# Patient Record
Sex: Female | Born: 1945
Health system: Southern US, Community
[De-identification: ages and names within clinical notes are randomized; demographics above are authoritative.]

## PROBLEM LIST (undated history)

## (undated) DIAGNOSIS — M51369 Other intervertebral disc degeneration, lumbar region without mention of lumbar back pain or lower extremity pain: Secondary | ICD-10-CM

## (undated) DIAGNOSIS — G8929 Other chronic pain: Secondary | ICD-10-CM

## (undated) DIAGNOSIS — E079 Disorder of thyroid, unspecified: Secondary | ICD-10-CM

## (undated) DIAGNOSIS — IMO0002 Reserved for concepts with insufficient information to code with codable children: Secondary | ICD-10-CM

## (undated) DIAGNOSIS — M25561 Pain in right knee: Secondary | ICD-10-CM

## (undated) DIAGNOSIS — M549 Dorsalgia, unspecified: Secondary | ICD-10-CM

## (undated) DIAGNOSIS — R269 Unspecified abnormalities of gait and mobility: Secondary | ICD-10-CM

## (undated) DIAGNOSIS — C50919 Malignant neoplasm of unspecified site of unspecified female breast: Secondary | ICD-10-CM

## (undated) DIAGNOSIS — G20A1 Parkinson's disease without dyskinesia, without mention of fluctuations: Secondary | ICD-10-CM

## (undated) DIAGNOSIS — M79606 Pain in leg, unspecified: Secondary | ICD-10-CM

## (undated) DIAGNOSIS — M5136 Other intervertebral disc degeneration, lumbar region: Secondary | ICD-10-CM

## (undated) DIAGNOSIS — E039 Hypothyroidism, unspecified: Secondary | ICD-10-CM

## (undated) DIAGNOSIS — M5030 Other cervical disc degeneration, unspecified cervical region: Secondary | ICD-10-CM

## (undated) DIAGNOSIS — K219 Gastro-esophageal reflux disease without esophagitis: Secondary | ICD-10-CM

## (undated) DIAGNOSIS — D72819 Decreased white blood cell count, unspecified: Secondary | ICD-10-CM

## (undated) DIAGNOSIS — G20 Parkinson's disease: Secondary | ICD-10-CM

## (undated) DIAGNOSIS — I10 Essential (primary) hypertension: Secondary | ICD-10-CM

## (undated) HISTORY — DX: Malignant neoplasm of unspecified site of unspecified female breast: C50.919

## (undated) HISTORY — DX: Reserved for concepts with insufficient information to code with codable children: IMO0002

## (undated) HISTORY — PX: CHOLECYSTECTOMY: SHX55

## (undated) HISTORY — PX: TUBAL LIGATION: SHX77

## (undated) HISTORY — DX: Disorder of thyroid, unspecified: E07.9

## (undated) HISTORY — DX: Gastro-esophageal reflux disease without esophagitis: K21.9

## (undated) HISTORY — PX: ABDOMINAL HYSTERECTOMY: SHX81

## (undated) HISTORY — DX: Essential (primary) hypertension: I10

---

## 2001-01-22 ENCOUNTER — Ambulatory Visit (HOSPITAL_COMMUNITY): Admission: RE | Admit: 2001-01-22 | Discharge: 2001-01-22 | Payer: Self-pay | Admitting: Endocrinology

## 2001-01-28 ENCOUNTER — Ambulatory Visit (HOSPITAL_COMMUNITY): Admission: RE | Admit: 2001-01-28 | Discharge: 2001-01-28 | Payer: Self-pay | Admitting: Endocrinology

## 2001-09-04 ENCOUNTER — Ambulatory Visit (HOSPITAL_COMMUNITY): Admission: RE | Admit: 2001-09-04 | Discharge: 2001-09-04 | Payer: Self-pay | Admitting: Endocrinology

## 2001-11-20 ENCOUNTER — Ambulatory Visit (HOSPITAL_COMMUNITY): Admission: RE | Admit: 2001-11-20 | Discharge: 2001-11-20 | Payer: Self-pay | Admitting: Endocrinology

## 2004-04-27 ENCOUNTER — Ambulatory Visit: Payer: Self-pay | Admitting: Internal Medicine

## 2004-04-27 ENCOUNTER — Ambulatory Visit (HOSPITAL_COMMUNITY): Admission: RE | Admit: 2004-04-27 | Discharge: 2004-04-27 | Payer: Self-pay | Admitting: Internal Medicine

## 2005-03-28 ENCOUNTER — Ambulatory Visit (HOSPITAL_COMMUNITY): Admission: RE | Admit: 2005-03-28 | Discharge: 2005-03-28 | Payer: Self-pay | Admitting: Oncology

## 2005-03-28 ENCOUNTER — Ambulatory Visit: Payer: Self-pay | Admitting: Oncology

## 2006-08-10 ENCOUNTER — Emergency Department (HOSPITAL_COMMUNITY): Admission: EM | Admit: 2006-08-10 | Discharge: 2006-08-10 | Payer: Self-pay | Admitting: Emergency Medicine

## 2011-08-21 DIAGNOSIS — E039 Hypothyroidism, unspecified: Secondary | ICD-10-CM | POA: Diagnosis not present

## 2011-08-29 DIAGNOSIS — K5909 Other constipation: Secondary | ICD-10-CM | POA: Diagnosis not present

## 2011-08-29 DIAGNOSIS — N3000 Acute cystitis without hematuria: Secondary | ICD-10-CM | POA: Diagnosis not present

## 2011-08-29 DIAGNOSIS — E039 Hypothyroidism, unspecified: Secondary | ICD-10-CM | POA: Diagnosis not present

## 2011-08-29 DIAGNOSIS — N3090 Cystitis, unspecified without hematuria: Secondary | ICD-10-CM | POA: Diagnosis not present

## 2011-08-29 DIAGNOSIS — I10 Essential (primary) hypertension: Secondary | ICD-10-CM | POA: Diagnosis not present

## 2011-08-29 DIAGNOSIS — M1990 Unspecified osteoarthritis, unspecified site: Secondary | ICD-10-CM | POA: Diagnosis not present

## 2011-10-15 DIAGNOSIS — H31019 Macula scars of posterior pole (postinflammatory) (post-traumatic), unspecified eye: Secondary | ICD-10-CM | POA: Diagnosis not present

## 2011-11-16 DIAGNOSIS — C50919 Malignant neoplasm of unspecified site of unspecified female breast: Secondary | ICD-10-CM

## 2011-11-16 HISTORY — DX: Malignant neoplasm of unspecified site of unspecified female breast: C50.919

## 2011-12-16 HISTORY — PX: BREAST LUMPECTOMY: SHX2

## 2011-12-18 DIAGNOSIS — B353 Tinea pedis: Secondary | ICD-10-CM | POA: Diagnosis not present

## 2011-12-18 DIAGNOSIS — B351 Tinea unguium: Secondary | ICD-10-CM | POA: Diagnosis not present

## 2011-12-24 DIAGNOSIS — C801 Malignant (primary) neoplasm, unspecified: Secondary | ICD-10-CM | POA: Diagnosis not present

## 2011-12-24 DIAGNOSIS — C50919 Malignant neoplasm of unspecified site of unspecified female breast: Secondary | ICD-10-CM | POA: Diagnosis not present

## 2011-12-24 DIAGNOSIS — R928 Other abnormal and inconclusive findings on diagnostic imaging of breast: Secondary | ICD-10-CM | POA: Diagnosis not present

## 2011-12-24 DIAGNOSIS — D0590 Unspecified type of carcinoma in situ of unspecified breast: Secondary | ICD-10-CM | POA: Diagnosis not present

## 2011-12-24 DIAGNOSIS — N63 Unspecified lump in unspecified breast: Secondary | ICD-10-CM | POA: Diagnosis not present

## 2012-01-06 DIAGNOSIS — C50919 Malignant neoplasm of unspecified site of unspecified female breast: Secondary | ICD-10-CM | POA: Diagnosis not present

## 2012-01-07 DIAGNOSIS — C801 Malignant (primary) neoplasm, unspecified: Secondary | ICD-10-CM | POA: Diagnosis not present

## 2012-01-07 DIAGNOSIS — C50919 Malignant neoplasm of unspecified site of unspecified female breast: Secondary | ICD-10-CM | POA: Diagnosis not present

## 2012-01-07 DIAGNOSIS — D0590 Unspecified type of carcinoma in situ of unspecified breast: Secondary | ICD-10-CM | POA: Diagnosis not present

## 2012-01-07 DIAGNOSIS — I10 Essential (primary) hypertension: Secondary | ICD-10-CM | POA: Diagnosis not present

## 2012-01-07 DIAGNOSIS — K219 Gastro-esophageal reflux disease without esophagitis: Secondary | ICD-10-CM | POA: Diagnosis not present

## 2012-01-31 DIAGNOSIS — N390 Urinary tract infection, site not specified: Secondary | ICD-10-CM | POA: Diagnosis not present

## 2012-01-31 DIAGNOSIS — R0789 Other chest pain: Secondary | ICD-10-CM | POA: Diagnosis not present

## 2012-01-31 DIAGNOSIS — E039 Hypothyroidism, unspecified: Secondary | ICD-10-CM | POA: Diagnosis not present

## 2012-01-31 DIAGNOSIS — R079 Chest pain, unspecified: Secondary | ICD-10-CM

## 2012-01-31 DIAGNOSIS — Z87891 Personal history of nicotine dependence: Secondary | ICD-10-CM | POA: Diagnosis not present

## 2012-01-31 DIAGNOSIS — R0989 Other specified symptoms and signs involving the circulatory and respiratory systems: Secondary | ICD-10-CM | POA: Diagnosis not present

## 2012-01-31 DIAGNOSIS — C50919 Malignant neoplasm of unspecified site of unspecified female breast: Secondary | ICD-10-CM | POA: Diagnosis not present

## 2012-01-31 DIAGNOSIS — R109 Unspecified abdominal pain: Secondary | ICD-10-CM | POA: Diagnosis not present

## 2012-01-31 DIAGNOSIS — I10 Essential (primary) hypertension: Secondary | ICD-10-CM | POA: Diagnosis not present

## 2012-01-31 DIAGNOSIS — R112 Nausea with vomiting, unspecified: Secondary | ICD-10-CM | POA: Diagnosis not present

## 2012-01-31 DIAGNOSIS — J811 Chronic pulmonary edema: Secondary | ICD-10-CM | POA: Diagnosis not present

## 2012-01-31 DIAGNOSIS — K219 Gastro-esophageal reflux disease without esophagitis: Secondary | ICD-10-CM | POA: Diagnosis not present

## 2012-01-31 DIAGNOSIS — Z79899 Other long term (current) drug therapy: Secondary | ICD-10-CM | POA: Diagnosis not present

## 2012-02-01 DIAGNOSIS — R079 Chest pain, unspecified: Secondary | ICD-10-CM | POA: Diagnosis not present

## 2012-02-07 DIAGNOSIS — R079 Chest pain, unspecified: Secondary | ICD-10-CM | POA: Diagnosis not present

## 2012-02-07 DIAGNOSIS — R072 Precordial pain: Secondary | ICD-10-CM | POA: Diagnosis not present

## 2012-02-12 DIAGNOSIS — R0789 Other chest pain: Secondary | ICD-10-CM | POA: Diagnosis not present

## 2012-02-14 ENCOUNTER — Encounter: Payer: Medicare Other | Admitting: Hematology and Oncology

## 2012-02-14 DIAGNOSIS — E039 Hypothyroidism, unspecified: Secondary | ICD-10-CM | POA: Diagnosis not present

## 2012-02-14 DIAGNOSIS — C50919 Malignant neoplasm of unspecified site of unspecified female breast: Secondary | ICD-10-CM | POA: Diagnosis not present

## 2012-02-14 DIAGNOSIS — I10 Essential (primary) hypertension: Secondary | ICD-10-CM | POA: Diagnosis not present

## 2012-02-19 DIAGNOSIS — I10 Essential (primary) hypertension: Secondary | ICD-10-CM | POA: Diagnosis not present

## 2012-02-19 DIAGNOSIS — E039 Hypothyroidism, unspecified: Secondary | ICD-10-CM | POA: Diagnosis not present

## 2012-02-20 DIAGNOSIS — Z87891 Personal history of nicotine dependence: Secondary | ICD-10-CM | POA: Diagnosis not present

## 2012-02-20 DIAGNOSIS — Z13820 Encounter for screening for osteoporosis: Secondary | ICD-10-CM | POA: Diagnosis not present

## 2012-02-20 DIAGNOSIS — Z803 Family history of malignant neoplasm of breast: Secondary | ICD-10-CM | POA: Diagnosis not present

## 2012-02-20 DIAGNOSIS — C50219 Malignant neoplasm of upper-inner quadrant of unspecified female breast: Secondary | ICD-10-CM | POA: Diagnosis not present

## 2012-02-20 DIAGNOSIS — Z170 Estrogen receptor positive status [ER+]: Secondary | ICD-10-CM | POA: Diagnosis not present

## 2012-02-20 DIAGNOSIS — Z510 Encounter for antineoplastic radiation therapy: Secondary | ICD-10-CM | POA: Diagnosis not present

## 2012-02-20 DIAGNOSIS — Z800 Family history of malignant neoplasm of digestive organs: Secondary | ICD-10-CM | POA: Diagnosis not present

## 2012-02-20 DIAGNOSIS — E079 Disorder of thyroid, unspecified: Secondary | ICD-10-CM | POA: Diagnosis not present

## 2012-02-20 DIAGNOSIS — I10 Essential (primary) hypertension: Secondary | ICD-10-CM | POA: Diagnosis not present

## 2012-02-20 DIAGNOSIS — Z79899 Other long term (current) drug therapy: Secondary | ICD-10-CM | POA: Diagnosis not present

## 2012-02-20 DIAGNOSIS — Z780 Asymptomatic menopausal state: Secondary | ICD-10-CM | POA: Diagnosis not present

## 2012-02-20 DIAGNOSIS — Z9889 Other specified postprocedural states: Secondary | ICD-10-CM | POA: Diagnosis not present

## 2012-02-24 DIAGNOSIS — I10 Essential (primary) hypertension: Secondary | ICD-10-CM | POA: Diagnosis not present

## 2012-02-24 DIAGNOSIS — Z780 Asymptomatic menopausal state: Secondary | ICD-10-CM | POA: Diagnosis not present

## 2012-02-24 DIAGNOSIS — C50219 Malignant neoplasm of upper-inner quadrant of unspecified female breast: Secondary | ICD-10-CM | POA: Diagnosis not present

## 2012-02-24 DIAGNOSIS — Z170 Estrogen receptor positive status [ER+]: Secondary | ICD-10-CM | POA: Diagnosis not present

## 2012-02-24 DIAGNOSIS — Z510 Encounter for antineoplastic radiation therapy: Secondary | ICD-10-CM | POA: Diagnosis not present

## 2012-02-24 DIAGNOSIS — Z13820 Encounter for screening for osteoporosis: Secondary | ICD-10-CM | POA: Diagnosis not present

## 2012-02-28 DIAGNOSIS — C50919 Malignant neoplasm of unspecified site of unspecified female breast: Secondary | ICD-10-CM | POA: Diagnosis not present

## 2012-02-28 DIAGNOSIS — I10 Essential (primary) hypertension: Secondary | ICD-10-CM | POA: Diagnosis not present

## 2012-02-28 DIAGNOSIS — Z23 Encounter for immunization: Secondary | ICD-10-CM | POA: Diagnosis not present

## 2012-02-28 DIAGNOSIS — M545 Low back pain: Secondary | ICD-10-CM | POA: Diagnosis not present

## 2012-02-28 DIAGNOSIS — K5909 Other constipation: Secondary | ICD-10-CM | POA: Diagnosis not present

## 2012-02-28 DIAGNOSIS — E039 Hypothyroidism, unspecified: Secondary | ICD-10-CM | POA: Diagnosis not present

## 2012-02-28 DIAGNOSIS — R0789 Other chest pain: Secondary | ICD-10-CM | POA: Diagnosis not present

## 2012-03-02 DIAGNOSIS — Z13820 Encounter for screening for osteoporosis: Secondary | ICD-10-CM | POA: Diagnosis not present

## 2012-03-02 DIAGNOSIS — Z510 Encounter for antineoplastic radiation therapy: Secondary | ICD-10-CM | POA: Diagnosis not present

## 2012-03-02 DIAGNOSIS — C50219 Malignant neoplasm of upper-inner quadrant of unspecified female breast: Secondary | ICD-10-CM | POA: Diagnosis not present

## 2012-03-02 DIAGNOSIS — I10 Essential (primary) hypertension: Secondary | ICD-10-CM | POA: Diagnosis not present

## 2012-03-02 DIAGNOSIS — Z780 Asymptomatic menopausal state: Secondary | ICD-10-CM | POA: Diagnosis not present

## 2012-03-02 DIAGNOSIS — Z170 Estrogen receptor positive status [ER+]: Secondary | ICD-10-CM | POA: Diagnosis not present

## 2012-03-09 DIAGNOSIS — C50219 Malignant neoplasm of upper-inner quadrant of unspecified female breast: Secondary | ICD-10-CM | POA: Diagnosis not present

## 2012-03-09 DIAGNOSIS — Z780 Asymptomatic menopausal state: Secondary | ICD-10-CM | POA: Diagnosis not present

## 2012-03-09 DIAGNOSIS — Z170 Estrogen receptor positive status [ER+]: Secondary | ICD-10-CM | POA: Diagnosis not present

## 2012-03-09 DIAGNOSIS — I10 Essential (primary) hypertension: Secondary | ICD-10-CM | POA: Diagnosis not present

## 2012-03-09 DIAGNOSIS — Z510 Encounter for antineoplastic radiation therapy: Secondary | ICD-10-CM | POA: Diagnosis not present

## 2012-03-09 DIAGNOSIS — Z13820 Encounter for screening for osteoporosis: Secondary | ICD-10-CM | POA: Diagnosis not present

## 2012-03-10 ENCOUNTER — Encounter: Payer: Medicare Other | Admitting: Hematology and Oncology

## 2012-03-10 DIAGNOSIS — E039 Hypothyroidism, unspecified: Secondary | ICD-10-CM

## 2012-03-10 DIAGNOSIS — I10 Essential (primary) hypertension: Secondary | ICD-10-CM | POA: Diagnosis not present

## 2012-03-10 DIAGNOSIS — Z170 Estrogen receptor positive status [ER+]: Secondary | ICD-10-CM | POA: Diagnosis not present

## 2012-03-10 DIAGNOSIS — Z510 Encounter for antineoplastic radiation therapy: Secondary | ICD-10-CM | POA: Diagnosis not present

## 2012-03-10 DIAGNOSIS — C50919 Malignant neoplasm of unspecified site of unspecified female breast: Secondary | ICD-10-CM | POA: Diagnosis not present

## 2012-03-10 DIAGNOSIS — Z780 Asymptomatic menopausal state: Secondary | ICD-10-CM | POA: Diagnosis not present

## 2012-03-10 DIAGNOSIS — C50219 Malignant neoplasm of upper-inner quadrant of unspecified female breast: Secondary | ICD-10-CM | POA: Diagnosis not present

## 2012-03-10 DIAGNOSIS — Z13820 Encounter for screening for osteoporosis: Secondary | ICD-10-CM | POA: Diagnosis not present

## 2012-03-11 DIAGNOSIS — Z170 Estrogen receptor positive status [ER+]: Secondary | ICD-10-CM | POA: Diagnosis not present

## 2012-03-11 DIAGNOSIS — I10 Essential (primary) hypertension: Secondary | ICD-10-CM | POA: Diagnosis not present

## 2012-03-11 DIAGNOSIS — Z510 Encounter for antineoplastic radiation therapy: Secondary | ICD-10-CM | POA: Diagnosis not present

## 2012-03-11 DIAGNOSIS — Z780 Asymptomatic menopausal state: Secondary | ICD-10-CM | POA: Diagnosis not present

## 2012-03-11 DIAGNOSIS — Z13820 Encounter for screening for osteoporosis: Secondary | ICD-10-CM | POA: Diagnosis not present

## 2012-03-11 DIAGNOSIS — C50219 Malignant neoplasm of upper-inner quadrant of unspecified female breast: Secondary | ICD-10-CM | POA: Diagnosis not present

## 2012-03-12 DIAGNOSIS — Z780 Asymptomatic menopausal state: Secondary | ICD-10-CM | POA: Diagnosis not present

## 2012-03-12 DIAGNOSIS — C50219 Malignant neoplasm of upper-inner quadrant of unspecified female breast: Secondary | ICD-10-CM | POA: Diagnosis not present

## 2012-03-12 DIAGNOSIS — I10 Essential (primary) hypertension: Secondary | ICD-10-CM | POA: Diagnosis not present

## 2012-03-12 DIAGNOSIS — Z13820 Encounter for screening for osteoporosis: Secondary | ICD-10-CM | POA: Diagnosis not present

## 2012-03-12 DIAGNOSIS — Z510 Encounter for antineoplastic radiation therapy: Secondary | ICD-10-CM | POA: Diagnosis not present

## 2012-03-12 DIAGNOSIS — Z170 Estrogen receptor positive status [ER+]: Secondary | ICD-10-CM | POA: Diagnosis not present

## 2012-03-13 DIAGNOSIS — C50219 Malignant neoplasm of upper-inner quadrant of unspecified female breast: Secondary | ICD-10-CM | POA: Diagnosis not present

## 2012-03-13 DIAGNOSIS — I10 Essential (primary) hypertension: Secondary | ICD-10-CM | POA: Diagnosis not present

## 2012-03-13 DIAGNOSIS — Z170 Estrogen receptor positive status [ER+]: Secondary | ICD-10-CM | POA: Diagnosis not present

## 2012-03-13 DIAGNOSIS — Z780 Asymptomatic menopausal state: Secondary | ICD-10-CM | POA: Diagnosis not present

## 2012-03-13 DIAGNOSIS — Z13820 Encounter for screening for osteoporosis: Secondary | ICD-10-CM | POA: Diagnosis not present

## 2012-03-13 DIAGNOSIS — Z510 Encounter for antineoplastic radiation therapy: Secondary | ICD-10-CM | POA: Diagnosis not present

## 2012-03-16 DIAGNOSIS — Z13820 Encounter for screening for osteoporosis: Secondary | ICD-10-CM | POA: Diagnosis not present

## 2012-03-16 DIAGNOSIS — Z510 Encounter for antineoplastic radiation therapy: Secondary | ICD-10-CM | POA: Diagnosis not present

## 2012-03-16 DIAGNOSIS — I10 Essential (primary) hypertension: Secondary | ICD-10-CM | POA: Diagnosis not present

## 2012-03-16 DIAGNOSIS — C50219 Malignant neoplasm of upper-inner quadrant of unspecified female breast: Secondary | ICD-10-CM | POA: Diagnosis not present

## 2012-03-16 DIAGNOSIS — Z170 Estrogen receptor positive status [ER+]: Secondary | ICD-10-CM | POA: Diagnosis not present

## 2012-03-16 DIAGNOSIS — Z780 Asymptomatic menopausal state: Secondary | ICD-10-CM | POA: Diagnosis not present

## 2012-03-17 DIAGNOSIS — Z510 Encounter for antineoplastic radiation therapy: Secondary | ICD-10-CM | POA: Diagnosis not present

## 2012-03-17 DIAGNOSIS — C50219 Malignant neoplasm of upper-inner quadrant of unspecified female breast: Secondary | ICD-10-CM | POA: Diagnosis not present

## 2012-03-25 DIAGNOSIS — C50219 Malignant neoplasm of upper-inner quadrant of unspecified female breast: Secondary | ICD-10-CM | POA: Diagnosis not present

## 2012-04-01 DIAGNOSIS — C50219 Malignant neoplasm of upper-inner quadrant of unspecified female breast: Secondary | ICD-10-CM | POA: Diagnosis not present

## 2012-04-08 DIAGNOSIS — C50219 Malignant neoplasm of upper-inner quadrant of unspecified female breast: Secondary | ICD-10-CM | POA: Diagnosis not present

## 2012-04-16 DIAGNOSIS — C50219 Malignant neoplasm of upper-inner quadrant of unspecified female breast: Secondary | ICD-10-CM | POA: Diagnosis not present

## 2012-04-17 DIAGNOSIS — R42 Dizziness and giddiness: Secondary | ICD-10-CM | POA: Diagnosis not present

## 2012-04-17 DIAGNOSIS — Z510 Encounter for antineoplastic radiation therapy: Secondary | ICD-10-CM | POA: Diagnosis not present

## 2012-04-17 DIAGNOSIS — I10 Essential (primary) hypertension: Secondary | ICD-10-CM | POA: Diagnosis not present

## 2012-04-17 DIAGNOSIS — E079 Disorder of thyroid, unspecified: Secondary | ICD-10-CM | POA: Diagnosis not present

## 2012-04-17 DIAGNOSIS — IMO0001 Reserved for inherently not codable concepts without codable children: Secondary | ICD-10-CM

## 2012-04-17 DIAGNOSIS — Z170 Estrogen receptor positive status [ER+]: Secondary | ICD-10-CM | POA: Diagnosis not present

## 2012-04-17 DIAGNOSIS — C50219 Malignant neoplasm of upper-inner quadrant of unspecified female breast: Secondary | ICD-10-CM | POA: Diagnosis not present

## 2012-04-17 HISTORY — DX: Reserved for inherently not codable concepts without codable children: IMO0001

## 2012-04-20 DIAGNOSIS — E079 Disorder of thyroid, unspecified: Secondary | ICD-10-CM | POA: Diagnosis not present

## 2012-04-20 DIAGNOSIS — C50219 Malignant neoplasm of upper-inner quadrant of unspecified female breast: Secondary | ICD-10-CM | POA: Diagnosis not present

## 2012-04-20 DIAGNOSIS — I10 Essential (primary) hypertension: Secondary | ICD-10-CM | POA: Diagnosis not present

## 2012-04-20 DIAGNOSIS — Z510 Encounter for antineoplastic radiation therapy: Secondary | ICD-10-CM | POA: Diagnosis not present

## 2012-04-20 DIAGNOSIS — R42 Dizziness and giddiness: Secondary | ICD-10-CM | POA: Diagnosis not present

## 2012-04-20 DIAGNOSIS — Z170 Estrogen receptor positive status [ER+]: Secondary | ICD-10-CM | POA: Diagnosis not present

## 2012-04-21 DIAGNOSIS — R42 Dizziness and giddiness: Secondary | ICD-10-CM | POA: Diagnosis not present

## 2012-04-21 DIAGNOSIS — Z170 Estrogen receptor positive status [ER+]: Secondary | ICD-10-CM | POA: Diagnosis not present

## 2012-04-21 DIAGNOSIS — C50219 Malignant neoplasm of upper-inner quadrant of unspecified female breast: Secondary | ICD-10-CM | POA: Diagnosis not present

## 2012-04-21 DIAGNOSIS — E079 Disorder of thyroid, unspecified: Secondary | ICD-10-CM | POA: Diagnosis not present

## 2012-04-21 DIAGNOSIS — Z510 Encounter for antineoplastic radiation therapy: Secondary | ICD-10-CM | POA: Diagnosis not present

## 2012-04-21 DIAGNOSIS — I10 Essential (primary) hypertension: Secondary | ICD-10-CM | POA: Diagnosis not present

## 2012-04-22 DIAGNOSIS — Z170 Estrogen receptor positive status [ER+]: Secondary | ICD-10-CM | POA: Diagnosis not present

## 2012-04-22 DIAGNOSIS — E079 Disorder of thyroid, unspecified: Secondary | ICD-10-CM | POA: Diagnosis not present

## 2012-04-22 DIAGNOSIS — Z510 Encounter for antineoplastic radiation therapy: Secondary | ICD-10-CM | POA: Diagnosis not present

## 2012-04-22 DIAGNOSIS — I10 Essential (primary) hypertension: Secondary | ICD-10-CM | POA: Diagnosis not present

## 2012-04-22 DIAGNOSIS — R42 Dizziness and giddiness: Secondary | ICD-10-CM | POA: Diagnosis not present

## 2012-04-22 DIAGNOSIS — C50219 Malignant neoplasm of upper-inner quadrant of unspecified female breast: Secondary | ICD-10-CM | POA: Diagnosis not present

## 2012-04-23 DIAGNOSIS — Z510 Encounter for antineoplastic radiation therapy: Secondary | ICD-10-CM | POA: Diagnosis not present

## 2012-04-23 DIAGNOSIS — E079 Disorder of thyroid, unspecified: Secondary | ICD-10-CM | POA: Diagnosis not present

## 2012-04-23 DIAGNOSIS — R42 Dizziness and giddiness: Secondary | ICD-10-CM | POA: Diagnosis not present

## 2012-04-23 DIAGNOSIS — Z170 Estrogen receptor positive status [ER+]: Secondary | ICD-10-CM | POA: Diagnosis not present

## 2012-04-23 DIAGNOSIS — C50219 Malignant neoplasm of upper-inner quadrant of unspecified female breast: Secondary | ICD-10-CM | POA: Diagnosis not present

## 2012-04-23 DIAGNOSIS — I10 Essential (primary) hypertension: Secondary | ICD-10-CM | POA: Diagnosis not present

## 2012-04-24 DIAGNOSIS — R42 Dizziness and giddiness: Secondary | ICD-10-CM | POA: Diagnosis not present

## 2012-04-24 DIAGNOSIS — Z170 Estrogen receptor positive status [ER+]: Secondary | ICD-10-CM | POA: Diagnosis not present

## 2012-04-24 DIAGNOSIS — C50219 Malignant neoplasm of upper-inner quadrant of unspecified female breast: Secondary | ICD-10-CM | POA: Diagnosis not present

## 2012-04-24 DIAGNOSIS — E079 Disorder of thyroid, unspecified: Secondary | ICD-10-CM | POA: Diagnosis not present

## 2012-04-24 DIAGNOSIS — Z510 Encounter for antineoplastic radiation therapy: Secondary | ICD-10-CM | POA: Diagnosis not present

## 2012-04-24 DIAGNOSIS — I10 Essential (primary) hypertension: Secondary | ICD-10-CM | POA: Diagnosis not present

## 2012-04-27 DIAGNOSIS — Z510 Encounter for antineoplastic radiation therapy: Secondary | ICD-10-CM | POA: Diagnosis not present

## 2012-04-27 DIAGNOSIS — E079 Disorder of thyroid, unspecified: Secondary | ICD-10-CM | POA: Diagnosis not present

## 2012-04-27 DIAGNOSIS — R42 Dizziness and giddiness: Secondary | ICD-10-CM | POA: Diagnosis not present

## 2012-04-27 DIAGNOSIS — Z170 Estrogen receptor positive status [ER+]: Secondary | ICD-10-CM | POA: Diagnosis not present

## 2012-04-27 DIAGNOSIS — C50219 Malignant neoplasm of upper-inner quadrant of unspecified female breast: Secondary | ICD-10-CM | POA: Diagnosis not present

## 2012-04-27 DIAGNOSIS — I10 Essential (primary) hypertension: Secondary | ICD-10-CM | POA: Diagnosis not present

## 2012-05-29 DIAGNOSIS — I10 Essential (primary) hypertension: Secondary | ICD-10-CM | POA: Diagnosis not present

## 2012-05-29 DIAGNOSIS — Z923 Personal history of irradiation: Secondary | ICD-10-CM | POA: Diagnosis not present

## 2012-05-29 DIAGNOSIS — C50219 Malignant neoplasm of upper-inner quadrant of unspecified female breast: Secondary | ICD-10-CM | POA: Diagnosis not present

## 2012-05-29 DIAGNOSIS — E079 Disorder of thyroid, unspecified: Secondary | ICD-10-CM | POA: Diagnosis not present

## 2012-06-16 DIAGNOSIS — Z170 Estrogen receptor positive status [ER+]: Secondary | ICD-10-CM | POA: Diagnosis not present

## 2012-06-16 DIAGNOSIS — C50919 Malignant neoplasm of unspecified site of unspecified female breast: Secondary | ICD-10-CM | POA: Diagnosis not present

## 2012-06-16 DIAGNOSIS — G4700 Insomnia, unspecified: Secondary | ICD-10-CM | POA: Diagnosis not present

## 2012-07-01 DIAGNOSIS — C50919 Malignant neoplasm of unspecified site of unspecified female breast: Secondary | ICD-10-CM | POA: Diagnosis not present

## 2012-08-01 DIAGNOSIS — M542 Cervicalgia: Secondary | ICD-10-CM | POA: Diagnosis not present

## 2012-08-01 DIAGNOSIS — S139XXA Sprain of joints and ligaments of unspecified parts of neck, initial encounter: Secondary | ICD-10-CM | POA: Diagnosis not present

## 2012-08-01 DIAGNOSIS — I10 Essential (primary) hypertension: Secondary | ICD-10-CM | POA: Diagnosis not present

## 2012-08-01 DIAGNOSIS — R1110 Vomiting, unspecified: Secondary | ICD-10-CM | POA: Diagnosis not present

## 2012-08-01 DIAGNOSIS — Z853 Personal history of malignant neoplasm of breast: Secondary | ICD-10-CM | POA: Diagnosis not present

## 2012-08-01 DIAGNOSIS — Z79899 Other long term (current) drug therapy: Secondary | ICD-10-CM | POA: Diagnosis not present

## 2012-08-01 DIAGNOSIS — R51 Headache: Secondary | ICD-10-CM | POA: Diagnosis not present

## 2012-08-02 DIAGNOSIS — M542 Cervicalgia: Secondary | ICD-10-CM | POA: Diagnosis not present

## 2012-08-02 DIAGNOSIS — R51 Headache: Secondary | ICD-10-CM | POA: Diagnosis not present

## 2012-08-11 DIAGNOSIS — S139XXA Sprain of joints and ligaments of unspecified parts of neck, initial encounter: Secondary | ICD-10-CM | POA: Diagnosis not present

## 2012-08-20 DIAGNOSIS — E039 Hypothyroidism, unspecified: Secondary | ICD-10-CM | POA: Diagnosis not present

## 2012-08-20 DIAGNOSIS — I10 Essential (primary) hypertension: Secondary | ICD-10-CM | POA: Diagnosis not present

## 2012-08-25 DIAGNOSIS — E039 Hypothyroidism, unspecified: Secondary | ICD-10-CM | POA: Diagnosis not present

## 2012-08-25 DIAGNOSIS — I10 Essential (primary) hypertension: Secondary | ICD-10-CM | POA: Diagnosis not present

## 2012-08-25 DIAGNOSIS — K5909 Other constipation: Secondary | ICD-10-CM | POA: Diagnosis not present

## 2012-08-25 DIAGNOSIS — F411 Generalized anxiety disorder: Secondary | ICD-10-CM | POA: Diagnosis not present

## 2012-08-25 DIAGNOSIS — C50919 Malignant neoplasm of unspecified site of unspecified female breast: Secondary | ICD-10-CM | POA: Diagnosis not present

## 2012-09-30 DIAGNOSIS — C50919 Malignant neoplasm of unspecified site of unspecified female breast: Secondary | ICD-10-CM | POA: Diagnosis not present

## 2012-10-30 DIAGNOSIS — M25519 Pain in unspecified shoulder: Secondary | ICD-10-CM | POA: Diagnosis not present

## 2012-10-30 DIAGNOSIS — I10 Essential (primary) hypertension: Secondary | ICD-10-CM | POA: Diagnosis not present

## 2012-10-30 DIAGNOSIS — R259 Unspecified abnormal involuntary movements: Secondary | ICD-10-CM | POA: Diagnosis not present

## 2012-10-30 DIAGNOSIS — E079 Disorder of thyroid, unspecified: Secondary | ICD-10-CM | POA: Diagnosis not present

## 2012-10-30 DIAGNOSIS — C50219 Malignant neoplasm of upper-inner quadrant of unspecified female breast: Secondary | ICD-10-CM | POA: Diagnosis not present

## 2012-10-30 DIAGNOSIS — Z09 Encounter for follow-up examination after completed treatment for conditions other than malignant neoplasm: Secondary | ICD-10-CM | POA: Diagnosis not present

## 2012-10-30 DIAGNOSIS — Z853 Personal history of malignant neoplasm of breast: Secondary | ICD-10-CM | POA: Diagnosis not present

## 2012-12-29 DIAGNOSIS — R922 Inconclusive mammogram: Secondary | ICD-10-CM | POA: Diagnosis not present

## 2012-12-29 DIAGNOSIS — Z9889 Other specified postprocedural states: Secondary | ICD-10-CM | POA: Diagnosis not present

## 2012-12-29 DIAGNOSIS — C50919 Malignant neoplasm of unspecified site of unspecified female breast: Secondary | ICD-10-CM | POA: Diagnosis not present

## 2012-12-30 DIAGNOSIS — Z853 Personal history of malignant neoplasm of breast: Secondary | ICD-10-CM | POA: Diagnosis not present

## 2013-01-06 DIAGNOSIS — H40059 Ocular hypertension, unspecified eye: Secondary | ICD-10-CM | POA: Diagnosis not present

## 2013-01-20 DIAGNOSIS — C50919 Malignant neoplasm of unspecified site of unspecified female breast: Secondary | ICD-10-CM | POA: Diagnosis not present

## 2013-02-22 ENCOUNTER — Encounter (INDEPENDENT_AMBULATORY_CARE_PROVIDER_SITE_OTHER): Payer: Medicare Other

## 2013-02-22 DIAGNOSIS — C50919 Malignant neoplasm of unspecified site of unspecified female breast: Secondary | ICD-10-CM | POA: Diagnosis not present

## 2013-02-22 DIAGNOSIS — D72819 Decreased white blood cell count, unspecified: Secondary | ICD-10-CM

## 2013-02-22 DIAGNOSIS — M899 Disorder of bone, unspecified: Secondary | ICD-10-CM | POA: Diagnosis not present

## 2013-02-24 DIAGNOSIS — E039 Hypothyroidism, unspecified: Secondary | ICD-10-CM | POA: Diagnosis not present

## 2013-02-24 DIAGNOSIS — I10 Essential (primary) hypertension: Secondary | ICD-10-CM | POA: Diagnosis not present

## 2013-03-01 DIAGNOSIS — C50919 Malignant neoplasm of unspecified site of unspecified female breast: Secondary | ICD-10-CM | POA: Diagnosis not present

## 2013-03-01 DIAGNOSIS — D72819 Decreased white blood cell count, unspecified: Secondary | ICD-10-CM | POA: Diagnosis not present

## 2013-03-01 DIAGNOSIS — M899 Disorder of bone, unspecified: Secondary | ICD-10-CM | POA: Diagnosis not present

## 2013-03-03 DIAGNOSIS — E039 Hypothyroidism, unspecified: Secondary | ICD-10-CM | POA: Diagnosis not present

## 2013-03-03 DIAGNOSIS — Z23 Encounter for immunization: Secondary | ICD-10-CM | POA: Diagnosis not present

## 2013-03-03 DIAGNOSIS — G250 Essential tremor: Secondary | ICD-10-CM | POA: Diagnosis not present

## 2013-03-03 DIAGNOSIS — C50919 Malignant neoplasm of unspecified site of unspecified female breast: Secondary | ICD-10-CM | POA: Diagnosis not present

## 2013-03-03 DIAGNOSIS — K5909 Other constipation: Secondary | ICD-10-CM | POA: Diagnosis not present

## 2013-03-03 DIAGNOSIS — I10 Essential (primary) hypertension: Secondary | ICD-10-CM | POA: Diagnosis not present

## 2013-03-03 DIAGNOSIS — F411 Generalized anxiety disorder: Secondary | ICD-10-CM | POA: Diagnosis not present

## 2013-03-10 DIAGNOSIS — Z170 Estrogen receptor positive status [ER+]: Secondary | ICD-10-CM

## 2013-03-10 DIAGNOSIS — D72819 Decreased white blood cell count, unspecified: Secondary | ICD-10-CM | POA: Diagnosis not present

## 2013-03-10 DIAGNOSIS — C50919 Malignant neoplasm of unspecified site of unspecified female breast: Secondary | ICD-10-CM

## 2013-03-18 DIAGNOSIS — D72819 Decreased white blood cell count, unspecified: Secondary | ICD-10-CM | POA: Diagnosis not present

## 2013-03-18 DIAGNOSIS — Z79899 Other long term (current) drug therapy: Secondary | ICD-10-CM | POA: Diagnosis not present

## 2013-03-18 DIAGNOSIS — R634 Abnormal weight loss: Secondary | ICD-10-CM | POA: Diagnosis not present

## 2013-03-18 DIAGNOSIS — C50919 Malignant neoplasm of unspecified site of unspecified female breast: Secondary | ICD-10-CM | POA: Diagnosis not present

## 2013-03-18 DIAGNOSIS — Z1159 Encounter for screening for other viral diseases: Secondary | ICD-10-CM | POA: Diagnosis not present

## 2013-03-18 DIAGNOSIS — K869 Disease of pancreas, unspecified: Secondary | ICD-10-CM | POA: Diagnosis not present

## 2013-03-19 DIAGNOSIS — D72819 Decreased white blood cell count, unspecified: Secondary | ICD-10-CM

## 2013-03-19 DIAGNOSIS — K869 Disease of pancreas, unspecified: Secondary | ICD-10-CM

## 2013-03-19 DIAGNOSIS — C50919 Malignant neoplasm of unspecified site of unspecified female breast: Secondary | ICD-10-CM

## 2013-03-22 DIAGNOSIS — Z1159 Encounter for screening for other viral diseases: Secondary | ICD-10-CM | POA: Diagnosis not present

## 2013-03-22 DIAGNOSIS — K869 Disease of pancreas, unspecified: Secondary | ICD-10-CM | POA: Diagnosis not present

## 2013-03-22 DIAGNOSIS — D72819 Decreased white blood cell count, unspecified: Secondary | ICD-10-CM | POA: Diagnosis not present

## 2013-03-22 DIAGNOSIS — Z79899 Other long term (current) drug therapy: Secondary | ICD-10-CM | POA: Diagnosis not present

## 2013-03-22 DIAGNOSIS — R634 Abnormal weight loss: Secondary | ICD-10-CM | POA: Diagnosis not present

## 2013-03-22 DIAGNOSIS — C50919 Malignant neoplasm of unspecified site of unspecified female breast: Secondary | ICD-10-CM | POA: Diagnosis not present

## 2013-03-24 DIAGNOSIS — Z1159 Encounter for screening for other viral diseases: Secondary | ICD-10-CM | POA: Diagnosis not present

## 2013-03-24 DIAGNOSIS — D72819 Decreased white blood cell count, unspecified: Secondary | ICD-10-CM | POA: Diagnosis not present

## 2013-03-24 DIAGNOSIS — R634 Abnormal weight loss: Secondary | ICD-10-CM | POA: Diagnosis not present

## 2013-03-24 DIAGNOSIS — K869 Disease of pancreas, unspecified: Secondary | ICD-10-CM | POA: Diagnosis not present

## 2013-03-24 DIAGNOSIS — Z79899 Other long term (current) drug therapy: Secondary | ICD-10-CM | POA: Diagnosis not present

## 2013-03-24 DIAGNOSIS — C50919 Malignant neoplasm of unspecified site of unspecified female breast: Secondary | ICD-10-CM | POA: Diagnosis not present

## 2013-03-31 DIAGNOSIS — R634 Abnormal weight loss: Secondary | ICD-10-CM | POA: Diagnosis not present

## 2013-03-31 DIAGNOSIS — Z79899 Other long term (current) drug therapy: Secondary | ICD-10-CM | POA: Diagnosis not present

## 2013-03-31 DIAGNOSIS — C50919 Malignant neoplasm of unspecified site of unspecified female breast: Secondary | ICD-10-CM | POA: Diagnosis not present

## 2013-03-31 DIAGNOSIS — K869 Disease of pancreas, unspecified: Secondary | ICD-10-CM | POA: Diagnosis not present

## 2013-03-31 DIAGNOSIS — Z1159 Encounter for screening for other viral diseases: Secondary | ICD-10-CM | POA: Diagnosis not present

## 2013-03-31 DIAGNOSIS — D72819 Decreased white blood cell count, unspecified: Secondary | ICD-10-CM | POA: Diagnosis not present

## 2013-04-06 DIAGNOSIS — D72819 Decreased white blood cell count, unspecified: Secondary | ICD-10-CM | POA: Diagnosis not present

## 2013-04-06 DIAGNOSIS — Z79899 Other long term (current) drug therapy: Secondary | ICD-10-CM | POA: Diagnosis not present

## 2013-04-06 DIAGNOSIS — K869 Disease of pancreas, unspecified: Secondary | ICD-10-CM | POA: Diagnosis not present

## 2013-04-06 DIAGNOSIS — C50919 Malignant neoplasm of unspecified site of unspecified female breast: Secondary | ICD-10-CM | POA: Diagnosis not present

## 2013-04-06 DIAGNOSIS — Z1159 Encounter for screening for other viral diseases: Secondary | ICD-10-CM | POA: Diagnosis not present

## 2013-04-06 DIAGNOSIS — R634 Abnormal weight loss: Secondary | ICD-10-CM | POA: Diagnosis not present

## 2013-04-08 DIAGNOSIS — R634 Abnormal weight loss: Secondary | ICD-10-CM | POA: Diagnosis not present

## 2013-04-08 DIAGNOSIS — Z1159 Encounter for screening for other viral diseases: Secondary | ICD-10-CM | POA: Diagnosis not present

## 2013-04-08 DIAGNOSIS — K869 Disease of pancreas, unspecified: Secondary | ICD-10-CM | POA: Diagnosis not present

## 2013-04-08 DIAGNOSIS — C50919 Malignant neoplasm of unspecified site of unspecified female breast: Secondary | ICD-10-CM | POA: Diagnosis not present

## 2013-04-08 DIAGNOSIS — Z79899 Other long term (current) drug therapy: Secondary | ICD-10-CM | POA: Diagnosis not present

## 2013-04-08 DIAGNOSIS — D72819 Decreased white blood cell count, unspecified: Secondary | ICD-10-CM | POA: Diagnosis not present

## 2013-04-21 ENCOUNTER — Encounter (INDEPENDENT_AMBULATORY_CARE_PROVIDER_SITE_OTHER): Payer: Self-pay | Admitting: *Deleted

## 2013-04-21 DIAGNOSIS — R5383 Other fatigue: Secondary | ICD-10-CM

## 2013-04-21 DIAGNOSIS — R5381 Other malaise: Secondary | ICD-10-CM | POA: Diagnosis not present

## 2013-04-21 DIAGNOSIS — D72819 Decreased white blood cell count, unspecified: Secondary | ICD-10-CM | POA: Diagnosis not present

## 2013-04-21 DIAGNOSIS — E559 Vitamin D deficiency, unspecified: Secondary | ICD-10-CM | POA: Diagnosis not present

## 2013-04-21 DIAGNOSIS — C50919 Malignant neoplasm of unspecified site of unspecified female breast: Secondary | ICD-10-CM | POA: Diagnosis not present

## 2013-04-22 ENCOUNTER — Encounter (INDEPENDENT_AMBULATORY_CARE_PROVIDER_SITE_OTHER): Payer: Self-pay

## 2013-04-29 ENCOUNTER — Other Ambulatory Visit (INDEPENDENT_AMBULATORY_CARE_PROVIDER_SITE_OTHER): Payer: Self-pay | Admitting: *Deleted

## 2013-04-29 ENCOUNTER — Telehealth (INDEPENDENT_AMBULATORY_CARE_PROVIDER_SITE_OTHER): Payer: Self-pay | Admitting: *Deleted

## 2013-04-29 DIAGNOSIS — Z1211 Encounter for screening for malignant neoplasm of colon: Secondary | ICD-10-CM

## 2013-04-29 DIAGNOSIS — Z86010 Personal history of colonic polyps: Secondary | ICD-10-CM

## 2013-04-29 DIAGNOSIS — Z800 Family history of malignant neoplasm of digestive organs: Secondary | ICD-10-CM

## 2013-04-29 MED ORDER — PEG-KCL-NACL-NASULF-NA ASC-C 100 G PO SOLR: 1.0000 | Freq: Once | ORAL | Status: DC

## 2013-04-29 NOTE — Telephone Encounter (Signed)
Patient needs movi prep 

## 2013-05-05 ENCOUNTER — Encounter (HOSPITAL_COMMUNITY): Payer: Self-pay

## 2013-05-05 ENCOUNTER — Encounter (HOSPITAL_COMMUNITY): Payer: Medicare Other | Attending: Hematology and Oncology

## 2013-05-05 VITALS — BP 158/90 | HR 66 | Temp 97.6°F | Resp 18 | Ht 61.0 in | Wt 149.8 lb

## 2013-05-05 DIAGNOSIS — C50919 Malignant neoplasm of unspecified site of unspecified female breast: Secondary | ICD-10-CM | POA: Diagnosis not present

## 2013-05-05 DIAGNOSIS — I10 Essential (primary) hypertension: Secondary | ICD-10-CM | POA: Diagnosis not present

## 2013-05-05 DIAGNOSIS — Z09 Encounter for follow-up examination after completed treatment for conditions other than malignant neoplasm: Secondary | ICD-10-CM | POA: Diagnosis not present

## 2013-05-05 DIAGNOSIS — E039 Hypothyroidism, unspecified: Secondary | ICD-10-CM | POA: Diagnosis not present

## 2013-05-05 DIAGNOSIS — Z853 Personal history of malignant neoplasm of breast: Secondary | ICD-10-CM | POA: Insufficient documentation

## 2013-05-05 LAB — CBC WITH DIFFERENTIAL/PLATELET
Basophils Relative: 1 % (ref 0–1)
Eosinophils Absolute: 0.1 10*3/uL (ref 0.0–0.7)
Eosinophils Relative: 4 % (ref 0–5)
HCT: 37.9 % (ref 36.0–46.0)
Hemoglobin: 12.6 g/dL (ref 12.0–15.0)
MCH: 28.8 pg (ref 26.0–34.0)
MCHC: 33.2 g/dL (ref 30.0–36.0)
MCV: 86.7 fL (ref 78.0–100.0)
Monocytes Absolute: 0.2 10*3/uL (ref 0.1–1.0)
Monocytes Relative: 7 % (ref 3–12)
Neutro Abs: 1.2 10*3/uL — ABNORMAL LOW (ref 1.7–7.7)
RDW: 12 % (ref 11.5–15.5)

## 2013-05-05 LAB — COMPREHENSIVE METABOLIC PANEL
AST: 24 U/L (ref 0–37)
Albumin: 4.6 g/dL (ref 3.5–5.2)
Alkaline Phosphatase: 107 U/L (ref 39–117)
BUN: 10 mg/dL (ref 6–23)
Calcium: 10.5 mg/dL (ref 8.4–10.5)
Chloride: 101 mEq/L (ref 96–112)
Creatinine, Ser: 0.81 mg/dL (ref 0.50–1.10)
GFR calc Af Amer: 85 mL/min — ABNORMAL LOW (ref >90)
Potassium: 3.6 mEq/L (ref 3.5–5.1)
Total Bilirubin: 0.4 mg/dL (ref 0.3–1.2)

## 2013-05-05 MED ORDER — ANASTROZOLE 1 MG PO TABS: 1.0000 mg | ORAL_TABLET | Freq: Every day | ORAL | Status: DC

## 2013-05-05 NOTE — Progress Notes (Signed)
[facility] Earl Lites A. Zigmund Daniel, M.D.  NEW PATIENT EVALUATION   Name: Lisa Torres Date: 05/05/2013 MRN: [id number] DOB: 03/28/1946  PCP: Estanislado Pandy, MD   REFERRING PHYSICIAN: No ref. provider found   REASON FOR REFERRAL: Followup of breast cancer originally diagnosed in July of 2013 per     HISTORY OF PRESENT ILLNESS:Lisa Torres is a 67 y.o. female transferring her care from [place] where she was receiving therapy with anastrozole for left-sided breast cancer, diagnosed on 01/07/2012, infiltrating duct cell carcinoma, 0.7 cm in size with associated noninvasive ductal neoplasia, ER and PR positive, HER-2/neu not over amplified, treated postoperatively after lumpectomy and sentinel node biopsy with radiotherapy ending in November of 2013 followed by treatment with anastrozole along with calcium and vitamin D supplements. She experiences minimal hot flashes and does have left knee discomfort. She denies any cough, wheezing, sore throat, fever, abdominal pain, nausea, vomiting, lower extremity swelling or redness, dysuria, hematuria, vaginal bleeding or discharge, skin rash, headache, or seizures. She had undergone a bone marrow biopsy for leukopenia with findings of 30% cellularity without any evidence of myelodysplasia, metastatic disease, or other primary bone marrow disorder.   PAST MEDICAL HISTORY:  has a past medical history of Breast cancer (11/2011); Radiation (04/2012); Hypertension; Thyroid disease; and GERD (gastroesophageal reflux disease).   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  PAST SURGICAL HISTORY: Past Surgical History  Procedure Laterality Date  . Breast lumpectomy Left 12/2011  . Abdominal hysterectomy      partial  . Cholecystectomy    . Tubal ligation     Bone marrow biopsy 04/08/2013 was negative flow cytometry.   CURRENT MEDICATIONS: has a current medication list which  includes the following prescription(s): amlodipine, anastrozole, calcium carbonate, vitamin d-3, levothyroxine, lorazepam, naproxen sodium, polyethylene glycol, ranitidine, and anastrozole.   ALLERGIES: Review of patient's allergies indicates no known allergies.   SOCIAL HISTORY:  reports that she has quit smoking. She has never used smokeless tobacco. She reports that she does not drink alcohol or use illicit drugs.   FAMILY HISTORY: family history includes Cancer in her father and mother.    REVIEW OF SYSTEMS:  Other than that discussed above is noncontributory.    PHYSICAL EXAM:  height is 5\' 1"  (1.549 m) and weight is 149 lb 12.8 oz (67.949 kg). Her oral temperature is 97.6 F (36.4 C). Her blood pressure is 158/90 and her pulse is 66. Her respiration is 18.    GENERAL:alert, no distress and comfortable SKIN: skin color, texture, turgor are normal, no rashes or significant lesions EYES: normal, Conjunctiva are pink and non-injected, sclera clear OROPHARYNX:no exudate, no erythema and lips, buccal mucosa, and tongue normal  NECK: supple, thyroid normal size, non-tender, without nodularity CHEST: Status post left breast lumpectomy with hyperpigmentation changes of radiation. No masses felt in either breast. LYMPH:  no palpable lymphadenopathy in the cervical, axillary or inguinal LUNGS: clear to auscultation and percussion with normal breathing effort HEART: regular rate & rhythm and no murmurs ABDOMEN:abdomen soft, non-tender and normal bowel sounds MUSCULOSKELETALl:no cyanosis of digits, no clubbing or edema  NEURO: alert & oriented x 3 with fluent speech, no focal motor/sensory deficits    LABORATORY DATA:  No results found for any previous visit. CBC, CMP, CA27-29, CEA pending  Urinalysis No results found for this basename: colorurine,  appearanceur,  labspec,  phurine,  glucoseu,  hgbur,  bilirubinur,  ketonesur,  proteinur,  urobilinogen,  nitrite,  leukocytesur       @RADIOGRAPHY : MRI the abdomen showed a pancreatic cleft within the pancreas of no clinical significance.  PATHOLOGY: Breast tumor: 0.7 cm invasive ductal carcinoma with noninvasive ductal neoplasia, ER/PR positive, HER-2/neu not over amplified, 01/07/2012.  Bone marrow aspiration and biopsy 04/08/2013 with 30% cellularity, adequate iron stores, negative flow cytometry.   IMPRESSION:  #1. Stage I left breast cancer, status post lumpectomy, sentinel node biopsy, external beam radiotherapy, currently tolerating anastrozole well. #2. Pancreatic cleft. #3. Chronic idiopathic cyclic neutropenia. #4. Hypothyroidism, on treatment. #5. Hypertension, controlled. #6. Gastroesophageal reflux disease, on treatment. #7. Degenerative joint disease, on treatment.  PLAN:  #1. Continue anastrozole 1 mg daily. #2. Continue self breast examination monthly. #3. Repeat mammogram in September 2015. #4 followup in 6 months with lab tests.   Maurilio Lovely, MD 05/05/2013 4:22 PM

## 2013-05-05 NOTE — Patient Instructions (Signed)
[facility] Discharge Instructions  RECOMMENDATIONS MADE BY THE CONSULTANT AND ANY TEST RESULTS WILL BE SENT TO YOUR REFERRING PHYSICIAN.  Lab work today. We will call you if there are any abnormal results. Mammogram due once per year (Sept 2015). A prescription for Anastrozole was given to you. Take as directed. Return to clinic in 6 months for follow-up.  Report any issues/concerns to clinic as needed prior to appointment.  Thank you for choosing [facility] to provide your oncology and hematology care.  To afford each patient quality time with our providers, please arrive at least 15 minutes before your scheduled appointment time.  With your help, our goal is to use those 15 minutes to complete the necessary work-up to ensure our physicians have the information they need to help with your evaluation and healthcare recommendations.    Effective January 1st, 2014, we ask that you re-schedule your appointment with our physicians should you arrive 10 or more minutes late for your appointment.  We strive to give you quality time with our providers, and arriving late affects you and other patients whose appointments are after yours.    Again, thank you for choosing [facility].  Our hope is that these requests will decrease the amount of time that you wait before being seen by our physicians.       _____________________________________________________________  Should you have questions after your visit to [facility], please contact our office at [phone] between the hours of 8:30 a.m. and 5:00 p.m.  Voicemails left after 4:30 p.m. will not be returned until the following business day.  For prescription refill requests, have your pharmacy contact our office with your prescription refill request.

## 2013-05-05 NOTE — Progress Notes (Signed)
Lisa Torres presented for labwork. Labs per MD order drawn via Peripheral Line 23 gauge needle inserted in right antecubital.  Good blood return present. Procedure without incident.  Needle removed intact. Patient tolerated procedure well.

## 2013-05-06 LAB — CEA: CEA: 0.7 ng/mL (ref 0.0–5.0)

## 2013-05-06 LAB — CANCER ANTIGEN 27.29: CA 27.29: 32 U/mL (ref 0–39)

## 2013-06-07 ENCOUNTER — Telehealth (INDEPENDENT_AMBULATORY_CARE_PROVIDER_SITE_OTHER): Payer: Self-pay | Admitting: *Deleted

## 2013-06-07 NOTE — Telephone Encounter (Signed)
  Procedure: tcs  Reason/Indication:  Hx polyps, fam hx colon ca  Has patient had this procedure before?  Yes, 2009 -- scanned  If so, when, by whom and where?    Is there a family history of colon cancer?  Yes, mother  Who?  What age when diagnosed?    Is patient diabetic?   no      Does patient have prosthetic heart valve?  no  Do you have a pacemaker?  no  Has patient ever had endocarditis? no  Has patient had joint replacement within last 12 months?  no  Does patient tend to be constipated or take laxatives? Yes, miralax  Is patient on Coumadin, Plavix and/or Aspirin? no  Medications: ranitidine 150 mg bid, amlodipine 5 mg daily, levothyroxine 88 mg daily, lorazepam 1 mg prn, anastrozole 1 mg daily, multi vit, vit d3 daily, calcium bid  Allergies: nkda  Medication Adjustment:   Procedure date & time: 07/01/13 at 1030

## 2013-06-09 NOTE — Telephone Encounter (Signed)
agree

## 2013-06-12 ENCOUNTER — Encounter (HOSPITAL_COMMUNITY): Payer: Self-pay | Admitting: Emergency Medicine

## 2013-06-12 ENCOUNTER — Emergency Department (HOSPITAL_COMMUNITY)
Admission: EM | Admit: 2013-06-12 | Discharge: 2013-06-12 | Disposition: A | Discharge status: Home / Self Care | Payer: Medicare Other | Attending: Emergency Medicine | Admitting: Emergency Medicine

## 2013-06-12 DIAGNOSIS — I10 Essential (primary) hypertension: Secondary | ICD-10-CM | POA: Insufficient documentation

## 2013-06-12 DIAGNOSIS — S8990XA Unspecified injury of unspecified lower leg, initial encounter: Secondary | ICD-10-CM | POA: Diagnosis not present

## 2013-06-12 DIAGNOSIS — Z923 Personal history of irradiation: Secondary | ICD-10-CM | POA: Diagnosis not present

## 2013-06-12 DIAGNOSIS — Y9339 Activity, other involving climbing, rappelling and jumping off: Secondary | ICD-10-CM | POA: Insufficient documentation

## 2013-06-12 DIAGNOSIS — Z87891 Personal history of nicotine dependence: Secondary | ICD-10-CM | POA: Diagnosis not present

## 2013-06-12 DIAGNOSIS — E079 Disorder of thyroid, unspecified: Secondary | ICD-10-CM | POA: Diagnosis not present

## 2013-06-12 DIAGNOSIS — M79605 Pain in left leg: Secondary | ICD-10-CM

## 2013-06-12 DIAGNOSIS — M7989 Other specified soft tissue disorders: Secondary | ICD-10-CM | POA: Diagnosis not present

## 2013-06-12 DIAGNOSIS — K219 Gastro-esophageal reflux disease without esophagitis: Secondary | ICD-10-CM | POA: Insufficient documentation

## 2013-06-12 DIAGNOSIS — M79609 Pain in unspecified limb: Secondary | ICD-10-CM | POA: Diagnosis not present

## 2013-06-12 DIAGNOSIS — Y929 Unspecified place or not applicable: Secondary | ICD-10-CM | POA: Insufficient documentation

## 2013-06-12 DIAGNOSIS — Z79899 Other long term (current) drug therapy: Secondary | ICD-10-CM | POA: Diagnosis not present

## 2013-06-12 DIAGNOSIS — Z853 Personal history of malignant neoplasm of breast: Secondary | ICD-10-CM | POA: Diagnosis not present

## 2013-06-12 DIAGNOSIS — X500XXA Overexertion from strenuous movement or load, initial encounter: Secondary | ICD-10-CM | POA: Insufficient documentation

## 2013-06-12 MED ORDER — ENOXAPARIN SODIUM 80 MG/0.8ML ~~LOC~~ SOLN
1.0000 mg/kg | Freq: Once | SUBCUTANEOUS | Status: AC
  Administered 2013-06-12: 70 mg via SUBCUTANEOUS
  Filled 2013-06-12: qty 0.8

## 2013-06-12 NOTE — ED Provider Notes (Signed)
CSN: [id number]     Arrival date & time 06/12/13  1022 History  This chart was scribed for Donnetta Hutching, MD by Smiley Houseman, ED Scribe. The patient was seen in room APA12/APA12. Patient's care was started at 11:08 AM.    Chief Complaint  Patient presents with  . Leg Pain  . Leg Swelling   The history is provided by the patient. No language interpreter was used.   HPI Comments: Lisa Torres is a 67 y.o. female who presents to the Emergency Department complaining of constant worsening left leg pain and associated swelling.  She states this pain has been off and on since this summer, but has worsened in the last few days.  On the 12/25 she reports she felt a "pop" in the left leg and she was unable to stand back up.  Currently pt reports she is able to ambulate.  She denies any prior test for this pain.  Severity is mild to moderate. Palpation makes pain worse   PCP-Dr. Neita Carp       Past Medical History  Diagnosis Date  . Breast cancer 11/2011  . Radiation 04/2012    33 treatments for breast cancer  . Hypertension   . Thyroid disease   . GERD (gastroesophageal reflux disease)    Past Surgical History  Procedure Laterality Date  . Breast lumpectomy Left 12/2011  . Abdominal hysterectomy      partial  . Cholecystectomy    . Tubal ligation     Family History  Problem Relation Age of Onset  . Cancer Mother     colon cancer  . Cancer Father     throat cancer   History  Substance Use Topics  . Smoking status: Former Smoker -- 0.50 packs/day for 18 years    Types: Cigarettes    Quit date: 09/30/1998  . Smokeless tobacco: Never Used  . Alcohol Use: No   OB History   Grav Para Term Preterm Abortions TAB SAB Ect Mult Living   3 3 3       3      Review of Systems A complete 10 system review of systems was obtained and all systems are negative except as noted in the HPI and PMH.    Allergies  Review of patient's allergies indicates no known allergies.  Home  Medications   Current Outpatient Rx  Name  Route  Sig  Dispense  Refill  . amLODipine (NORVASC) 5 MG tablet   Oral   Take 5 mg by mouth daily.         Marland Kitchen anastrozole (ARIMIDEX) 1 MG tablet   Oral   Take 1 mg by mouth daily.         Marland Kitchen anastrozole (ARIMIDEX) 1 MG tablet   Oral   Take 1 tablet (1 mg total) by mouth daily.   90 tablet   3   . calcium carbonate (OS-CAL) 600 MG TABS tablet   Oral   Take 600 mg by mouth 2 (two) times daily with a meal.         . Cholecalciferol (VITAMIN D-3) 1000 UNITS CAPS   Oral   Take 1,000 Units by mouth 2 (two) times daily.         Marland Kitchen levothyroxine (SYNTHROID, LEVOTHROID) 88 MCG tablet   Oral   Take 88 mcg by mouth daily before breakfast.         . LORazepam (ATIVAN) 1 MG tablet   Oral   Take 1  mg by mouth at bedtime.         . naproxen sodium (ANAPROX) 220 MG tablet   Oral   Take 220 mg by mouth as needed.         . polyethylene glycol (MIRALAX / GLYCOLAX) packet   Oral   Take 17 g by mouth daily.         . ranitidine (ZANTAC) 150 MG tablet   Oral   Take 150 mg by mouth 2 (two) times daily.          Triage Vitals: BP 151/81  Pulse 73  Temp(Src) 97.8 F (36.6 C) (Oral)  Resp 16  Ht 5' 1.5" (1.562 m)  Wt 149 lb (67.586 kg)  BMI 27.70 kg/m2  SpO2 98% Physical Exam  Nursing note and vitals reviewed. Constitutional: She is oriented to person, place, and time. She appears well-developed and well-nourished.  HENT:  Head: Normocephalic and atraumatic.  Eyes: Conjunctivae and EOM are normal. Pupils are equal, round, and reactive to light.  Neck: Normal range of motion. Neck supple.  Cardiovascular: Normal rate, regular rhythm and normal heart sounds.   Pulmonary/Chest: Effort normal and breath sounds normal.  Abdominal: Soft. Bowel sounds are normal.  Musculoskeletal: Normal range of motion. She exhibits edema (slight edema left lower leg) and tenderness.  Left popliteal and proximal left calf muscle    Neurological: She is alert and oriented to person, place, and time.  Skin: Skin is warm and dry.  Psychiatric: She has a normal mood and affect. Her behavior is normal.    ED Course  Procedures (including critical care time)\ DIAGNOSTIC STUDIES: Oxygen Saturation is 98% on RA, normal by my interpretation.    COORDINATION OF CARE: 11:12 AM Will order subq lobeniox and venous doppler. Patient informed of current plan of treatment and evaluation and agrees with plan.     Labs Review Labs Reviewed - No data to display Imaging Review No results found.  EKG Interpretation   None       MDM  No diagnosis found. Symptoms have been intermittent for several months. Patient is at risk for clots secondary to history of breast cancer. Will schedule ultrasound of left lower extremity to rule out DVT. Subcutaneous Lovenox 1 mg per kilogram given today.  I personally performed the services described in this documentation, which was scribed in my presence. The recorded information has been reviewed and is accurate.    Donnetta Hutching, MD 06/12/13 1209

## 2013-06-12 NOTE — ED Notes (Signed)
Patient c/o left calf pain. Per patient intermittent pain x3-4 months with pain progressively worse since Christmas. Per patient felt a pop in calf while climbing stairs and now pain has become constant. Reports swelling in calf.

## 2013-06-13 ENCOUNTER — Ambulatory Visit (HOSPITAL_COMMUNITY)
Admit: 2013-06-13 | Discharge: 2013-06-13 | Disposition: A | Discharge status: Home / Self Care | Payer: Medicare Other | Source: Ambulatory Visit | Attending: Emergency Medicine | Admitting: Emergency Medicine

## 2013-06-13 DIAGNOSIS — M7989 Other specified soft tissue disorders: Secondary | ICD-10-CM | POA: Insufficient documentation

## 2013-06-13 MED ORDER — TRAMADOL HCL 50 MG PO TABS
50.0000 mg | ORAL_TABLET | Freq: Four times a day (QID) | ORAL | Status: DC | PRN
Start: 2013-06-13 — End: 2013-06-21

## 2013-06-13 NOTE — ED Provider Notes (Signed)
Medical screening examination/treatment/procedure(s) were performed by non-physician practitioner and as supervising physician I was immediately available for consultation/collaboration.  EKG Interpretation   None        Donnetta Hutching, MD 06/13/13 1452

## 2013-06-13 NOTE — ED Provider Notes (Signed)
Lisa Torres is a 67 y.o. female presenting for dvt study [place] today which is negative.  This result was discussed with her.  She continues to have pain which is briefly relieved with aleve, ice,  Elevation.  Encouraged f/u with pcp this week, pt to call for appt.  She will be prescribed small quantity of tramadol for pain relief.     Burgess Amor, PA-C 06/13/13 1118

## 2013-06-15 DIAGNOSIS — S139XXA Sprain of joints and ligaments of unspecified parts of neck, initial encounter: Secondary | ICD-10-CM | POA: Diagnosis not present

## 2013-06-15 DIAGNOSIS — IMO0002 Reserved for concepts with insufficient information to code with codable children: Secondary | ICD-10-CM | POA: Diagnosis not present

## 2013-06-18 ENCOUNTER — Encounter (HOSPITAL_COMMUNITY): Payer: Self-pay | Admitting: Pharmacy Technician

## 2013-06-21 ENCOUNTER — Emergency Department (HOSPITAL_COMMUNITY)
Admission: EM | Admit: 2013-06-21 | Discharge: 2013-06-21 | Disposition: A | Discharge status: Home / Self Care | Payer: Medicare Other | Attending: Emergency Medicine | Admitting: Emergency Medicine

## 2013-06-21 ENCOUNTER — Other Ambulatory Visit: Payer: Self-pay

## 2013-06-21 ENCOUNTER — Encounter (HOSPITAL_COMMUNITY): Payer: Self-pay | Admitting: Emergency Medicine

## 2013-06-21 ENCOUNTER — Emergency Department (HOSPITAL_COMMUNITY): Payer: Medicare Other

## 2013-06-21 DIAGNOSIS — R1011 Right upper quadrant pain: Secondary | ICD-10-CM | POA: Diagnosis not present

## 2013-06-21 DIAGNOSIS — K219 Gastro-esophageal reflux disease without esophagitis: Secondary | ICD-10-CM | POA: Insufficient documentation

## 2013-06-21 DIAGNOSIS — R079 Chest pain, unspecified: Secondary | ICD-10-CM | POA: Diagnosis not present

## 2013-06-21 DIAGNOSIS — K5900 Constipation, unspecified: Secondary | ICD-10-CM | POA: Diagnosis not present

## 2013-06-21 DIAGNOSIS — M549 Dorsalgia, unspecified: Secondary | ICD-10-CM | POA: Diagnosis not present

## 2013-06-21 DIAGNOSIS — M542 Cervicalgia: Secondary | ICD-10-CM | POA: Diagnosis not present

## 2013-06-21 DIAGNOSIS — Z90710 Acquired absence of both cervix and uterus: Secondary | ICD-10-CM | POA: Diagnosis not present

## 2013-06-21 DIAGNOSIS — Z853 Personal history of malignant neoplasm of breast: Secondary | ICD-10-CM | POA: Insufficient documentation

## 2013-06-21 DIAGNOSIS — Z923 Personal history of irradiation: Secondary | ICD-10-CM | POA: Insufficient documentation

## 2013-06-21 DIAGNOSIS — Z9851 Tubal ligation status: Secondary | ICD-10-CM | POA: Diagnosis not present

## 2013-06-21 DIAGNOSIS — Z79899 Other long term (current) drug therapy: Secondary | ICD-10-CM | POA: Insufficient documentation

## 2013-06-21 DIAGNOSIS — R1012 Left upper quadrant pain: Secondary | ICD-10-CM | POA: Diagnosis not present

## 2013-06-21 DIAGNOSIS — Z87891 Personal history of nicotine dependence: Secondary | ICD-10-CM | POA: Insufficient documentation

## 2013-06-21 DIAGNOSIS — M7989 Other specified soft tissue disorders: Secondary | ICD-10-CM | POA: Diagnosis not present

## 2013-06-21 DIAGNOSIS — R1013 Epigastric pain: Secondary | ICD-10-CM | POA: Insufficient documentation

## 2013-06-21 DIAGNOSIS — Z9089 Acquired absence of other organs: Secondary | ICD-10-CM | POA: Diagnosis not present

## 2013-06-21 DIAGNOSIS — R110 Nausea: Secondary | ICD-10-CM | POA: Diagnosis not present

## 2013-06-21 DIAGNOSIS — R443 Hallucinations, unspecified: Secondary | ICD-10-CM | POA: Insufficient documentation

## 2013-06-21 DIAGNOSIS — E079 Disorder of thyroid, unspecified: Secondary | ICD-10-CM | POA: Diagnosis not present

## 2013-06-21 DIAGNOSIS — I10 Essential (primary) hypertension: Secondary | ICD-10-CM | POA: Diagnosis not present

## 2013-06-21 LAB — CBC WITH DIFFERENTIAL/PLATELET
BASOS PCT: 0 % (ref 0–1)
Basophils Absolute: 0 10*3/uL (ref 0.0–0.1)
Eosinophils Absolute: 0.1 10*3/uL (ref 0.0–0.7)
Eosinophils Relative: 4 % (ref 0–5)
HEMATOCRIT: 36.9 % (ref 36.0–46.0)
HEMOGLOBIN: 12.2 g/dL (ref 12.0–15.0)
LYMPHS ABS: 1.1 10*3/uL (ref 0.7–4.0)
LYMPHS PCT: 38 % (ref 12–46)
MCH: 29 pg (ref 26.0–34.0)
MCHC: 33.1 g/dL (ref 30.0–36.0)
MCV: 87.6 fL (ref 78.0–100.0)
MONO ABS: 0.3 10*3/uL (ref 0.1–1.0)
MONOS PCT: 10 % (ref 3–12)
Neutro Abs: 1.4 10*3/uL — ABNORMAL LOW (ref 1.7–7.7)
Neutrophils Relative %: 47 % (ref 43–77)
Platelets: 265 10*3/uL (ref 150–400)
RBC: 4.21 MIL/uL (ref 3.87–5.11)
RDW: 12.4 % (ref 11.5–15.5)
WBC: 2.9 10*3/uL — AB (ref 4.0–10.5)

## 2013-06-21 LAB — COMPREHENSIVE METABOLIC PANEL
ALBUMIN: 4.1 g/dL (ref 3.5–5.2)
ALK PHOS: 102 U/L (ref 39–117)
ALT: 53 U/L — AB (ref 0–35)
AST: 25 U/L (ref 0–37)
BUN: 10 mg/dL (ref 6–23)
CHLORIDE: 101 meq/L (ref 96–112)
CO2: 27 mEq/L (ref 19–32)
Calcium: 10 mg/dL (ref 8.4–10.5)
Creatinine, Ser: 0.95 mg/dL (ref 0.50–1.10)
GFR calc Af Amer: 70 mL/min — ABNORMAL LOW (ref >90)
GFR calc non Af Amer: 61 mL/min — ABNORMAL LOW (ref >90)
Glucose, Bld: 87 mg/dL (ref 70–99)
POTASSIUM: 4.5 meq/L (ref 3.7–5.3)
SODIUM: 140 meq/L (ref 137–147)
TOTAL PROTEIN: 7.9 g/dL (ref 6.0–8.3)
Total Bilirubin: 0.5 mg/dL (ref 0.3–1.2)

## 2013-06-21 LAB — LIPASE, BLOOD: Lipase: 23 U/L (ref 11–59)

## 2013-06-21 MED ORDER — SODIUM CHLORIDE 0.9 % IV SOLN: INTRAVENOUS | Status: DC

## 2013-06-21 MED ORDER — HYDROMORPHONE HCL PF 1 MG/ML IJ SOLN
1.0000 mg | Freq: Once | INTRAMUSCULAR | Status: AC
  Administered 2013-06-21: 1 mg via INTRAVENOUS
  Filled 2013-06-21: qty 1

## 2013-06-21 MED ORDER — SODIUM CHLORIDE 0.9 % IV BOLUS (SEPSIS)
500.0000 mL | Freq: Once | INTRAVENOUS | Status: AC
  Administered 2013-06-21: 500 mL via INTRAVENOUS

## 2013-06-21 MED ORDER — ONDANSETRON HCL 4 MG/2ML IJ SOLN
4.0000 mg | Freq: Once | INTRAMUSCULAR | Status: AC
  Administered 2013-06-21: 4 mg via INTRAVENOUS
  Filled 2013-06-21: qty 2

## 2013-06-21 MED ORDER — PANTOPRAZOLE SODIUM 40 MG IV SOLR
40.0000 mg | Freq: Once | INTRAVENOUS | Status: AC
  Administered 2013-06-21: 40 mg via INTRAVENOUS
  Filled 2013-06-21: qty 40

## 2013-06-21 MED ORDER — IOHEXOL 300 MG/ML  SOLN
50.0000 mL | Freq: Once | INTRAMUSCULAR | Status: AC | PRN
  Administered 2013-06-21: 50 mL via ORAL

## 2013-06-21 MED ORDER — ESOMEPRAZOLE MAGNESIUM 40 MG PO CPDR: 40.0000 mg | DELAYED_RELEASE_CAPSULE | Freq: Every day | ORAL | Status: DC

## 2013-06-21 MED ORDER — HYDROCODONE-ACETAMINOPHEN 5-325 MG PO TABS: 1.0000 | ORAL_TABLET | Freq: Four times a day (QID) | ORAL | Status: DC | PRN

## 2013-06-21 MED ORDER — IOHEXOL 300 MG/ML  SOLN
100.0000 mL | Freq: Once | INTRAMUSCULAR | Status: AC | PRN
  Administered 2013-06-21: 100 mL via INTRAVENOUS

## 2013-06-21 NOTE — ED Provider Notes (Signed)
CSN: [id number]     Arrival date & time 06/21/13  1027 History   First MD Initiated Contact with Patient 06/21/13 1623  This chart was scribed for Mervin Kung, MD by Anastasia Pall, ED Scribe. This patient was seen in room APA12/APA12 and the patient's care was started at 4:38 PM.     Chief Complaint  Patient presents with  . Abdominal Pain    Patient is a 68 y.o. female presenting with abdominal pain. The history is provided by the patient. No language interpreter was used.  Abdominal Pain Pain location:  Epigastric, LUQ and RUQ Pain quality: burning (and stinging)   Pain radiates to:  Back Duration:  2 days Timing:  Intermittent Chronicity:  New Associated symptoms: chest pain (lower) and nausea   Associated symptoms: no chills, no cough, no diarrhea, no dysuria, no fever, no hematuria, no sore throat and no vomiting   Associated symptoms comment:  Pt has had hallucinations  HPI Comments: Lisa Torres is a 68 y.o. female  Pt was seen here 12/22 for ED visit. She was started on Tramadol.  She presents to the Emergency Department today complaining of intermittent, burning, stinging, upper abdominal pain, with a severity of 7/10, that radiates to her back, onset 2 days ago, 01/03. She reports associated nausea and chills. She denies h/o similar abdominal pain. She reports she is still taking Zantac. She reports taking Aleve once last week, and states she only takes it occassionally. She also reports some left LE swelling and neck pain since her last visit. She denies diarrhea, vomiting, fever, and any other associated symptoms.   PCP - [name], MD - Ledell Noss  Past Medical History  Diagnosis Date  . Breast cancer 11/2011  . Radiation 04/2012    33 treatments for breast cancer  . Hypertension   . Thyroid disease   . GERD (gastroesophageal reflux disease)    Past Surgical History  Procedure Laterality Date  . Breast lumpectomy Left 12/2011  . Abdominal hysterectomy       partial  . Cholecystectomy    . Tubal ligation     Family History  Problem Relation Age of Onset  . Cancer Mother     colon cancer  . Cancer Father     throat cancer   History  Substance Use Topics  . Smoking status: Former Smoker -- 0.50 packs/day for 18 years    Types: Cigarettes    Quit date: 09/30/1998  . Smokeless tobacco: Never Used  . Alcohol Use: No   OB History   Grav Para Term Preterm Abortions TAB SAB Ect Mult Living   3 3 3       3      Review of Systems  Constitutional: Negative for fever and chills.  HENT: Negative for rhinorrhea and sore throat.   Eyes: Negative for visual disturbance.  Respiratory: Negative for cough.   Cardiovascular: Positive for chest pain (lower) and leg swelling.  Gastrointestinal: Positive for nausea and abdominal pain (upper). Negative for vomiting and diarrhea.  Genitourinary: Negative for dysuria and hematuria.  Musculoskeletal: Positive for back pain and neck pain. Negative for myalgias.  Skin: Negative for rash.  Neurological: Negative for headaches.  Hematological: Does not bruise/bleed easily.  Psychiatric/Behavioral: Positive for hallucinations. Negative for confusion.    Allergies  Tramadol  Home Medications   Current Outpatient Rx  Name  Route  Sig  Dispense  Refill  . amLODipine (NORVASC) 5 MG tablet   Oral  Take 5 mg by mouth every morning.          Marland Kitchen anastrozole (ARIMIDEX) 1 MG tablet   Oral   Take 1 mg by mouth every morning.          . calcium carbonate (OS-CAL) 600 MG TABS tablet   Oral   Take 600 mg by mouth 2 (two) times daily with a meal.         . Cholecalciferol (VITAMIN D-3) 1000 UNITS CAPS   Oral   Take 1,000 Units by mouth 2 (two) times daily.         . cyclobenzaprine (FLEXERIL) 10 MG tablet   Oral   Take 10 mg by mouth daily as needed. For muscle spasms         . levothyroxine (SYNTHROID, LEVOTHROID) 88 MCG tablet   Oral   Take 88 mcg by mouth daily before breakfast.          . naproxen sodium (ALEVE) 220 MG tablet   Oral   Take 440 mg by mouth 2 (two) times daily as needed (Pain).         . polyethylene glycol (MIRALAX / GLYCOLAX) packet   Oral   Take 17 g by mouth daily as needed for moderate constipation.          . ranitidine (ZANTAC) 150 MG tablet   Oral   Take 150 mg by mouth 2 (two) times daily.         Marland Kitchen esomeprazole (NEXIUM) 40 MG capsule   Oral   Take 1 capsule (40 mg total) by mouth daily.   30 capsule   2   . HYDROcodone-acetaminophen (NORCO/VICODIN) 5-325 MG per tablet   Oral   Take 1-2 tablets by mouth every 6 (six) hours as needed for moderate pain.   20 tablet   0   . LORazepam (ATIVAN) 1 MG tablet   Oral   Take 1 mg by mouth at bedtime as needed for sleep.           BP 163/93  Pulse 88  Temp(Src) 98 F (36.7 C) (Oral)  Resp 15  SpO2 100%  Physical Exam  Nursing note and vitals reviewed. Constitutional: She is oriented to person, place, and time. She appears well-developed and well-nourished. No distress.  HENT:  Head: Normocephalic and atraumatic.  Eyes: EOM are normal.  Neck: Neck supple. No tracheal deviation present.  Cardiovascular: Normal rate, regular rhythm and normal heart sounds.  Exam reveals no gallop and no friction rub.   No murmur heard. Pulmonary/Chest: Effort normal and breath sounds normal. No respiratory distress. She has no wheezes. She has no rales.  Abdominal: Soft. Bowel sounds are increased. There is tenderness. There is no rebound and no guarding.  Tender RUQ, LUQ, epigastric. No umbilical, suprapubic tenderness.  Musculoskeletal: Normal range of motion.  Neurological: She is alert and oriented to person, place, and time. No cranial nerve deficit. She exhibits normal muscle tone. Coordination normal.  Skin: Skin is warm and dry.  Psychiatric: She has a normal mood and affect. Her behavior is normal.    ED Course  Procedures (including critical care time)  DIAGNOSTIC  STUDIES: Oxygen Saturation is 100% on room air, normal by my interpretation.    COORDINATION OF CARE: 4:52 PM-Discussed treatment plan which includes IV fluids and pain medication with pt at bedside and pt agreed to plan.    Medications  0.9 %  sodium chloride infusion (not administered)  sodium chloride  0.9 % bolus 500 mL (500 mLs Intravenous New Bag/Given 06/21/13 1734)  ondansetron (ZOFRAN) injection 4 mg (4 mg Intravenous Given 06/21/13 1734)  HYDROmorphone (DILAUDID) injection 1 mg (1 mg Intravenous Given 06/21/13 1734)  pantoprazole (PROTONIX) injection 40 mg (40 mg Intravenous Given 06/21/13 1734)  iohexol (OMNIPAQUE) 300 MG/ML solution 50 mL (50 mLs Oral Contrast Given 06/21/13 1720)  iohexol (OMNIPAQUE) 300 MG/ML solution 100 mL (100 mLs Intravenous Contrast Given 06/21/13 2001)   Results for orders placed during the hospital encounter of 06/21/13  LIPASE, BLOOD      Result Value Range   Lipase 23  11 - 59 U/L  CBC WITH DIFFERENTIAL      Result Value Range   WBC 2.9 (*) 4.0 - 10.5 K/uL   RBC 4.21  3.87 - 5.11 MIL/uL   Hemoglobin 12.2  12.0 - 15.0 g/dL   HCT 36.9  36.0 - 46.0 %   MCV 87.6  78.0 - 100.0 fL   MCH 29.0  26.0 - 34.0 pg   MCHC 33.1  30.0 - 36.0 g/dL   RDW 12.4  11.5 - 15.5 %   Platelets 265  150 - 400 K/uL   Neutrophils Relative % 47  43 - 77 %   Neutro Abs 1.4 (*) 1.7 - 7.7 K/uL   Lymphocytes Relative 38  12 - 46 %   Lymphs Abs 1.1  0.7 - 4.0 K/uL   Monocytes Relative 10  3 - 12 %   Monocytes Absolute 0.3  0.1 - 1.0 K/uL   Eosinophils Relative 4  0 - 5 %   Eosinophils Absolute 0.1  0.0 - 0.7 K/uL   Basophils Relative 0  0 - 1 %   Basophils Absolute 0.0  0.0 - 0.1 K/uL  COMPREHENSIVE METABOLIC PANEL      Result Value Range   Sodium 140  137 - 147 mEq/L   Potassium 4.5  3.7 - 5.3 mEq/L   Chloride 101  96 - 112 mEq/L   CO2 27  19 - 32 mEq/L   Glucose, Bld 87  70 - 99 mg/dL   BUN 10  6 - 23 mg/dL   Creatinine, Ser 0.95  0.50 - 1.10 mg/dL   Calcium 10.0  8.4 -  10.5 mg/dL   Total Protein 7.9  6.0 - 8.3 g/dL   Albumin 4.1  3.5 - 5.2 g/dL   AST 25  0 - 37 U/L   ALT 53 (*) 0 - 35 U/L   Alkaline Phosphatase 102  39 - 117 U/L   Total Bilirubin 0.5  0.3 - 1.2 mg/dL   GFR calc non Af Amer 61 (*) >90 mL/min   GFR calc Af Amer 70 (*) >90 mL/min   Results for orders placed during the hospital encounter of 06/21/13  LIPASE, BLOOD      Result Value Range   Lipase 23  11 - 59 U/L  CBC WITH DIFFERENTIAL      Result Value Range   WBC 2.9 (*) 4.0 - 10.5 K/uL   RBC 4.21  3.87 - 5.11 MIL/uL   Hemoglobin 12.2  12.0 - 15.0 g/dL   HCT 36.9  36.0 - 46.0 %   MCV 87.6  78.0 - 100.0 fL   MCH 29.0  26.0 - 34.0 pg   MCHC 33.1  30.0 - 36.0 g/dL   RDW 12.4  11.5 - 15.5 %   Platelets 265  150 - 400 K/uL   Neutrophils Relative % 47  43 - 77 %   Neutro Abs 1.4 (*) 1.7 - 7.7 K/uL   Lymphocytes Relative 38  12 - 46 %   Lymphs Abs 1.1  0.7 - 4.0 K/uL   Monocytes Relative 10  3 - 12 %   Monocytes Absolute 0.3  0.1 - 1.0 K/uL   Eosinophils Relative 4  0 - 5 %   Eosinophils Absolute 0.1  0.0 - 0.7 K/uL   Basophils Relative 0  0 - 1 %   Basophils Absolute 0.0  0.0 - 0.1 K/uL  COMPREHENSIVE METABOLIC PANEL      Result Value Range   Sodium 140  137 - 147 mEq/L   Potassium 4.5  3.7 - 5.3 mEq/L   Chloride 101  96 - 112 mEq/L   CO2 27  19 - 32 mEq/L   Glucose, Bld 87  70 - 99 mg/dL   BUN 10  6 - 23 mg/dL   Creatinine, Ser 0.95  0.50 - 1.10 mg/dL   Calcium 10.0  8.4 - 10.5 mg/dL   Total Protein 7.9  6.0 - 8.3 g/dL   Albumin 4.1  3.5 - 5.2 g/dL   AST 25  0 - 37 U/L   ALT 53 (*) 0 - 35 U/L   Alkaline Phosphatase 102  39 - 117 U/L   Total Bilirubin 0.5  0.3 - 1.2 mg/dL   GFR calc non Af Amer 61 (*) >90 mL/min   GFR calc Af Amer 70 (*) >90 mL/min   Ct Abdomen Pelvis W Contrast  06/21/2013   CLINICAL DATA:  Epigastric pain.  History of left breast cancer.  EXAM: CT ABDOMEN AND PELVIS WITH CONTRAST  TECHNIQUE: Multidetector CT imaging of the abdomen and pelvis was  performed using the standard protocol following bolus administration of intravenous contrast.  CONTRAST:  6mL OMNIPAQUE IOHEXOL 300 MG/ML SOLN, 147mL OMNIPAQUE IOHEXOL 300 MG/ML SOLN  COMPARISON:  03/24/2013  FINDINGS: Lung bases are clear except for a few small chronic parenchymal lung densities. No evidence for free air.  Again noted is a dilatation of the intrahepatic and extrahepatic biliary system likely secondary to the cholecystectomy. Otherwise, normal appearance of the liver and portal venous system. Again noted is a large fat attenuating lesion or area within the pancreatic body. This fat attenuating area is difficult to measure but measures up to 2.1 cm in the craniocaudal dimension and similar to the prior examination. No significant pancreatic duct dilatation. Normal appearance of the spleen, adrenal glands and both kidneys. Stomach is moderately distended. No significant small bowel dilatation. Uterus has been removed. There is fluid in the urinary bladder. There is no significant abdominal free fluid or lymphadenopathy. There is stool throughout the colon. Normal appearance of the appendix.  Degenerative facet disease in the lower lumbar spine.  IMPRESSION: No acute abnormalities within the abdomen or pelvis.  Again noted is a fat attenuating structure in the pancreatic body region.  Large amount of stool throughout the colon.   Electronically Signed   By: Markus Daft M.D.   On: 06/21/2013 20:34   US Venous Img Lower Unilateral Left  06/13/2013   CLINICAL DATA:  Left leg swelling.  EXAM: Left LOWER EXTREMITY VENOUS DOPPLER ULTRASOUND  TECHNIQUE: Gray-scale sonography with graded compression, as well as color Doppler and duplex ultrasound, were performed to evaluate the deep venous system from the level of the common femoral vein through the popliteal and proximal calf veins. Spectral Doppler was utilized to evaluate flow at rest  and with distal augmentation maneuvers.  COMPARISON:  None.   FINDINGS: Thrombus within deep veins:  None visualized.  Compressibility of deep veins:  Normal.  Duplex waveform respiratory phasicity:  Normal.  Duplex waveform response to augmentation:  Normal.  Venous reflux:  None visualized.  Other findings:  None visualized.  IMPRESSION: There is no evidence of superficial or deep venous thrombosis in the left lower extremity. No popliteal cyst is demonstrated.   Electronically Signed   By: David  Martinique   On: 06/13/2013 09:31       MDM   1. Epigastric abdominal pain   2. Constipation    Workup without evidence of pancreatitis or any gallbladder problems. Still could be a peptic ulcer disease issue. Will stop Zantac and start Nexium. Treat with pain medication. Cautions about the constipation and the pain medications provided patient is to increase fluids juices. Also needs to make an appointment with her record Dr. in the next few days. Patient to be discharged home.   I personally performed the services described in this documentation, which was scribed in my presence. The recorded information has been reviewed and is accurate.     Mervin Kung, MD 06/21/13 [phone]

## 2013-06-21 NOTE — ED Notes (Signed)
Pt reports nausea Friday and Saturday, none today.  Denies vomiting or diarrhea.  Denies chest pain.

## 2013-06-21 NOTE — ED Notes (Signed)
Pt reports was given tramadol here on Dec 28th.  Reports started having hallucinations and burning in upper abd while taking the medication.  Pt says stopped taking the medication and hallucinations have stopped but still having the burning sensation in upper abd.

## 2013-06-21 NOTE — Discharge Instructions (Signed)
CT scan without any significant findings. As we discussed still could represent a stomach ulcer. Stop the Zantac start the Nexium. Take pain medicine as needed. In addition CT scan did show constipation so drinking plenty of fluids and juices would be helpful. Pain medications can make you more constipated. Make an appointment to followup with your regular Dr. in the next few days.

## 2013-06-25 DIAGNOSIS — M79609 Pain in unspecified limb: Secondary | ICD-10-CM | POA: Diagnosis not present

## 2013-06-25 DIAGNOSIS — K2960 Other gastritis without bleeding: Secondary | ICD-10-CM | POA: Diagnosis not present

## 2013-06-25 DIAGNOSIS — M542 Cervicalgia: Secondary | ICD-10-CM | POA: Diagnosis not present

## 2013-06-25 DIAGNOSIS — K5909 Other constipation: Secondary | ICD-10-CM | POA: Diagnosis not present

## 2013-07-01 ENCOUNTER — Encounter (HOSPITAL_COMMUNITY): Payer: Self-pay | Admitting: *Deleted

## 2013-07-01 ENCOUNTER — Ambulatory Visit (HOSPITAL_COMMUNITY)
Admission: RE | Admit: 2013-07-01 | Discharge: 2013-07-01 | Disposition: A | Discharge status: Home / Self Care | Payer: Medicare Other | Source: Ambulatory Visit | Attending: Internal Medicine | Admitting: Internal Medicine

## 2013-07-01 ENCOUNTER — Encounter (HOSPITAL_COMMUNITY)
Admission: RE | Disposition: A | Discharge status: Home / Self Care | Payer: Self-pay | Source: Ambulatory Visit | Attending: Internal Medicine

## 2013-07-01 DIAGNOSIS — Z860100 Personal history of colon polyps, unspecified: Secondary | ICD-10-CM | POA: Insufficient documentation

## 2013-07-01 DIAGNOSIS — I10 Essential (primary) hypertension: Secondary | ICD-10-CM | POA: Insufficient documentation

## 2013-07-01 DIAGNOSIS — D126 Benign neoplasm of colon, unspecified: Secondary | ICD-10-CM | POA: Insufficient documentation

## 2013-07-01 DIAGNOSIS — Z1211 Encounter for screening for malignant neoplasm of colon: Secondary | ICD-10-CM | POA: Diagnosis not present

## 2013-07-01 DIAGNOSIS — Z800 Family history of malignant neoplasm of digestive organs: Secondary | ICD-10-CM

## 2013-07-01 DIAGNOSIS — K644 Residual hemorrhoidal skin tags: Secondary | ICD-10-CM

## 2013-07-01 DIAGNOSIS — C50919 Malignant neoplasm of unspecified site of unspecified female breast: Secondary | ICD-10-CM | POA: Insufficient documentation

## 2013-07-01 HISTORY — PX: COLONOSCOPY: SHX5424

## 2013-07-01 SURGERY — COLONOSCOPY
Anesthesia: Moderate Sedation

## 2013-07-01 MED ORDER — SIMETHICONE 40 MG/0.6ML PO SUSP
ORAL | Status: DC | PRN
  Administered 2013-07-01: 10:00:00

## 2013-07-01 MED ORDER — MEPERIDINE HCL 50 MG/ML IJ SOLN
INTRAMUSCULAR | Status: DC | PRN
  Administered 2013-07-01 (×2): 25 mg via INTRAVENOUS

## 2013-07-01 MED ORDER — MIDAZOLAM HCL 5 MG/5ML IJ SOLN
INTRAMUSCULAR | Status: AC
  Filled 2013-07-01: qty 10

## 2013-07-01 MED ORDER — MEPERIDINE HCL 50 MG/ML IJ SOLN
INTRAMUSCULAR | Status: AC
  Filled 2013-07-01: qty 1

## 2013-07-01 MED ORDER — MIDAZOLAM HCL 5 MG/5ML IJ SOLN
INTRAMUSCULAR | Status: DC | PRN
Start: 2013-07-01 — End: 2013-07-01
  Administered 2013-07-01 (×2): 1 mg via INTRAVENOUS
  Administered 2013-07-01 (×2): 2 mg via INTRAVENOUS

## 2013-07-01 MED ORDER — SODIUM CHLORIDE 0.9 % IV SOLN
INTRAVENOUS | Status: DC
  Administered 2013-07-01: 1000 mL via INTRAVENOUS

## 2013-07-01 NOTE — Op Note (Signed)
COLONOSCOPY PROCEDURE REPORT  PATIENT:  Lisa Torres  MR#:  [id number] Birthdate:  11-Oct-1945, 68 y.o., female Endoscopist:  Dr. Rogene Houston, MD Referred By:  Dr. Rush Barer, MD  Procedure Date: 07/01/2013  Procedure:   Colonoscopy  Indications:  Patient is 68 year old [place] female who is history of colonic adenoma and family history of colon carcinoma in her mother at age 64. Her last colonoscopy was in November 2009 with removal of single tubular adenoma from ascending colon.  Informed Consent:  The procedure and risks were reviewed with the patient and informed consent was obtained.  Medications:  Demerol 50 mg IV Versed 6 mg IV  Description of procedure:  After a digital rectal exam was performed, that colonoscope was advanced from the anus through the rectum and colon to the area of the cecum, ileocecal valve and appendiceal orifice. The cecum was deeply intubated. These structures were well-seen and photographed for the record. From the level of the cecum and ileocecal valve, the scope was slowly and cautiously withdrawn. The mucosal surfaces were carefully surveyed utilizing scope tip to flexion to facilitate fold flattening as needed. The scope was pulled down into the rectum where a thorough exam including retroflexion was performed.  Findings:   Prep satisfactory. Small polyp ablated via cold biopsy from hepatic flexure. Normal rectal mucosa. Small hemorrhoids below the dentate line.   Therapeutic/Diagnostic Maneuvers Performed:  See above  Complications:  None  Cecal Withdrawal Time:  10 minutes  Impression:  Examination performed to cecum. Single small polyp ablated via cold biopsy from hepatic flexure. Small external hemorrhoids.  Recommendations:  Standard instructions given. I will contact patient with biopsy results and further recommendations.  LisaNAJEEB Torres  07/01/2013 11:14 AM  CC: Dr. [name], MD & Dr. Rayne Du ref. provider  found

## 2013-07-01 NOTE — H&P (Signed)
Lisa Torres is an 68 y.o. female.   Chief Complaint: Patient is here for colonoscopy. HPI: Patient is 68 year old African female who is here for surveillance colonoscopy. She denies abdominal pain change in bowel habits or rectal bleeding. Patient's last colonoscopy was in November 2009 with removal of tubular adenoma from her ascending colon. Family history significant for CRC in her mother was 35 at the time of diagnosis and diet follow 6 he is later of unrelated causes.  Past Medical History  Diagnosis Date  . Breast cancer 11/2011  . Radiation 04/2012    33 treatments for breast cancer  . Hypertension   . Thyroid disease   . GERD (gastroesophageal reflux disease)     Past Surgical History  Procedure Laterality Date  . Breast lumpectomy Left 12/2011  . Abdominal hysterectomy      partial  . Cholecystectomy    . Tubal ligation      Family History  Problem Relation Age of Onset  . Cancer Mother     colon cancer  . Cancer Father     throat cancer   Social History:  reports that she quit smoking about 14 years ago. Her smoking use included Cigarettes. She has a 9 pack-year smoking history. She has never used smokeless tobacco. She reports that she does not drink alcohol or use illicit drugs.  Allergies:  Allergies  Allergen Reactions  . Hydrocodone   . Tramadol     Hallucinations, abd pain    Medications Prior to Admission  Medication Sig Dispense Refill  . amLODipine (NORVASC) 5 MG tablet Take 5 mg by mouth every morning.       Marland Kitchen anastrozole (ARIMIDEX) 1 MG tablet Take 1 mg by mouth every morning.       . calcium carbonate (OS-CAL) 600 MG TABS tablet Take 600 mg by mouth 2 (two) times daily with a meal.      . Cholecalciferol (VITAMIN D-3) 1000 UNITS CAPS Take 1,000 Units by mouth 2 (two) times daily.      . cyclobenzaprine (FLEXERIL) 10 MG tablet Take 10 mg by mouth daily as needed. For muscle spasms      . esomeprazole (NEXIUM) 40 MG capsule Take 1 capsule (40  mg total) by mouth daily.  30 capsule  2  . levothyroxine (SYNTHROID, LEVOTHROID) 88 MCG tablet Take 88 mcg by mouth daily before breakfast.      . LORazepam (ATIVAN) 1 MG tablet Take 1 mg by mouth at bedtime as needed for sleep.       . naproxen sodium (ALEVE) 220 MG tablet Take 440 mg by mouth 2 (two) times daily as needed (Pain).      . polyethylene glycol (MIRALAX / GLYCOLAX) packet Take 17 g by mouth daily as needed for moderate constipation.       . ranitidine (ZANTAC) 150 MG tablet Take 150 mg by mouth 2 (two) times daily.      Marland Kitchen HYDROcodone-acetaminophen (NORCO/VICODIN) 5-325 MG per tablet Take 1-2 tablets by mouth every 6 (six) hours as needed for moderate pain.  20 tablet  0    No results found for this or any previous visit (from the past 48 hour(s)). No results found.  ROS  Blood pressure 177/90, pulse 82, temperature 97.9 F (36.6 C), temperature source Oral, resp. rate 18, height 5' 1.5" (1.562 m), weight 149 lb (67.586 kg), SpO2 99.00%. Physical Exam  Constitutional: She appears well-developed and well-nourished.  HENT:  Mouth/Throat: Oropharynx is clear and  moist.  Eyes: Conjunctivae are normal. No scleral icterus.  Neck: No thyromegaly present.  Cardiovascular: Normal rate, regular rhythm and normal heart sounds.   No murmur heard. Respiratory: Effort normal and breath sounds normal.  GI: Soft. She exhibits no distension and no mass. There is no tenderness.  Musculoskeletal: She exhibits no edema.  Lymphadenopathy:    She has no cervical adenopathy.  Neurological: She is alert.  Skin: Skin is warm and dry.     Assessment/Plan History of colonic adenoma. Family history of CRC. Surveillance colonoscopy.  REHMAN,NAJEEB U 07/01/2013, 10:30 AM

## 2013-07-01 NOTE — Discharge Instructions (Signed)
Resume usual medications and diet. °No driving for 24 hours. °Physician will contact you with biopsy results. ° °Colonoscopy, Care After °Refer to this sheet in the next few weeks. These instructions provide you with information on caring for yourself after your procedure. Your health care provider may also give you more specific instructions. Your treatment has been planned according to current medical practices, but problems sometimes occur. Call your health care provider if you have any problems or questions after your procedure. °WHAT TO EXPECT AFTER THE PROCEDURE  °After your procedure, it is typical to have the following: °· A small amount of blood in your stool. °· Moderate amounts of gas and mild abdominal cramping or bloating. °HOME CARE INSTRUCTIONS °· Do not drive, operate machinery, or sign important documents for 24 hours. °· You may shower and resume your regular physical activities, but move at a slower pace for the first 24 hours. °· Take frequent rest periods for the first 24 hours. °· Walk around or put a warm pack on your abdomen to help reduce abdominal cramping and bloating. °· Drink enough fluids to keep your urine clear or pale yellow. °· You may resume your normal diet as instructed by your health care provider. Avoid heavy or fried foods that are hard to digest. °· Avoid drinking alcohol for 24 hours or as instructed by your health care provider. °· Only take over-the-counter or prescription medicines as directed by your health care provider. °· If a tissue sample (biopsy) was taken during your procedure: °· Do not take aspirin or blood thinners for 7 days, or as instructed by your health care provider. °· Do not drink alcohol for 7 days, or as instructed by your health care provider. °· Eat soft foods for the first 24 hours. °SEEK MEDICAL CARE IF: °You have persistent spotting of blood in your stool 2 3 days after the procedure. °SEEK IMMEDIATE MEDICAL CARE IF: °· You have more than a small  spotting of blood in your stool. °· You pass large blood clots in your stool. °· Your abdomen is swollen (distended). °· You have nausea or vomiting. °· You have a fever. °· You have increasing abdominal pain that is not relieved with medicine. °Document Released: 01/16/2004 Document Revised: 03/24/2013 Document Reviewed: 02/08/2013 °[employer]® Patient Information ©2014 [employer]. ° °

## 2013-07-05 ENCOUNTER — Encounter (HOSPITAL_COMMUNITY): Payer: Self-pay | Admitting: Internal Medicine

## 2013-07-05 DIAGNOSIS — Z853 Personal history of malignant neoplasm of breast: Secondary | ICD-10-CM | POA: Diagnosis not present

## 2013-07-08 ENCOUNTER — Encounter (INDEPENDENT_AMBULATORY_CARE_PROVIDER_SITE_OTHER): Payer: Self-pay | Admitting: *Deleted

## 2013-08-19 DIAGNOSIS — G250 Essential tremor: Secondary | ICD-10-CM | POA: Diagnosis not present

## 2013-08-19 DIAGNOSIS — E039 Hypothyroidism, unspecified: Secondary | ICD-10-CM | POA: Diagnosis not present

## 2013-08-19 DIAGNOSIS — K2100 Gastro-esophageal reflux disease with esophagitis, without bleeding: Secondary | ICD-10-CM | POA: Diagnosis not present

## 2013-08-19 DIAGNOSIS — F411 Generalized anxiety disorder: Secondary | ICD-10-CM | POA: Diagnosis not present

## 2013-08-19 DIAGNOSIS — I10 Essential (primary) hypertension: Secondary | ICD-10-CM | POA: Diagnosis not present

## 2013-08-19 DIAGNOSIS — M5430 Sciatica, unspecified side: Secondary | ICD-10-CM | POA: Diagnosis not present

## 2013-08-24 ENCOUNTER — Encounter: Payer: Self-pay | Admitting: Neurology

## 2013-08-24 ENCOUNTER — Encounter (INDEPENDENT_AMBULATORY_CARE_PROVIDER_SITE_OTHER): Payer: Self-pay

## 2013-08-24 ENCOUNTER — Ambulatory Visit (INDEPENDENT_AMBULATORY_CARE_PROVIDER_SITE_OTHER): Payer: Medicare Other | Admitting: Neurology

## 2013-08-24 VITALS — BP 154/87 | HR 88 | Ht 62.0 in | Wt 145.0 lb

## 2013-08-24 DIAGNOSIS — G20A1 Parkinson's disease without dyskinesia, without mention of fluctuations: Secondary | ICD-10-CM | POA: Diagnosis not present

## 2013-08-24 DIAGNOSIS — R413 Other amnesia: Secondary | ICD-10-CM | POA: Diagnosis not present

## 2013-08-24 DIAGNOSIS — R269 Unspecified abnormalities of gait and mobility: Secondary | ICD-10-CM | POA: Diagnosis not present

## 2013-08-24 DIAGNOSIS — G20 Parkinson's disease: Secondary | ICD-10-CM | POA: Diagnosis not present

## 2013-08-24 DIAGNOSIS — Z79899 Other long term (current) drug therapy: Secondary | ICD-10-CM | POA: Diagnosis not present

## 2013-08-24 MED ORDER — PRAMIPEXOLE DIHYDROCHLORIDE 1 MG PO TABS: 1.0000 mg | ORAL_TABLET | Freq: Three times a day (TID) | ORAL | Status: DC

## 2013-08-24 MED ORDER — PRAMIPEXOLE DIHYDROCHLORIDE 0.25 MG PO TABS
ORAL_TABLET | ORAL | Status: DC
Start: 2013-08-24 — End: 2013-11-02

## 2013-08-24 NOTE — Progress Notes (Signed)
PATIENT: Lisa Torres DOB: 1946-03-14  HISTORICAL  Lisa Torres is a 68 years old right-handed female, accompanied by her husband and sister, referred by her primary care physician Dr. Consuello Masse for evaluation right hand tremor, change of her gait posture,.  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  Her gait has changes, become slower,.   She has no anosmia, when she gets up quickly, she has lightheadedness, she is taking ativan as needed for insomnia, she has excessive movement during her sleep.   She denies significant memory trouble, she was getting tapering dose of prednisone, 20 mg, 3 tablets 5 days followed by 2 tablets 5 days followed by 1 tablets for 5 days for unclear reasons,  REVIEW OF SYSTEMS: Full 14 system review of systems performed and notable only for insomnia,  ALLERGIES: Allergies  Allergen Reactions  . Hydrocodone   . Tramadol     Hallucinations, abd pain    HOME MEDICATIONS: Current Outpatient Prescriptions on File Prior to Visit  Medication Sig Dispense Refill  . amLODipine (NORVASC) 5 MG tablet Take 5 mg by mouth every morning.       Marland Kitchen anastrozole (ARIMIDEX) 1 MG tablet Take 1 mg by mouth every morning.       . calcium carbonate (OS-CAL) 600 MG TABS tablet Take 600 mg by mouth 2 (two) times daily with a meal.      . Cholecalciferol (VITAMIN D-3) 1000 UNITS CAPS Take 1,000 Units by mouth 2 (two) times daily.      . cyclobenzaprine (FLEXERIL) 10 MG tablet Take 10 mg by mouth daily as needed. For muscle  spasms      . esomeprazole (NEXIUM) 40 MG capsule Take 1 capsule (40 mg total) by mouth daily.  30 capsule  2  . HYDROcodone-acetaminophen (NORCO/VICODIN) 5-325 MG per tablet Take 1-2 tablets by mouth every 6 (six) hours as needed for moderate pain.  20 tablet  0  . levothyroxine (SYNTHROID, LEVOTHROID) 88 MCG tablet Take 88 mcg by mouth daily before breakfast.      . LORazepam (ATIVAN) 1 MG tablet Take 1 mg by mouth at bedtime as needed for sleep.       . naproxen sodium (ALEVE) 220 MG tablet Take 440 mg by mouth 2 (two) times daily as needed (Pain).      . polyethylene glycol (MIRALAX / GLYCOLAX) packet Take 17 g by mouth daily as needed for moderate constipation.       . ranitidine (ZANTAC) 150 MG tablet Take 150 mg by mouth 2 (two) times daily.       No current facility-administered medications on file prior to visit.    PAST MEDICAL HISTORY: Past Medical History  Diagnosis Date  . Breast cancer left 11/2011  . Radiation 04/2012    33 treatments for breast cancer  . Hypertension   . Thyroid disease   . GERD (gastroesophageal reflux disease)     PAST SURGICAL HISTORY: Past Surgical History  Procedure Laterality Date  . Breast lumpectomy Left 12/2011  . Abdominal hysterectomy      partial  . Cholecystectomy    .  Tubal ligation    . Colonoscopy N/A 07/01/2013    Procedure: COLONOSCOPY;  Surgeon: Rogene Houston, MD;  [facility]: [facility];  Service: Endoscopy;  Laterality: N/A;  67    FAMILY HISTORY: Family History  Problem Relation Age of Onset  . Cancer Mother     colon cancer  . Cancer Father     throat cancer    SOCIAL HISTORY:  History   Social History  . Marital Status: Married    Spouse Name: Marcello Moores    Number of Children: 3  . Years of Education: 95 th   Occupational History    Retired as custodian   Social History Main Topics  . Smoking status: Former Smoker -- 0.50 packs/day for 18 years    Types: Cigarettes    Quit date: 09/30/1998  . Smokeless  tobacco: Never Used  . Alcohol Use: No  . Drug Use: No  . Sexual Activity: Not on file   Other Topics Concern  . Not on file   Social History Narrative   Patient lives at home with her husband Marcello Moores).    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   Right handed    PHYSICAL EXAM   Filed Vitals:   08/24/13 1331  BP: 154/87  Pulse: 88  Height: 5\' 2"  (1.575 m)  Weight: 145 lb (65.772 kg)    Not recorded    Body mass index is 26.51 kg/(m^2).   Generalized: In no acute distress  Neck: Supple, no carotid bruits   Cardiac: Regular rate rhythm  Pulmonary: Clear to auscultation bilaterally  Musculoskeletal: No deformity  Neurological examination  Mentation: Alert oriented to time, place, history taking, and causual conversation, decreased facial expression, mild right hand tremor,  Cranial nerve II-XII: Pupils were equal round reactive to light. Extraocular movements were full.  Visual field were full on confrontational test. Bilateral fundi were sharp.  Facial sensation and strength were normal. Hearing was intact to finger rubbing bilaterally. Uvula tongue midline.  Head turning and shoulder shrug and were normal and symmetric.Tongue protrusion into cheek strength was normal.  Motor: She has right hand resting tremor, she has moderate right more than left limb, and nuchal rigidity, early fatigability  with rapid wrist opening and closure, Sensory: Intact to fine touch, pinprick, preserved vibratory sensation, and proprioception at toes.  Coordination: Normal finger to nose, heel-to-shin bilaterally there was no truncal ataxia  Gait: Rising up from seated position  By pushing on chair arm, stooped forward, stiff, decreased arm swing, small stride, mild retropulsed instability Romberg signs: Negative  Deep tendon reflexes: Brachioradialis 2/2, biceps 2/2, triceps 2/2, patellar 2/2, Achilles 2/2, plantar responses were flexor bilaterally.   DIAGNOSTIC DATA (LABS, IMAGING,  TESTING) - I reviewed patient records, labs, notes, testing and imaging myself where available.  Lab Results  Component Value Date   WBC 2.9* 06/21/2013   HGB 12.2 06/21/2013   HCT 36.9 06/21/2013   MCV 87.6 06/21/2013   PLT 265 06/21/2013      Component Value Date/Time   NA 140 06/21/2013 1719   K 4.5 06/21/2013 1719   CL 101 06/21/2013 1719   CO2 27 06/21/2013 1719   GLUCOSE 87 06/21/2013 1719   BUN 10 06/21/2013 1719   CREATININE 0.95 06/21/2013 1719   CALCIUM 10.0 06/21/2013 1719   PROT 7.9 06/21/2013 1719   ALBUMIN 4.1 06/21/2013 1719   AST 25 06/21/2013 1719   ALT 53* 06/21/2013 1719   ALKPHOS 102 06/21/2013 1719  BILITOT 0.5 06/21/2013 1719   GFRNONAA 61* 06/21/2013 1719   GFRAA 70* 06/21/2013 1719    ASSESSMENT AND PLAN  ERIS DAREZZO is a 68 y.o. female presenting a year history of right hand resting tremor, on examination, she was noted to have right more than left bradykinesia, rigidity, retropulsed instability, smaller stride, mild masked face,  1. Most consistent with idiopathic Parkinson's disease  2. Complete evaluation with MRI of the brain, laboratory evaluations 3. I will start at home Mirapex, titrating to 1 mg 3 times a day 4. Return to clinic in 2 months 5, tapering off prednisone quickly  Lisa Torres, M.D. Ph.D.  [facility] [address] [place], [place] [address] [phone]

## 2013-08-26 LAB — THYROID PANEL WITH TSH
Free Thyroxine Index: 2.9 (ref 1.2–4.9)
T3 Uptake Ratio: 31 % (ref 24–39)
T4, Total: 9.5 ug/dL (ref 4.5–12.0)
TSH: 0.507 u[IU]/mL (ref 0.450–4.500)

## 2013-08-26 LAB — ANA W/REFLEX IF POSITIVE: Anti Nuclear Antibody(ANA): NEGATIVE

## 2013-08-26 LAB — HIV ANTIBODY (ROUTINE TESTING W REFLEX)
HIV 1/HIV 2 AB: NONREACTIVE
HIV 1/O/2 Abs-Index Value: <1 (ref <1.00)

## 2013-08-26 LAB — VITAMIN B12: VITAMIN B 12: 677 pg/mL (ref 211–946)

## 2013-08-26 LAB — SEDIMENTATION RATE: Sed Rate: 22 mm/hr (ref 0–40)

## 2013-08-26 LAB — C-REACTIVE PROTEIN: CRP: 0.4 mg/L (ref 0.0–4.9)

## 2013-08-26 LAB — RPR: RPR: NONREACTIVE

## 2013-08-27 NOTE — Progress Notes (Signed)
Quick Note:  Shared normal labs per Dr Rhea Belton findings, she verbalized understanding ______

## 2013-09-01 ENCOUNTER — Ambulatory Visit
Admission: RE | Admit: 2013-09-01 | Discharge: 2013-09-01 | Disposition: A | Discharge status: Home / Self Care | Payer: Medicare Other | Source: Ambulatory Visit | Attending: Neurology | Admitting: Neurology

## 2013-09-01 DIAGNOSIS — G20 Parkinson's disease: Secondary | ICD-10-CM | POA: Diagnosis not present

## 2013-09-01 MED ORDER — GADOBENATE DIMEGLUMINE 529 MG/ML IV SOLN
13.0000 mL | Freq: Once | INTRAVENOUS | Status: AC | PRN
  Administered 2013-09-01: 13 mL via INTRAVENOUS

## 2013-09-03 ENCOUNTER — Telehealth: Payer: Self-pay | Admitting: Neurology

## 2013-09-03 NOTE — Telephone Encounter (Signed)
I have reviewed MRI of her brain, please call patient, essentially normal study

## 2013-09-03 NOTE — Telephone Encounter (Signed)
Called pt to inform her that her MRI results were normal and if she has any other problems, questions or concerns to call the office. Pt verbalized understanding.

## 2013-09-30 ENCOUNTER — Encounter: Payer: Self-pay | Admitting: Neurology

## 2013-09-30 ENCOUNTER — Ambulatory Visit (INDEPENDENT_AMBULATORY_CARE_PROVIDER_SITE_OTHER): Payer: Medicare Other | Admitting: Neurology

## 2013-09-30 VITALS — BP 135/80 | HR 59 | Ht 62.0 in | Wt 146.0 lb

## 2013-09-30 DIAGNOSIS — I10 Essential (primary) hypertension: Secondary | ICD-10-CM

## 2013-09-30 DIAGNOSIS — E039 Hypothyroidism, unspecified: Secondary | ICD-10-CM | POA: Diagnosis not present

## 2013-09-30 DIAGNOSIS — G20 Parkinson's disease: Secondary | ICD-10-CM

## 2013-09-30 MED ORDER — RASAGILINE MESYLATE 1 MG PO TABS: 1.0000 mg | ORAL_TABLET | Freq: Every day | ORAL | Status: DC

## 2013-09-30 MED ORDER — PRAMIPEXOLE DIHYDROCHLORIDE 1 MG PO TABS: 1.5000 mg | ORAL_TABLET | Freq: Three times a day (TID) | ORAL | Status: DC

## 2013-09-30 NOTE — Progress Notes (Signed)
PATIENT: Lisa Torres DOB: [birth year]  HISTORICAL  LATOIA EYSTER is a 68 years old right-handed female, accompanied by her husband and sister, referred by her primary care physician Dr. Consuello Masse for evaluation right hand tremor, change of her gait posture,.  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  Her gait has changes, become slower,.   She has no anosmia, when she gets up quickly, she has lightheadedness, she is taking ativan as needed for insomnia, she has excessive movement during her sleep.   She denies significant memory trouble, she was getting tapering dose of prednisone, 20 mg, 3 tablets 5 days followed by 2 tablets 5 days followed by 1 tablets for 5 days for unclear reasons  UPDATE April 16th 2015: Her findings are consistent with idiopathic Parkinson's disease, She is taking mirapex 78m tid, it helped her right leg pain, gait difficulty, no significant side effect, we have review of MRI of the brain together, essentially normal, laboratory evaluation showed normal and negative B12, RPR, TSH, C-reactive protein, ESR, ANA, HIV,     REVIEW OF SYSTEMS: Full 14 system review of systems performed and notable only for vivid dreams, insomnia   ALLERGIES: Allergies  Allergen Reactions  . Hydrocodone   . Tramadol     Hallucinations, abd pain    HOME MEDICATIONS: Current Outpatient Prescriptions on File Prior to Visit  Medication Sig Dispense Refill  . amLODipine (NORVASC) 5 MG tablet Take 5 mg by mouth every morning.        .Marland Kitchenanastrozole (ARIMIDEX) 1 MG tablet Take 1 mg by mouth every morning.       . calcium carbonate (OS-CAL) 600 MG TABS tablet Take 600 mg by mouth 2 (two) times daily with a meal.      . Cholecalciferol (VITAMIN D-3) 1000 UNITS CAPS Take 1,000 Units by mouth 2 (two) times daily.      . cyclobenzaprine (FLEXERIL) 10 MG tablet Take 10 mg by mouth daily as needed. For muscle spasms      . levothyroxine (SYNTHROID, LEVOTHROID) 88 MCG tablet Take 88 mcg by mouth daily before breakfast.      . LORazepam (ATIVAN) 1 MG tablet Take 1 mg by mouth at bedtime as needed for sleep.       . naproxen sodium (ALEVE) 220 MG tablet Take 440 mg by mouth 2 (two) times daily as needed (Pain).      .Marland Kitchenomeprazole (PRILOSEC) 40 MG capsule 40 mg daily.      . polyethylene glycol (MIRALAX / GLYCOLAX) packet Take 17 g by mouth daily as needed for moderate constipation.       . pramipexole (MIRAPEX) 0.25 MG tablet One tablet 3 times a day for a one-week, then 2 tablets 3 times a day, for one week, 3 tablets 3 times a day for one week, 4 tablets 3 times a day  300 tablet  0  . pramipexole (MIRAPEX) 1 MG tablet Take 1 tablet (1 mg total) by mouth 3 (three) times daily.  90 tablet  12   No current facility-administered medications on file prior to visit.  PAST MEDICAL HISTORY: Past Medical History  Diagnosis Date  . Breast cancer left 11/2011  . Radiation 04/2012    33 treatments for breast cancer  . Hypertension   . Thyroid disease   . GERD (gastroesophageal reflux disease)     PAST SURGICAL HISTORY: Past Surgical History  Procedure Laterality Date  . Breast lumpectomy Left 12/2011  . Abdominal hysterectomy      partial  . Cholecystectomy    . Tubal ligation    . Colonoscopy N/A 07/01/2013    Procedure: COLONOSCOPY;  Surgeon: Rogene Houston, MD;  [facility]: [facility];  Service: Endoscopy;  Laterality: N/A;  59    FAMILY HISTORY: Family History  Problem Relation Age of Onset  . Cancer Mother 93     colon cancer  . Cancer Father 36    throat cancer    SOCIAL HISTORY:  History   Social History  . Marital Status: Married    Spouse Name: Marcello Moores    Number of Children: 3  . Years of Education: 65 th   Occupational History    Retired as custodian   Social History Main Topics  . Smoking status: Former Smoker -- 0.50 packs/day for 18 years    Types: Cigarettes    Quit date: 09/30/1998  . Smokeless tobacco: Never Used  . Alcohol Use: No  . Drug Use: No  . Sexual Activity: Not on file   Other Topics Concern  . Not on file   Social History Narrative   Patient lives at home with her husband Marcello Moores).    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   Right handed    PHYSICAL EXAM   Filed Vitals:   09/30/13 1428  BP: 135/80  Pulse: 59  Height: '5\' 2"'  (1.575 m)  Weight: 146 lb (66.225 kg)    Not recorded    Body mass index is 26.7 kg/(m^2).   Generalized: In no acute distress  Neck: Supple, no carotid bruits   Cardiac: Regular rate rhythm  Pulmonary: Clear to auscultation bilaterally  Musculoskeletal: No deformity  Neurological examination  Mentation: Alert oriented to time, place, history taking, and causual conversation, decreased facial expression,  there was no resting tremor noticed today   Cranial nerve II-XII: Pupils were equal round reactive to light. Extraocular movements were full.  Visual field were full on confrontational test. Bilateral fundi were sharp.  Facial sensation and strength were normal. Hearing was intact to finger rubbing bilaterally. Uvula tongue midline.  Head turning and shoulder shrug and were normal and symmetric.Tongue protrusion into cheek strength was normal.  Motor: She has right hand resting tremor, she has moderate right more than left limb, and nuchal rigidity, early fatigability  with rapid wrist opening and closure, Sensory: Intact to fine touch, pinprick, preserved vibratory sensation, and proprioception at toes.  Coordination:  Normal finger to nose, heel-to-shin bilaterally there was no truncal ataxia  Gait: Rising up from seated position  By pushing on chair arm, stooped forward, stiff, decreased arm swing, small stride, mild retropulsed instability Romberg signs: Negative  Deep tendon reflexes: Brachioradialis 2/2, biceps 2/2, triceps 2/2, patellar  3/3 , Achilles 2/2, plantar responses were flexor bilaterally.   DIAGNOSTIC DATA (LABS, IMAGING, TESTING) - I reviewed patient records, labs, notes, testing and imaging myself where available.  Lab Results  Component Value Date   WBC 2.9* 06/21/2013   HGB 12.2 06/21/2013   HCT 36.9 06/21/2013   MCV 87.6 06/21/2013   PLT 265 06/21/2013  Component Value Date/Time   NA 140 06/21/2013 1719   K 4.5 06/21/2013 1719   CL 101 06/21/2013 1719   CO2 27 06/21/2013 1719   GLUCOSE 87 06/21/2013 1719   BUN 10 06/21/2013 1719   CREATININE 0.95 06/21/2013 1719   CALCIUM 10.0 06/21/2013 1719   PROT 7.9 06/21/2013 1719   ALBUMIN 4.1 06/21/2013 1719   AST 25 06/21/2013 1719   ALT 53* 06/21/2013 1719   ALKPHOS 102 06/21/2013 1719   BILITOT 0.5 06/21/2013 1719   GFRNONAA 61* 06/21/2013 1719   GFRAA 70* 06/21/2013 1719    ASSESSMENT AND PLAN  IDAMAE COCCIA is a 68 y.o. female presenting a year history of right hand resting tremor, on examination, she was noted to have right more than left bradykinesia, rigidity, retropulsed instability, smaller stride, mild masked face,Normal MRI of the brain, laboratory evaluations,   1. Most consistent with idiopathic Parkinson's disease  2. Increase Mirapex to 1 mg one and half tablets 3 times a day 3.  Azilect 1 mg daily   4. Physical therapy 5. Return to clinic with Hoyle Sauer in 3 months, if she continue to have gait difficulty, may consider low dose of Sinemet 25/100 mg 3 times a day, she has significant hyperreflexia, possibility including spondylitic cervical myelopathy, she denies incontinence, no lower extremity paresthesia at this point, if she has  worsening gait difficulty, may also consider MRI of cervical spine for evaluation  Marcial Pacas, M.D. Ph.D.  [facility] [address] [place], [place] [address] [phone]

## 2013-10-11 ENCOUNTER — Telehealth: Payer: Self-pay | Admitting: Neurology

## 2013-10-11 NOTE — Telephone Encounter (Signed)
Patient called to state that Dr. Krista Blue told her she would send a referral out for her to have therapy but patient has not heard from anyone yet. Patient also states that Dr. Krista Blue increased the dosage of her Pramipexole medication and she will be needing a refill of it.

## 2013-10-11 NOTE — Telephone Encounter (Signed)
Called pt to inform her that her referral for physical therapy had been sent to [facility] and that she needed to contact them to inquire about an appt. I gave the pt the number to [facility] and pt stated that she would be contacting them. I advised the pt that if she has any other problems, questions or concerns to call the office. Pt verbalized understanding.

## 2013-10-11 NOTE — Telephone Encounter (Signed)
Rx for new dose of meds was sent to [facility] when patient was in for OV.  I called and spoke with the patient.  She is aware.

## 2013-10-20 ENCOUNTER — Ambulatory Visit (HOSPITAL_COMMUNITY)
Admission: RE | Admit: 2013-10-20 | Discharge: 2013-10-20 | Disposition: A | Discharge status: Home / Self Care | Payer: Medicare Other | Source: Ambulatory Visit | Attending: Neurology | Admitting: Neurology

## 2013-10-20 DIAGNOSIS — IMO0001 Reserved for inherently not codable concepts without codable children: Secondary | ICD-10-CM | POA: Insufficient documentation

## 2013-10-20 DIAGNOSIS — R52 Pain, unspecified: Secondary | ICD-10-CM | POA: Diagnosis not present

## 2013-10-20 DIAGNOSIS — M79609 Pain in unspecified limb: Secondary | ICD-10-CM | POA: Diagnosis not present

## 2013-10-20 DIAGNOSIS — G20 Parkinson's disease: Secondary | ICD-10-CM | POA: Diagnosis not present

## 2013-10-20 DIAGNOSIS — R209 Unspecified disturbances of skin sensation: Secondary | ICD-10-CM | POA: Diagnosis not present

## 2013-10-20 DIAGNOSIS — G20A1 Parkinson's disease without dyskinesia, without mention of fluctuations: Secondary | ICD-10-CM | POA: Insufficient documentation

## 2013-10-20 DIAGNOSIS — R262 Difficulty in walking, not elsewhere classified: Secondary | ICD-10-CM | POA: Diagnosis not present

## 2013-10-20 DIAGNOSIS — I10 Essential (primary) hypertension: Secondary | ICD-10-CM | POA: Diagnosis not present

## 2013-10-20 NOTE — Evaluation (Signed)
Physical Therapy Evaluation  Patient Details  Name: Lisa Torres MRN: [id number] Date of Birth: [birth year]  Today's Date: 10/20/2013 Time: 0940-1015 PT Time Calculation (min): 35 min   Charges: 1 Evaluation, 1005-1015 therapeutic Exercise          Visit#: 1 of 16  Re-eval: 11/19/13 Assessment Diagnosis: Difficulty walking secondary to decreased LE strength and stiffness.Marland Kitchen  Next MD Visit: Krista Blue, July 2015 Prior Therapy: no  Authorization: Medicare    Authorization Time Period:    Authorization Visit#:  1 of   18  Past Medical History:  Past Medical History  Diagnosis Date  . Breast cancer 11/2011  . Radiation 04/2012    33 treatments for breast cancer  . Hypertension   . Thyroid disease   . GERD (gastroesophageal reflux disease)    Past Surgical History:  Past Surgical History  Procedure Laterality Date  . Breast lumpectomy Left 12/2011  . Abdominal hysterectomy      partial  . Cholecystectomy    . Tubal ligation    . Colonoscopy N/A 07/01/2013    Procedure: COLONOSCOPY;  Surgeon: Rogene Houston, MD;  [facility];  Service: Endoscopy;  Laterality: N/A;  1030    Subjective Symptoms/Limitations Symptoms: Patient has intermittent numbness and tingling in bilateral LE, and intermittent pain in legs.  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 She noticed right hand tremor for one year, since 2014, getting worse, she also complains of right shoulder achiness, stiffness, sore in right leg, has difficulty with steps, left knee stinging sensation, she complains of difficulty sleepy because of pain.  Her gait has changes, become slower Limitations: Walking How long can you sit comfortably?: intermittent soreness in Lt low back. no difficulty typically   How long can you stand comfortably?: 15-36minutes How long can you walk comfortably?: 5-17minutes Patient Stated Goals: Patient wants to be able top walk longer and faster with her LE's feeling better.  Pain Assessment Currently in Pain?: Yes Pain Score: 7  Pain Location: Knee Pain Orientation: Right;Left Pain Radiating Towards: Pain/soreness in bilateral LE radiating from hips to to dorsum of foot Pain Relieving Factors: pain relieving positions Effect of Pain on Daily Activities: sit to stand,  Balance Screening Balance Screen Has the patient fallen in the past 6 months: No  Sensation/Coordination/Flexibility/Functional Tests Flexibility Thomas: Positive Obers: Positive 90/90: Positive (+, Rt 35, Lt 45) Functional Tests Functional Tests: Walking: limited hip extension, shortened stride length, limited hip IR/ER, limited knee flexion.  Functional Tests: Positive Ely  Assessment RLE AROM (degrees) Right Hip Extension: 0 Right Hip Flexion: 90 Right Ankle Dorsiflexion: 10 RLE Strength Right Hip Flexion: 3+/5 Right Hip Extension: 2/5 Right Hip External Rotation : 20 Right Hip Internal Rotation : 35 Right Hip ABduction: 2+/5 Right Knee Flexion: 4/5 Right Knee Extension: 4/5 LLE AROM (degrees) Left Hip Extension: 0 Left Hip Flexion: 90 Left Hip External Rotation : 15 Left Hip Internal Rotation : 30 Left Ankle Dorsiflexion: 10 LLE Strength Left Hip Flexion: 3/5 Left Hip Extension: 2+/5 Left Hip ABduction: 2+/5 Left Knee Flexion: 4/5 Left Knee Extension: 4/5  Exercise/Treatments Stretches Active Hamstring Stretch: 3 reps;20 seconds Quad Stretch: 3 reps;20 seconds   Physical Therapy Assessment and Plan PT Assessment and Plan Clinical Impression Statement: Patient displays difficulty walking secodnary to progression of parkinsons, though no tremors  were noted through out gait sequence. Patient's LE pain attributed to limited LE mobility secondary to muscle tightness  and muscle tremos during performance of stretching. Following stretching of quadraceps and hamstring muscles patient displays improved gait anddecreased pain. Patient will contineue to benfit from physcial therapy to  improve gait mechanics, LE strength and muscle mobility to  decrease pain, improve walking endurance and improve independence.  Pt will benefit from skilled therapeutic intervention in order to improve on the following deficits: Abnormal gait;Decreased balance;Decreased activity tolerance;Decreased mobility;Decreased range of motion;Difficulty walking;Decreased strength;Impaired flexibility;Pain Rehab Potential: Good Clinical Impairments Affecting Rehab Potential: Parkinsons and very good response to initial treatment,  PT Frequency: Min 2X/week PT Duration: 8 weeks PT Treatment/Interventions: Gait training;Stair training;Functional mobility training;Therapeutic activities;Balance training;Therapeutic exercise;Patient/family education;Manual techniques PT Plan: Physical therapy for 2x a week for 8 weeks with inital focus on improving LE mobility, hip strength and gait mechanics to increase gait efficiency.     Goals PT Short Term Goals Time to Complete Short Term Goals: 3 weeks PT Short Term Goal 1: Patient will be able to ambulate 26mintues without pain >3/10 to perform short grocery shopping trips PT Short Term Goal 2: Patient will be able to perform sit to stand transfer without using UEs PT Short Term Goal 3: Patient will have negative Ely test for quariceps flexibility indicating improved ability to flex knee so patient my sit lower surface as strength improves PT Short Term Goal 4: Increase hip extension to 10 degrees to increase stride length PT Long Term Goals Time to Complete Long Term Goals: 8 weeks PT Long Term Goal 1: Patient will be able to ambulate 39mintues without pain >3/10 to perform short grocery shopping trips PT Long Term Goal 2: Patient will perform 5x sit to  stand in <15seconds Long Term Goal 3: Patient's hip extension/abduction to impove to 4/5 with manual muscle test to improve ability to single leg stand >3 seconds  Problem List Patient Active Problem List   Diagnosis Date Noted  . Parkinson disease 08/24/2013  . Hypothyroid 05/05/2013  . Hypertension 05/05/2013    PT - End of Session Activity Tolerance: Patient tolerated treatment well General Behavior During Therapy: WFL for tasks assessed/performed PT Plan of Care PT Home Exercise Plan: Hamstings and quadraceps stretch 3x 20seconds with a belt/rope.  PT Patient Instructions: trwice daily.   GP Functional Assessment Tool Used: FOTO 49% limited Functional Limitation: Mobility: Walking and moving around Mobility: Walking and Moving Around Current Status (E9937): At least 40 percent but less than 60 percent impaired, limited or restricted Mobility: Walking and Moving Around Goal Status [phone]): At least 20 percent but less than 40 percent impaired, limited or restricted  Rowin Bayron R Jammy Plotkin 10/20/2013, 1:13 PM  Physician Documentation Your signature is required to indicate approval of the treatment plan as stated above.  Please sign and either send electronically or make a copy of this report for your files and return this physician signed original.   Please mark one 1.__approve of plan  2. ___approve of plan with the following conditions.   ______________________________                                                          _____________________ Physician Signature  Date  

## 2013-10-25 ENCOUNTER — Ambulatory Visit (HOSPITAL_COMMUNITY)
Admission: RE | Admit: 2013-10-25 | Discharge: 2013-10-25 | Disposition: A | Discharge status: Home / Self Care | Payer: Medicare Other | Source: Ambulatory Visit | Attending: Neurology | Admitting: Neurology

## 2013-10-25 NOTE — Progress Notes (Signed)
Physical Therapy Treatment Patient Details  Name: Lisa Torres MRN: [id number] Date of Birth: 11/14/1945  Today's Date: 10/25/2013 Time: 9390-3009 PT Time Calculation (min): 49 min Charges:  therex 45  Visit#: 2 of 16  Re-eval: 11/19/13 Assessment Diagnosis: Difficulty walking secondary to decreased LE strength and stiffness.Marland Kitchen  Next MD Visit: Krista Blue, July 2015 Prior Therapy: no Authorization: Medicare   Subjective: Symptoms/Limitations Symptoms: Pt stated complaince with HEP without question.  Reported pain continues Bil LE 8/10 Pain Assessment Currently in Pain?: Yes Pain Score: 8  Pain Location: Leg Pain Orientation: Right;Left   Exercise/Treatments Standing Rocker Board: 2 minutes;Limitations Rocker Board Limitations: R/L and A/P 1 HHA Other Standing Knee Exercises: 3D hip excursion 10x Seated Other Seated Knee Exercises: STS no HHA 5 reps Sidelying Hip ABduction: Both;10 reps Prone  Hip Extension: Both;10 reps Other Prone Exercises: hip IR/ER manual Passive stretch 2X30" each Nustep 8 minutes level 2 hills #2 UE/LE     Physical Therapy Assessment and Plan PT Assessment and Plan Clinical Impression Statement: Instructed with new therex and required therapist facilitation for correct form and posturing. Noted weakness in hip musculature.  Encouraged arm swing with gait.  concluded session wtih nustep.  Pt required multiple rest breaks to complete due to fatique.   PT Plan: Physical therapy for 2x a week for 8 weeks with inital focus on improving LE mobility, hip strength and gait mechanics to increase gait efficiency. Continue to increase activity tolerance and reciprocal motions for UE/LE.     Problem List Patient Active Problem List   Diagnosis Date Noted  . Difficulty walking 10/20/2013  . Parkinson disease 08/24/2013  . Hypothyroid 05/05/2013  . Hypertension 05/05/2013    PT - End of Session Activity Tolerance: Patient tolerated treatment  well General Behavior During Therapy: WFL for tasks assessed/performed PT Plan of Care PT Home Exercise Plan: Hamstings and quadraceps stretch 3x 20seconds with a belt/rope.  PT Patient Instructions: trwice daily.    Teena Irani, PTA/CLT 10/25/2013, 12:37 PM

## 2013-10-27 ENCOUNTER — Ambulatory Visit (HOSPITAL_COMMUNITY)
Admission: RE | Admit: 2013-10-27 | Discharge: 2013-10-27 | Disposition: A | Discharge status: Home / Self Care | Payer: Medicare Other | Source: Ambulatory Visit | Attending: Neurology | Admitting: Neurology

## 2013-10-27 NOTE — Progress Notes (Signed)
Physical Therapy Treatment Patient Details  Name: Lisa Torres MRN: [id number] Date of Birth: 08-25-1945  Today's Date: 10/27/2013 Time: 1125-1210 PT Time Calculation (min): 45 min Charge: TE [id number]  Visit#: 3 of 16  Re-eval: 11/19/13 Assessment Diagnosis: Difficulty walking secondary to decreased LE strength and stiffness.Marland Kitchen  Next MD Visit: Krista Blue, July 2015 Prior Therapy: no  Authorization: Medicare  Authorization Time Period:    Authorization Visit#: 3 of 10   Subjective: Symptoms/Limitations Symptoms: Pt stated complaince with HEP.  Pain scale 6-7/10 Lt  Pain Assessment Currently in Pain?: Yes Pain Score: 7  Pain Location: Calf Pain Orientation: Left;Lateral  Objective:  Exercise/Treatments Stretches Gastroc Stretch: 5 reps;30 seconds;Limitations Gastroc Stretch Limitations: Slant board 3 reps neutral, 2 reps IR for increased stretch lateral calf Aerobic Stationary Bike: nustep 8 minutes level 3 hills resistnace level 2 UE/LE; SPM goal 80-100 Standing Lateral Step Up: Both;10 reps;Hand Hold: 2;Step Height: 4" Forward Step Up: Both;10 reps;Hand Hold: 1;Step Height: 4" Functional Squat: Limitations Functional Squat Limitations: 10 reps 3D hip excursion Rocker Board: 2 minutes;Limitations Rocker Board Limitations: R/L and A/P 1 HHA Gait Training: Gait training x 250 feet focus Other Standing Knee Exercises: 3D hip excursion 10x Other Standing Knee Exercises: Sidestepping and retro gait 1RT Seated Other Seated Knee Exercises: STS no HHA 5 reps with each foot behind     Physical Therapy Assessment and Plan PT Assessment and Plan Clinical Impression Statement: Progressed LE strengthening to standing activities for functional strengthening and hip mobility exercises to improve gait.  Pt with constant cueing for posture and to look up during all; standing exercises and cueing with gait training for heel to toe pattern and to increase stride length and UE swing  with gait.  Ended session with nustep to improve activity tolerance, strengthening and UE/LE sequencing.  Pt limited by fatigue, reports pain reduced to 4/10  in Lt calf following stretches. PT Plan: Continue with current POC to improve LE mobilty, hip strength and gait mechanics.  Continue sit to stands and stair training, balance activites to improve gait.      Goals PT Short Term Goals Time to Complete Short Term Goals: 3 weeks PT Short Term Goal 1: Patient will be able to ambulate 48mintues without pain >3/10 to perform short grocery shopping trips PT Short Term Goal 1 - Progress: Progressing toward goal PT Short Term Goal 2: Patient will be able to perform sit to stand transfer without using UEs PT Short Term Goal 2 - Progress: Progressing toward goal PT Short Term Goal 3: Patient will have negative Ely test for quariceps flexibility indicating improved ability to flex knee so patient my sit lower surface as strength improves PT Short Term Goal 4 - Progress: Progressing toward goal PT Long Term Goals Time to Complete Long Term Goals: 8 weeks PT Long Term Goal 1: Patient will be able to ambulate 79mintues without pain >3/10 to perform short grocery shopping trips PT Long Term Goal 2: Patient will perform 5x sit to stand in <15seconds Long Term Goal 3: Patient's hip extension/abduction to impove to 4/5 with manual muscle test to improve ability to single leg stand >3 seconds Long Term Goal 3 Progress: Progressing toward goal  Problem List Patient Active Problem List   Diagnosis Date Noted  . Difficulty walking 10/20/2013  . Parkinson disease 08/24/2013  . Hypothyroid 05/05/2013  . Hypertension 05/05/2013    PT - End of Session Activity Tolerance: Patient tolerated treatment well General Behavior During Therapy: [facility] for  tasks assessed/performed  GP    Aldona Lento 10/27/2013, 12:09 PM

## 2013-11-02 ENCOUNTER — Encounter (HOSPITAL_COMMUNITY): Payer: Medicare Other | Attending: Hematology and Oncology

## 2013-11-02 ENCOUNTER — Encounter (HOSPITAL_COMMUNITY): Payer: Self-pay

## 2013-11-02 ENCOUNTER — Ambulatory Visit (HOSPITAL_COMMUNITY)
Admission: RE | Admit: 2013-11-02 | Discharge: 2013-11-02 | Disposition: A | Discharge status: Home / Self Care | Payer: Medicare Other | Source: Ambulatory Visit | Attending: Neurology | Admitting: Neurology

## 2013-11-02 VITALS — BP 164/66 | HR 64 | Temp 97.6°F | Resp 20 | Wt 146.8 lb

## 2013-11-02 DIAGNOSIS — C50919 Malignant neoplasm of unspecified site of unspecified female breast: Secondary | ICD-10-CM

## 2013-11-02 DIAGNOSIS — Z09 Encounter for follow-up examination after completed treatment for conditions other than malignant neoplasm: Secondary | ICD-10-CM | POA: Diagnosis not present

## 2013-11-02 DIAGNOSIS — Z923 Personal history of irradiation: Secondary | ICD-10-CM | POA: Insufficient documentation

## 2013-11-02 DIAGNOSIS — K219 Gastro-esophageal reflux disease without esophagitis: Secondary | ICD-10-CM | POA: Diagnosis not present

## 2013-11-02 DIAGNOSIS — D708 Other neutropenia: Secondary | ICD-10-CM

## 2013-11-02 DIAGNOSIS — G20 Parkinson's disease: Secondary | ICD-10-CM | POA: Insufficient documentation

## 2013-11-02 DIAGNOSIS — M1990 Unspecified osteoarthritis, unspecified site: Secondary | ICD-10-CM | POA: Diagnosis not present

## 2013-11-02 DIAGNOSIS — G20A1 Parkinson's disease without dyskinesia, without mention of fluctuations: Secondary | ICD-10-CM | POA: Insufficient documentation

## 2013-11-02 DIAGNOSIS — F430 Acute stress reaction: Secondary | ICD-10-CM | POA: Diagnosis not present

## 2013-11-02 DIAGNOSIS — E039 Hypothyroidism, unspecified: Secondary | ICD-10-CM | POA: Diagnosis not present

## 2013-11-02 DIAGNOSIS — Z87891 Personal history of nicotine dependence: Secondary | ICD-10-CM | POA: Insufficient documentation

## 2013-11-02 DIAGNOSIS — I10 Essential (primary) hypertension: Secondary | ICD-10-CM | POA: Diagnosis not present

## 2013-11-02 DIAGNOSIS — Z853 Personal history of malignant neoplasm of breast: Secondary | ICD-10-CM | POA: Insufficient documentation

## 2013-11-02 DIAGNOSIS — Z9010 Acquired absence of unspecified breast and nipple: Secondary | ICD-10-CM | POA: Insufficient documentation

## 2013-11-02 DIAGNOSIS — D704 Cyclic neutropenia: Secondary | ICD-10-CM | POA: Insufficient documentation

## 2013-11-02 LAB — CBC WITH DIFFERENTIAL/PLATELET
BASOS ABS: 0 10*3/uL (ref 0.0–0.1)
BASOS PCT: 0 % (ref 0–1)
Eosinophils Absolute: 0.1 10*3/uL (ref 0.0–0.7)
Eosinophils Relative: 5 % (ref 0–5)
HCT: 37.9 % (ref 36.0–46.0)
HEMOGLOBIN: 12.5 g/dL (ref 12.0–15.0)
Lymphocytes Relative: 37 % (ref 12–46)
Lymphs Abs: 1 10*3/uL (ref 0.7–4.0)
MCH: 28.3 pg (ref 26.0–34.0)
MCHC: 33 g/dL (ref 30.0–36.0)
MCV: 85.9 fL (ref 78.0–100.0)
MONOS PCT: 7 % (ref 3–12)
Monocytes Absolute: 0.2 10*3/uL (ref 0.1–1.0)
NEUTROS ABS: 1.4 10*3/uL — AB (ref 1.7–7.7)
Neutrophils Relative %: 51 % (ref 43–77)
PLATELETS: 278 10*3/uL (ref 150–400)
RBC: 4.41 MIL/uL (ref 3.87–5.11)
RDW: 12.1 % (ref 11.5–15.5)
WBC: 2.7 10*3/uL — ABNORMAL LOW (ref 4.0–10.5)

## 2013-11-02 LAB — COMPREHENSIVE METABOLIC PANEL
ALBUMIN: 4.8 g/dL (ref 3.5–5.2)
ALK PHOS: 118 U/L — AB (ref 39–117)
ALT: 15 U/L (ref 0–35)
AST: 21 U/L (ref 0–37)
BUN: 8 mg/dL (ref 6–23)
CHLORIDE: 100 meq/L (ref 96–112)
CO2: 28 mEq/L (ref 19–32)
Calcium: 10.4 mg/dL (ref 8.4–10.5)
Creatinine, Ser: 0.81 mg/dL (ref 0.50–1.10)
GFR calc Af Amer: 85 mL/min — ABNORMAL LOW (ref >90)
GFR calc non Af Amer: 73 mL/min — ABNORMAL LOW (ref >90)
Glucose, Bld: 81 mg/dL (ref 70–99)
POTASSIUM: 3.9 meq/L (ref 3.7–5.3)
SODIUM: 141 meq/L (ref 137–147)
TOTAL PROTEIN: 9.5 g/dL — AB (ref 6.0–8.3)
Total Bilirubin: 0.6 mg/dL (ref 0.3–1.2)

## 2013-11-02 NOTE — Progress Notes (Signed)
Lisa Torres  OFFICE PROGRESS NOTE  [name], MD [place] [address]  DIAGNOSIS: Breast cancer - Plan: CBC with Differential, Comprehensive metabolic panel, CEA, Cancer antigen 27.29, acetaminophen (TYLENOL) 500 MG tablet, MM Digital Diagnostic Bilat, CBC with Differential, Comprehensive metabolic panel, CEA, Cancer antigen 27.29, CBC with Differential, Comprehensive metabolic panel, CEA, Cancer antigen 27.29  Hypothyroid - Plan: CBC with Differential, Comprehensive metabolic panel, CEA, Cancer antigen 27.29, acetaminophen (TYLENOL) 500 MG tablet, MM Digital Diagnostic Bilat, CBC with Differential, Comprehensive metabolic panel, CEA, Cancer antigen 27.29, CBC with Differential, Comprehensive metabolic panel, CEA, Cancer antigen 27.29  Hypertension - Plan: CBC with Differential, Comprehensive metabolic panel, CEA, Cancer antigen 27.29, acetaminophen (TYLENOL) 500 MG tablet, MM Digital Diagnostic Bilat, CBC with Differential, Comprehensive metabolic panel, CEA, Cancer antigen 27.29, CBC with Differential, Comprehensive metabolic panel, CEA, Cancer antigen 27.29  Chief Complaint  Patient presents with  . Breast Cancer    Anastrozole 1 mg daily    CURRENT THERAPY: Anastrozole 1 mg daily plus calcium/vitamin D supplements.  INTERVAL HISTORY: Lisa Torres 68 y.o. female returns for followup of stage I left breast cancer while taking anastrozole 1 mg daily. She is concerned because of tenderness involving the left breast without lymphedema. Appetite is good. She does have vaginal dryness with no PND, orthopnea, or palpitations. She denies any diarrhea, constipation, melena, hematochezia, incontinence, joint pain, hot flashes, skin rash, headache, or seizures.  MEDICAL HISTORY: Past Medical History  Diagnosis Date  . Breast cancer 11/2011  . Radiation 04/2012    33 treatments for breast cancer  . Hypertension   . Thyroid  disease   . GERD (gastroesophageal reflux disease)     INTERIM HISTORY: has Hypothyroid; Hypertension; Parkinson disease; and Difficulty walking on her problem list.   Left-sided breast cancer, diagnosed on 01/07/2012, infiltrating duct cell carcinoma, 0.7 cm in size with associated noninvasive ductal neoplasia, ER and PR positive, HER-2/neu not over amplified, treated postoperatively after lumpectomy and sentinel node biopsy with radiotherapy ending in November of 2013 followed by treatment with anastrozole along with calcium and vitamin D supplements  ALLERGIES:  is allergic to hydrocodone and tramadol.  MEDICATIONS: has a current medication list which includes the following prescription(s): acetaminophen, amlodipine, anastrozole, calcium carbonate, vitamin d-3, levothyroxine, lorazepam, naproxen sodium, omeprazole, polyethylene glycol, pramipexole, rasagiline, and cyclobenzaprine.  SURGICAL HISTORY:  Past Surgical History  Procedure Laterality Date  . Breast lumpectomy Left 12/2011  . Abdominal hysterectomy      partial  . Cholecystectomy    . Tubal ligation    . Colonoscopy N/A 07/01/2013    Procedure: COLONOSCOPY;  Surgeon: Rogene Houston, MD;  Location: [facility];  Service: Endoscopy;  Laterality: N/A;  56    FAMILY HISTORY: family history includes Cancer (age of onset: 28) in her mother; Cancer (age of onset: 1) in her father.  SOCIAL HISTORY:  reports that she quit smoking about 15 years ago. Her smoking use included Cigarettes. She has a 9 pack-year smoking history. She has never used smokeless tobacco. She reports that she does not drink alcohol or use illicit drugs.  REVIEW OF SYSTEMS:  Other than that discussed above is noncontributory.  PHYSICAL EXAMINATION: ECOG PERFORMANCE STATUS: 1 - Symptomatic but completely ambulatory  Blood pressure 164/66, pulse 64, temperature 97.6 F (36.4 C), temperature source Oral, resp. rate 20, weight 146 lb 12.8 oz (66.588  kg).  GENERAL:alert, no distress and comfortable  SKIN: skin color, texture, turgor are normal, no rashes or significant lesions EYES: PERLA; Conjunctiva are pink and non-injected, sclera clear SINUSES: No redness or tenderness over maxillary or ethmoid sinuses OROPHARYNX:no exudate, no erythema on lips, buccal mucosa, or tongue. NECK: supple, thyroid normal size, non-tender, without nodularity. No masses CHEST: Status post left breast lumpectomy with no evidence of masses in either breast. No erythema. LYMPH:  no palpable lymphadenopathy in the cervical, axillary or inguinal LUNGS: clear to auscultation and percussion with normal breathing effort HEART: regular rate & rhythm and no murmurs. ABDOMEN:abdomen soft, non-tender and normal bowel sounds MUSCULOSKELETAL:no cyanosis of digits and no clubbing. Range of motion normal. . No lymphedema. NEURO: alert & oriented x 3 with fluent speech, no focal motor/sensory deficits   LABORATORY DATA: Office Visit on 11/02/2013  Component Date Value Ref Range Status  . WBC 11/02/2013 2.7* 4.0 - 10.5 K/uL Final  . RBC 11/02/2013 4.41  3.87 - 5.11 MIL/uL Final  . Hemoglobin 11/02/2013 12.5  12.0 - 15.0 g/dL Final  . HCT 11/02/2013 37.9  36.0 - 46.0 % Final  . MCV 11/02/2013 85.9  78.0 - 100.0 fL Final  . MCH 11/02/2013 28.3  26.0 - 34.0 pg Final  . MCHC 11/02/2013 33.0  30.0 - 36.0 g/dL Final  . RDW 11/02/2013 12.1  11.5 - 15.5 % Final  . Platelets 11/02/2013 278  150 - 400 K/uL Final  . Neutrophils Relative % 11/02/2013 51  43 - 77 % Final  . Neutro Abs 11/02/2013 1.4* 1.7 - 7.7 K/uL Final  . Lymphocytes Relative 11/02/2013 37  12 - 46 % Final  . Lymphs Abs 11/02/2013 1.0  0.7 - 4.0 K/uL Final  . Monocytes Relative 11/02/2013 7  3 - 12 % Final  . Monocytes Absolute 11/02/2013 0.2  0.1 - 1.0 K/uL Final  . Eosinophils Relative 11/02/2013 5  0 - 5 % Final  . Eosinophils Absolute 11/02/2013 0.1  0.0 - 0.7 K/uL Final  . Basophils Relative 11/02/2013  0  0 - 1 % Final  . Basophils Absolute 11/02/2013 0.0  0.0 - 0.1 K/uL Final  . Sodium 11/02/2013 141  137 - 147 mEq/L Final  . Potassium 11/02/2013 3.9  3.7 - 5.3 mEq/L Final  . Chloride 11/02/2013 100  96 - 112 mEq/L Final  . CO2 11/02/2013 28  19 - 32 mEq/L Final  . Glucose, Bld 11/02/2013 81  70 - 99 mg/dL Final  . BUN 11/02/2013 8  6 - 23 mg/dL Final  . Creatinine, Ser 11/02/2013 0.81  0.50 - 1.10 mg/dL Final  . Calcium 11/02/2013 10.4  8.4 - 10.5 mg/dL Final  . Total Protein 11/02/2013 9.5* 6.0 - 8.3 g/dL Final  . Albumin 11/02/2013 4.8  3.5 - 5.2 g/dL Final  . AST 11/02/2013 21  0 - 37 U/L Final  . ALT 11/02/2013 15  0 - 35 U/L Final  . Alkaline Phosphatase 11/02/2013 118* 39 - 117 U/L Final  . Total Bilirubin 11/02/2013 0.6  0.3 - 1.2 mg/dL Final  . GFR calc non Af Amer 11/02/2013 73* >90 mL/min Final  . GFR calc Af Amer 11/02/2013 85* >90 mL/min Final   Comment: (NOTE)                          The eGFR has been calculated using the CKD EPI equation.  This calculation has not been validated in all clinical situations.                          eGFR's persistently <90 mL/min signify possible Chronic Kidney                          Disease.    PATHOLOGY: No new pathology.  Urinalysis No results found for this basename: colorurine,  appearanceur,  labspec,  phurine,  glucoseu,  hgbur,  bilirubinur,  ketonesur,  proteinur,  urobilinogen,  nitrite,  leukocytesur    RADIOGRAPHIC STUDIES: No results found.  ASSESSMENT:  #1. Stage I left breast cancer, status post lumpectomy, sentinel node biopsy, external beam radiotherapy, currently tolerating anastrozole well.  #2. Pancreatic cleft.  #3. Chronic idiopathic cyclic neutropenia.  #4. Hypothyroidism, on treatment.  #5. Hypertension, controlled.  #6. Gastroesophageal reflux disease, on treatment.  #7. Degenerative joint disease, on treatment    PLAN:  #1. Continue anastrozole 1 mg daily along with  self breast examination. #2. Mammogram at [facility] in July 2015. #3. Followup in 6 months with CBC, chem profile, CEA, CA 27-29, and TSH.   All questions were answered. The patient knows to call the clinic with any problems, questions or concerns. We can certainly see the patient much sooner if necessary.   I spent 25 minutes counseling the patient face to face. The total time spent in the appointment was 30 minutes.    Farrel Gobble, MD 11/02/2013 1:53 PM  DISCLAIMER:  This note was dictated with voice recognition software.  Similar sounding words can inadvertently be transcribed inaccurately and may not be corrected upon review.

## 2013-11-02 NOTE — Progress Notes (Signed)
Lisa Torres presented for labwork. Labs per MD order drawn via Peripheral Line 23 gauge needle inserted in RT AC  Good blood return present. Procedure without incident.  Needle removed intact. Patient tolerated procedure well.

## 2013-11-02 NOTE — Patient Instructions (Signed)
[facility] Discharge Instructions  RECOMMENDATIONS MADE BY THE CONSULTANT AND ANY TEST RESULTS WILL BE SENT TO YOUR REFERRING PHYSICIAN.  EXAM FINDINGS BY THE PHYSICIAN TODAY AND SIGNS OR SYMPTOMS TO REPORT TO CLINIC OR PRIMARY PHYSICIAN:   Mammogram in August:  Tuesday January 18 2014 @ 10:30. Arrive in Radiology department @ 10:15.  Labs in 6 months prior to MD visit:   Monday November 16 @ 10:30  Office visit with Dr. Barnet Glasgow in 6 months: Tuesday November 17 @ 11:30  4 lab tests drawn today. You can call tomorrow for results.    Thank you for choosing [facility] to provide your oncology and hematology care.  To afford each patient quality time with our providers, please arrive at least 15 minutes before your scheduled appointment time.  With your help, our goal is to use those 15 minutes to complete the necessary work-up to ensure our physicians have the information they need to help with your evaluation and healthcare recommendations.    Effective January 1st, 2014, we ask that you re-schedule your appointment with our physicians should you arrive 10 or more minutes late for your appointment.  We strive to give you quality time with our providers, and arriving late affects you and other patients whose appointments are after yours.    Again, thank you for choosing [facility].  Our hope is that these requests will decrease the amount of time that you wait before being seen by our physicians.       _____________________________________________________________  Should you have questions after your visit to [facility], please contact our office at [phone] between the hours of 8:30 a.m. and 5:00 p.m.  Voicemails left after 4:30 p.m. will not be returned until the following business day.  For prescription refill requests, have your pharmacy contact our office with your prescription refill request.

## 2013-11-02 NOTE — Progress Notes (Signed)
Physical Therapy Treatment Patient Details  Name: Lisa Torres MRN: [id number] Date of Birth: 09/10/1945  Today's Date: 11/02/2013 Time: 1350-1445 PT Time Calculation (min): 55 min Charge: TE [id number]  Visit#: 4 of 16  Re-eval: 11/19/13 Assessment Diagnosis: Difficulty walking secondary to decreased LE strength and stiffness.Marland Kitchen  Next MD Visit: Krista Blue, July 2015 Prior Therapy: no  Authorization: Medicare  Authorization Time Period:    Authorization Visit#: 4 of 10   Subjective: Symptoms/Limitations Symptoms: Pt stated Bil knee pain from weakness. Pain Assessment Currently in Pain?: Yes Pain Score: 7  Pain Location: Knee Pain Orientation: Left;Right  Objective:   Exercise/Treatments Aerobic Stationary Bike: nustep 10 minutes level 3 hills resistnace level 2 UE/LE; SPM goal 80-100 Standing Heel Raises: 15 reps;Limitations Heel Raises Limitations: toe raises with 2 finger assistance Lateral Step Up: Both;10 reps;Hand Hold: 0;Step Height: 4" Functional Squat: Limitations Functional Squat Limitations: 3D hip excursion Rocker Board: 2 minutes;Limitations Rocker Board Limitations: R/L and A/P 1 HHA Gait Training: Gait training x 250 feet focus Other Standing Knee Exercises: 3D hip excursion 15x Other Standing Knee Exercises: hip abduction against wall 5reps with therapist facilitation  Seated Other Seated Knee Exercises: STS 10 reps no HHA from normal chair height Supine Bridges: Both;10 reps Sidelying Hip ABduction: Both;10 reps Clams: 5x 10" with ball between feet to decrease compensation    Physical Therapy Assessment and Plan PT Assessment and Plan Clinical Impression Statement: Progressed LE strengthening exercises with main focus on gluteal strengthening in muptiple positions to improve gait mechanics.  Began standing abduction against wall to reduce compensation with TFL with therapist facilitation to improve form.  Added ball between feet with clam exercises  to reduce compensation and improve form.  Pt able to demonstrate 10 sit to stands from normal chair height with no HHA or assistance required.  Pt limited by fatigue at end of session, required rest breaks with nustep due to decreased activity tolerance.  Pt reports pain resolved to 0/10 Bil knees at end of session PT Plan: Continue with current POC to improve LE mobilty, hip strength and gait mechanics.  Continue sit to stands and stair training, balance activites to improve gait.      Goals PT Short Term Goals Time to Complete Short Term Goals: 3 weeks PT Short Term Goal 1: Patient will be able to ambulate 42mintues without pain >3/10 to perform short grocery shopping trips PT Short Term Goal 1 - Progress: Progressing toward goal PT Short Term Goal 2: Patient will be able to perform sit to stand transfer without using UEs PT Short Term Goal 2 - Progress: Progressing toward goal PT Short Term Goal 3: Patient will have negative Ely test for quariceps flexibility indicating improved ability to flex knee so patient my sit lower surface as strength improves PT Short Term Goal 4: Increase hip extension to 10 degrees to increase stride length PT Short Term Goal 4 - Progress: Progressing toward goal PT Long Term Goals Time to Complete Long Term Goals: 8 weeks PT Long Term Goal 1: Patient will be able to ambulate 51mintues without pain >3/10 to perform short grocery shopping trips PT Long Term Goal 2: Patient will perform 5x sit to stand in <15seconds Long Term Goal 3: Patient's hip extension/abduction to impove to 4/5 with manual muscle test to improve ability to single leg stand >3 seconds Long Term Goal 3 Progress: Progressing toward goal  Problem List Patient Active Problem List   Diagnosis Date Noted  . Difficulty walking 10/20/2013  .  Parkinson disease 08/24/2013  . Hypothyroid 05/05/2013  . Hypertension 05/05/2013    PT - End of Session Activity Tolerance: Patient tolerated treatment  well General Behavior During Therapy: [facility] for tasks assessed/performed  GP    Aldona Lento 11/02/2013, 3:01 PM

## 2013-11-03 LAB — CANCER ANTIGEN 27.29: CA 27.29: 34 U/mL (ref 0–39)

## 2013-11-03 LAB — CEA: CEA: 1 ng/mL (ref 0.0–5.0)

## 2013-11-04 ENCOUNTER — Ambulatory Visit (HOSPITAL_COMMUNITY)
Admission: RE | Admit: 2013-11-04 | Discharge: 2013-11-04 | Disposition: A | Discharge status: Home / Self Care | Payer: Medicare Other | Source: Ambulatory Visit | Attending: Neurology | Admitting: Neurology

## 2013-11-04 NOTE — Progress Notes (Addendum)
Physical Therapy Treatment Patient Details  Name: Lisa Torres MRN: [id number] Date of Birth: [birth year]  Today's Date: 11/04/2013 Time: [phone] PT Time Calculation (min): 47 min Charge:  There es [phone] Visit#: 5 of 16  Re-eval: 11/19/13    Authorization: Medicare  Authorization Time Period:    Authorization Visit#: 5 of 10   Subjective: Symptoms/Limitations Symptoms: Pt states she is doing her HEP Pain Assessment Pain Score: 4  Pain Location: Knee Pain Orientation: Right;Left      Exercise/Treatments Stretches Gastroc Stretch: 2 reps;60 seconds Gastroc Stretch Limitations: slant board. Aerobic Stationary Bike: nustep L4 x 10:00     Standing Heel Raises: 15 reps Lateral Step Up: 15 reps Functional Squat: 15 reps Functional Squat Limitations: 3D hip excursion x 3  Rocker Board: 2 minutes SLS with Vectors: 3 sec hold x 3 Gait Training: heel toe gt  Other Standing Knee Exercises: hip abduction x 15 Seated Other Seated Knee Exercises: STS x 10     Physical Therapy Assessment and Plan PT Assessment and Plan Clinical Impression Statement: Pt ambulating with B knee flexed, flat footed and shortened stride.  Worked with pt on proper gt and encouraged her to complete this at home as an exercise.  Added vector stances to improve balance. No rest breaks needed and stayed on LE for 40 min straight. PT Plan: begin lunges next treatment.    Goals  therapy  Problem List Patient Active Problem List   Diagnosis Date Noted  . Difficulty walking 10/20/2013  . Parkinson disease 08/24/2013  . Hypothyroid 05/05/2013  . Hypertension 05/05/2013       GP    Leeroy Cha 11/04/2013, 10:21 AM

## 2013-11-09 ENCOUNTER — Ambulatory Visit (HOSPITAL_COMMUNITY)
Admission: RE | Admit: 2013-11-09 | Discharge: 2013-11-09 | Disposition: A | Discharge status: Home / Self Care | Payer: Medicare Other | Source: Ambulatory Visit | Attending: Neurology | Admitting: Neurology

## 2013-11-09 NOTE — Progress Notes (Signed)
Physical Therapy Treatment Patient Details  Name: Lisa Torres MRN: [id number] Date of Birth: 1945/11/03  Today's Date: 11/09/2013 Time: 1015-1102 PT Time Calculation (min): 47 min  Visit#: 6 of 16  Re-eval: 11/19/13 Authorization: Medicare  Authorization Visit#: 6 of 10  Charges:  therex 45  Subjective: Symptoms/Limitations Symptoms: Pt states she is not hurting today,  just cramping/soreness into Lt lateral thigh/knee area.  Pain Assessment Currently in Pain?: No/denies   Exercise/Treatments Stretches Gastroc Stretch: 3 reps;30 seconds Gastroc Stretch Limitations: slant board. Aerobic Stationary Bike: nustep L4 x 10:00 Standing Heel Raises: 15 reps Heel Raises Limitations: toe raises with 2 finger assistance Knee Flexion: 10 reps;Both Lateral Step Up: Both;15 reps;Step Height: 4";Hand Hold: 1 Functional Squat: 15 reps Functional Squat Limitations: 3D hip excursion  SLS with Vectors: 3 sec hold x 3 Other Standing Knee Exercises: hip abduction x 15 Seated Other Seated Knee Exercises: STS x 10 regular chair no HHA    Physical Therapy Assessment and Plan PT Assessment and Plan Clinical Impression Statement: Pt with continued improvment in stregnth and endurance noted by functional activity tolerance and decreased LE fatigue with activities.   Added forward lunges today with mulltimodal cues for form.   Pt without complaint of pain or fatigue, only requiring one rest break during session.  Improved gait quality as well, however continues to require therapist facilitation. PT Plan: Continue to progress LE strength and activity tolerance.     Problem List Patient Active Problem List   Diagnosis Date Noted  . Difficulty walking 10/20/2013  . Parkinson disease 08/24/2013  . Hypothyroid 05/05/2013  . Hypertension 05/05/2013      Teena Irani, PTA/CLT 11/09/2013, 11:16 AM

## 2013-11-11 ENCOUNTER — Ambulatory Visit (HOSPITAL_COMMUNITY)
Admission: RE | Admit: 2013-11-11 | Discharge: 2013-11-11 | Disposition: A | Discharge status: Home / Self Care | Payer: Medicare Other | Source: Ambulatory Visit | Attending: Neurology | Admitting: Neurology

## 2013-11-11 NOTE — Progress Notes (Signed)
Physical Therapy Treatment Patient Details  Name: Lisa Torres MRN: [id number] Date of Birth: 1946/02/06  Today's Date: 11/11/2013 Time: 1015-1103 PT Time Calculation (min): 48 min Charge: TE [id number]  Visit#: 7 of 16  Re-eval: 11/19/13 Assessment Diagnosis: Difficulty walking secondary to decreased LE strength and stiffness.Marland Kitchen  Next MD Visit: Krista Blue, July 2015 Prior Therapy: no  Authorization: Medicare  Authorization Time Period:    Authorization Visit#: 7 of 10   Subjective: Symptoms/Limitations Symptoms: Pt stated Bil LE painful today Lt>Rt Pain Assessment Currently in Pain?: Yes Pain Score: 5   Objective:   Exercise/Treatments Stretches Gastroc Stretch: 3 reps;30 seconds Gastroc Stretch Limitations: slant board. Aerobic Stationary Bike: nustep L4 x 10:00 Standing Heel Raises: 15 reps Heel Raises Limitations: toe raises with 2 finger assistance Forward Lunges: Both;15 reps Side Lunges: Both;15 reps Lateral Step Up: Both;15 reps;Step Height: 4";Hand Hold: 1 Functional Squat: 15 reps Functional Squat Limitations: 3D hip excursion  Other Standing Knee Exercises: hip abduction with heel against wall x 15 Seated Other Seated Knee Exercises: STS x 10 regular chair no HHA     Physical Therapy Assessment and Plan PT Assessment and Plan Clinical Impression Statement: Pt progressing well towards functional strengthening and overall gait mechanics.  Increased ease with sit to stands with no HHA.  Added side lunges today with multimodal for form and technique.  Pt improving gait quality, however does continue to require cueing to increase stride length and heel to toe pattern.  Pt stated pain reduced to 2/10 Bil LE at end of session. PT Plan: Continue to progress LE strength and activity tolerance per PT POC.    Goals PT Short Term Goals Time to Complete Short Term Goals: 3 weeks PT Short Term Goal 1: Patient will be able to ambulate 3mintues without pain >3/10 to  perform short grocery shopping trips PT Short Term Goal 1 - Progress: Progressing toward goal PT Short Term Goal 2: Patient will be able to perform sit to stand transfer without using UEs PT Short Term Goal 2 - Progress: Progressing toward goal PT Short Term Goal 3: Patient will have negative Ely test for quariceps flexibility indicating improved ability to flex knee so patient my sit lower surface as strength improves PT Short Term Goal 4: Increase hip extension to 10 degrees to increase stride length PT Long Term Goals Time to Complete Long Term Goals: 8 weeks PT Long Term Goal 1: Patient will be able to ambulate 68mintues without pain >3/10 to perform short grocery shopping trips PT Long Term Goal 2: Patient will perform 5x sit to stand in <15seconds Long Term Goal 3: Patient's hip extension/abduction to impove to 4/5 with manual muscle test to improve ability to single leg stand >3 seconds  Problem List Patient Active Problem List   Diagnosis Date Noted  . Difficulty walking 10/20/2013  . Parkinson disease 08/24/2013  . Hypothyroid 05/05/2013  . Hypertension 05/05/2013    PT - End of Session Activity Tolerance: Patient tolerated treatment well General Behavior During Therapy: [facility] for tasks assessed/performed  GP    Aldona Lento 11/11/2013, 12:14 PM

## 2013-11-16 ENCOUNTER — Ambulatory Visit (HOSPITAL_COMMUNITY)
Admission: RE | Admit: 2013-11-16 | Discharge: 2013-11-16 | Disposition: A | Discharge status: Home / Self Care | Payer: Medicare Other | Source: Ambulatory Visit | Attending: Family Medicine | Admitting: Family Medicine

## 2013-11-16 DIAGNOSIS — R209 Unspecified disturbances of skin sensation: Secondary | ICD-10-CM | POA: Insufficient documentation

## 2013-11-16 DIAGNOSIS — G20 Parkinson's disease: Secondary | ICD-10-CM | POA: Insufficient documentation

## 2013-11-16 DIAGNOSIS — M79609 Pain in unspecified limb: Secondary | ICD-10-CM | POA: Insufficient documentation

## 2013-11-16 DIAGNOSIS — G20A1 Parkinson's disease without dyskinesia, without mention of fluctuations: Secondary | ICD-10-CM | POA: Insufficient documentation

## 2013-11-16 DIAGNOSIS — R52 Pain, unspecified: Secondary | ICD-10-CM | POA: Insufficient documentation

## 2013-11-16 DIAGNOSIS — R262 Difficulty in walking, not elsewhere classified: Secondary | ICD-10-CM | POA: Insufficient documentation

## 2013-11-16 DIAGNOSIS — I10 Essential (primary) hypertension: Secondary | ICD-10-CM | POA: Diagnosis not present

## 2013-11-16 DIAGNOSIS — IMO0001 Reserved for inherently not codable concepts without codable children: Secondary | ICD-10-CM | POA: Insufficient documentation

## 2013-11-16 NOTE — Progress Notes (Addendum)
Physical Therapy Treatment Patient Details  Name: Lisa Torres MRN: [id number] Date of Birth: 08-10-1945  Today's Date: 11/16/2013 Time:  3536- 1100     Charges: TherEx 1015-1100 Visit#: 8 of 16  Re-eval: 11/19/13 Assessment Diagnosis: Difficulty walking secondary to decreased LE strength and stiffness.Marland Kitchen  Next MD Visit: Krista Blue, July 2015 Prior Therapy: no  Authorization: Medicare  Authorization Visit#: 8 of 10   Subjective: Symptoms/Limitations Symptoms: Patient states having Lt LE knee pain with no Rt knee pain this session.  Pain Assessment Currently in Pain?: Yes Pain Score: 6  Pain Location: Knee Pain Orientation: Left (minimal on Rt)  Exercise/Treatments Stretches Active Hamstring Stretch: Limitations;4 reps;10 seconds Active Hamstring Stretch Limitations: 3way ITB Stretch: Limitations ITB Stretch Limitations: 10x 3 sec to 8" Gastroc Stretch: 3 reps;30 seconds Gastroc Stretch Limitations: slant board. Standing Side Lunges: Both;15 reps Functional Squat Limitations: 3D hip excursion  Other Standing Knee Exercises: Standing groin stretch to 8" box 4x 10seconds,    anterior lateral lunge walk 15x each  Other Standing Knee Exercises: Frontal plane squat walk 4-ft each way    Physical Therapy Assessment and Plan PT Assessment and Plan Clinical Impression Statement: Pt progressing well towards functional strengthening and overall gait mechanics. Increased ease with sit to stands with no HHA. Patient initially ambulated with decreased stride width and increased knee valgus; following gait training this was much improved with pain decreased to 3/10 (initially 8/10).  Side lunges continue to require multimodal cuing for form and technique. Pt improving gait quality, however does continue to require cueing to increase stride length and heel to toe pattern. Patient also responded well to iliotibial band and groin stretches. PT Plan: Continue to progress LE strength and  activity tolerance. Focus to be on impronving glut med/ max strength and frontal plane hip/knee mobility/stability during gait.     Goals PT Short Term Goals PT Short Term Goal 1: Patient will be able to ambulate 26mintues without pain >3/10 to perform short grocery shopping trips PT Short Term Goal 1 - Progress: Progressing toward goal PT Short Term Goal 2: Patient will be able to perform sit to stand transfer without using UEs PT Short Term Goal 2 - Progress: Progressing toward goal PT Short Term Goal 3: Patient will have negative Ely test for quariceps flexibility indicating improved ability to flex knee so patient my sit lower surface as strength improves PT Short Term Goal 3 - Progress: Progressing toward goal PT Short Term Goal 4: Increase hip extension to 10 degrees to increase stride length PT Short Term Goal 4 - Progress: Progressing toward goal PT Long Term Goals PT Long Term Goal 1: Patient will be able to ambulate 110mintues without pain >3/10 to perform short grocery shopping trips PT Long Term Goal 1 - Progress: Progressing toward goal PT Long Term Goal 2: Patient will perform 5x sit to stand in <15seconds PT Long Term Goal 2 - Progress: Progressing toward goal Long Term Goal 3: Patient's hip extension/abduction to impove to 4/5 with manual muscle test to improve ability to single leg stand >3 seconds Long Term Goal 3 Progress: Progressing toward goal  Problem List Patient Active Problem List   Diagnosis Date Noted  . Difficulty walking 10/20/2013  . Parkinson disease 08/24/2013  . Hypothyroid 05/05/2013  . Hypertension 05/05/2013   GP    Treshaun Carrico R Essa Wenk 11/16/2013, 11:13 AM

## 2013-11-18 ENCOUNTER — Ambulatory Visit (HOSPITAL_COMMUNITY)
Admission: RE | Admit: 2013-11-18 | Discharge: 2013-11-18 | Disposition: A | Discharge status: Home / Self Care | Payer: Medicare Other | Source: Ambulatory Visit | Attending: Neurology | Admitting: Neurology

## 2013-11-18 NOTE — Progress Notes (Signed)
Physical Therapy Treatment Patient Details  Name: Lisa Torres MRN: [id number] Date of Birth: [birth year]  Today's Date: 11/18/2013 Time: 1017-1108 PT Time Calculation (min): 51 min Charge: TE [id number]  Visit#: 9 of 16  Re-eval: 11/19/13 Assessment Diagnosis: Difficulty walking secondary to decreased LE strength and stiffness.Marland Kitchen  Next MD Visit: Krista Blue, July 2015 Prior Therapy: no  Authorization: Medicare  Authorization Time Period:    Authorization Visit#: 9 of 10   Subjective: Symptoms/Limitations Symptoms: Pt stated the weather change makes her feel weak, pain free today. Pain Assessment Currently in Pain?: No/denies  Objective:   Exercise/Treatments Stretches Active Hamstring Stretch: 3 reps;20 seconds;Limitations Active Hamstring Stretch Limitations: on 14 in box Gastroc Stretch: 3 reps;30 seconds Gastroc Stretch Limitations: slant board. Aerobic Stationary Bike: nustep L4 x 10:00 SPM goal 100. Standing Forward Lunges: Both;15 reps Terminal Knee Extension: Both;10 reps;Theraband Theraband Level (Terminal Knee Extension): Level 4 (Blue) Functional Squat: 15 reps Functional Squat Limitations: 3D hip excursion  Gait Training: Heel to toe pattern with cueing for knee extension Other Standing Knee Exercises: Abduciton against wall 10x Bil Seated Other Seated Knee Exercises: STS x 10 regular chair no HHA- goal met Supine Quad Sets: 10 reps     Physical Therapy Assessment and Plan PT Assessment and Plan Clinical Impression Statement: Pt progressing well towards functional strengthening and improving overall gait mechanics though still continues to require cueing for heel to toe pattern and full knee extension.  This session added quad strengthening exercises to improve knee extensin with gait.  Pt lacking 6 degrees extension Rt knee and 2 degrees Lt.  Pt instructed to add quad sets to HEP. Continued gluteal strengthening exercises with therapist facilitation to  improve form for maximum strengthening techniques. PT Plan: Re-eval next session.  Continue to progress LE strength and activity tolerance. Focus to be on impronving glut med/ max strength and frontal plane hip/knee mobility/stability during gait.     Goals PT Short Term Goals PT Short Term Goal 1: Patient will be able to ambulate 66mntues without pain >3/10 to perform short grocery shopping trips PT Short Term Goal 1 - Progress: Progressing toward goal PT Short Term Goal 2: Patient will be able to perform sit to stand transfer without using UEs PT Short Term Goal 2 - Progress: Met PT Short Term Goal 3: Patient will have negative Ely test for quariceps flexibility indicating improved ability to flex knee so patient my sit lower surface as strength improves PT Short Term Goal 3 - Progress: Progressing toward goal PT Short Term Goal 4: Increase hip extension to 10 degrees to increase stride length PT Short Term Goal 4 - Progress: Progressing toward goal PT Long Term Goals PT Long Term Goal 1: Patient will be able to ambulate 611mtues without pain >3/10 to perform short grocery shopping trips PT Long Term Goal 2: Patient will perform 5x sit to stand in <15seconds Long Term Goal 3: Patient's hip extension/abduction to impove to 4/5 with manual muscle test to improve ability to single leg stand >3 seconds  Problem List Patient Active Problem List   Diagnosis Date Noted  . Difficulty walking 10/20/2013  . Parkinson disease 08/24/2013  . Hypothyroid 05/05/2013  . Hypertension 05/05/2013    PT - End of Session Activity Tolerance: Patient tolerated treatment well [facility]  GP    CaAldona Lento/09/2013, 12:59 PM

## 2013-11-23 ENCOUNTER — Ambulatory Visit (HOSPITAL_COMMUNITY)
Admission: RE | Admit: 2013-11-23 | Discharge: 2013-11-23 | Disposition: A | Discharge status: Home / Self Care | Payer: Medicare Other | Source: Ambulatory Visit | Attending: Family Medicine | Admitting: Family Medicine

## 2013-11-23 NOTE — Progress Notes (Signed)
Physical Therapy Treatment Patient Details  Name: LYNDSY GILBERTO MRN: [id number] Date of Birth: 03-Oct-1945  Today's Date: 11/23/2013 Time: 1017-1110 PT Time Calculation (min): 53 min Charge: TE 1017-1100, MMT/ROM Measurement 1100-1110  Visit#: 10 of 18  Re-eval: 12/21/13   Authorization: Medicare  Authorization Visit#: 10 of 20   Subjective: Symptoms/Limitations Symptoms: Pt stated she was pain free, feels weak in legs.  Compliance with HEP.   How long can you sit comfortably?: intermittent soreness in Lt low back. no difficulty typically  How long can you stand comfortably?: 30 minutes (was 15-66mnutes) How long can you walk comfortably?: 30 minutes (was 5-19mutes) Pain Assessment Currently in Pain?: No/denies  Objective:   11/23/13 1000  Assessment  Diagnosis Difficulty walking secondary to decreased LE strength and stiffness.. Marland Kitchen Next MD Visit YaKrista BlueJuly 24th 2015  Prior Therapy no  RLE AROM (degrees)  Right Hip Extension 8 (was 0)  Right Hip Flexion (was 90)  Right Ankle Dorsiflexion (was 10)  Right Knee Extension -11  RLE Strength  Right Hip Extension 3/5 (was 2/5)  Right Hip ABduction 4/5 (was 2+/5)  Right Knee Flexion (4+/5 was 4/5)  Right Knee Extension 4/5 (was 4/5)  LLE AROM (degrees)  Left Hip Extension 6 (was 0)  Left Hip Flexion (was 90)  Left Hip External Rotation  (was 15)  Left Hip Internal Rotation  (was 30)  Left Knee Extension -3  LLE Strength  Left Hip Flexion (was 3/5)  Left Hip Extension 3+/5 (was 2+/5)  Left Hip ABduction 4/5 (was 2+/5)  Left Knee Flexion (4+/5 was 4/5)  Left Knee Extension 4/5 (was 4/5)   Exercise/Treatments Stretches Gastroc Stretch: 3 reps;30 seconds Gastroc Stretch Limitations: slant board. Aerobic Stationary Bike: nustep L4 x 10:00 SPM goal 100. Standing Heel Raises: Limitations Heel Raises Limitations: heel and toe walking 1RT Forward Lunges: Both;15 reps Side Lunges: Both;15 reps Functional  Squat: 20 reps Functional Squat Limitations: 3D hip excursion  Other Standing Knee Exercises: high march and hold 5x 5" on airex Seated Other Seated Knee Exercises: 5 STS 10" no HHA Supine Quad Sets: 10 reps Prone  Hamstring Curl: 10 reps Hip Extension: Both;10 reps   Physical Therapy Assessment and Plan PT Assessment and Plan Clinical Impression Statement: Session focus on improving gait mechnaics and functioanl strengthening.  Progressed to toe and heel walking for strengtheing, improve balance and to improve knee extension Bil.  Improved gait mechanics throughout session, continues to require cueing to increase stride length, heelt to toe pattern and to look up; no LOB through session.  Re-eval complete this session, pt improving overall LE strengthening, activtiy tolerance but continues to be limited AROM hip, knee and ankle ROM  Recommend continung OPPT to improve overall LE strength, activity tolerance and improve hip and knee extension to improve gait mechanics.   PT Plan: Recommend continuing OPPT for 4 more weeks to improve AROM, functional strengthening to improve activity tolerance.    Goals PT Short Term Goals PT Short Term Goal 1: Patient will be able to ambulate 3078mues without pain >3/10 to perform short grocery shopping trips PT Short Term Goal 1 - Progress: Met PT Short Term Goal 2: Patient will be able to perform sit to stand transfer without using UEs PT Short Term Goal 2 - Progress: Met PT Short Term Goal 3: Patient will have negative Ely test for quariceps flexibility indicating improved ability to flex knee so patient my sit lower surface as strength improves PT Short Term Goal  3 - Progress: Progressing toward goal PT Short Term Goal 4: Increase hip extension to 10 degrees to increase stride length (8 degrees) PT Short Term Goal 4 - Progress: Progressing toward goal PT Long Term Goals PT Long Term Goal 1: Patient will be able to ambulate 20mntues without pain  >3/10 to perform short grocery shopping trips PT Long Term Goal 1 - Progress: Progressing toward goal PT Long Term Goal 2: Patient will perform 5x sit to stand in <15seconds PT Long Term Goal 2 - Progress: Met Long Term Goal 3: Patient's hip extension/abduction to impove to 4/5 with manual muscle test to improve ability to single leg stand >3 seconds Long Term Goal 3 Progress: Progressing toward goal  Problem List Patient Active Problem List   Diagnosis Date Noted  . Difficulty walking 10/20/2013  . Parkinson disease 08/24/2013  . Hypothyroid 05/05/2013  . Hypertension 05/05/2013    PT - End of Session Activity Tolerance: Patient tolerated treatment well General Behavior During Therapy: [facility] for tasks assessed/performed  GP Functional Assessment Tool Used: FOTO 71% status, 29% limitation (was 49% limited) Functional Limitation: Mobility: Walking and moving around Mobility: Walking and Moving Around Current Status (([id number]: At least 20 percent but less than 40 percent impaired, limited or restricted Mobility: Walking and Moving Around Goal Status ([phone]: At least 20 percent but less than 40 percent impaired, limited or restricted  CAldona Lento6/02/2014, 2:07 PM  CDevona KonigPT DPT

## 2013-11-25 ENCOUNTER — Ambulatory Visit (HOSPITAL_COMMUNITY)
Admission: RE | Admit: 2013-11-25 | Discharge: 2013-11-25 | Disposition: A | Discharge status: Home / Self Care | Payer: Medicare Other | Source: Ambulatory Visit | Attending: Family Medicine | Admitting: Family Medicine

## 2013-11-25 NOTE — Progress Notes (Signed)
Physical Therapy Treatment Patient Details  Name: Lisa Torres MRN: [id number] Date of Birth: Jan 05, 1946  Today's Date: 11/25/2013 Time: 1100-1145 PT Time Calculation (min): 45 min    Charges: TherEx 1100-1130, Manual therapy 1130-1115 Visit#: 11 of  20  Re-eval: 12/21/13   Authorization: Medicare  Authorization Visit#: 11 of 20   Subjective: Symptoms/Limitations Symptoms: Patient states increased posterior knee pain today. Patient Stated Goals: Patient wants to be able top walk longer and faster with her LE's feeling better.  Pain Assessment Currently in Pain?: Yes Pain Score: 5  Pain Orientation: Right;Left (Rt worse than Lt)  Exercise/Treatments Stretches Active Hamstring Stretch: Limitations Active Hamstring Stretch Limitations: 5x 10seconds 3 way on 14 in box Quad Stretch: Limitations Quad Stretch Limitations: prone, with rope 4x 10seconds Hip Flexor Stretch: Limitations Hip Flexor Stretch Limitations: 10x 3 second hold with therapis asssist for knee extension Aerobic Stationary Bike: nustep L1 x 20minutes SPM goal 100.   Manual Therapy Manual Therapy: Joint mobilization Joint Mobilization: Functional manual reaction techiniques during lunging to improve Rt knee extenison , Soft tissue mobilization hamstrings  Physical Therapy Assessment and Plan PT Assessment and Plan Clinical Impression Statement: Patient arrived with complain of increased posterior knee pain and while walking demosntrated complete lack of knee extension secondary to posterior knee pain. Patient performed hamstring stretches with immediate improvement in pain followiing. Patient gait improved though she still lacked full  full knee extension during gait which following further assessment was attributed to limited quadraceps and hip flexor mobility. FOllowign further stretching and strengthenign exercises along with soft tissue mobilizationof hamstring and rectus femoris patient ddemosntrated  improved gait and decreased pain.  PT Plan: Conitnue to progress hip and knee mobilitiy 9specifically stretching hamstrigns, hip flexors, and quadraceps muscles). and progress strength as pain improves with focus on improvign glut strength anddepth of loading.     Goals    Problem List Patient Active Problem List   Diagnosis Date Noted  . Difficulty walking 10/20/2013  . Parkinson disease 08/24/2013  . Hypothyroid 05/05/2013  . Hypertension 05/05/2013    PT - End of Session Activity Tolerance: Patient tolerated treatment well General Behavior During Therapy: [facility] for tasks assessed/performed  GP    Mckinzee Spirito R 11/25/2013, 11:39 AM

## 2013-12-01 ENCOUNTER — Ambulatory Visit (HOSPITAL_COMMUNITY)
Admission: RE | Admit: 2013-12-01 | Discharge: 2013-12-01 | Disposition: A | Discharge status: Home / Self Care | Payer: Medicare Other | Source: Ambulatory Visit | Attending: Family Medicine | Admitting: Family Medicine

## 2013-12-01 NOTE — Progress Notes (Signed)
Physical Therapy Treatment Patient Details  Name: Lisa Torres MRN: [id number] Date of Birth: 1945/09/23  Today's Date: 12/01/2013 Time: 1016-1100 PT Time Calculation (min): 44 min Charge: TE 1018-1100  Visit#: 12 of 18  Re-eval: 12/21/13 Assessment Diagnosis: Difficulty walking secondary to decreased LE strength and stiffness.Marland Kitchen  Next MD Visit: Marin Olp Prior Therapy: no  Authorization: Medicare  Authorization Time Period:    Authorization Visit#: 12 of 20   Subjective: Symptoms/Limitations Symptoms: Pt stated she is pain free just weakness holding her back today. Pain Assessment Currently in Pain?: No/denies  Objective:   Exercise/Treatments Stretches Active Hamstring Stretch: Limitations;3 reps;20 seconds Active Hamstring Stretch Limitations: on 14in  box Quad Stretch: 2 reps;30 seconds;Limitations Quad Stretch Limitations: standing  Hip Flexor Stretch: 2 reps;20 seconds Gastroc Stretch: 3 reps;30 seconds Gastroc Stretch Limitations: slant board. Aerobic Stationary Bike: nustep L1 x 10 minutes SPM goal 100. Standing Forward Lunges: Both;15 reps Side Lunges: Both;15 reps Terminal Knee Extension: Both;10 reps;Theraband Theraband Level (Terminal Knee Extension): Level 4 (Blue) Functional Squat: 20 reps Functional Squat Limitations: 3D hip excursion     Physical Therapy Assessment and Plan PT Assessment and Plan Clinical Impression Statement: Session focus on improving hip mobilty with 3D hip excursion exercises, stretches for LE hip musculature and strengthening exercises primary focus on gluteal musculature and quadriceps to improve knee extension.  Improved gait mechanics at end of session with improved knee extensin with heel strike following stretches. PT Plan: Conitnue to progress hip and knee mobilitiy specifically stretching hamstrigns, hip flexors, and quadraceps muscles). and progress strength as pain improves with focus on improvign glut  strength anddepth of loading.     Goals PT Short Term Goals PT Short Term Goal 1: Patient will be able to ambulate 69mintues without pain >3/10 to perform short grocery shopping trips PT Short Term Goal 2: Patient will be able to perform sit to stand transfer without using UEs PT Short Term Goal 3: Patient will have negative Ely test for quariceps flexibility indicating improved ability to flex knee so patient my sit lower surface as strength improves PT Short Term Goal 3 - Progress: Progressing toward goal PT Short Term Goal 4: Increase hip extension to 10 degrees to increase stride length (8 degrees) PT Short Term Goal 4 - Progress: Progressing toward goal PT Long Term Goals PT Long Term Goal 1: Patient will be able to ambulate 94mintues without pain >3/10 to perform short grocery shopping trips PT Long Term Goal 2: Patient will perform 5x sit to stand in <15seconds Long Term Goal 3: Patient's hip extension/abduction to impove to 4/5 with manual muscle test to improve ability to single leg stand >3 seconds Long Term Goal 3 Progress: Progressing toward goal  Problem List Patient Active Problem List   Diagnosis Date Noted  . Difficulty walking 10/20/2013  . Parkinson disease 08/24/2013  . Hypothyroid 05/05/2013  . Hypertension 05/05/2013    PT - End of Session Activity Tolerance: Patient tolerated treatment well General Behavior During Therapy: [facility] for tasks assessed/performed  GP    Aldona Lento 12/01/2013, 11:00 AM

## 2013-12-03 ENCOUNTER — Ambulatory Visit (HOSPITAL_COMMUNITY)
Admission: RE | Admit: 2013-12-03 | Discharge: 2013-12-03 | Disposition: A | Discharge status: Home / Self Care | Payer: Medicare Other | Source: Ambulatory Visit | Attending: Family Medicine | Admitting: Family Medicine

## 2013-12-03 NOTE — Progress Notes (Signed)
Physical Therapy Treatment Patient Details  Name: Lisa Torres MRN: [id number] Date of Birth: 01-Oct-1945  Today's Date: 12/03/2013 Time: 7353-2992 PT Time Calculation (min): 49 min Charge: TE 4268-3419  Visit#: 13 of 18  Re-eval: 12/21/13 Assessment Diagnosis: Difficulty walking secondary to decreased LE strength and stiffness.Marland Kitchen  Next MD Visit: Marin Olp Prior Therapy: no  Authorization: Medicare  Authorization Time Period:    Authorization Visit#: 13 of 20   Subjective: Symptoms/Limitations Symptoms: Pain free, pt stated that she is feeling stronger Pain Assessment Currently in Pain?: No/denies  Objective:   Exercise/Treatments Stretches Active Hamstring Stretch: Limitations;3 reps;20 seconds Active Hamstring Stretch Limitations: on 14in  box Gastroc Stretch: 3 reps;30 seconds Gastroc Stretch Limitations: slant board. Aerobic Stationary Bike: nustep hill level 3, resistance 4  x 10 minutes SPM goal 100. Standing Heel Raises: Limitations Heel Raises Limitations: heel and toe walking 2RT Forward Lunges: Both;15 reps;Limitations Forward Lunges Limitations: on 8in box Side Lunges: Both;15 reps Functional Squat: 20 reps Functional Squat Limitations: 3D hip excursion  Gait Training: Heel to toe pattern with cueing for knee extension Other Standing Knee Exercises: flamango Bil 5x 3 directions    Physical Therapy Assessment and Plan PT Assessment and Plan Clinical Impression Statement: Sesion focus on improving AROM for ankle dorsiflexion, plantar flexion strengthening, hip mobilty and strengthening exercises to improve gait mechanics.  Added flamango exercises to strengthen posterior tibalis and other deep plantarflexors for improne heel touch and toe off during gait.  Pt with improved form with squats this session, min tacttile cueing to improve form.  Multimodal cueing required to improve form with forward and side lunges, most difficult exercise complete  this session.  Pt limited by fatgiue at end of session, no reports of pain. PT Plan: Conitnue to progress hip and knee mobilitiy specifically stretching hamstrigns, hip flexors, and quadraceps muscles). and progress strength as pain improves with focus on improvign glut strength anddepth of loading.     Goals PT Short Term Goals PT Short Term Goal 1: Patient will be able to ambulate 48mintues without pain >3/10 to perform short grocery shopping trips PT Short Term Goal 2: Patient will be able to perform sit to stand transfer without using UEs PT Short Term Goal 3: Patient will have negative Ely test for quariceps flexibility indicating improved ability to flex knee so patient my sit lower surface as strength improves PT Short Term Goal 3 - Progress: Progressing toward goal PT Short Term Goal 4: Increase hip extension to 10 degrees to increase stride length (8 degrees) PT Short Term Goal 4 - Progress: Progressing toward goal PT Long Term Goals PT Long Term Goal 1: Patient will be able to ambulate 30mintues without pain >3/10 to perform short grocery shopping trips PT Long Term Goal 1 - Progress: Progressing toward goal PT Long Term Goal 2: Patient will perform 5x sit to stand in <15seconds Long Term Goal 3: Patient's hip extension/abduction to impove to 4/5 with manual muscle test to improve ability to single leg stand >3 seconds Long Term Goal 3 Progress: Progressing toward goal  Problem List Patient Active Problem List   Diagnosis Date Noted  . Difficulty walking 10/20/2013  . Parkinson disease 08/24/2013  . Hypothyroid 05/05/2013  . Hypertension 05/05/2013    PT - End of Session Activity Tolerance: Patient tolerated treatment well General Behavior During Therapy: [facility] for tasks assessed/performed  GP    Aldona Lento 12/03/2013, 12:01 PM

## 2013-12-08 ENCOUNTER — Ambulatory Visit (HOSPITAL_COMMUNITY)
Admission: RE | Admit: 2013-12-08 | Discharge: 2013-12-08 | Disposition: A | Discharge status: Home / Self Care | Payer: Medicare Other | Source: Ambulatory Visit | Attending: Family Medicine | Admitting: Family Medicine

## 2013-12-08 NOTE — Progress Notes (Signed)
Physical Therapy Treatment Patient Details  Name: Lisa Torres MRN: [id number] Date of Birth: Oct 25, 1945  Today's Date: 12/08/2013 Time: 2952-8413 PT Time Calculation (min): 50 min   Charges: TherEx [id number] Visit#: 14 of 18  Re-eval: 12/21/13 Assessment Diagnosis: Difficulty walking secondary to decreased LE strength and stiffness.Marland Kitchen  Next MD Visit: Marin Olp Prior Therapy: no  Authorization: Medicare  Authorization Visit#:  14 of   18  Subjective: Symptoms/Limitations Symptoms: Pain free, pt stated that she is feeling stronger. Patient states she feels like she is getting a cold but notes that her walkign feels much better.   Exercise/Treatments Stretches Active Hamstring Stretch: Limitations;3 reps;20 seconds Active Hamstring Stretch Limitations: on 14in  box Hip Flexor Stretch Limitations: 10x 3 seconds 2 sets with therapist assist for knee extension Gastroc Stretch: 3 reps;30 seconds Gastroc Stretch Limitations: slant board. Aerobic Stationary Bike: nustep hill level 3, resistance 4  x 10 minutes SPM goal 100. Standing Functional Squat Limitations: 3D hip excursion 10x, split stang squat10x Other Standing Knee Exercises: 2D walking hip excursions 1 lap each, 2 way big reach walk 2 laps each, walking lunges 1 lap,   Physical Therapy Assessment and Plan PT Assessment and Plan Clinical Impression Statement: Session focused on improvign gait with focus on improvign knee extesnsion and weight shifting during gait as patient initially demosntrated lack of terminal knee extension throughout gait, ealy heel rise at heel off and widened gait secondary to limited balance and LE weakness. Following lunging to an 8" box with functional manual reaction techiniques to improve hip and knee extesnion patient ambulated with significantly improed stride length. Patient then performed lunge walks with therapis assist for balance and cues to increase stride legth follwoign which  patient demosntrated further improvements in stride length.  Recommend continue activites next session to contine imroving gait and dynamic balance  PT Plan: Conitnue to progress hip and knee mobilitiy specifically stretching hamstrigns, hip flexors, and quadraceps muscles). and progress hip strengthe to improve stability during gait and with stairs.     Goals PT Short Term Goals PT Short Term Goal 3: Patient will have negative Ely test for quariceps flexibility indicating improved ability to flex knee so patient my sit lower surface as strength improves PT Short Term Goal 3 - Progress: Progressing toward goal PT Short Term Goal 4: Increase hip extension to 10 degrees to increase stride length PT Short Term Goal 4 - Progress: Progressing toward goal PT Long Term Goals PT Long Term Goal 1: Patient will be able to ambulate 27mintues without pain >3/10 to perform short grocery shopping trips PT Long Term Goal 1 - Progress: Progressing toward goal Long Term Goal 3: Patient's hip extension/abduction to impove to 4/5 with manual muscle test to improve ability to single leg stand >3 seconds Long Term Goal 3 Progress: Progressing toward goal  Problem List Patient Active Problem List   Diagnosis Date Noted  . Difficulty walking 10/20/2013  . Parkinson disease 08/24/2013  . Hypothyroid 05/05/2013  . Hypertension 05/05/2013    PT - End of Session Activity Tolerance: Patient tolerated treatment well General Behavior During Therapy: [facility] for tasks assessed/performed  GP    Mairen Wallenstein R 12/08/2013, 12:01 PM

## 2013-12-10 ENCOUNTER — Ambulatory Visit (HOSPITAL_COMMUNITY)
Admission: RE | Admit: 2013-12-10 | Discharge: 2013-12-10 | Disposition: A | Discharge status: Home / Self Care | Payer: Medicare Other | Source: Ambulatory Visit | Attending: Family Medicine | Admitting: Family Medicine

## 2013-12-10 NOTE — Progress Notes (Signed)
Physical Therapy Treatment Patient Details  Name: Lisa Torres MRN: [id number] Date of Birth: 1946/04/15  Today's Date: 12/10/2013 Time: 7106-2694 PT Time Calculation (min): 55 min      Charges: therEx 1045-1130, manual 1130-1140 Visit#: 15 of 18  Re-eval: 12/21/13  Authorization: Medicare  Authorization Time Period:    Authorization Visit#: 15 of 20   Subjective: Symptoms/Limitations Symptoms: Pain free primary complain of weakness.   Exercise/Treatments Stretches Active Hamstring Stretch: Limitations;3 reps;20 seconds Active Hamstring Stretch Limitations: on 14in  box Hip Flexor Stretch Limitations: 10x 3 seconds 2 sets with therapist assist for knee extension Gastroc Stretch: 3 reps;30 seconds Gastroc Stretch Limitations: slant board. Aerobic Stationary Bike: nustep intervals level 4, 8 minutes SPM goal 100. Standing Forward Lunges: Both;Limitations;10 reps Forward Lunges Limitations: on 8in box and floor Side Lunges: Both;Limitations;10 reps Side Lunges Limitations: on 8in box and floor Functional Squat Limitations: Split stance 3D hip excursion 10x,  Other Standing Knee Exercises: 2D walking hip excursions 1 lap each, 2 way big reach walk 2 laps each, walking lunges 1 lap,   Manual Therapy Manual Therapy: Other (comment) Joint Mobilization: Functional manual reaction techiniques during lunging to improve Rt knee extenison Other Manual Therapy: manual quad stretch  Physical Therapy Assessment and Plan PT Assessment and Plan Clinical Impression Statement: Session focused on further improving gait with focus on improving knee/hip extension and weight shifting during gait as patient initially demonstrated lack of terminal knee extension throughout gait, early heel rise at heel off and widened gait secondary to limited balance and LE weakness, though these were improved since last session. Following lunging to an 8" box with functional manual reaction techniques to  improve hip and knee extension patient ambulated with significantly improved stride length, lunging was progressed to floor with patient continuing to note no pain . Patient then performed lunge walks with therapist assist for balance and cues to increase stride length following which patient demonstrated further improvements in stride length. Recommend continue activities next session to continue improving gait and dynamic balance. Patient requires one rest break secondary to a coughing fit the patient blamed on a cold.  PT Plan: Conitnue to progress hip and knee mobilitiy specifically stretching hamstrigns, hip flexors, and quadraceps muscles). Begin stair training next session.   Continue Ely stretch next session  Goals PT Short Term Goals PT Short Term Goal 1: Patient will be able to ambulate 45mntues without pain >3/10 to perform short grocery shopping trips PT Short Term Goal 2: Patient will be able to perform sit to stand transfer without using UEs PT Short Term Goal 2 - Progress: Met PT Short Term Goal 3: Patient will have negative Ely test for quariceps flexibility indicating improved ability to flex knee so patient my sit lower surface as strength improves PT Short Term Goal 3 - Progress: Progressing toward goal PT Short Term Goal 4: Increase hip extension to 10 degrees to increase stride length PT Short Term Goal 4 - Progress: Met PT Long Term Goals PT Long Term Goal 1: Patient will be able to ambulate 630mtues without pain >3/10 to perform short grocery shopping trips PT Long Term Goal 1 - Progress: Progressing toward goal PT Long Term Goal 2: Patient will perform 5x sit to stand in <15seconds PT Long Term Goal 2 - Progress: Met Long Term Goal 3: Patient's hip extension/abduction to impove to 4/5 with manual muscle test to improve ability to single leg stand >3 seconds Long Term Goal 3 Progress: Progressing toward goal  Problem List Patient Active Problem List   Diagnosis Date  Noted  . Difficulty walking 10/20/2013  . Parkinson disease 08/24/2013  . Hypothyroid 05/05/2013  . Hypertension 05/05/2013    General Behavior During Therapy: [facility] for tasks assessed/performed  GP    DeWitt, Cash R 12/10/2013, 11:40 AM

## 2013-12-14 ENCOUNTER — Ambulatory Visit (HOSPITAL_COMMUNITY)
Admission: RE | Admit: 2013-12-14 | Discharge: 2013-12-14 | Disposition: A | Discharge status: Home / Self Care | Payer: Medicare Other | Source: Ambulatory Visit | Attending: Family Medicine | Admitting: Family Medicine

## 2013-12-14 NOTE — Progress Notes (Signed)
Physical Therapy Re-evaluation/Treatment Note/Discharge summary  Patient Details  Name: Lisa Torres MRN: [id number] Date of Birth: [birth year]  Today's Date: 12/14/2013 Time: 9937-1696 PT Time Calculation (min): 49 min Charge: MMT/ROM Measurement (no charge for ROM) 1056-1120, TE 1120-1145               Visit#: 16 of 18  Re-eval: 12/21/13 Assessment Diagnosis: Difficulty walking secondary to decreased LE strength and stiffness.Marland Kitchen  Next MD Visit: Marin Olp Prior Therapy: no  Authorization: Medicare    Authorization Time Period:    Authorization Visit#: 16 of 20   Subjective Symptoms/Limitations Symptoms: Pt stated pain free today, main problem with allergies today.  Still has difficutly with sit to stands though does feel stronger.  Compliant with HEP and feels she is ready to begin doing this at home. How long can you sit comfortably?: No difficulty, able to sit through meeting without c/o How long can you stand comfortably?: able to stand for an hour (was 30 minutes). How long can you walk comfortably?: Able to walk for 1 hour (was 30 minutes) Pain Assessment Currently in Pain?: No/denies  Objective:   Sensation/Coordination/Flexibility/Functional Tests Functional Tests Functional Tests: FOTO 84% (71%)  Assessment RLE AROM (degrees) Right Hip Extension:  (was 8) Right Hip Flexion: 103 (was 90) Right Knee Extension: -7 (was -11) Right Knee Flexion: 130 Right Ankle Dorsiflexion: 18 (was 10) RLE Strength Right Hip Flexion:  (4+/5 was 3+/5) Right Hip Extension: 4/5 (was 3/5) Right Hip External Rotation : 25 (was 20) Right Hip Internal Rotation : 45 (was 35) Right Hip ABduction: 5/5 (was 4/5) Right Hip ADduction: 5/5 Right Knee Flexion: 5/5 (was 4/5) Right Knee Extension: 5/5 (was 4/5) LLE AROM (degrees) Left Hip Extension:  (was 6) Left Hip Flexion: 100 (was 90) Left Hip External Rotation : 30 (was 15) Left Hip Internal Rotation : 35 (was 30) Left  Knee Extension: -3 (was -3) Left Knee Flexion: 133 LLE Strength Left Hip Flexion:  (4+/5 was 3/5) Left Hip Extension: 4/5 (was 3+/5) Left Hip ABduction: 5/5 (was 4/5) Left Hip ADduction: 5/5 Left Knee Flexion: 5/5 (was 4/5) Left Knee Extension: 5/5 (was 4/5)  Exercise/Treatments Aerobic Stationary Bike: nustep intervals level 4, 10 minutes SPM goal 100. Standing Forward Lunges: Both;5 reps Side Lunges: Both;5 reps Lateral Step Up: Both;10 reps;Hand Hold: 1;Step Height: 6" Forward Step Up: Both;10 reps;Hand Hold: 1;Step Height: 6" Step Down: Both;10 reps;Hand Hold: 1;Step Height: 6" Functional Squat: 10 reps;Limitations Functional Squat Limitations: 3D hip excursion Stairs: 1RT reciprocal pattern 1 HR     Physical Therapy Assessment and Plan PT Assessment and Plan Clinical Impression Statement: Reassessment complete following report from pt that she felt ready for d/c with the following findings:  Pt compliant with HEP 1-2x daily and able to demonstrate appropriate technique with all exercises and stretches.  Pt with improved mobilty in hip, knee and ankle with  improved  gait mechanics and no reports of recent falls.  Improved strengthen for all LE musculature and functional strengthening with abiltiy to ambulate reciprocal pattern ascending and descending stairs.  Improved perceived functional abilities with FOTO score of 84%.  Pt educated on benefits of continuing exercises, pt with copies of her HEP exercises and [employer] 2 week membership. PT Plan: D/C to HEP.      Goals Home Exercise Program PT Goal: Perform Home Exercise Program - Progress: Met PT Short Term Goals PT Short Term Goal 1: Patient will be able to ambulate 6mntues without pain >  3/10 to perform short grocery shopping trips PT Short Term Goal 1 - Progress: Met PT Short Term Goal 2: Patient will be able to perform sit to stand transfer without using UEs PT Short Term Goal 2 - Progress: Met PT Short Term Goal 3 -  Progress: Progressing toward goal (complaint with quad sets daily) PT Short Term Goal 4: Increase hip extension to 10 degrees to increase stride length PT Short Term Goal 4 - Progress: Met PT Long Term Goals PT Long Term Goal 1: Patient will be able to ambulate 43mntues without pain >3/10 to perform short grocery shopping trips PT Long Term Goal 1 - Progress: Met PT Long Term Goal 2: Patient will perform 5x sit to stand in <15seconds PT Long Term Goal 2 - Progress: Met Long Term Goal 3: Patient's hip extension/abduction to impove to 4/5 with manual muscle test to improve ability to single leg stand >3 seconds Long Term Goal 3 Progress: Met  Problem List Patient Active Problem List   Diagnosis Date Noted  . Difficulty walking 10/20/2013  . Parkinson disease 08/24/2013  . Hypothyroid 05/05/2013  . Hypertension 05/05/2013    PT - End of Session Activity Tolerance: Patient tolerated treatment well General Behavior During Therapy: WFL for tasks assessed/performed  GP Functional Assessment Tool Used: FOTO Status 84%, limitation 16%  (was 71% status, 29% limitation) Functional Limitation: Mobility: Walking and moving around Mobility: Walking and Moving Around Goal Status [phone]: At least 20 percent but less than 40 percent impaired, limited or restricted Mobility: Walking and Moving Around Discharge Status [phone]: At least 1 percent but less than 20 percent impaired, limited or restricted  CAldona Lento6/30/2015, 12:00 PM  Physician Documentation Your signature is required to indicate approval of the treatment plan as stated above.  Please sign and either send electronically or make a copy of this report for your files and return this physician signed original.   Please mark one 1.__approve of plan  2. ___approve of plan with the following conditions.   ______________________________                                                          _____________________ Physician  Signature                                                                                                             Date

## 2013-12-21 DIAGNOSIS — N3000 Acute cystitis without hematuria: Secondary | ICD-10-CM | POA: Diagnosis not present

## 2013-12-21 DIAGNOSIS — I10 Essential (primary) hypertension: Secondary | ICD-10-CM | POA: Diagnosis not present

## 2013-12-21 DIAGNOSIS — E039 Hypothyroidism, unspecified: Secondary | ICD-10-CM | POA: Diagnosis not present

## 2013-12-22 NOTE — Progress Notes (Signed)
Physical Therapy Re-evaluation/Treatment Note/Discharge summary  Patient Details  Name: Lisa Torres MRN: [id number] Date of Birth: [birth year]  Today's Date: 12/14/2013 Time: 9937-1696 PT Time Calculation (min): 49 min Charge: MMT/ROM Measurement (no charge for ROM) 1056-1120, TE 1120-1145               Visit#: 16 of 18  Re-eval: 12/21/13 Assessment Diagnosis: Difficulty walking secondary to decreased LE strength and stiffness.Marland Kitchen  Next MD Visit: Marin Olp Prior Therapy: no  Authorization: Medicare    Authorization Time Period:    Authorization Visit#: 16 of 20   Subjective Symptoms/Limitations Symptoms: Pt stated pain free today, main problem with allergies today.  Still has difficutly with sit to stands though does feel stronger.  Compliant with HEP and feels she is ready to begin doing this at home. How long can you sit comfortably?: No difficulty, able to sit through meeting without c/o How long can you stand comfortably?: able to stand for an hour (was 30 minutes). How long can you walk comfortably?: Able to walk for 1 hour (was 30 minutes) Pain Assessment Currently in Pain?: No/denies  Objective:   Sensation/Coordination/Flexibility/Functional Tests Functional Tests Functional Tests: FOTO 84% (71%)  Assessment RLE AROM (degrees) Right Hip Extension:  (was 8) Right Hip Flexion: 103 (was 90) Right Knee Extension: -7 (was -11) Right Knee Flexion: 130 Right Ankle Dorsiflexion: 18 (was 10) RLE Strength Right Hip Flexion:  (4+/5 was 3+/5) Right Hip Extension: 4/5 (was 3/5) Right Hip External Rotation : 25 (was 20) Right Hip Internal Rotation : 45 (was 35) Right Hip ABduction: 5/5 (was 4/5) Right Hip ADduction: 5/5 Right Knee Flexion: 5/5 (was 4/5) Right Knee Extension: 5/5 (was 4/5) LLE AROM (degrees) Left Hip Extension:  (was 6) Left Hip Flexion: 100 (was 90) Left Hip External Rotation : 30 (was 15) Left Hip Internal Rotation : 35 (was 30) Left  Knee Extension: -3 (was -3) Left Knee Flexion: 133 LLE Strength Left Hip Flexion:  (4+/5 was 3/5) Left Hip Extension: 4/5 (was 3+/5) Left Hip ABduction: 5/5 (was 4/5) Left Hip ADduction: 5/5 Left Knee Flexion: 5/5 (was 4/5) Left Knee Extension: 5/5 (was 4/5)  Exercise/Treatments Aerobic Stationary Bike: nustep intervals level 4, 10 minutes SPM goal 100. Standing Forward Lunges: Both;5 reps Side Lunges: Both;5 reps Lateral Step Up: Both;10 reps;Hand Hold: 1;Step Height: 6" Forward Step Up: Both;10 reps;Hand Hold: 1;Step Height: 6" Step Down: Both;10 reps;Hand Hold: 1;Step Height: 6" Functional Squat: 10 reps;Limitations Functional Squat Limitations: 3D hip excursion Stairs: 1RT reciprocal pattern 1 HR     Physical Therapy Assessment and Plan PT Assessment and Plan Clinical Impression Statement: Reassessment complete following report from pt that she felt ready for d/c with the following findings:  Pt compliant with HEP 1-2x daily and able to demonstrate appropriate technique with all exercises and stretches.  Pt with improved mobilty in hip, knee and ankle with  improved  gait mechanics and no reports of recent falls.  Improved strengthen for all LE musculature and functional strengthening with abiltiy to ambulate reciprocal pattern ascending and descending stairs.  Improved perceived functional abilities with FOTO score of 84%.  Pt educated on benefits of continuing exercises, pt with copies of her HEP exercises and [employer] 2 week membership. PT Plan: D/C to HEP.      Goals Home Exercise Program PT Goal: Perform Home Exercise Program - Progress: Met PT Short Term Goals PT Short Term Goal 1: Patient will be able to ambulate 6mntues without pain >  3/10 to perform short grocery shopping trips PT Short Term Goal 1 - Progress: Met PT Short Term Goal 2: Patient will be able to perform sit to stand transfer without using UEs PT Short Term Goal 2 - Progress: Met PT Short Term Goal 3 -  Progress: Progressing toward goal (complaint with quad sets daily) PT Short Term Goal 4: Increase hip extension to 10 degrees to increase stride length PT Short Term Goal 4 - Progress: Met PT Long Term Goals PT Long Term Goal 1: Patient will be able to ambulate 20mntues without pain >3/10 to perform short grocery shopping trips PT Long Term Goal 1 - Progress: Met PT Long Term Goal 2: Patient will perform 5x sit to stand in <15seconds PT Long Term Goal 2 - Progress: Met Long Term Goal 3: Patient's hip extension/abduction to impove to 4/5 with manual muscle test to improve ability to single leg stand >3 seconds Long Term Goal 3 Progress: Met  Problem List Patient Active Problem List   Diagnosis Date Noted  . Difficulty walking 10/20/2013  . Parkinson disease 08/24/2013  . Hypothyroid 05/05/2013  . Hypertension 05/05/2013    PT - End of Session Activity Tolerance: Patient tolerated treatment well General Behavior During Therapy: WFL for tasks assessed/performed  GP Functional Assessment Tool Used: FOTO Status 84%, limitation 16%  (was 71% status, 29% limitation) Functional Limitation: Mobility: Walking and moving around Mobility: Walking and Moving Around Goal Status [phone]: At least 20 percent but less than 40 percent impaired, limited or restricted Mobility: Walking and Moving Around Discharge Status [phone]: At least 1 percent but less than 20 percent impaired, limited or restricted  CAldona Lento6/30/2015, 12:00 PM  CDevona KonigPT DPT 12/22/13 9:00AM  Physician Documentation Your signature is required to indicate approval of the treatment plan as stated above.  Please sign and either send electronically or make a copy of this report for your files and return this physician signed original.   Please mark one 1.__approve of plan  2. ___approve of plan with the following conditions.   ______________________________                                                           _____________________ Physician Signature                                                                                                             Date

## 2013-12-28 DIAGNOSIS — G250 Essential tremor: Secondary | ICD-10-CM | POA: Diagnosis not present

## 2013-12-28 DIAGNOSIS — F411 Generalized anxiety disorder: Secondary | ICD-10-CM | POA: Diagnosis not present

## 2013-12-28 DIAGNOSIS — K2100 Gastro-esophageal reflux disease with esophagitis, without bleeding: Secondary | ICD-10-CM | POA: Diagnosis not present

## 2013-12-28 DIAGNOSIS — M1990 Unspecified osteoarthritis, unspecified site: Secondary | ICD-10-CM | POA: Diagnosis not present

## 2013-12-28 DIAGNOSIS — I10 Essential (primary) hypertension: Secondary | ICD-10-CM | POA: Diagnosis not present

## 2013-12-28 DIAGNOSIS — M5430 Sciatica, unspecified side: Secondary | ICD-10-CM | POA: Diagnosis not present

## 2013-12-28 DIAGNOSIS — E039 Hypothyroidism, unspecified: Secondary | ICD-10-CM | POA: Diagnosis not present

## 2013-12-31 ENCOUNTER — Telehealth: Payer: Self-pay | Admitting: *Deleted

## 2013-12-31 NOTE — Telephone Encounter (Signed)
Called patient to r/s appointment, patient doesn't have an answering machine will try again later.

## 2014-01-04 DIAGNOSIS — Z853 Personal history of malignant neoplasm of breast: Secondary | ICD-10-CM | POA: Diagnosis not present

## 2014-01-04 DIAGNOSIS — R599 Enlarged lymph nodes, unspecified: Secondary | ICD-10-CM | POA: Diagnosis not present

## 2014-01-05 NOTE — Telephone Encounter (Signed)
Called patient again to r/s appointment, left message with spouse to call back and r/s her appointment.

## 2014-01-06 NOTE — Telephone Encounter (Signed)
Patient returned call and r/s appointment with NP CM to 03/31/14 at 9:30 am.

## 2014-01-07 ENCOUNTER — Ambulatory Visit: Payer: Medicare Other | Admitting: Nurse Practitioner

## 2014-01-11 ENCOUNTER — Ambulatory Visit (HOSPITAL_COMMUNITY)
Admission: RE | Admit: 2014-01-11 | Discharge: 2014-01-11 | Disposition: A | Discharge status: Home / Self Care | Payer: Medicare Other | Source: Ambulatory Visit | Attending: Hematology and Oncology | Admitting: Hematology and Oncology

## 2014-01-11 DIAGNOSIS — R928 Other abnormal and inconclusive findings on diagnostic imaging of breast: Secondary | ICD-10-CM | POA: Diagnosis not present

## 2014-01-11 DIAGNOSIS — C50919 Malignant neoplasm of unspecified site of unspecified female breast: Secondary | ICD-10-CM | POA: Diagnosis not present

## 2014-01-11 DIAGNOSIS — E039 Hypothyroidism, unspecified: Secondary | ICD-10-CM | POA: Insufficient documentation

## 2014-01-11 DIAGNOSIS — I10 Essential (primary) hypertension: Secondary | ICD-10-CM | POA: Diagnosis not present

## 2014-01-11 DIAGNOSIS — Z853 Personal history of malignant neoplasm of breast: Secondary | ICD-10-CM | POA: Diagnosis not present

## 2014-01-18 ENCOUNTER — Encounter (HOSPITAL_COMMUNITY): Payer: Medicare Other

## 2014-01-18 DIAGNOSIS — R599 Enlarged lymph nodes, unspecified: Secondary | ICD-10-CM | POA: Diagnosis not present

## 2014-01-19 DIAGNOSIS — R599 Enlarged lymph nodes, unspecified: Secondary | ICD-10-CM | POA: Diagnosis not present

## 2014-01-19 DIAGNOSIS — E0789 Other specified disorders of thyroid: Secondary | ICD-10-CM | POA: Diagnosis not present

## 2014-01-19 DIAGNOSIS — K219 Gastro-esophageal reflux disease without esophagitis: Secondary | ICD-10-CM | POA: Diagnosis not present

## 2014-01-19 DIAGNOSIS — Z853 Personal history of malignant neoplasm of breast: Secondary | ICD-10-CM | POA: Diagnosis not present

## 2014-01-19 DIAGNOSIS — I10 Essential (primary) hypertension: Secondary | ICD-10-CM | POA: Diagnosis not present

## 2014-01-19 DIAGNOSIS — Z79899 Other long term (current) drug therapy: Secondary | ICD-10-CM | POA: Diagnosis not present

## 2014-01-20 DIAGNOSIS — E0789 Other specified disorders of thyroid: Secondary | ICD-10-CM | POA: Diagnosis not present

## 2014-01-20 DIAGNOSIS — C50919 Malignant neoplasm of unspecified site of unspecified female breast: Secondary | ICD-10-CM | POA: Diagnosis not present

## 2014-01-20 DIAGNOSIS — R238 Other skin changes: Secondary | ICD-10-CM | POA: Diagnosis not present

## 2014-01-20 DIAGNOSIS — D360 Benign neoplasm of lymph nodes: Secondary | ICD-10-CM | POA: Diagnosis not present

## 2014-01-20 DIAGNOSIS — Z79899 Other long term (current) drug therapy: Secondary | ICD-10-CM | POA: Diagnosis not present

## 2014-01-20 DIAGNOSIS — K219 Gastro-esophageal reflux disease without esophagitis: Secondary | ICD-10-CM | POA: Diagnosis not present

## 2014-01-20 DIAGNOSIS — I10 Essential (primary) hypertension: Secondary | ICD-10-CM | POA: Diagnosis not present

## 2014-01-20 DIAGNOSIS — Z853 Personal history of malignant neoplasm of breast: Secondary | ICD-10-CM | POA: Diagnosis not present

## 2014-01-20 DIAGNOSIS — R599 Enlarged lymph nodes, unspecified: Secondary | ICD-10-CM | POA: Diagnosis not present

## 2014-01-20 NOTE — Telephone Encounter (Signed)
Noted  

## 2014-02-18 ENCOUNTER — Telehealth (HOSPITAL_COMMUNITY): Payer: Self-pay | Admitting: *Deleted

## 2014-02-18 ENCOUNTER — Other Ambulatory Visit (HOSPITAL_COMMUNITY): Payer: Self-pay | Admitting: Hematology and Oncology

## 2014-02-18 MED ORDER — ANASTROZOLE 1 MG PO TABS: 1.0000 mg | ORAL_TABLET | Freq: Every morning | ORAL | Status: DC

## 2014-03-02 DIAGNOSIS — H31019 Macula scars of posterior pole (postinflammatory) (post-traumatic), unspecified eye: Secondary | ICD-10-CM | POA: Diagnosis not present

## 2014-03-14 ENCOUNTER — Telehealth: Payer: Self-pay | Admitting: Neurology

## 2014-03-14 ENCOUNTER — Other Ambulatory Visit (HOSPITAL_COMMUNITY): Payer: Self-pay | Admitting: Hematology and Oncology

## 2014-03-14 MED ORDER — PRAMIPEXOLE DIHYDROCHLORIDE 1 MG PO TABS: 1.5000 mg | ORAL_TABLET | Freq: Three times a day (TID) | ORAL | Status: DC

## 2014-03-14 MED ORDER — ANASTROZOLE 1 MG PO TABS: 1.0000 mg | ORAL_TABLET | Freq: Every day | ORAL | Status: DC

## 2014-03-14 NOTE — Telephone Encounter (Signed)
Patient calling for refills of Arimidex and Mirapex. She uses [facility]. They told her that they needed authorization before filling Rx. Best call back number [phone].

## 2014-03-14 NOTE — Telephone Encounter (Signed)
I called the patient back.  She would like the Mirapex sent to mail order and will ask the Dr who prescribed Arimidex to write that Rx.

## 2014-03-31 ENCOUNTER — Encounter: Payer: Self-pay | Admitting: Adult Health

## 2014-03-31 ENCOUNTER — Ambulatory Visit (INDEPENDENT_AMBULATORY_CARE_PROVIDER_SITE_OTHER): Payer: Medicare Other | Admitting: Adult Health

## 2014-03-31 ENCOUNTER — Encounter (INDEPENDENT_AMBULATORY_CARE_PROVIDER_SITE_OTHER): Payer: Self-pay

## 2014-03-31 VITALS — BP 151/74 | HR 63 | Ht 62.0 in | Wt 154.4 lb

## 2014-03-31 DIAGNOSIS — G20 Parkinson's disease: Secondary | ICD-10-CM | POA: Diagnosis not present

## 2014-03-31 NOTE — Patient Instructions (Signed)
Parkinson Disease Parkinson disease is a disorder of the central nervous system, which includes the brain and spinal cord. A person with this disease slowly loses the ability to completely control body movements. Within the brain, there is a group of nerve cells (basal ganglia) that help control movement. The basal ganglia are damaged and do not work properly in a person with Parkinson disease. In addition, the basal ganglia produce and use a brain chemical called dopamine. The dopamine chemical sends messages to other parts of the body to control and coordinate body movements. Dopamine levels are low in a person with Parkinson disease. If the dopamine levels are low, then the body does not receive the correct messages it needs to move normally.  CAUSES  The exact reason why the basal ganglia get damaged is not known. Some medical researchers have thought that infection, genes, environment, and certain medicines may contribute to the cause.  SYMPTOMS   An early symptom of Parkinson disease is often an uncontrolled shaking (tremor) of the hands. The tremor will often disappear when the affected hand is consciously used.  As the disease progresses, walking, talking, getting out of a chair, and new movements become more difficult.  Muscles get stiff and movements become slower.  Balance and coordination become harder.  Depression, trouble swallowing, urinary problems, constipation, and sleep problems can occur.  Later in the disease, memory and thought processes may deteriorate. DIAGNOSIS  There are no specific tests to diagnose Parkinson disease. You may be referred to a neurologist for evaluation. Your caregiver will ask about your medical history, symptoms, and perform a physical exam. Blood tests and imaging tests of your brain may be performed to rule out other diseases. The imaging tests may include an MRI or a CT scan. TREATMENT  The goal of treatment is to relieve symptoms. Medicines may be  prescribed once the symptoms become troublesome. Medicine will not stop the progression of the disease, but medicine can make movement and balance better and help control tremors. Speech and occupational therapy may also be prescribed. Sometimes, surgical treatment of the brain can be done in young people. HOME CARE INSTRUCTIONS  Get regular exercise and rest periods during the day to help prevent exhaustion and depression.  If getting dressed becomes difficult, replace buttons and zippers with Velcro and elastic on your clothing.  Take all medicine as directed by your caregiver.  Install grab bars or railings in your home to prevent falls.  Go to speech or occupational therapy as directed.  Keep all follow-up visits as directed by your caregiver. SEEK MEDICAL CARE IF:  Your symptoms are not controlled with your medicine.  You fall.  You have trouble swallowing or choke on your food. MAKE SURE YOU:  Understand these instructions.  Will watch your condition.  Will get help right away if you are not doing well or get worse. Document Released: 05/31/2000 Document Revised: 09/28/2012 Document Reviewed: 07/03/2011 Exit[employer] Patient Information 2015 [employer]. This information is not intended to replace advice given to you by your health care provider. Make sure you discuss any questions you have with your health care provider.  

## 2014-03-31 NOTE — Progress Notes (Signed)
PATIENT: Lisa Torres DOB: 1945/07/14  REASON FOR VISIT: follow up HISTORY FROM: patient  HISTORY OF PRESENT ILLNESS: Lisa Torres is a 68 year old female with a history of idiopathic parkinson's disease. She returns today for follow- up. She is currently taking Mirapex and Azilect and tolerating them well. She reports that her gait and balance has remained the same since the last visit. She has good days and bad days. she recently had physical therapy and feels that she got good benefit from that. She continues to participate in an  exercise class four times a week. Denies any recent falls. Patient will occasionally have a tremor in both hands but since starting medication that has improved. Denies any problems with  Swallowing. Denies any drooling. She states that she is sleeping well at night. She occasionally will talk in her sleep but denies acting out dreams.   HISTORY 09/30/13 (YY): Lisa Torres is a 68 years old right-handed female, accompanied by her husband and sister, referred by her primary care physician Dr. Consuello Masse for evaluation right hand tremor, change of her gait posture,.  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 Her gait has changes, become slower,.  She has no anosmia, when she gets up quickly, she has lightheadedness, she is taking ativan as needed for insomnia, she has excessive movement during her sleep.  She denies significant memory trouble, she was getting tapering dose of prednisone, 20 mg, 3 tablets 5 days followed by  2 tablets 5 days followed by 1 tablets for 5 days for unclear reasons  UPDATE April 16th 2015:  Her findings are consistent with idiopathic Parkinson's disease, She is taking mirapex 47m tid, it helped her right leg pain, gait difficulty, no significant side effect, we have review of MRI of the brain together, essentially normal, laboratory evaluation showed normal and negative B12, RPR, TSH, C-reactive protein, ESR, ANA, HIV  REVIEW OF SYSTEMS: Full 14 system review of systems performed and notable only for:  Constitutional: N/A  Eyes: N/A Ear/Nose/Throat: N/A  Skin: N/A  Cardiovascular: N/A  Respiratory: N/A  Gastrointestinal: N/A  Genitourinary: N/A Hematology/Lymphatic: N/A  Endocrine: N/A Musculoskeletal: Joint pain, muscle cramps  Allergy/Immunology: N/A  Neurological: N/A Psychiatric: N/A Sleep: Restless leg, sleep talking   ALLERGIES: Allergies  Allergen Reactions  . Hydrocodone   . Tramadol     Hallucinations, abd pain    HOME MEDICATIONS: Outpatient Prescriptions Prior to Visit  Medication Sig Dispense Refill  . acetaminophen (TYLENOL) 500 MG tablet Take 1,000 mg by mouth every 6 (six) hours as needed.      .Marland KitchenamLODipine (NORVASC) 5 MG tablet Take 5 mg by mouth every morning.       .Marland Kitchenanastrozole (ARIMIDEX) 1 MG tablet Take 1 tablet (1 mg total) by mouth every morning.  90 tablet  3  . calcium carbonate (OS-CAL) 600 MG TABS tablet Take 600 mg by mouth 2 (two) times daily with a meal.      . Cholecalciferol (VITAMIN D-3) 1000 UNITS CAPS Take 1,000 Units by mouth 2 (two) times daily.      .Marland Kitchen  cyclobenzaprine (FLEXERIL) 10 MG tablet Take 10 mg by mouth daily as needed. For muscle spasms      . levothyroxine (SYNTHROID, LEVOTHROID) 88 MCG tablet Take 88 mcg by mouth daily before breakfast.      . LORazepam (ATIVAN) 1 MG tablet Take 1 mg by mouth at bedtime as needed for sleep.       . naproxen sodium (ALEVE) 220 MG tablet Take 440 mg by mouth 2 (two) times daily as needed  (Pain).      Marland Kitchen omeprazole (PRILOSEC) 40 MG capsule Take 40 mg by mouth.       . polyethylene glycol (MIRALAX / GLYCOLAX) packet Take 17 g by mouth daily as needed for moderate constipation.       . pramipexole (MIRAPEX) 1 MG tablet Take 1.5 tablets (1.5 mg total) by mouth 3 (three) times daily.  405 tablet  3  . rasagiline (AZILECT) 1 MG TABS tablet Take 1 tablet (1 mg total) by mouth daily.  30 tablet  12  . anastrozole (ARIMIDEX) 1 MG tablet Take 1 tablet (1 mg total) by mouth daily.  90 tablet  3   No facility-administered medications prior to visit.    PAST MEDICAL HISTORY: Past Medical History  Diagnosis Date  . Breast cancer 11/2011  . Radiation 04/2012    33 treatments for breast cancer  . Hypertension   . Thyroid disease   . GERD (gastroesophageal reflux disease)     PAST SURGICAL HISTORY: Past Surgical History  Procedure Laterality Date  . Breast lumpectomy Left 12/2011  . Abdominal hysterectomy      partial  . Cholecystectomy    . Tubal ligation    . Colonoscopy N/A 07/01/2013    Procedure: COLONOSCOPY;  Surgeon: Rogene Houston, MD;  Location: [facility];  Service: Endoscopy;  Laterality: N/A;  59    FAMILY HISTORY: Family History  Problem Relation Age of Onset  . Cancer Mother 60    colon cancer  . Cancer Father 66    throat cancer    SOCIAL HISTORY: History   Social History  . Marital Status: Married    Spouse Name: Marcello Moores    Number of Children: 3  . Years of Education: 12 th   Occupational History  .      retired   Social History Main Topics  . Smoking status: Former Smoker -- 0.50 packs/day for 18 years    Types: Cigarettes    Quit date: 09/30/1998  . Smokeless tobacco: Never Used  . Alcohol Use: No  . Drug Use: No  . Sexual Activity: Not on file   Other Topics Concern  . Not on file   Social History Narrative   Patient lives at home with her husband Marcello Moores).    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   Right handed      PHYSICAL  EXAM  Filed Vitals:   03/31/14 1021  BP: 151/74  Pulse: 63  Height: 5' 2" (1.575 m)  Weight: 154 lb 6.4 oz (70.035 kg)   Body mass index is 28.23 kg/(m^2). Generalized: Well developed, in no acute distress   Neurological examination  Mentation: Alert oriented to time, place, history taking. Follows all commands speech and language fluent Cranial nerve II-XII: Pupils were equal round reactive to light. Extraocular movements were full, visual field were full on confrontational test. Facial sensation and strength were normal. Head turning and shoulder shrug  were normal and symmetric. Motor: The motor testing  reveals 5 over 5 strength of all 4 extremities. Good symmetric motor tone is noted throughout.  Sensory: Sensory testing is intact to soft touch on all 4 extremities. No evidence of extinction is noted.  Coordination: Cerebellar testing reveals good finger-nose-finger and heel-to-shin bilaterally.  Gait and station: patient Is able to stand from a sitting position with arms crossed. Gait is normal, decreased arm swing on the left, good turns. Tandem gait is normal. Romberg is negative. No drift is seen.  Reflexes: Deep tendon reflexes are symmetric and normal bilaterally.    DIAGNOSTIC DATA (LABS, IMAGING, TESTING) - I reviewed patient records, labs, notes, testing and imaging myself where available.  Lab Results  Component Value Date   WBC 2.7* 11/02/2013   HGB 12.5 11/02/2013   HCT 37.9 11/02/2013   MCV 85.9 11/02/2013   PLT 278 11/02/2013      Component Value Date/Time   NA 141 11/02/2013 1149   K 3.9 11/02/2013 1149   CL 100 11/02/2013 1149   CO2 28 11/02/2013 1149   GLUCOSE 81 11/02/2013 1149   BUN 8 11/02/2013 1149   CREATININE 0.81 11/02/2013 1149   CALCIUM 10.4 11/02/2013 1149   PROT 9.5* 11/02/2013 1149   ALBUMIN 4.8 11/02/2013 1149   AST 21 11/02/2013 1149   ALT 15 11/02/2013 1149   ALKPHOS 118* 11/02/2013 1149   BILITOT 0.6 11/02/2013 1149   GFRNONAA 73* 11/02/2013 1149    GFRAA 85* 11/02/2013 1149   Lab Results  Component Value Date   VITAMINB12 677 08/24/2013     ASSESSMENT AND PLAN 68 y.o. year old female  has a past medical history of Breast cancer (11/2011); Radiation (04/2012); Hypertension; Thyroid disease; and GERD (gastroesophageal reflux disease). here with:  1. Parkinson's Disease  Overall the patient has remained stable. She continues to take Mirapex and Azilect with good benefit. She recently just finished physical therapy and feels that she got good benefit from that as well. She has been participating in an exercise class four time a week.  I have encourage her to continue to participate. At this time patient's gait is stable. In the future she may benefit from Sinemet. Patient should follow-up in 6 months or sooner if needed.    Ward Givens, MSN, NP-C 03/31/2014, 10:37 AM [facility] [address], [place], [place] [address] [phone]  Note: This document was prepared with digital dictation and possible smart phrase technology. Any transcriptional errors that result from this process are unintentional.

## 2014-04-12 DIAGNOSIS — Z23 Encounter for immunization: Secondary | ICD-10-CM | POA: Diagnosis not present

## 2014-04-18 ENCOUNTER — Encounter: Payer: Self-pay | Admitting: Adult Health

## 2014-04-22 DIAGNOSIS — M5432 Sciatica, left side: Secondary | ICD-10-CM | POA: Diagnosis not present

## 2014-05-02 ENCOUNTER — Encounter (HOSPITAL_COMMUNITY): Payer: Medicare Other | Attending: Hematology and Oncology

## 2014-05-02 DIAGNOSIS — I10 Essential (primary) hypertension: Secondary | ICD-10-CM | POA: Insufficient documentation

## 2014-05-02 DIAGNOSIS — E039 Hypothyroidism, unspecified: Secondary | ICD-10-CM | POA: Diagnosis not present

## 2014-05-02 DIAGNOSIS — C50912 Malignant neoplasm of unspecified site of left female breast: Secondary | ICD-10-CM | POA: Diagnosis not present

## 2014-05-02 DIAGNOSIS — C50919 Malignant neoplasm of unspecified site of unspecified female breast: Secondary | ICD-10-CM | POA: Diagnosis not present

## 2014-05-02 LAB — COMPREHENSIVE METABOLIC PANEL
ALBUMIN: 4.2 g/dL (ref 3.5–5.2)
ALT: 17 U/L (ref 0–35)
ANION GAP: 9 (ref 5–15)
AST: 19 U/L (ref 0–37)
Alkaline Phosphatase: 108 U/L (ref 39–117)
BUN: 11 mg/dL (ref 6–23)
CO2: 29 mEq/L (ref 19–32)
CREATININE: 0.78 mg/dL (ref 0.50–1.10)
Calcium: 9.7 mg/dL (ref 8.4–10.5)
Chloride: 101 mEq/L (ref 96–112)
GFR calc Af Amer: >90 mL/min (ref >90)
GFR calc non Af Amer: 84 mL/min — ABNORMAL LOW (ref >90)
Glucose, Bld: 86 mg/dL (ref 70–99)
Potassium: 4.5 mEq/L (ref 3.7–5.3)
Sodium: 139 mEq/L (ref 137–147)
TOTAL PROTEIN: 8 g/dL (ref 6.0–8.3)
Total Bilirubin: 0.4 mg/dL (ref 0.3–1.2)

## 2014-05-02 LAB — CBC WITH DIFFERENTIAL/PLATELET
BASOS ABS: 0 10*3/uL (ref 0.0–0.1)
Basophils Relative: 1 % (ref 0–1)
EOS ABS: 0.2 10*3/uL (ref 0.0–0.7)
Eosinophils Relative: 6 % — ABNORMAL HIGH (ref 0–5)
HEMATOCRIT: 37.1 % (ref 36.0–46.0)
Hemoglobin: 12.1 g/dL (ref 12.0–15.0)
LYMPHS ABS: 1 10*3/uL (ref 0.7–4.0)
LYMPHS PCT: 38 % (ref 12–46)
MCH: 28.1 pg (ref 26.0–34.0)
MCHC: 32.6 g/dL (ref 30.0–36.0)
MCV: 86.3 fL (ref 78.0–100.0)
Monocytes Absolute: 0.1 10*3/uL (ref 0.1–1.0)
Monocytes Relative: 5 % (ref 3–12)
Neutro Abs: 1.4 10*3/uL — ABNORMAL LOW (ref 1.7–7.7)
Neutrophils Relative %: 50 % (ref 43–77)
PLATELETS: ADEQUATE 10*3/uL (ref 150–400)
RBC: 4.3 MIL/uL (ref 3.87–5.11)
RDW: 12.7 % (ref 11.5–15.5)
WBC: 2.7 10*3/uL — ABNORMAL LOW (ref 4.0–10.5)

## 2014-05-03 ENCOUNTER — Encounter (HOSPITAL_COMMUNITY): Payer: Self-pay

## 2014-05-03 ENCOUNTER — Encounter (HOSPITAL_BASED_OUTPATIENT_CLINIC_OR_DEPARTMENT_OTHER): Payer: Medicare Other

## 2014-05-03 VITALS — BP 151/72 | HR 63 | Temp 97.5°F | Resp 16 | Wt 153.0 lb

## 2014-05-03 DIAGNOSIS — E038 Other specified hypothyroidism: Secondary | ICD-10-CM | POA: Diagnosis not present

## 2014-05-03 DIAGNOSIS — C50912 Malignant neoplasm of unspecified site of left female breast: Secondary | ICD-10-CM | POA: Diagnosis not present

## 2014-05-03 LAB — CANCER ANTIGEN 27.29: CA 27.29: 30 U/mL (ref 0–39)

## 2014-05-03 LAB — CEA: CEA: 0.9 ng/mL (ref 0.0–5.0)

## 2014-05-03 NOTE — Patient Instructions (Signed)
[facility] Discharge Instructions  RECOMMENDATIONS MADE BY THE CONSULTANT AND ANY TEST RESULTS WILL BE SENT TO YOUR REFERRING PHYSICIAN.  EXAM FINDINGS BY THE PHYSICIAN TODAY AND SIGNS OR SYMPTOMS TO REPORT TO CLINIC OR PRIMARY PHYSICIAN: Exam and findings as discussed by Dr.Formanek.  MEDICATIONS PRESCRIBED:  DISCONTINUE use of CYCLOBENZAPRINE (Flexeril). It is contraindicated with the use of Rasagiline (Azilect).  INSTRUCTIONS/FOLLOW-UP: Return to clinic in 6 months for follow up. Report any issues/concerns to clinic as needed prior to appointments.  Thank you for choosing [facility] to provide your oncology and hematology care.  To afford each patient quality time with our providers, please arrive at least 15 minutes before your scheduled appointment time.  With your help, our goal is to use those 15 minutes to complete the necessary work-up to ensure our physicians have the information they need to help with your evaluation and healthcare recommendations.    Effective January 1st, 2014, we ask that you re-schedule your appointment with our physicians should you arrive 10 or more minutes late for your appointment.  We strive to give you quality time with our providers, and arriving late affects you and other patients whose appointments are after yours.    Again, thank you for choosing [facility].  Our hope is that these requests will decrease the amount of time that you wait before being seen by our physicians.       _____________________________________________________________  Should you have questions after your visit to [facility], please contact our office at [phone] between the hours of 8:30 a.m. and 4:30 p.m.  Voicemails left after 4:30 p.m. will not be returned until the following business day.  For prescription refill requests, have your pharmacy contact our office with your prescription refill request.     _______________________________________________________________  We hope that we have given you very good care.  You may receive a patient satisfaction survey in the mail, please complete it and return it as soon as possible.  We value your feedback!  _______________________________________________________________  Have you asked about our STAR program?  STAR stands for Survivorship Training and Rehabilitation, and this is a nationally recognized cancer care program that focuses on survivorship and rehabilitation.  Cancer and cancer treatments may cause problems, such as, pain, making you feel tired and keeping you from doing the things that you need or want to do. Cancer rehabilitation can help. Our goal is to reduce these troubling effects and help you have the best quality of life possible.  You may receive a survey from a nurse that asks questions about your current state of health.  Based on the survey results, all eligible patients will be referred to the [facility] program for an evaluation so we can better serve you!  A frequently asked questions sheet is available upon request.

## 2014-05-03 NOTE — Progress Notes (Signed)
[place]  OFFICE PROGRESS NOTE  [name], MD [place] [address]  DIAGNOSIS: Breast cancer, left  Other specified hypothyroidism  Chief Complaint  Patient presents with  . breast cancer left  . Parkinson's disease    CURRENT THERAPY: Anastrozole 1 mg daily since November 2013 plus calcium/vitamin D supplements.  INTERVAL HISTORY: Lisa Torres 68 y.o. female returns for followup of stage I left breast cancer while taking anastrozole 1 mg daily. She is ambulating better with less tremors while taking Azilect. She does have Flexeril at home but is not taking it. She denies a lymphedema, abnormalities on self breast examination, hot flashes, worsening joint discomfort. She did get an injection in the left hip for sciatica 2 weeks ago with improvement. Vaginal dryness has improved with the use of KY jelly. She has had no falls. She denies any PND, orthopnea, palpitations, nausea, vomiting, diarrhea, constipation, nocturia, incontinence, skin rash, headache, or seizures.  MEDICAL HISTORY: Past Medical History  Diagnosis Date  . Breast cancer 11/2011  . Radiation 04/2012    33 treatments for breast cancer  . Hypertension   . Thyroid disease   . GERD (gastroesophageal reflux disease)     INTERIM HISTORY: has Hypothyroid; Hypertension; Parkinson disease; and Difficulty walking on her problem list.   Left-sided breast cancer, diagnosed on 01/07/2012, infiltrating duct cell carcinoma, 0.7 cm in size with associated noninvasive ductal neoplasia, ER and PR positive, HER-2/neu not over amplified, treated postoperatively after lumpectomy and sentinel node biopsy with radiotherapy ending in November of 2013 followed by treatment with anastrozole along with calcium and vitamin D supplements.  ALLERGIES:  is allergic to hydrocodone and tramadol.  MEDICATIONS: has a current medication list which includes the following  prescription(s): acetaminophen, amlodipine, anastrozole, calcium carbonate, vitamin d-3, levothyroxine, lorazepam, naproxen sodium, omeprazole, polyethylene glycol, pramipexole, rasagiline, and cyclobenzaprine.  SURGICAL HISTORY:  Past Surgical History  Procedure Laterality Date  . Breast lumpectomy Left 12/2011  . Abdominal hysterectomy      partial  . Cholecystectomy    . Tubal ligation    . Colonoscopy N/A 07/01/2013    Procedure: COLONOSCOPY;  Surgeon: Rogene Houston, MD;  Location: [facility];  Service: Endoscopy;  Laterality: N/A;  84    FAMILY HISTORY: family history includes Cancer (age of onset: 69) in her mother; Cancer (age of onset: 78) in her father.  SOCIAL HISTORY:  reports that she quit smoking about 15 years ago. Her smoking use included Cigarettes. She has a 9 pack-year smoking history. She has never used smokeless tobacco. She reports that she does not drink alcohol or use illicit drugs.  REVIEW OF SYSTEMS:  Other than that discussed above is noncontributory.  PHYSICAL EXAMINATION: ECOG PERFORMANCE STATUS: 1 - Symptomatic but completely ambulatory  Blood pressure 151/72, pulse 63, temperature 97.5 F (36.4 C), temperature source Oral, resp. rate 16, weight 153 lb (69.4 kg), SpO2 100 %.  GENERAL:alert, no distress and comfortable SKIN: skin color, texture, turgor are normal, no rashes or significant lesions EYES: PERLA; Conjunctiva are pink and non-injected, sclera clear SINUSES: No redness or tenderness over maxillary or ethmoid sinuses OROPHARYNX:no exudate, no erythema on lips, buccal mucosa, or tongue. NECK: supple, thyroid normal size, non-tender, without nodularity. No masses CHEST: status post left breast lumpectomy with no masses in either breast. LYMPH:  no palpable lymphadenopathy in the cervical, axillary or inguinal LUNGS: clear to auscultation and percussion with  normal breathing effort HEART: regular rate & rhythm and no murmurs. ABDOMEN:abdomen  soft, non-tender and normal bowel sounds MUSCULOSKELETAL:no cyanosis of digits and no clubbing. Range of motion normal. No significant tremor at rest. No evidence of rigidity. Ambulates with a wide-based gait. NEURO: alert & oriented x 3 with fluent speech, no focal motor/sensory deficits   LABORATORY DATA: Lab on 05/02/2014  Component Date Value Ref Range Status  . WBC 05/02/2014 2.7* 4.0 - 10.5 K/uL Final  . RBC 05/02/2014 4.30  3.87 - 5.11 MIL/uL Final  . Hemoglobin 05/02/2014 12.1  12.0 - 15.0 g/dL Final  . HCT 05/02/2014 37.1  36.0 - 46.0 % Final  . MCV 05/02/2014 86.3  78.0 - 100.0 fL Final  . MCH 05/02/2014 28.1  26.0 - 34.0 pg Final  . MCHC 05/02/2014 32.6  30.0 - 36.0 g/dL Final  . RDW 05/02/2014 12.7  11.5 - 15.5 % Final  . Platelets 05/02/2014 PLATELET CLUMPS NOTED ON SMEAR, COUNT APPEARS ADEQUATE  150 - 400 K/uL Corrected   Comment: PLATELET CLUMPING, SUGGEST RECOLLECTION OF SAMPLE IN CITRATE TUBE. CORRECTED ON 11/16 AT 1151: PREVIOUSLY REPORTED AS 206   . Neutrophils Relative % 05/02/2014 50  43 - 77 % Final  . Lymphocytes Relative 05/02/2014 38  12 - 46 % Final  . Monocytes Relative 05/02/2014 5  3 - 12 % Final  . Eosinophils Relative 05/02/2014 6* 0 - 5 % Final  . Basophils Relative 05/02/2014 1  0 - 1 % Final  . Neutro Abs 05/02/2014 1.4* 1.7 - 7.7 K/uL Final  . Lymphs Abs 05/02/2014 1.0  0.7 - 4.0 K/uL Final  . Monocytes Absolute 05/02/2014 0.1  0.1 - 1.0 K/uL Final  . Eosinophils Absolute 05/02/2014 0.2  0.0 - 0.7 K/uL Final  . Basophils Absolute 05/02/2014 0.0  0.0 - 0.1 K/uL Final  . WBC Morphology 05/02/2014 WHITE COUNT CONFIRMED ON SMEAR   Final  . Sodium 05/02/2014 139  137 - 147 mEq/L Final  . Potassium 05/02/2014 4.5  3.7 - 5.3 mEq/L Final  . Chloride 05/02/2014 101  96 - 112 mEq/L Final  . CO2 05/02/2014 29  19 - 32 mEq/L Final  . Glucose, Bld 05/02/2014 86  70 - 99 mg/dL Final  . BUN 05/02/2014 11  6 - 23 mg/dL Final  . Creatinine, Ser 05/02/2014 0.78   0.50 - 1.10 mg/dL Final  . Calcium 05/02/2014 9.7  8.4 - 10.5 mg/dL Final  . Total Protein 05/02/2014 8.0  6.0 - 8.3 g/dL Final  . Albumin 05/02/2014 4.2  3.5 - 5.2 g/dL Final  . AST 05/02/2014 19  0 - 37 U/L Final  . ALT 05/02/2014 17  0 - 35 U/L Final  . Alkaline Phosphatase 05/02/2014 108  39 - 117 U/L Final  . Total Bilirubin 05/02/2014 0.4  0.3 - 1.2 mg/dL Final  . GFR calc non Af Amer 05/02/2014 84* >90 mL/min Final  . GFR calc Af Amer 05/02/2014 >90  >90 mL/min Final   Comment: (NOTE) The eGFR has been calculated using the CKD EPI equation. This calculation has not been validated in all clinical situations. eGFR's persistently <90 mL/min signify possible Chronic Kidney Disease.   . Anion gap 05/02/2014 9  5 - 15 Final  . CEA 05/02/2014 0.9  0.0 - 5.0 ng/mL Final   Performed at [facility]  . CA 27.29 05/02/2014 30  0 - 39 U/mL Final   Performed at [facility]    PATHOLOGY:no  new pathology.  Urinalysis No results found for: COLORURINE, APPEARANCEUR, LABSPEC, PHURINE, GLUCOSEU, HGBUR, BILIRUBINUR, KETONESUR, PROTEINUR, UROBILINOGEN, NITRITE, LEUKOCYTESUR  RADIOGRAPHIC STUDIES: No results found. Diagnostic Bilat   Status: Final result       Study Result     CLINICAL DATA: History of malignant lumpectomy of the left breast in 2013. Annual re-evaluation.  EXAM: DIGITAL DIAGNOSTIC bilateral MAMMOGRAM WITH CAD  COMPARISON: 12/29/2012, 12/24/2011.  ACR Breast Density Category b: There are scattered areas of fibroglandular density.  FINDINGS: There are post lumpectomy scarring changes located superiorly within the left breast. These are stable. There is no specific evidence for recurrent tumor or developing malignancy within either breast. The right breast parenchymal pattern is stable.  Mammographic images were processed with CAD.  IMPRESSION: Stable parenchymal pattern. No findings worrisome for recurrent tumor or developing  malignancy.  RECOMMENDATION: Bilateral diagnostic mammography in 1 year.  I have discussed the findings and recommendations with the patient. Results were also provided in writing at the conclusion of the visit. If applicable, a reminder letter will be sent to the patient regarding the next appointment.  BI-RADS CATEGORY 1: Negative.   Electronically Signed  By: Luberta Robertson M.D.  On: 07/    ASSESSMENT:  #1. Stage I left breast cancer, status post lumpectomy, sentinel node biopsy, external beam radiotherapy, currently tolerating anastrozole well.  #2. Pancreatic cleft.  #3. Chronic idiopathic cyclic neutropenia.  #4. Hypothyroidism, on treatment.  #5. Hypertension, controlled.  #6. Gastroesophageal reflux disease, on treatment.  #7. Degenerative joint disease, on treatment  PLAN:  #1. Continue anastrozole 1 mg daily. #2. Do not take Flexeril with Azilect.. #3  Follow-up in 6 months with no labs.   All questions were answered. The patient knows to call the clinic with any problems, questions or concerns. We can certainly see the patient much sooner if necessary.   I spent 25 minutes counseling the patient face to face. The total time spent in the appointment was 30 minutes.    Doroteo Bradford, MD 05/03/2014 1:33 PM  DISCLAIMER:  This note was dictated with voice recognition software.  Similar sounding words can inadvertently be transcribed inaccurately and may not be corrected upon review.

## 2014-05-03 NOTE — Progress Notes (Signed)
Lab draw

## 2014-05-04 ENCOUNTER — Encounter: Payer: Self-pay | Admitting: Neurology

## 2014-05-10 ENCOUNTER — Encounter: Payer: Self-pay | Admitting: Neurology

## 2014-06-15 DIAGNOSIS — E039 Hypothyroidism, unspecified: Secondary | ICD-10-CM | POA: Diagnosis not present

## 2014-06-22 DIAGNOSIS — K219 Gastro-esophageal reflux disease without esophagitis: Secondary | ICD-10-CM | POA: Diagnosis not present

## 2014-06-22 DIAGNOSIS — C50919 Malignant neoplasm of unspecified site of unspecified female breast: Secondary | ICD-10-CM | POA: Diagnosis not present

## 2014-06-22 DIAGNOSIS — I10 Essential (primary) hypertension: Secondary | ICD-10-CM | POA: Diagnosis not present

## 2014-06-22 DIAGNOSIS — G20 Parkinson's disease: Secondary | ICD-10-CM | POA: Diagnosis not present

## 2014-06-22 DIAGNOSIS — Z1389 Encounter for screening for other disorder: Secondary | ICD-10-CM | POA: Diagnosis not present

## 2014-06-22 DIAGNOSIS — E039 Hypothyroidism, unspecified: Secondary | ICD-10-CM | POA: Diagnosis not present

## 2014-07-28 DIAGNOSIS — Z853 Personal history of malignant neoplasm of breast: Secondary | ICD-10-CM | POA: Diagnosis not present

## 2014-09-30 ENCOUNTER — Ambulatory Visit (INDEPENDENT_AMBULATORY_CARE_PROVIDER_SITE_OTHER): Payer: Medicare Other | Admitting: Neurology

## 2014-09-30 ENCOUNTER — Encounter: Payer: Self-pay | Admitting: Neurology

## 2014-09-30 VITALS — BP 184/87 | HR 69 | Ht 62.0 in | Wt 164.0 lb

## 2014-09-30 DIAGNOSIS — G20 Parkinson's disease: Secondary | ICD-10-CM | POA: Diagnosis not present

## 2014-09-30 DIAGNOSIS — R269 Unspecified abnormalities of gait and mobility: Secondary | ICD-10-CM | POA: Diagnosis not present

## 2014-09-30 MED ORDER — RASAGILINE MESYLATE 1 MG PO TABS: 1.0000 mg | ORAL_TABLET | Freq: Every day | ORAL | Status: DC

## 2014-09-30 MED ORDER — PRAMIPEXOLE DIHYDROCHLORIDE 1 MG PO TABS: 1.5000 mg | ORAL_TABLET | Freq: Three times a day (TID) | ORAL | Status: DC

## 2014-09-30 NOTE — Progress Notes (Signed)
PATIENT: Lisa Torres DOB: [birth year]  REASON FOR VISIT: follow up HISTORY FROM: patient  HISTORY OF PRESENT ILLNESS: Lisa Torres is a 69 year old Torres with a history of idiopathic parkinson's disease  INITITIAL VISIT:  09/30/13: Lisa Torres is a 69 years old right-handed Torres,was referred by her primary care physician Dr. Consuello Masse for evaluation right hand tremor, change of her gait posture, consistent with Parkinson's Disease, .  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 Her gait has changes, become slower,.   She has no anosmia, when she gets up quickly, she has lightheadedness, she is taking ativan as needed for insomnia, she has excessive movement during her sleep.  She denies significant memory trouble, she was getting tapering dose of prednisone, 20 mg, 3 tablets 5 days followed by 2 tablets 5 days followed by 1 tablets for 5 days for unclear reasons   Her findings are consistent with idiopathic Parkinson's disease, She is now taking mirapex 52m tid, it helped her right leg pain, gait difficulty, no significant side effect, MRI of Lisa brain together, essentially normal, laboratory evaluation showed normal Lisa negative B12, RPR, TSH, C-reactive protein, ESR, ANA, HIV  UPDATE April 15th 2016: She is off balance occasionally, she is taking care of her brother, she drove to him daily, She is now taking mirapex 1.524mtid, Azilect 50m79mam, complains of unsteady gait, urinary urgency.   REVIEW OF SYSTEMS: Full 14 system review of systems  performed Lisa notable only for:  Urgency, joints pain, low back pain, aching muscles, cramps, dizziness, weakness, tremors  ALLERGIES: Allergies  Allergen Reactions  . Hydrocodone   . Tramadol     Hallucinations, abd pain    HOME MEDICATIONS: Outpatient Prescriptions Prior to Visit  Lisa Torres Sig Dispense Refill  . acetaminophen (TYLENOL) 500 MG tablet Take 1,000 mg by mouth every 6 (six) hours as needed.    . aMarland KitchenLODipine (NORVASC) 5 MG tablet Take 5 mg by mouth every morning.     . aMarland Kitchenastrozole (ARIMIDEX) 1 MG tablet Take 1 tablet (1 mg total) by mouth every morning. 90 tablet 3  . calcium carbonate (OS-CAL) 600 MG TABS tablet Take 600 mg by mouth 2 (two) times daily with a meal.    . Cholecalciferol (VITAMIN D-3) 1000 UNITS CAPS Take 1,000 Units by mouth 2 (two) times daily.    . cyclobenzaprine (FLEXERIL) 10 MG tablet Take 10 mg by mouth daily as needed. For muscle spasms    . levothyroxine (SYNTHROID, LEVOTHROID) 88 MCG tablet Take 88 mcg by mouth daily before breakfast.    . LORazepam (ATIVAN) 1 MG tablet Take 1 mg by mouth at bedtime as needed for sleep.     . naproxen sodium (ALEVE) 220 MG tablet Take 440 mg by mouth 2 (two) times daily as needed (Pain).    . oMarland Kitcheneprazole (PRILOSEC) 40 MG capsule Take 40 mg by mouth.     . polyethylene glycol (MIRALAX / GLYCOLAX) packet Take 17 g by mouth daily as needed for moderate constipation.     . pramipexole (MIRAPEX) 1 MG tablet Take 1.5 tablets (1.5 mg total) by  mouth 3 (three) times daily. 405 tablet 3  . rasagiline (AZILECT) 1 MG TABS tablet Take 1 tablet (1 mg total) by mouth daily. 30 tablet 12   No facility-administered medications prior to visit.    PAST MEDICAL HISTORY: Past Medical History  Diagnosis Date  . Breast cancer 11/2011  . Radiation 04/2012    33 treatments for breast cancer  . Hypertension   . Thyroid disease   . GERD (gastroesophageal reflux disease)     PAST SURGICAL HISTORY: Past Surgical History    Procedure Laterality Date  . Breast lumpectomy Left 12/2011  . Abdominal hysterectomy      partial  . Cholecystectomy    . Tubal ligation    . Colonoscopy N/A 07/01/2013    Procedure: COLONOSCOPY;  Surgeon: Rogene Houston, MD;  Location: [facility];  Service: Endoscopy;  Laterality: N/A;  66    FAMILY HISTORY: Family History  Problem Relation Age of Onset  . Cancer Mother 44    colon cancer  . Cancer Father 26    throat cancer    SOCIAL HISTORY: History   Social History  . Marital Status: Married    Spouse Name: Marcello Moores  . Number of Children: 3  . Years of Education: 12 th   Occupational History  .      retired   Social History Main Topics  . Smoking status: Former Smoker -- 0.50 packs/day for 18 years    Types: Cigarettes    Quit date: 09/30/1998  . Smokeless tobacco: Never Used  . Alcohol Use: No  . Drug Use: No  . Sexual Activity: Not on file   Other Topics Concern  . Not on file   Social History Narrative   Patient lives at home with her husband Marcello Moores).    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   Right handed      PHYSICAL EXAM  There were no vitals filed for this visit. There is no weight on file to calculate BMI.  PHYSICAL EXAMNIATION:  Gen: NAD, conversant, well nourised, obese, well groomed                     Cardiovascular: Regular rate rhythm, no peripheral edema, warm, nontender. Eyes: Conjunctivae clear without exudates or hemorrhage Neck: Supple, no carotid bruise. Pulmonary: Clear to auscultation bilaterally   NEUROLOGICAL EXAM:  MENTAL STATUS: Speech:    Speech is normal; fluent Lisa spontaneous with normal comprehension.  Cognition:    Lisa patient is oriented to person, place, Lisa time;     recent Lisa remote memory intact;     language fluent;     normal attention, concentration,     fund of knowledge.  CRANIAL NERVES: CN II: Visual fields are full to confrontation. Fundoscopic exam is normal with sharp discs Lisa no  vascular changes. Venous pulsations are present bilaterally. Pupils are 4 mm Lisa briskly reactive to light. Visual acuity is 20/20 bilaterally. CN III, IV, VI: extraocular movement are normal. No ptosis. CN V: Facial sensation is intact to pinprick in all 3 divisions bilaterally. Corneal responses are intact.  CN VII: Face is symmetric with normal eye closure Lisa smile. CN VIII: Hearing is normal to rubbing fingers CN IX, X: Palate elevates symmetrically. Phonation is normal. CN XI: Head turning Lisa shoulder shrug are intact CN XII: Tongue is midline with normal movements Lisa no atrophy.  MOTOR: She has moderate right more than left limb Lisa nuchal rigidity, bradykinesia  Shoulder abduction Shoulder external rotation Elbow flexion Elbow extension Wrist flexion Wrist extension Finger abduction Hip flexion Knee flexion Knee extension Ankle dorsi flexion Ankle plantar flexion  R _0 L _1 REFLEXES: Reflexes are 3 Lisa symmetric at Lisa biceps, triceps, knees, Lisa ankles. Plantar responses are flexor.  SENSORY: Light touch, pinprick, position sense, Lisa vibration sense are intact in fingers Lisa toes.  COORDINATION: Rapid alternating movements Lisa fine finger movements are intact. There is no dysmetria on finger-to-nose Lisa heel-knee-shin. There are no abnormal or extraneous movements.   GAIT/STANCE: Need to push up to get up from seated position, cautious, mildly unsteady, decreased arm swing.   DIAGNOSTIC DATA (LABS, IMAGING, TESTING) - I reviewed patient records, labs, notes, testing Lisa imaging myself where available.  Lab Results  Component Value Date   WBC 2.7* 05/02/2014   HGB 12.1 05/02/2014   HCT 37.1 05/02/2014   MCV 86.3 05/02/2014   PLT  05/02/2014    PLATELET CLUMPS NOTED ON SMEAR, COUNT APPEARS ADEQUATE      Component Value Date/Time   NA 139 05/02/2014 1015   K 4.5 05/02/2014 1015   CL 101 05/02/2014 1015   CO2 29  05/02/2014 1015   GLUCOSE 86 05/02/2014 1015   BUN 11 05/02/2014 1015   CREATININE 0.78 05/02/2014 1015   CALCIUM 9.7 05/02/2014 1015   PROT 8.0 05/02/2014 1015   ALBUMIN 4.2 05/02/2014 1015   AST 19 05/02/2014 1015   ALT 17 05/02/2014 1015   ALKPHOS 108 05/02/2014 1015   BILITOT 0.4 05/02/2014 1015   GFRNONAA 84* 05/02/2014 1015   GFRAA >90 05/02/2014 1015   Lab Results  Component Value Date   VITAMINB12 677 08/24/2013     ASSESSMENT Lisa PLAN 69 y.o. year old Torres  With Parkinson's Disease,   1. Keep mirapex 63m tid, Azilect 173mday 2. She has hyperreflexia, urinary urgency, worsening gait difficulty, MRI cervical to rule to cervical spondylytic myelopathy. 3. RTC in one month  YiMarcial PacasM.D. Ph.D.  [facility] [address] [id number]: [phone]:      [phone]

## 2014-10-04 ENCOUNTER — Telehealth: Payer: Self-pay | Admitting: Neurology

## 2014-10-04 MED ORDER — RASAGILINE MESYLATE 1 MG PO TABS: 1.0000 mg | ORAL_TABLET | Freq: Every day | ORAL | Status: DC

## 2014-10-04 NOTE — Telephone Encounter (Signed)
Rx has been resent to [facility] per patient request.  I called back to advise.  She is aware.

## 2014-10-04 NOTE — Telephone Encounter (Signed)
Patient is calling in regard to Rx azilect 1 mg.  She states she would like to have her refill at [facility] in [place] and says she can't afford to get thru [facility]. Please call.

## 2014-10-17 DIAGNOSIS — C50919 Malignant neoplasm of unspecified site of unspecified female breast: Secondary | ICD-10-CM | POA: Diagnosis not present

## 2014-10-17 DIAGNOSIS — M1990 Unspecified osteoarthritis, unspecified site: Secondary | ICD-10-CM | POA: Diagnosis not present

## 2014-10-17 DIAGNOSIS — K219 Gastro-esophageal reflux disease without esophagitis: Secondary | ICD-10-CM | POA: Diagnosis not present

## 2014-10-17 DIAGNOSIS — I10 Essential (primary) hypertension: Secondary | ICD-10-CM | POA: Diagnosis not present

## 2014-10-17 DIAGNOSIS — E039 Hypothyroidism, unspecified: Secondary | ICD-10-CM | POA: Diagnosis not present

## 2014-10-20 DIAGNOSIS — E039 Hypothyroidism, unspecified: Secondary | ICD-10-CM | POA: Diagnosis not present

## 2014-10-20 DIAGNOSIS — I10 Essential (primary) hypertension: Secondary | ICD-10-CM | POA: Diagnosis not present

## 2014-10-22 ENCOUNTER — Ambulatory Visit
Admission: RE | Admit: 2014-10-22 | Discharge: 2014-10-22 | Disposition: A | Discharge status: Home / Self Care | Payer: Medicare Other | Source: Ambulatory Visit | Attending: Neurology | Admitting: Neurology

## 2014-10-22 DIAGNOSIS — G20 Parkinson's disease: Secondary | ICD-10-CM

## 2014-10-22 DIAGNOSIS — R269 Unspecified abnormalities of gait and mobility: Secondary | ICD-10-CM

## 2014-10-24 ENCOUNTER — Telehealth: Payer: Self-pay | Admitting: Neurology

## 2014-10-24 DIAGNOSIS — G20 Parkinson's disease: Secondary | ICD-10-CM | POA: Diagnosis not present

## 2014-10-24 DIAGNOSIS — F411 Generalized anxiety disorder: Secondary | ICD-10-CM | POA: Diagnosis not present

## 2014-10-24 DIAGNOSIS — E039 Hypothyroidism, unspecified: Secondary | ICD-10-CM | POA: Diagnosis not present

## 2014-10-24 DIAGNOSIS — R05 Cough: Secondary | ICD-10-CM | POA: Diagnosis not present

## 2014-10-24 DIAGNOSIS — I10 Essential (primary) hypertension: Secondary | ICD-10-CM | POA: Diagnosis not present

## 2014-10-24 NOTE — Telephone Encounter (Signed)
Pt aware of normal results.

## 2014-10-24 NOTE — Telephone Encounter (Signed)
Lisa Torres, please call patient, MRI of cervical showed no significant abnormality   IMPRESSION: This is a minimally abnormal MRI of the cervical spine showing mild degenerative changes at C3-C4 and C4-C5 that do not lead to nerve root impingement. The spinal cord appears normal.

## 2014-10-25 ENCOUNTER — Telehealth: Payer: Self-pay | Admitting: Neurology

## 2014-10-25 NOTE — Telephone Encounter (Signed)
Michelle: please call patient, MRI cervical showed mild DJD, no significant canal or foraminal stenosis.   IMPRESSION: This is a minimally abnormal MRI of the cervical spine showing mild degenerative changes at C3-C4 and C4-C5 that do not lead to nerve root impingement. The spinal cord appears normal.

## 2014-10-25 NOTE — Telephone Encounter (Signed)
Pt aware of results 

## 2014-11-01 ENCOUNTER — Encounter (HOSPITAL_COMMUNITY): Payer: Medicare Other | Attending: Hematology & Oncology | Admitting: Hematology & Oncology

## 2014-11-01 ENCOUNTER — Encounter (HOSPITAL_COMMUNITY): Payer: Self-pay | Admitting: Hematology & Oncology

## 2014-11-01 VITALS — BP 174/80 | HR 18 | Temp 96.7°F | Wt 167.2 lb

## 2014-11-01 DIAGNOSIS — Z79810 Long term (current) use of selective estrogen receptor modulators (SERMs): Secondary | ICD-10-CM

## 2014-11-01 DIAGNOSIS — C50912 Malignant neoplasm of unspecified site of left female breast: Secondary | ICD-10-CM | POA: Diagnosis not present

## 2014-11-01 DIAGNOSIS — C50919 Malignant neoplasm of unspecified site of unspecified female breast: Secondary | ICD-10-CM

## 2014-11-01 DIAGNOSIS — Z170 Estrogen receptor positive status [ER+]: Secondary | ICD-10-CM | POA: Diagnosis not present

## 2014-11-01 DIAGNOSIS — D72818 Other decreased white blood cell count: Secondary | ICD-10-CM

## 2014-11-01 DIAGNOSIS — M7989 Other specified soft tissue disorders: Secondary | ICD-10-CM

## 2014-11-01 DIAGNOSIS — Z79899 Other long term (current) drug therapy: Secondary | ICD-10-CM

## 2014-11-01 NOTE — Progress Notes (Signed)
Lisa Hilding, MD [place] / [place] [address]   DIAGNOSIS: Stage I Left breast cancer diagnosed 01/07/2012 ER/PR positive S/p lumpectomy, sentinel node biopsy XRT Adjuvant Arimidex Chronic Leukopenia History of BMBX 30% cellularity without MDS, or other primary Bone marrow disorder  CURRENT THERAPY: Arimidex   INTERVAL HISTORY: Lisa Torres 69 y.o. female returns for follow up.  She is doing well except for constant right foot/lower leg swelling that she states started several months ago. Recently it has worsened. Reports mostly weakness and minimal pain in the foot. She denies any injury.   She is eating well. She denies hot flashes or joint pain.  Dr. Melony Overly did her colonoscopy in January 2016.  She hasn't had a DEXA done in the past 2 years   MEDICAL HISTORY: Past Medical History  Diagnosis Date  . Breast cancer 11/2011  . Radiation 04/2012    33 treatments for breast cancer  . Hypertension   . Thyroid disease   . GERD (gastroesophageal reflux disease)     has Hypothyroid; Hypertension; Parkinson disease; and Difficulty walking on her problem list.     is allergic to hydrocodone and tramadol.  Current Outpatient Prescriptions on File Prior to Visit  Medication Sig Dispense Refill  . acetaminophen (TYLENOL) 500 MG tablet Take 1,000 mg by mouth every 6 (six) hours as needed.    Marland Kitchen amLODipine (NORVASC) 5 MG tablet Take 5 mg by mouth every morning.     Marland Kitchen anastrozole (ARIMIDEX) 1 MG tablet Take 1 tablet (1 mg total) by mouth every morning. 90 tablet 3  . calcium carbonate (OS-CAL) 600 MG TABS tablet Take 600 mg by mouth 2 (two) times daily with a meal.    . Cholecalciferol (VITAMIN D-3) 1000 UNITS CAPS Take 1,000 Units by mouth 2 (two) times daily.    . cyclobenzaprine (FLEXERIL) 10 MG tablet Take 10 mg by mouth daily as needed. For muscle spasms    . levothyroxine (SYNTHROID, LEVOTHROID) 88 MCG tablet Take 88 mcg by mouth daily before breakfast.    .  naproxen sodium (ALEVE) 220 MG tablet Take 440 mg by mouth 2 (two) times daily as needed (Pain).    Marland Kitchen omeprazole (PRILOSEC) 40 MG capsule Take 40 mg by mouth.     . polyethylene glycol (MIRALAX / GLYCOLAX) packet Take 17 g by mouth daily as needed for moderate constipation.     . pramipexole (MIRAPEX) 1 MG tablet Take 1.5 tablets (1.5 mg total) by mouth 3 (three) times daily. 410 tablet 3  . rasagiline (AZILECT) 1 MG TABS tablet Take 1 tablet (1 mg total) by mouth daily. 30 tablet 11   No current facility-administered medications on file prior to visit.     SURGICAL HISTORY: Past Surgical History  Procedure Laterality Date  . Breast lumpectomy Left 12/2011  . Abdominal hysterectomy      partial  . Cholecystectomy    . Tubal ligation    . Colonoscopy N/A 07/01/2013    Procedure: COLONOSCOPY;  Surgeon: Rogene Houston, MD;  [facility]: [facility];  Service: Endoscopy;  Laterality: N/A;  1030    SOCIAL HISTORY: History   Social History  . Marital Status: Married    Spouse Name: Marcello Moores  . Number of Children: 3  . Years of Education: 12 th   Occupational History  .      retired   Social History Main Topics  . Smoking status: Former Smoker -- 0.50 packs/day for  18 years    Types: Cigarettes    Quit date: 09/30/1998  . Smokeless tobacco: Never Used  . Alcohol Use: No  . Drug Use: No  . Sexual Activity: Not on file   Other Topics Concern  . Not on file   Social History Narrative   Patient lives at home with her husband Marcello Moores).    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 Cancer Mother 80    colon cancer  . Cancer Father 75    throat cancer    Review of Systems  Constitutional: Negative for fever, chills and weight loss.  HENT: Negative for hearing loss.   Eyes: Negative for blurred vision and double vision.  Respiratory: Negative for cough.   Cardiovascular: Negative for chest pain  and palpitations.  Gastrointestinal: Negative for nausea, vomiting and abdominal pain.  Genitourinary: Negative for dysuria and hematuria.  Musculoskeletal: Positive for joint pain. Negative for falls.       Right foot swelling  Skin: Negative for rash.  Neurological: Positive for tremors and focal weakness. Negative for dizziness, tingling and headaches.  Psychiatric/Behavioral: Negative for depression.    PHYSICAL EXAMINATION  ECOG PERFORMANCE STATUS: 1 - Symptomatic but completely ambulatory  Filed Vitals:   11/01/14 1100  BP: 174/80  Pulse: 18  Temp: 96.7 F (35.9 C)    Physical Exam  Constitutional: She is oriented to person, place, and time and well-developed, well-nourished, and in no distress.  HENT:  Head: Normocephalic and atraumatic.  Nose: Nose normal.  Mouth/Throat: Oropharynx is clear and moist. No oropharyngeal exudate.  Eyes: Conjunctivae and EOM are normal. Pupils are equal, round, and reactive to light. Right eye exhibits no discharge. Left eye exhibits no discharge. No scleral icterus.  Neck: Normal range of motion. Neck supple. No tracheal deviation present. No thyromegaly present.  Cardiovascular: Normal rate, regular rhythm and normal heart sounds.  Exam reveals no gallop and no friction rub.   No murmur heard. Pulmonary/Chest: Effort normal and breath sounds normal. She has no wheezes. She has no rales.  Abdominal: Soft. Bowel sounds are normal. She exhibits no distension and no mass. There is no tenderness. There is no rebound and no guarding.  Musculoskeletal: Normal range of motion. She exhibits edema.  RLE swelling predominantly at the ankle > left  Lymphadenopathy:    She has no cervical adenopathy.  Neurological: She is alert and oriented to person, place, and time. She has normal reflexes. No cranial nerve deficit. Gait normal. Coordination normal.  Skin: Skin is warm and dry. No rash noted.  Psychiatric: Mood, memory, affect and judgment normal.    Nursing note and vitals reviewed.   LABORATORY DATA:  CBC    Component Value Date/Time   WBC 2.7* 05/02/2014 1015   RBC 4.30 05/02/2014 1015   HGB 12.1 05/02/2014 1015   HCT 37.1 05/02/2014 1015   PLT  05/02/2014 1015    PLATELET CLUMPS NOTED ON SMEAR, COUNT APPEARS ADEQUATE   MCV 86.3 05/02/2014 1015   MCH 28.1 05/02/2014 1015   MCHC 32.6 05/02/2014 1015   RDW 12.7 05/02/2014 1015   LYMPHSABS 1.0 05/02/2014 1015   MONOABS 0.1 05/02/2014 1015   EOSABS 0.2 05/02/2014 1015   BASOSABS 0.0 05/02/2014 1015   CMP     Component Value Date/Time   NA 139 05/02/2014 1015   K 4.5 05/02/2014 1015   CL  101 05/02/2014 1015   CO2 29 05/02/2014 1015   GLUCOSE 86 05/02/2014 1015   BUN 11 05/02/2014 1015   CREATININE 0.78 05/02/2014 1015   CALCIUM 9.7 05/02/2014 1015   PROT 8.0 05/02/2014 1015   ALBUMIN 4.2 05/02/2014 1015   AST 19 05/02/2014 1015   ALT 17 05/02/2014 1015   ALKPHOS 108 05/02/2014 1015   BILITOT 0.4 05/02/2014 1015   GFRNONAA 84* 05/02/2014 1015   GFRAA >90 05/02/2014 1015     ASSESSMENT and THERAPY PLAN:  Stage I Left breast cancer diagnosed 01/07/2012 ER/PR positive S/p lumpectomy, sentinel node biopsy XRT Adjuvant Arimidex Chronic Leukopenia History of BMBX 30% cellularity without MDS, or other primary Bone marrow disorder  She will continue with her Arimidex. She has excellent tolerance. She is to continue on calcium and vitamin D daily. She is due for a DEXA and we have ordered this for her. She is also due for a mammogram in July we will arrange this for her as well.  She has a chronic leukopenia that is stable.  Based upon her lower extremity swelling I recommended a Doppler ultrasound. I will keep apprised results when available.  She is up-to-date on screening colonoscopy. She will return again in 6 months with repeat laboratory studies, and office visit, and exam. She will need a breast exam at follow-up. Further studies including DEXA will be  reviewed with the patient when available.  All questions were answered. The patient knows to call the clinic with any problems, questions or concerns. We can certainly see the patient much sooner if necessary. This note was electronically signed.  This document serves as a record of services personally performed by Ancil Linsey, MD. It was created on her behalf by Pearlie Oyster, a trained medical scribe. The creation of this record is based on the scribe's personal observations and the provider's statements to them. This document has been checked and approved by the attending provider.    I have reviewed the above documentation for accuracy and completeness, and I agree with the above.  Kelby Fam. Penland MD

## 2014-11-01 NOTE — Patient Instructions (Signed)
[place] at [facility] Discharge Instructions  RECOMMENDATIONS MADE BY THE CONSULTANT AND ANY TEST RESULTS WILL BE SENT TO YOUR REFERRING PHYSICIAN.  We scheduled your imaging studies to be done, you will have labs at your next follow up visit in 78months. Call for any concerns or questions.  Thank you for choosing [place] at [facility] to provide your oncology and hematology care.  To afford each patient quality time with our provider, please arrive at least 15 minutes before your scheduled appointment time.    You need to re-schedule your appointment should you arrive 10 or more minutes late.  We strive to give you quality time with our providers, and arriving late affects you and other patients whose appointments are after yours.  Also, if you no show three or more times for appointments you may be dismissed from the clinic at the providers discretion.     Again, thank you for choosing [facility].  Our hope is that these requests will decrease the amount of time that you wait before being seen by our physicians.       _____________________________________________________________  Should you have questions after your visit to [facility], please contact our office at [phone] between the hours of 8:30 a.m. and 4:30 p.m.  Voicemails left after 4:30 p.m. will not be returned until the following business day.  For prescription refill requests, have your pharmacy contact our office.

## 2014-11-03 ENCOUNTER — Ambulatory Visit (INDEPENDENT_AMBULATORY_CARE_PROVIDER_SITE_OTHER): Payer: Medicare Other | Admitting: Neurology

## 2014-11-03 ENCOUNTER — Encounter: Payer: Self-pay | Admitting: Neurology

## 2014-11-03 VITALS — BP 167/83 | HR 60 | Ht 62.0 in | Wt 167.0 lb

## 2014-11-03 DIAGNOSIS — G20 Parkinson's disease: Secondary | ICD-10-CM | POA: Diagnosis not present

## 2014-11-03 DIAGNOSIS — R269 Unspecified abnormalities of gait and mobility: Secondary | ICD-10-CM | POA: Diagnosis not present

## 2014-11-03 NOTE — Progress Notes (Signed)
Chief Complaint  Patient presents with  . Parkinson's Disease    Feels her symptoms are slightly better.  She is still having tremors in her hands that sometimes make it difficult for her to hold things.  Her right hand is worse than left.      PATIENT: Lisa Torres DOB: Apr 16, 1946  REASON FOR VISIT: follow up HISTORY FROM: patient  HISTORY OF PRESENT ILLNESS: Lisa Torres is a 69 year old female with a history of idiopathic parkinson's disease  INITITIAL VISIT:  09/30/13: Lisa Torres is a 69 years old right-handed female,was referred by her primary care physician Dr. Consuello Masse for evaluation right hand tremor, change of her gait posture, consistent with Parkinson's Disease, .  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 Her gait has changes, become slower,.   She has no anosmia, when she gets up quickly, she has lightheadedness, she is taking ativan as needed for insomnia, she has excessive movement during her sleep.  She denies significant memory trouble, she was getting tapering dose of prednisone, 20 mg, 3 tablets 5 days followed by 2 tablets 5 days followed by 1 tablets for 5 days for unclear reasons   Her findings are consistent with idiopathic Parkinson's disease, She is now taking mirapex 52m tid, it helped her right leg pain, gait difficulty, no significant side effect, MRI of the brain together, essentially normal, laboratory evaluation showed normal and negative B12, RPR, TSH, C-reactive protein, ESR, ANA, HIV  UPDATE April 15th 2016: She  is off balance occasionally, she is taking care of her brother, she drove to him daily, She is now taking mirapex 1.544mtid, Azilect 40m57mam, complains of unsteady gait, urinary urgency.   UPDATE May 19th 2016: She is with husband today, she complains mildly increased bilateral hands tremor, she is still active, she drives, independent on daily activities, She has some urinary urgency, generalized weakness, we have reviewed MRI of cervical spine in Oct 22 2014, showed no significant foraminal or canal stenosis.    REVIEW OF SYSTEMS: Full 14 system review of systems performed and notable only for:  Urgency, joints pain, low back pain, aching muscles, cramps, dizziness, weakness, tremors  ALLERGIES: Allergies  Allergen Reactions  . Hydrocodone   . Tramadol     Hallucinations, abd pain    HOME MEDICATIONS: Outpatient Prescriptions Prior to Visit  Medication Sig Dispense Refill  . amLODipine (NORVASC) 5 MG tablet Take 5 mg by mouth every morning.     . aMarland Kitchenastrozole (ARIMIDEX) 1 MG tablet Take 1 tablet (1 mg total) by mouth every morning. 90 tablet 3  . calcium carbonate (OS-CAL) 600 MG TABS tablet Take 600 mg by mouth 2 (two) times daily with a meal.    . Cholecalciferol (VITAMIN D-3) 1000 UNITS CAPS Take 1,000 Units by mouth 2 (two) times daily.    . naproxen sodium (ALEVE) 220 MG tablet Take 440 mg by mouth 2 (two) times daily as needed (Pain).    . oMarland Kitcheneprazole (PRILOSEC) 40 MG capsule Take 40 mg by mouth.     . polyethylene glycol (MIRALAX / GLYCOLAX) packet Take 17 g by  mouth daily as needed for moderate constipation.     . pramipexole (MIRAPEX) 1 MG tablet Take 1.5 tablets (1.5 mg total) by mouth 3 (three) times daily. 410 tablet 3  . rasagiline (AZILECT) 1 MG TABS tablet Take 1 tablet (1 mg total) by mouth daily. 30 tablet 11  . acetaminophen (TYLENOL) 500 MG tablet Take 1,000 mg by mouth every 6 (six) hours as needed.    . cyclobenzaprine (FLEXERIL) 10 MG tablet Take 10 mg by mouth  daily as needed. For muscle spasms    . levothyroxine (SYNTHROID, LEVOTHROID) 88 MCG tablet Take 88 mcg by mouth daily before breakfast.     No facility-administered medications prior to visit.    PAST MEDICAL HISTORY: Past Medical History  Diagnosis Date  . Breast cancer 11/2011  . Radiation 04/2012    33 treatments for breast cancer  . Hypertension   . Thyroid disease   . GERD (gastroesophageal reflux disease)     PAST SURGICAL HISTORY: Past Surgical History  Procedure Laterality Date  . Breast lumpectomy Left 12/2011  . Abdominal hysterectomy      partial  . Cholecystectomy    . Tubal ligation    . Colonoscopy N/A 07/01/2013    Procedure: COLONOSCOPY;  Surgeon: Rogene Houston, MD;  [facility]: [facility];  Service: Endoscopy;  Laterality: N/A;  14    FAMILY HISTORY: Family History  Problem Relation Age of Onset  . Cancer Mother 67    colon cancer  . Cancer Father 53    throat cancer    SOCIAL HISTORY: History   Social History  . Marital Status: Married    Spouse Name: Marcello Moores  . Number of Children: 3  . Years of Education: 12 th   Occupational History  .      retired   Social History Main Topics  . Smoking status: Former Smoker -- 0.50 packs/day for 18 years    Types: Cigarettes    Quit date: 09/30/1998  . Smokeless tobacco: Never Used  . Alcohol Use: No  . Drug Use: No  . Sexual Activity: Not on file   Other Topics Concern  . Not on file   Social History Narrative   Patient lives at home with her husband Marcello Moores).    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  PHYSICAL EXAMNIATION:  Gen: NAD, conversant, well nourised, obese, well groomed                     Cardiovascular: Regular rate rhythm, no peripheral edema, warm, nontender. Eyes: Conjunctivae clear  without exudates or hemorrhage Neck: Supple, no carotid bruise. Pulmonary: Clear to auscultation bilaterally   NEUROLOGICAL EXAM:  MENTAL STATUS: Speech:    Speech is normal; fluent and spontaneous with normal comprehension.  Cognition:    The patient is oriented to person, place, and time;     recent and remote memory intact;     language fluent;     normal attention, concentration,     fund of knowledge.  CRANIAL NERVES: CN II: Visual fields are full to confrontation. Fundoscopic exam is normal with sharp discs and no vascular changes. Venous pulsations are present bilaterally. Pupils are 4 mm and briskly reactive to  light. Visual acuity is 20/20 bilaterally. CN III, IV, VI: extraocular movement are normal. No ptosis. CN V: Facial sensation is intact to pinprick in all 3 divisions bilaterally. Corneal responses are intact.  CN VII: Face is symmetric with normal eye closure and smile. CN VIII: Hearing is normal to rubbing fingers CN IX, X: Palate elevates symmetrically. Phonation is normal. CN XI: Head turning and shoulder shrug are intact CN XII: Tongue is midline with normal movements and no atrophy.  MOTOR: She has moderate right more than left limb and nuchal rigidity, bradykinesia  REFLEXES: Reflexes are 3 and symmetric at the biceps, triceps, knees, and ankles. Plantar responses are flexor.  SENSORY: Light touch, pinprick, position sense, and vibration sense are intact in fingers and toes.  COORDINATION: Rapid alternating movements and fine finger movements are intact. There is no dysmetria on finger-to-nose and heel-knee-shin. There are no abnormal or extraneous movements.   GAIT/STANCE: Need to push up to get up from seated position, cautious, mildly unsteady, decreased arm swing.   DIAGNOSTIC DATA (LABS, IMAGING, TESTING) - I reviewed patient records, labs, notes, testing and imaging myself where available.  Lab Results  Component Value Date   WBC 2.7*  05/02/2014   HGB 12.1 05/02/2014   HCT 37.1 05/02/2014   MCV 86.3 05/02/2014   PLT  05/02/2014    PLATELET CLUMPS NOTED ON SMEAR, COUNT APPEARS ADEQUATE      Component Value Date/Time   NA 139 05/02/2014 1015   K 4.5 05/02/2014 1015   CL 101 05/02/2014 1015   CO2 29 05/02/2014 1015   GLUCOSE 86 05/02/2014 1015   BUN 11 05/02/2014 1015   CREATININE 0.78 05/02/2014 1015   CALCIUM 9.7 05/02/2014 1015   PROT 8.0 05/02/2014 1015   ALBUMIN 4.2 05/02/2014 1015   AST 19 05/02/2014 1015   ALT 17 05/02/2014 1015   ALKPHOS 108 05/02/2014 1015   BILITOT 0.4 05/02/2014 1015   GFRNONAA 84* 05/02/2014 1015   GFRAA >90 05/02/2014 1015   Lab Results  Component Value Date   VITAMINB12 677 08/24/2013     ASSESSMENT AND PLAN 69 y.o. year old female  With Parkinson's Disease.  1. Keep mirapex 26m tid, Azilect 11mday 2. RTC in 6 months with CaHoyle Sauerkeep moderate exercise  YiMarcial PacasM.D. Ph.D.  [facility] [address] [id number]: [phone]:      [phone]

## 2014-11-04 ENCOUNTER — Ambulatory Visit (HOSPITAL_COMMUNITY)
Admission: RE | Admit: 2014-11-04 | Discharge: 2014-11-04 | Disposition: A | Discharge status: Home / Self Care | Payer: Medicare Other | Source: Ambulatory Visit | Attending: Hematology & Oncology | Admitting: Hematology & Oncology

## 2014-11-04 DIAGNOSIS — C50919 Malignant neoplasm of unspecified site of unspecified female breast: Secondary | ICD-10-CM | POA: Insufficient documentation

## 2014-11-04 DIAGNOSIS — M7989 Other specified soft tissue disorders: Secondary | ICD-10-CM | POA: Insufficient documentation

## 2014-11-04 DIAGNOSIS — Z79899 Other long term (current) drug therapy: Secondary | ICD-10-CM | POA: Diagnosis not present

## 2014-11-04 DIAGNOSIS — R2241 Localized swelling, mass and lump, right lower limb: Secondary | ICD-10-CM | POA: Diagnosis not present

## 2014-11-07 ENCOUNTER — Ambulatory Visit (HOSPITAL_COMMUNITY)
Admission: RE | Admit: 2014-11-07 | Discharge: 2014-11-07 | Disposition: A | Discharge status: Home / Self Care | Payer: Medicare Other | Source: Ambulatory Visit | Attending: Hematology & Oncology | Admitting: Hematology & Oncology

## 2014-11-07 DIAGNOSIS — C50919 Malignant neoplasm of unspecified site of unspecified female breast: Secondary | ICD-10-CM | POA: Insufficient documentation

## 2014-11-07 DIAGNOSIS — Z79899 Other long term (current) drug therapy: Secondary | ICD-10-CM | POA: Diagnosis not present

## 2014-11-07 DIAGNOSIS — M7989 Other specified soft tissue disorders: Secondary | ICD-10-CM | POA: Diagnosis not present

## 2014-11-07 DIAGNOSIS — Z780 Asymptomatic menopausal state: Secondary | ICD-10-CM | POA: Diagnosis not present

## 2014-11-20 DIAGNOSIS — I10 Essential (primary) hypertension: Secondary | ICD-10-CM | POA: Diagnosis not present

## 2014-12-07 DIAGNOSIS — E039 Hypothyroidism, unspecified: Secondary | ICD-10-CM | POA: Diagnosis not present

## 2014-12-20 DIAGNOSIS — E039 Hypothyroidism, unspecified: Secondary | ICD-10-CM | POA: Diagnosis not present

## 2015-01-09 ENCOUNTER — Other Ambulatory Visit (HOSPITAL_COMMUNITY): Payer: Self-pay | Admitting: Hematology & Oncology

## 2015-01-09 DIAGNOSIS — Z9889 Other specified postprocedural states: Secondary | ICD-10-CM

## 2015-01-09 DIAGNOSIS — C50912 Malignant neoplasm of unspecified site of left female breast: Secondary | ICD-10-CM

## 2015-01-12 IMAGING — MG MM DIGITAL DIAGNOSTIC BILAT
6 series · 6 of 6 positions shown · non-contrast
Comparison: 12/29/2012, 12/24/2011.

CLINICAL DATA: History of malignant lumpectomy of the left breast
in 6897. Annual re-evaluation.

EXAM:
DIGITAL DIAGNOSTIC  bilateral MAMMOGRAM WITH CAD

[L CC]
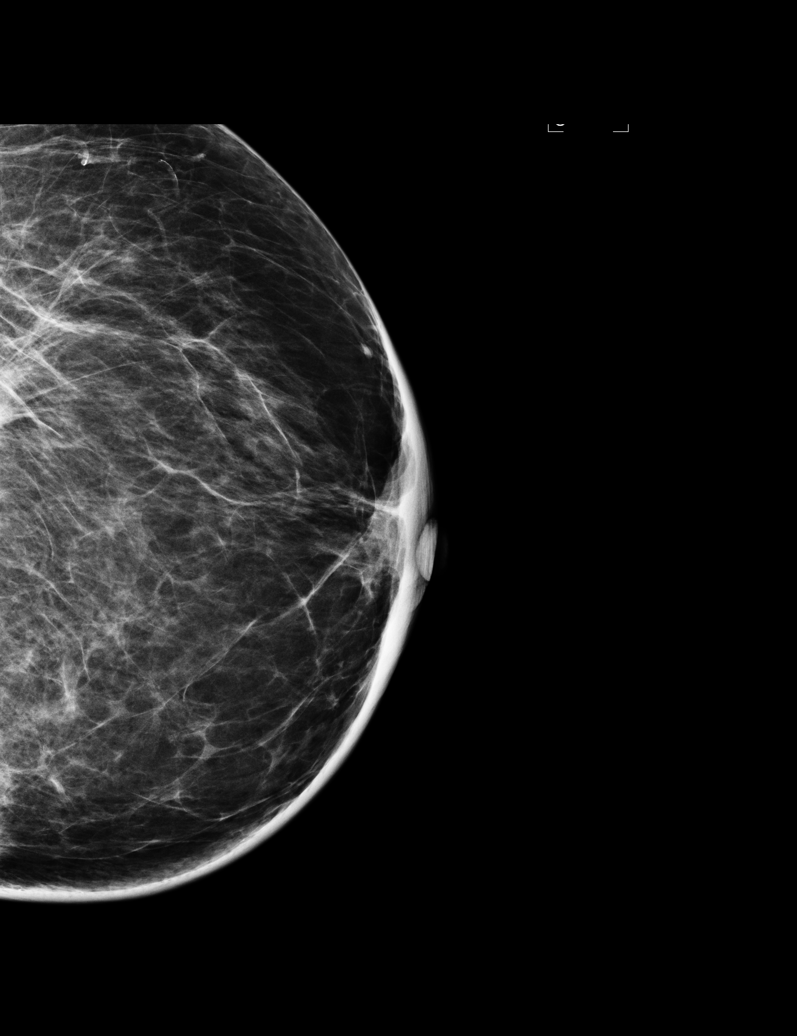

[L MLO]
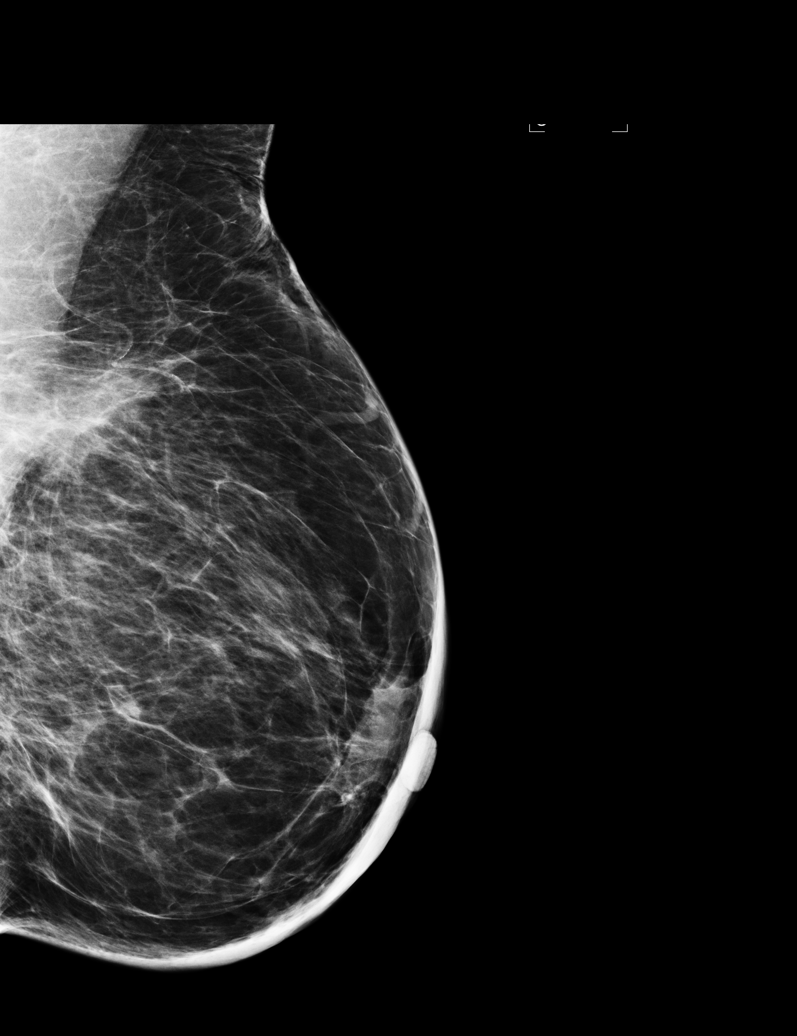

[R CC (1 of 2)]
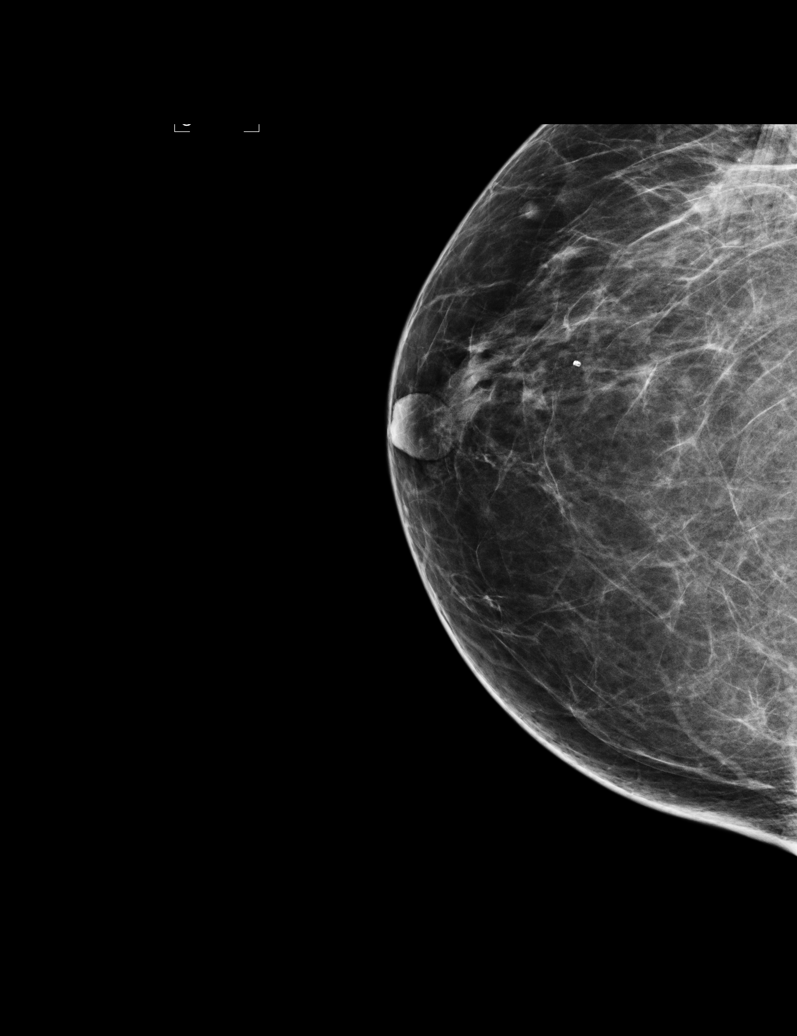

[R MLO]
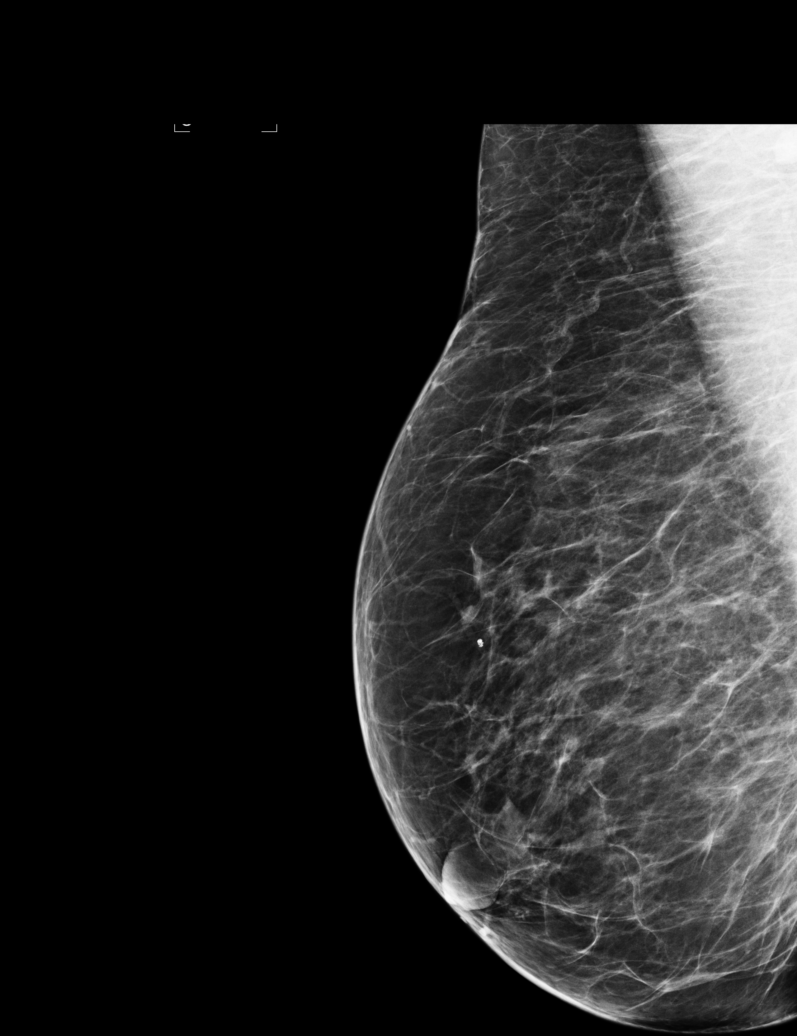

[L TAN]
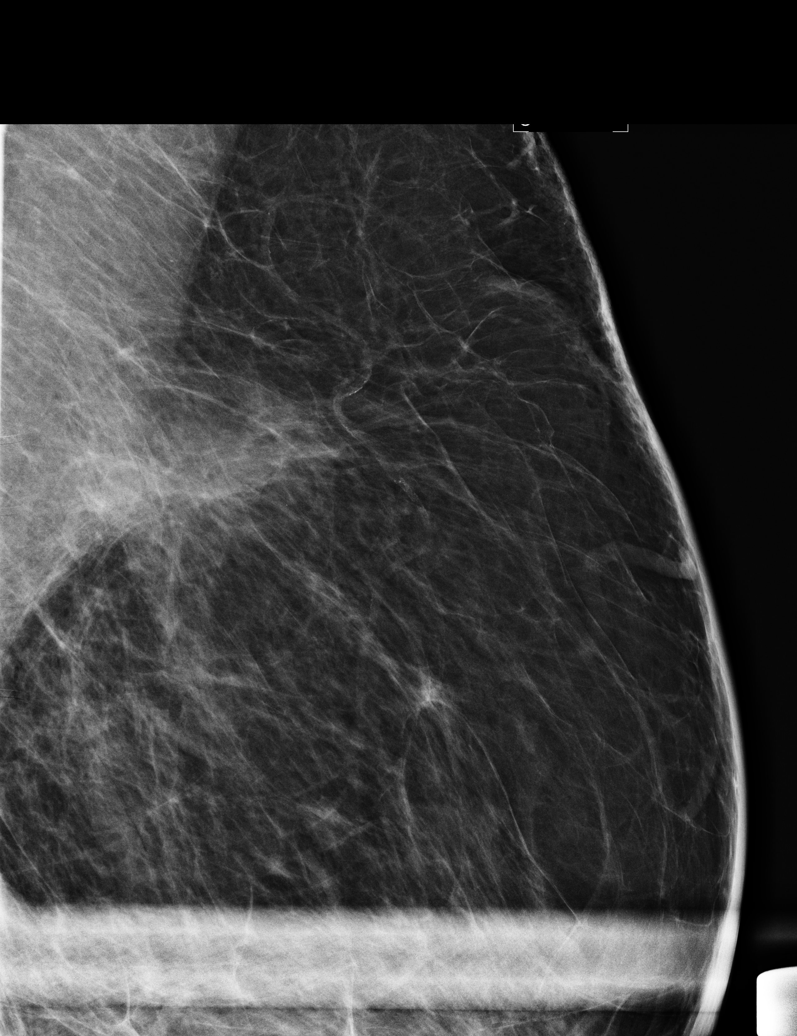

[R CC (2 of 2)]
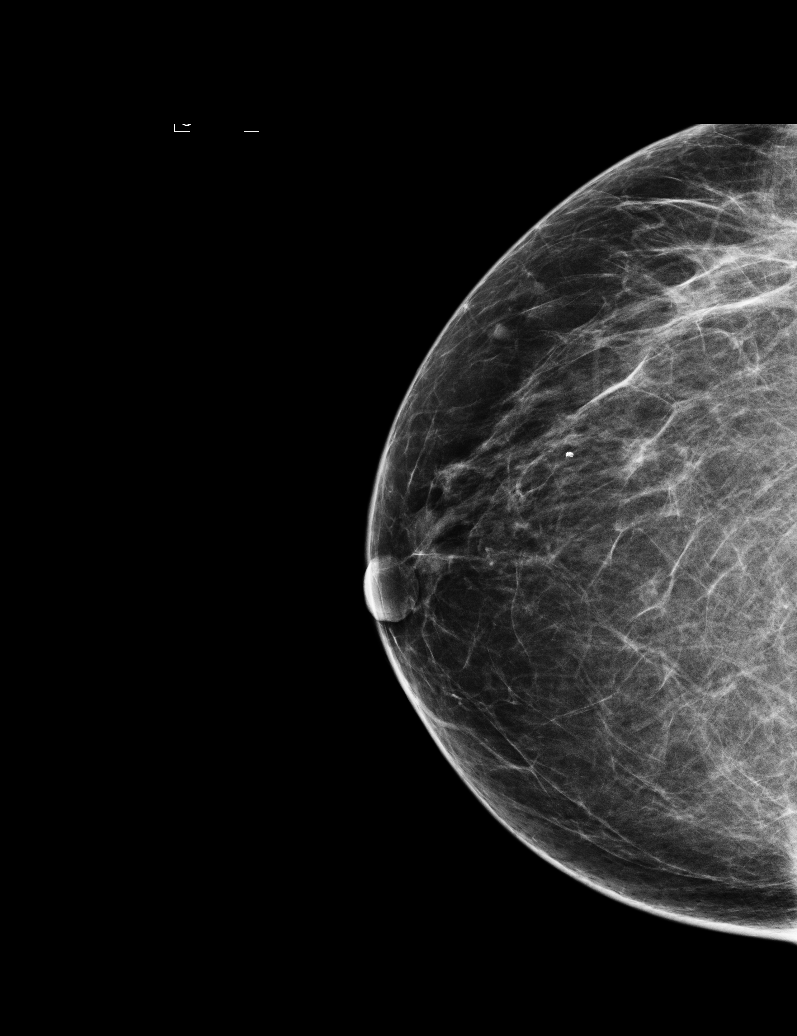

[6 of 6 positions shown; findings below may reference images not displayed]

ACR Breast Density Category b: There are scattered areas of
fibroglandular density.
FINDINGS: There are post lumpectomy scarring changes located superiorly within
the left breast. These are stable. There is no specific evidence for
recurrent tumor or developing malignancy within either breast. The
right breast parenchymal pattern is stable.

Mammographic images were processed with CAD.
IMPRESSION: Stable parenchymal pattern. No findings worrisome for recurrent
tumor or developing malignancy.

RECOMMENDATION:
Bilateral diagnostic mammography in 1 year.

I have discussed the findings and recommendations with the patient.
Results were also provided in writing at the conclusion of the
visit. If applicable, a reminder letter will be sent to the patient
regarding the next appointment.

BI-RADS CATEGORY  1: Negative.

## 2015-01-16 ENCOUNTER — Ambulatory Visit (HOSPITAL_COMMUNITY): Payer: Medicare Other

## 2015-01-17 ENCOUNTER — Ambulatory Visit (HOSPITAL_COMMUNITY)
Admission: RE | Admit: 2015-01-17 | Discharge: 2015-01-17 | Disposition: A | Discharge status: Home / Self Care | Payer: Medicare Other | Source: Ambulatory Visit | Attending: Hematology & Oncology | Admitting: Hematology & Oncology

## 2015-01-17 DIAGNOSIS — R928 Other abnormal and inconclusive findings on diagnostic imaging of breast: Secondary | ICD-10-CM | POA: Diagnosis not present

## 2015-01-17 DIAGNOSIS — C50912 Malignant neoplasm of unspecified site of left female breast: Secondary | ICD-10-CM | POA: Insufficient documentation

## 2015-01-17 DIAGNOSIS — Z9889 Other specified postprocedural states: Secondary | ICD-10-CM

## 2015-01-26 DIAGNOSIS — E039 Hypothyroidism, unspecified: Secondary | ICD-10-CM | POA: Diagnosis not present

## 2015-01-31 DIAGNOSIS — Z853 Personal history of malignant neoplasm of breast: Secondary | ICD-10-CM | POA: Diagnosis not present

## 2015-02-22 DIAGNOSIS — R600 Localized edema: Secondary | ICD-10-CM | POA: Diagnosis not present

## 2015-02-23 DIAGNOSIS — M79604 Pain in right leg: Secondary | ICD-10-CM | POA: Diagnosis not present

## 2015-02-23 DIAGNOSIS — R531 Weakness: Secondary | ICD-10-CM | POA: Diagnosis not present

## 2015-02-23 DIAGNOSIS — R600 Localized edema: Secondary | ICD-10-CM | POA: Diagnosis not present

## 2015-02-23 DIAGNOSIS — M79605 Pain in left leg: Secondary | ICD-10-CM | POA: Diagnosis not present

## 2015-02-26 DIAGNOSIS — I10 Essential (primary) hypertension: Secondary | ICD-10-CM | POA: Diagnosis not present

## 2015-03-27 NOTE — Patient Outreach (Signed)
[facility]) Care Management  03/27/2015  Lisa Torres 31-Jan-1946 [id number]   Referral from MD, assigned Kandis Mannan, RN to outreach for [place] [facility] services.  Thanks, Ronnell Freshwater. [place], [place] Assistant Phone: [phone] Fax: [phone]

## 2015-03-28 ENCOUNTER — Other Ambulatory Visit: Payer: Self-pay | Admitting: *Deleted

## 2015-03-28 DIAGNOSIS — I10 Essential (primary) hypertension: Secondary | ICD-10-CM | POA: Diagnosis not present

## 2015-03-28 NOTE — Patient Outreach (Signed)
Call to patient to discuss referral from MD office RNCM reviewed [facility] program services, patient agrees to participate and gives verbal consent.  During call, patient reporting she is having issues with her legs, she is having a lot of pain that is impacting her quality of life, states that she is missing her exercise classes because of the pain. She is having to pay for the classes even though she is not able to participate. She would appreciate RNCM calling MD about her appointment and seeing if they can move it to a sooner date and time.  RNCM called to MD office to re-schedule appointment for patient, patient old appointment was 11/14 and lab was 11/9 Spoke with Jinny Blossom and rescheduled for 10/25 and 10/21 for labs. RNCM scheduled home visit for initial assessment for 10/26 Patient has RNCM contact for questions or concerns. Royetta Crochet. Laymond Purser, RN, BSN, [place] [phone]

## 2015-04-07 DIAGNOSIS — E039 Hypothyroidism, unspecified: Secondary | ICD-10-CM | POA: Diagnosis not present

## 2015-04-11 DIAGNOSIS — F411 Generalized anxiety disorder: Secondary | ICD-10-CM | POA: Diagnosis not present

## 2015-04-11 DIAGNOSIS — R05 Cough: Secondary | ICD-10-CM | POA: Diagnosis not present

## 2015-04-11 DIAGNOSIS — E039 Hypothyroidism, unspecified: Secondary | ICD-10-CM | POA: Diagnosis not present

## 2015-04-11 DIAGNOSIS — G20 Parkinson's disease: Secondary | ICD-10-CM | POA: Diagnosis not present

## 2015-04-11 DIAGNOSIS — Z23 Encounter for immunization: Secondary | ICD-10-CM | POA: Diagnosis not present

## 2015-04-11 DIAGNOSIS — M1611 Unilateral primary osteoarthritis, right hip: Secondary | ICD-10-CM | POA: Diagnosis not present

## 2015-04-11 DIAGNOSIS — I10 Essential (primary) hypertension: Secondary | ICD-10-CM | POA: Diagnosis not present

## 2015-04-11 DIAGNOSIS — M170 Bilateral primary osteoarthritis of knee: Secondary | ICD-10-CM | POA: Diagnosis not present

## 2015-04-12 ENCOUNTER — Other Ambulatory Visit: Payer: Self-pay | Admitting: *Deleted

## 2015-04-12 ENCOUNTER — Encounter: Payer: Self-pay | Admitting: *Deleted

## 2015-04-12 NOTE — Patient Outreach (Signed)
[facility]) Care Management   04/12/2015  Lisa Torres 03-26-1946 [id number]  Lisa Torres is an 69 y.o. female  Subjective:  Patient reports her legs are still bothering her. She saw MD and he had her go for x-rays-no results yet. Patient states she has BP cuff, but it always gives high readings. Patient would like to lose some weight and she would like to get back to her exercise classes. She has had to stop them due to leg/hip pain.   Objective:   BP 136/72 mmHg  Pulse 75  Resp 20  Ht 1.549 m (5\' 1" )  Wt 178 lb (80.74 kg)  BMI 33.65 kg/m2  SpO2 98% Review of Systems  Constitutional: Negative.   HENT: Negative.   Respiratory: Negative.   Cardiovascular: Positive for leg swelling.  Gastrointestinal: Negative.   Skin: Negative.   Endo/Heme/Allergies: Negative.   Psychiatric/Behavioral: Negative.     Physical Exam  Constitutional: She is oriented to person, place, and time. She appears well-developed and well-nourished.  Neck: Normal range of motion.  Cardiovascular: Normal rate and regular rhythm.   GI: Soft. Bowel sounds are normal.  Neurological: She is alert and oriented to person, place, and time.  Skin: Skin is warm and dry.    Current Medications:   Current Outpatient Prescriptions  Medication Sig Dispense Refill  . anastrozole (ARIMIDEX) 1 MG tablet Take 1 tablet (1 mg total) by mouth every morning. 90 tablet 3  . calcium carbonate (OS-CAL) 600 MG TABS tablet Take 600 mg by mouth 2 (two) times daily with a meal.    . Cholecalciferol (VITAMIN D-3) 1000 UNITS CAPS Take 1,000 Units by mouth 2 (two) times daily.    Marland Kitchen levothyroxine (SYNTHROID, LEVOTHROID) 112 MCG tablet Take 112 mcg by mouth daily before breakfast.    . naproxen sodium (ALEVE) 220 MG tablet Take 440 mg by mouth 2 (two) times daily as needed (Pain).    Marland Kitchen omeprazole (PRILOSEC) 40 MG capsule Take 40 mg by mouth.     . polyethylene glycol (MIRALAX / GLYCOLAX) packet  Take 17 g by mouth daily as needed for moderate constipation.     . pramipexole (MIRAPEX) 1 MG tablet Take 1.5 tablets (1.5 mg total) by mouth 3 (three) times daily. 410 tablet 3  . rasagiline (AZILECT) 1 MG TABS tablet Take 1 tablet (1 mg total) by mouth daily. 30 tablet 11  . amLODipine (NORVASC) 5 MG tablet Take 5 mg by mouth every morning.     Marland Kitchen levothyroxine (SYNTHROID, LEVOTHROID) 100 MCG tablet Take 100 mcg by mouth daily before breakfast.     No current facility-administered medications for this visit.    Functional Status:   In your present state of health, do you have any difficulty performing the following activities: 04/12/2015  Hearing? N  Vision? Y  Difficulty concentrating or making decisions? N  Walking or climbing stairs? Y  Dressing or bathing? N  Doing errands, shopping? N  Preparing Food and eating ? N  Using the Toilet? N  In the past six months, have you accidently leaked urine? N  Do you have problems with loss of bowel control? N  Managing your Medications? N  Managing your Finances? N  Housekeeping or managing your Housekeeping? N    Fall/Depression Screening:    Fall Risk  04/12/2015 05/03/2014  Falls in the past year? Yes No  Number falls in past yr: 1 -  Injury with Fall? No -  Risk Factor  Category  High Fall Risk -  Risk for fall due to : History of fall(s) -  Follow up Falls evaluation completed -   PHQ 2/9 Scores 04/12/2015  PHQ - 2 Score 2  PHQ- 9 Score 5    Assessment:   HTN Parkinsons Pain  Plan:  [facility] CM Care Plan Problem One        Most Recent Value   Care Plan Problem One  High blood pressure   Role Documenting the Problem One  Care Management Coordinator   Care Plan for Problem One  Active   THN Long Term Goal (31-90 days)  Patient will obtain an accurate blood pressure monitor over the next  60 days   THN Long Term Goal Start Date  04/12/15   Interventions for Problem One Long Term Goal  Using teach back method reviewed  importance of having accurate BP cuff   THN CM Short Term Goal #1 (0-30 days)  Patient will monitor BP and write in blue THN calendar   [facility] CM Short Term Goal #1 Start Date  04/12/15   Interventions for Short Term Goal #1  Using teachback method, reviewed BP log and discussed checking weekly and when to contact MD with readings     Will fax MD initial assessment Will visit again next month Patient will call about BP cuff and try and get replacement cuff and start checking BP and writing in log and reporting any abnormal/high readings to MD.  Royetta Crochet. Laymond Purser, RN, BSN, [place] [phone]

## 2015-04-13 NOTE — Patient Outreach (Signed)
Encounter opened in error. Lisa Torres. Lisa Purser, RN, BSN, [place] [phone]

## 2015-04-14 NOTE — Patient Outreach (Signed)
[facility]) Care Management  04/14/2015  Lisa Torres [birth year] [id number]   Notification from Burgess Amor, RN patient is now active.  Thanks, Ronnell Freshwater. [place], [place] Assistant Phone: [phone] Fax: [phone]

## 2015-04-17 ENCOUNTER — Other Ambulatory Visit: Payer: Self-pay | Admitting: *Deleted

## 2015-04-17 NOTE — Patient Outreach (Signed)
Telephone call to patient in response to message left. Call back to patient. Patient reports that she received call back from her x-rays and they told her it was arthritis.  Patient plans to go to chiropractor for treatment. She states that she wanted RNCM to know about her results and her plan. She reports the pain in her legs is really keeping her up at night and she needs treatment. Plan to continue [facility] community services. Royetta Crochet. Laymond Purser, RN, BSN, [place] [phone]

## 2015-04-18 DIAGNOSIS — M5441 Lumbago with sciatica, right side: Secondary | ICD-10-CM | POA: Diagnosis not present

## 2015-04-18 DIAGNOSIS — S338XXA Sprain of other parts of lumbar spine and pelvis, initial encounter: Secondary | ICD-10-CM | POA: Diagnosis not present

## 2015-04-18 DIAGNOSIS — M47816 Spondylosis without myelopathy or radiculopathy, lumbar region: Secondary | ICD-10-CM | POA: Diagnosis not present

## 2015-04-18 DIAGNOSIS — M9903 Segmental and somatic dysfunction of lumbar region: Secondary | ICD-10-CM | POA: Diagnosis not present

## 2015-04-20 DIAGNOSIS — M5441 Lumbago with sciatica, right side: Secondary | ICD-10-CM | POA: Diagnosis not present

## 2015-04-20 DIAGNOSIS — M47816 Spondylosis without myelopathy or radiculopathy, lumbar region: Secondary | ICD-10-CM | POA: Diagnosis not present

## 2015-04-20 DIAGNOSIS — M9903 Segmental and somatic dysfunction of lumbar region: Secondary | ICD-10-CM | POA: Diagnosis not present

## 2015-04-21 DIAGNOSIS — M5441 Lumbago with sciatica, right side: Secondary | ICD-10-CM | POA: Diagnosis not present

## 2015-04-21 DIAGNOSIS — M9903 Segmental and somatic dysfunction of lumbar region: Secondary | ICD-10-CM | POA: Diagnosis not present

## 2015-04-21 DIAGNOSIS — M47816 Spondylosis without myelopathy or radiculopathy, lumbar region: Secondary | ICD-10-CM | POA: Diagnosis not present

## 2015-04-24 DIAGNOSIS — M9903 Segmental and somatic dysfunction of lumbar region: Secondary | ICD-10-CM | POA: Diagnosis not present

## 2015-04-24 DIAGNOSIS — M5441 Lumbago with sciatica, right side: Secondary | ICD-10-CM | POA: Diagnosis not present

## 2015-04-24 DIAGNOSIS — M47816 Spondylosis without myelopathy or radiculopathy, lumbar region: Secondary | ICD-10-CM | POA: Diagnosis not present

## 2015-04-27 DIAGNOSIS — M9903 Segmental and somatic dysfunction of lumbar region: Secondary | ICD-10-CM | POA: Diagnosis not present

## 2015-04-27 DIAGNOSIS — M5441 Lumbago with sciatica, right side: Secondary | ICD-10-CM | POA: Diagnosis not present

## 2015-04-27 DIAGNOSIS — M47816 Spondylosis without myelopathy or radiculopathy, lumbar region: Secondary | ICD-10-CM | POA: Diagnosis not present

## 2015-04-28 ENCOUNTER — Telehealth (HOSPITAL_COMMUNITY): Payer: Self-pay | Admitting: Hematology & Oncology

## 2015-04-28 DIAGNOSIS — R079 Chest pain, unspecified: Secondary | ICD-10-CM | POA: Diagnosis not present

## 2015-04-28 DIAGNOSIS — I10 Essential (primary) hypertension: Secondary | ICD-10-CM | POA: Diagnosis not present

## 2015-04-28 NOTE — Telephone Encounter (Signed)
[facility]imidex. Call pt to let her know that the issue was cleared up but she was not home left msg with spouse

## 2015-05-01 DIAGNOSIS — M47816 Spondylosis without myelopathy or radiculopathy, lumbar region: Secondary | ICD-10-CM | POA: Diagnosis not present

## 2015-05-01 DIAGNOSIS — M5441 Lumbago with sciatica, right side: Secondary | ICD-10-CM | POA: Diagnosis not present

## 2015-05-01 DIAGNOSIS — M9903 Segmental and somatic dysfunction of lumbar region: Secondary | ICD-10-CM | POA: Diagnosis not present

## 2015-05-04 ENCOUNTER — Other Ambulatory Visit (HOSPITAL_COMMUNITY): Payer: BC Managed Care – PPO

## 2015-05-04 ENCOUNTER — Ambulatory Visit (HOSPITAL_COMMUNITY): Payer: BC Managed Care – PPO | Admitting: Hematology & Oncology

## 2015-05-04 DIAGNOSIS — M47816 Spondylosis without myelopathy or radiculopathy, lumbar region: Secondary | ICD-10-CM | POA: Diagnosis not present

## 2015-05-04 DIAGNOSIS — M9903 Segmental and somatic dysfunction of lumbar region: Secondary | ICD-10-CM | POA: Diagnosis not present

## 2015-05-04 DIAGNOSIS — M5441 Lumbago with sciatica, right side: Secondary | ICD-10-CM | POA: Diagnosis not present

## 2015-05-05 ENCOUNTER — Encounter (HOSPITAL_COMMUNITY): Payer: Medicare Other | Attending: Hematology & Oncology | Admitting: Hematology & Oncology

## 2015-05-05 ENCOUNTER — Encounter (HOSPITAL_COMMUNITY): Payer: Self-pay | Admitting: Hematology & Oncology

## 2015-05-05 ENCOUNTER — Encounter (HOSPITAL_COMMUNITY): Payer: Medicare Other

## 2015-05-05 VITALS — BP 191/78 | HR 70 | Temp 98.6°F | Resp 18 | Wt 170.2 lb

## 2015-05-05 DIAGNOSIS — Z170 Estrogen receptor positive status [ER+]: Secondary | ICD-10-CM | POA: Diagnosis not present

## 2015-05-05 DIAGNOSIS — M7989 Other specified soft tissue disorders: Secondary | ICD-10-CM | POA: Diagnosis not present

## 2015-05-05 DIAGNOSIS — D72819 Decreased white blood cell count, unspecified: Secondary | ICD-10-CM | POA: Diagnosis not present

## 2015-05-05 DIAGNOSIS — Z79811 Long term (current) use of aromatase inhibitors: Secondary | ICD-10-CM

## 2015-05-05 DIAGNOSIS — Z79899 Other long term (current) drug therapy: Secondary | ICD-10-CM

## 2015-05-05 DIAGNOSIS — C50912 Malignant neoplasm of unspecified site of left female breast: Secondary | ICD-10-CM | POA: Diagnosis not present

## 2015-05-05 DIAGNOSIS — C50919 Malignant neoplasm of unspecified site of unspecified female breast: Secondary | ICD-10-CM

## 2015-05-05 LAB — CBC WITH DIFFERENTIAL/PLATELET
BASOS ABS: 0 10*3/uL (ref 0.0–0.1)
Basophils Relative: 0 %
EOS PCT: 5 %
Eosinophils Absolute: 0.2 10*3/uL (ref 0.0–0.7)
HCT: 36.2 % (ref 36.0–46.0)
Hemoglobin: 11.7 g/dL — ABNORMAL LOW (ref 12.0–15.0)
Lymphocytes Relative: 27 %
Lymphs Abs: 0.9 10*3/uL (ref 0.7–4.0)
MCH: 27.9 pg (ref 26.0–34.0)
MCHC: 32.3 g/dL (ref 30.0–36.0)
MCV: 86.4 fL (ref 78.0–100.0)
MONO ABS: 0.2 10*3/uL (ref 0.1–1.0)
Monocytes Relative: 6 %
Neutro Abs: 1.9 10*3/uL (ref 1.7–7.7)
Neutrophils Relative %: 62 %
PLATELETS: 237 10*3/uL (ref 150–400)
RBC: 4.19 MIL/uL (ref 3.87–5.11)
RDW: 12.2 % (ref 11.5–15.5)
WBC: 3.2 10*3/uL — ABNORMAL LOW (ref 4.0–10.5)

## 2015-05-05 LAB — COMPREHENSIVE METABOLIC PANEL
ALBUMIN: 4.2 g/dL (ref 3.5–5.0)
ALT: 20 U/L (ref 14–54)
AST: 22 U/L (ref 15–41)
Alkaline Phosphatase: 80 U/L (ref 38–126)
Anion gap: 8 (ref 5–15)
BUN: 11 mg/dL (ref 6–20)
CHLORIDE: 103 mmol/L (ref 101–111)
CO2: 28 mmol/L (ref 22–32)
CREATININE: 0.85 mg/dL (ref 0.44–1.00)
Calcium: 9.5 mg/dL (ref 8.9–10.3)
Glucose, Bld: 93 mg/dL (ref 65–99)
Potassium: 3.8 mmol/L (ref 3.5–5.1)
Sodium: 139 mmol/L (ref 135–145)
TOTAL PROTEIN: 7.7 g/dL (ref 6.5–8.1)
Total Bilirubin: 0.5 mg/dL (ref 0.3–1.2)

## 2015-05-05 MED ORDER — ANASTROZOLE 1 MG PO TABS: 1.0000 mg | ORAL_TABLET | Freq: Every morning | ORAL | Status: DC

## 2015-05-05 NOTE — Patient Instructions (Addendum)
..  [place] at [facility] Discharge Instructions  RECOMMENDATIONS MADE BY THE CONSULTANT AND ANY TEST RESULTS WILL BE SENT TO YOUR REFERRING PHYSICIAN.  [facility] in [place] Return in 6 months  Thank you for choosing [place] at [facility] to provide your oncology and hematology care.  To afford each patient quality time with our provider, please arrive at least 15 minutes before your scheduled appointment time.    You need to re-schedule your appointment should you arrive 10 or more minutes late.  We strive to give you quality time with our providers, and arriving late affects you and other patients whose appointments are after yours.  Also, if you no show three or more times for appointments you may be dismissed from the clinic at the providers discretion.     Again, thank you for choosing [facility].  Our hope is that these requests will decrease the amount of time that you wait before being seen by our physicians.       _____________________________________________________________  Should you have questions after your visit to [facility], please contact our office at [phone] between the hours of 8:30 a.m. and 4:30 p.m.  Voicemails left after 4:30 p.m. will not be returned until the following business day.  For prescription refill requests, have your pharmacy contact our office.

## 2015-05-05 NOTE — Progress Notes (Signed)
Lisa Hilding, MD [place] / [place] [address]   DIAGNOSIS: Stage I Left breast cancer diagnosed 01/07/2012 ER/PR positive S/p lumpectomy, sentinel node biopsy XRT Adjuvant Arimidex Chronic Leukopenia History of BMBX 30% cellularity without MDS, or other primary Bone marrow disorder  CURRENT THERAPY: Arimidex   INTERVAL HISTORY: Lisa Torres 69 y.o. female returns for follow up of Stage I Left Breast cancer diagnosed in 2013, chronic leukopenia.   She is eating well. She denies hot flashes or joint pain.  Dr. Melony Overly did her colonoscopy in January 2016.   Lisa Torres is here alone today. Her medication mix up has been squared away.  She is up to date on her DEXA.  She says her leg isn't doing well; it's still "swelling, and aching in pain." She says she doesn't remember hurting it, that she knows of. She confirms arthritis. In terms of localization of the swelling, she indicates that her entire leg seems to be swelling. She says it's worse at night, but gets better a little in the morning. She is currently wearing stockings to try to help control it. She remarks that it "stings" in the back of her leg, around the underside of her knee.  She had an ultrasound regarding her leg at [facility]. She says that this was in September, and confirms that since then nothing's gotten worse, but that it hasn't gotten better either.  She denies any changes other than her leg pain and swelling.  For Thanksgiving, she states that she will be cooking and eating.  She denies nausea and vomiting, constipation, diarrhea, change in appetite or energy level.   MEDICAL HISTORY: Past Medical History  Diagnosis Date  . Breast cancer ([place]) 11/2011  . Radiation 04/2012    33 treatments for breast cancer  . Hypertension   . Thyroid disease   . GERD (gastroesophageal reflux disease)     has Hypothyroid; Hypertension; Parkinson disease ([place]); and Difficulty walking on her problem  list.     is allergic to hydrocodone and tramadol.  Current Outpatient Prescriptions on File Prior to Visit  Medication Sig Dispense Refill  . calcium carbonate (OS-CAL) 600 MG TABS tablet Take 600 mg by mouth 2 (two) times daily with a meal.    . Cholecalciferol (VITAMIN D-3) 1000 UNITS CAPS Take 1,000 Units by mouth 2 (two) times daily.    Marland Kitchen levothyroxine (SYNTHROID, LEVOTHROID) 112 MCG tablet Take 112 mcg by mouth daily before breakfast.    . omeprazole (PRILOSEC) 40 MG capsule Take 40 mg by mouth.     . polyethylene glycol (MIRALAX / GLYCOLAX) packet Take 17 g by mouth daily as needed for moderate constipation.     . pramipexole (MIRAPEX) 1 MG tablet Take 1.5 tablets (1.5 mg total) by mouth 3 (three) times daily. 410 tablet 3  . rasagiline (AZILECT) 1 MG TABS tablet Take 1 tablet (1 mg total) by mouth daily. 30 tablet 11  . amLODipine (NORVASC) 5 MG tablet Take 5 mg by mouth every morning.     . naproxen sodium (ALEVE) 220 MG tablet Take 440 mg by mouth 2 (two) times daily as needed (Pain).     No current facility-administered medications on file prior to visit.     SURGICAL HISTORY: Past Surgical History  Procedure Laterality Date  . Breast lumpectomy Left 12/2011  . Abdominal hysterectomy      partial  . Cholecystectomy    . Tubal ligation    . Colonoscopy  N/A 07/01/2013    Procedure: COLONOSCOPY;  Surgeon: Rogene Houston, MD;  Location: [facility];  Service: Endoscopy;  Laterality: N/A;  1030    SOCIAL HISTORY: Social History   Social History  . Marital Status: Married    Spouse Name: Marcello Moores  . Number of Children: 3  . Years of Education: 12 th   Occupational History  .      retired   Social History Main Topics  . Smoking status: Former Smoker -- 0.50 packs/day for 18 years    Types: Cigarettes    Quit date: 09/30/1998  . Smokeless tobacco: Never Used  . Alcohol Use: No  . Drug Use: No  . Sexual Activity: Not on file   Other Topics Concern  . Not on  file   Social History Narrative   Patient lives at home with her husband Marcello Moores).    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 Cancer Mother 63    colon cancer  . Cancer Father 75    throat cancer    Review of Systems  Constitutional: Negative for fever, chills and weight loss.  HENT: Negative for hearing loss.   Eyes: Negative for blurred vision and double vision.  Respiratory: Negative for cough.   Cardiovascular: Negative for chest pain and palpitations.  Gastrointestinal: Negative for nausea, vomiting and abdominal pain.  Genitourinary: Negative for dysuria and hematuria.  Musculoskeletal: Positive for joint pain. Negative for falls.       Right foot/leg swelling  Skin: Negative for rash.  Neurological: Positive for tremors and focal weakness. Negative for dizziness, tingling and headaches.  Psychiatric/Behavioral: Negative for depression.   14 point review of systems was performed and is negative except as detailed under history of present illness and above  PHYSICAL EXAMINATION  ECOG PERFORMANCE STATUS: 1 - Symptomatic but completely ambulatory  Filed Vitals:   05/05/15 1038  BP: 191/78  Pulse: 70  Temp: 98.6 F (37 C)  Resp: 18    Physical Exam  Constitutional: She is oriented to person, place, and time and well-developed, well-nourished, and in no distress.  HENT:  Head: Normocephalic and atraumatic.  Nose: Nose normal.  Mouth/Throat: Oropharynx is clear and moist. No oropharyngeal exudate.  Eyes: Conjunctivae and EOM are normal. Pupils are equal, round, and reactive to light. Right eye exhibits no discharge. Left eye exhibits no discharge. No scleral icterus.  Neck: Normal range of motion. Neck supple. No tracheal deviation present. No thyromegaly present.  Cardiovascular: Normal rate, regular rhythm and normal heart sounds.  Exam reveals no gallop and no friction rub.   No  murmur heard. Pulmonary/Chest: Effort normal and breath sounds normal. She has no wheezes. She has no rales.  Abdominal: Soft. Bowel sounds are normal. She exhibits no distension and no mass. There is no tenderness. There is no rebound and no guarding.  Musculoskeletal: Normal range of motion. She exhibits edema.  RLE swelling predominantly at the ankle > left  Lymphadenopathy:    She has no cervical adenopathy.  Neurological: She is alert and oriented to person, place, and time. She has normal reflexes. No cranial nerve deficit. Gait normal. Coordination normal.  Skin: Skin is warm and dry. No rash noted.  Psychiatric: Mood, memory, affect and judgment normal.  Nursing note and vitals reviewed.   LABORATORY DATA: I have reviewed the data as listed  CBC  Component Value Date/Time   WBC 3.2* 05/05/2015 1028   RBC 4.19 05/05/2015 1028   HGB 11.7* 05/05/2015 1028   HCT 36.2 05/05/2015 1028   PLT 237 05/05/2015 1028   MCV 86.4 05/05/2015 1028   MCH 27.9 05/05/2015 1028   MCHC 32.3 05/05/2015 1028   RDW 12.2 05/05/2015 1028   LYMPHSABS 0.9 05/05/2015 1028   MONOABS 0.2 05/05/2015 1028   EOSABS 0.2 05/05/2015 1028   BASOSABS 0.0 05/05/2015 1028   CMP     Component Value Date/Time   NA 139 05/05/2015 1028   K 3.8 05/05/2015 1028   CL 103 05/05/2015 1028   CO2 28 05/05/2015 1028   GLUCOSE 93 05/05/2015 1028   BUN 11 05/05/2015 1028   CREATININE 0.85 05/05/2015 1028   CALCIUM 9.5 05/05/2015 1028   PROT 7.7 05/05/2015 1028   ALBUMIN 4.2 05/05/2015 1028   AST 22 05/05/2015 1028   ALT 20 05/05/2015 1028   ALKPHOS 80 05/05/2015 1028   BILITOT 0.5 05/05/2015 1028   GFRNONAA >60 05/05/2015 1028   GFRAA >60 05/05/2015 1028     ASSESSMENT and THERAPY PLAN:  Stage I Left breast cancer diagnosed 01/07/2012 ER/PR positive S/p lumpectomy, sentinel node biopsy XRT Adjuvant Arimidex Chronic Leukopenia History of BMBX 30% cellularity without MDS, or other primary Bone marrow  disorder DEXA 11/07/2014 with normal bone density  She will continue with her Arimidex. She has excellent tolerance. She is to continue on calcium and vitamin D daily. She is up to date on her DEXA and had normal bone density.  She is up to date on her mammography. She will be due again next August.   She has a chronic leukopenia that is stable.  She is up-to-date on screening colonoscopy. She will return again in 6 months with repeat laboratory studies, and office visit, and exam. She will need a breast exam at follow-up.   I will review the results of her ultrasound from [facility]. She will need to sign a release for this.   All questions were answered. The patient knows to call the clinic with any problems, questions or concerns. We can certainly see the patient much sooner if necessary.  This document serves as a record of services personally performed by Ancil Linsey, MD. It was created on her behalf by Toni Amend, a trained medical scribe. The creation of this record is based on the scribe's personal observations and the provider's statements to them. This document has been checked and approved by the attending provider.  I have reviewed the above documentation for accuracy and completeness, and I agree with the above.  Kelby Fam. [place] MD

## 2015-05-08 ENCOUNTER — Encounter (INDEPENDENT_AMBULATORY_CARE_PROVIDER_SITE_OTHER): Payer: Self-pay

## 2015-05-08 ENCOUNTER — Encounter: Payer: Self-pay | Admitting: Nurse Practitioner

## 2015-05-08 ENCOUNTER — Ambulatory Visit (INDEPENDENT_AMBULATORY_CARE_PROVIDER_SITE_OTHER): Payer: Medicare Other | Admitting: Nurse Practitioner

## 2015-05-08 VITALS — BP 184/96 | HR 76 | Ht 61.5 in | Wt 170.2 lb

## 2015-05-08 DIAGNOSIS — I10 Essential (primary) hypertension: Secondary | ICD-10-CM

## 2015-05-08 DIAGNOSIS — G20 Parkinson's disease: Secondary | ICD-10-CM | POA: Diagnosis not present

## 2015-05-08 DIAGNOSIS — R262 Difficulty in walking, not elsewhere classified: Secondary | ICD-10-CM | POA: Diagnosis not present

## 2015-05-08 NOTE — Progress Notes (Signed)
[facility]  PATIENT: Lisa Torres DOB: 06-May-1946   REASON FOR VISIT: Follow-up for Parkinson's disease, gait abnormality HISTORY FROM: Patient    HISTORY OF PRESENT ILLNESS: HISTORY: INITITIAL VISIT: 09/30/13: Lisa Torres is a 69 years old right-handed female,was referred by her primary care physician Dr. Consuello Masse for evaluation right hand tremor, change of her gait posture, consistent with Parkinson's Disease, . 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 Her gait has changes, become slower,.  She has no anosmia, when she gets up quickly, she has lightheadedness, she is taking ativan as needed for insomnia, she has excessive movement during her sleep.  She denies significant memory trouble, she was getting tapering dose of prednisone, 20 mg, 3 tablets 5 days followed by 2 tablets 5 days followed by 1 tablets for 5 days for unclear reasons  Her findings are consistent with idiopathic Parkinson's disease, She is now taking mirapex 54m tid, it helped her right leg pain, gait difficulty, no significant side effect, MRI of the brain together, essentially normal, laboratory evaluation showed normal and negative B12, RPR, TSH, C-reactive protein, ESR, ANA, HIV  UPDATE April 15th 2016:She is off balance occasionally, she is taking care of her brother, she drove to him daily, She is now taking mirapex 1.576mtid, Azilect 64m18mam, complains of unsteady gait, urinary urgency.  UPDATE May 19th 2016:She is with husband today, she  complains mildly increased bilateral hands tremor, she is still active, she drives, independent on daily activities, She has some urinary urgency, generalized weakness, we have reviewed MRI of cervical spine in Oct 22 2014, showed no significant foraminal or canal stenosis.  UPDATE 05/08/2015 Ms. GarBelford9 64ar old female returns for follow-up. She has idiopathic Parkinson's disease. She has had one fall in the last 6 months. She does not use a cane. No tremor is evident on exam today. She continues to drive , is  independent in activities of daily living. She is currently on Mirapex without any compulsive behaviors and on Azilect with good control of her Parkinson symptoms. She does chair exercises. She returns for reevaluation   REVIEW OF SYSTEMS: Full 14 system review of systems performed and notable only for those listed, all others are neg:  Constitutional: Fatigue  Cardiovascular: Leg swelling Ear/Nose/Throat: neg  Skin: neg Eyes: neg Respiratory: neg Gastroitestinal: neg  Hematology/Lymphatic: neg  Endocrine: neg Musculoskeletal: Walking difficulty, joint pain Allergy/Immunology: neg Neurological: Weakness Psychiatric: neg Sleep : Restless legs ALLERGIES: Allergies  Allergen Reactions  . Hydrocodone   . Tramadol     Hallucinations, abd pain    HOME MEDICATIONS: Outpatient Prescriptions Prior to Visit  Medication Sig Dispense Refill  . anastrozole (ARIMIDEX) 1 MG tablet Take 1 tablet (1 mg total) by mouth every morning. 90 tablet 3  . calcium carbonate (OS-CAL) 600 MG TABS tablet Take 600 mg by mouth 2 (two) times daily with a meal.    . Cholecalciferol (VITAMIN D-3) 1000 UNITS CAPS Take 1,000 Units by mouth 2 (two) times daily.    . lMarland Kitchenvothyroxine (SYNTHROID, LEVOTHROID) 112 MCG tablet Take 112 mcg by mouth daily before breakfast.    .  naproxen sodium (ALEVE) 220 MG tablet Take 440 mg by mouth 2 (two) times daily as needed (Pain).    Marland Kitchen omeprazole (PRILOSEC) 40 MG capsule  Take 40 mg by mouth.     . polyethylene glycol (MIRALAX / GLYCOLAX) packet Take 17 g by mouth daily as needed for moderate constipation.     . pramipexole (MIRAPEX) 1 MG tablet Take 1.5 tablets (1.5 mg total) by mouth 3 (three) times daily. 410 tablet 3  . rasagiline (AZILECT) 1 MG TABS tablet Take 1 tablet (1 mg total) by mouth daily. 30 tablet 11  . amLODipine (NORVASC) 5 MG tablet Take 5 mg by mouth every morning.      No facility-administered medications prior to visit.    PAST MEDICAL HISTORY: Past Medical History  Diagnosis Date  . Breast cancer ([place]) 11/2011  . Radiation 04/2012    33 treatments for breast cancer  . Hypertension   . Thyroid disease   . GERD (gastroesophageal reflux disease)     PAST SURGICAL HISTORY: Past Surgical History  Procedure Laterality Date  . Breast lumpectomy Left 12/2011  . Abdominal hysterectomy      partial  . Cholecystectomy    . Tubal ligation    . Colonoscopy N/A 07/01/2013    Procedure: COLONOSCOPY;  Surgeon: Rogene Houston, MD;  Location: [facility];  Service: Endoscopy;  Laterality: N/A;  57    FAMILY HISTORY: Family History  Problem Relation Age of Onset  . Cancer Mother 94    colon cancer  . Cancer Father 28    throat cancer    SOCIAL HISTORY: Social History   Social History  . Marital Status: Married    Spouse Name: Marcello Moores  . Number of Children: 3  . Years of Education: 12 th   Occupational History  .      retired   Social History Main Topics  . Smoking status: Former Smoker -- 0.50 packs/day for 18 years    Types: Cigarettes    Quit date: 09/30/1998  . Smokeless tobacco: Never Used  . Alcohol Use: No  . Drug Use: No  . Sexual Activity: Not on file   Other Topics Concern  . Not on file   Social History Narrative   Patient lives at home with her husband Marcello Moores).    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   Right handed     PHYSICAL EXAM  Filed Vitals:   05/08/15 1305 05/08/15 1316  BP: 205/96  184/96  Pulse: 76   Height: 5' 1.5" (1.562 m)   Weight: 170 lb 3.2 oz (77.202 kg)    Body mass index is 31.64 kg/(m^2). Gen: NAD, conversant, well nourised, obese, well groomed  Cardiovascular: Regular rate rhythm, 1+ peripheral edema, compression stockings on Neck: Supple, no carotid bruit. NEUROLOGICAL EXAM:  MENTAL STATUS: Speech:  Speech is normal; fluent and spontaneous with normal comprehension.  Cognition:The patient is oriented to person, place, and time; recent and remote memory intact;  language fluent; normal attention, concentration, fund of knowledge.  CRANIAL NERVES: CN II: Visual fields are full to confrontation. Fundoscopic exam is normal with sharp discs and no vascular changes. Venous pulsations are present bilaterally. Pupils are 4 mm and briskly reactive to light. . CN III, IV, VI: extraocular movement are normal. No ptosis. CN V: Facial sensation is intact to pinprick in all 3 divisions bilaterally. .  CN VII: Face is symmetric with normal eye closure and smile. CN VIII: Hearing is  normal to rubbing fingers CN IX, X: Palate elevates symmetrically. Phonation is normal. CN XI: Head turning and shoulder shrug are intact CN XII: Tongue is midline with normal movements and no atrophy.  MOTOR: She has moderate right more than left limb and nuchal rigidity,mild bradykinesia  REFLEXES: Reflexes are 3 and symmetric at the biceps, triceps, knees, and ankles. Plantar responses are flexor.  SENSORY: Light touch, pinprick, position sense, and vibration sense are intact in fingers and toes.  COORDINATION: Rapid alternating movements and fine finger movements are intact. There is no dysmetria on finger-to-nose and heel-knee-shin. There are no abnormal or extraneous movements.   GAIT/STANCE: Can arise from the chair with arms crossed ,  cautious mildly unsteady, decreased arm swing. No assistive device   DIAGNOSTIC DATA (LABS, IMAGING,  TESTING) - I reviewed patient records, labs, notes, testing and imaging myself where available.  Lab Results  Component Value Date   WBC 3.2* 05/05/2015   HGB 11.7* 05/05/2015   HCT 36.2 05/05/2015   MCV 86.4 05/05/2015   PLT 237 05/05/2015      Component Value Date/Time   NA 139 05/05/2015 1028   K 3.8 05/05/2015 1028   CL 103 05/05/2015 1028   CO2 28 05/05/2015 1028   GLUCOSE 93 05/05/2015 1028   BUN 11 05/05/2015 1028   CREATININE 0.85 05/05/2015 1028   CALCIUM 9.5 05/05/2015 1028   PROT 7.7 05/05/2015 1028   ALBUMIN 4.2 05/05/2015 1028   AST 22 05/05/2015 1028   ALT 20 05/05/2015 1028   ALKPHOS 80 05/05/2015 1028   BILITOT 0.5 05/05/2015 1028   GFRNONAA >60 05/05/2015 1028   GFRAA >60 05/05/2015 1028    ASSESSMENT AND PLAN  69 y.o. year old female  has a past medical history of ; Hypertension; and idiopathic Parkinson's disease here to follow-up. Mild gait abnormality  Continue Mirapex at current dose does not need refills Continue Azilect at current dose does not need refills Continue to do chair exercises If you feel unsteady with her gait use a cane Follow-up with Dr. Quintin Alto regarding your blood pressure resume previous blood pressure medication until seen Follow-up with [place] in 6 months Dennie Bible, [facility], [facility], [facility] [address] [place], [place] [address] [phone]

## 2015-05-08 NOTE — Patient Instructions (Signed)
Continue Mirapex at current dose does not need refills Continue Azilect at current dose does not need refills Continue to do chair exercises If you feel unsteady with her gait use a cane Follow-up with Dr. Quintin Alto regarding your blood pressure Follow-up with [place] in 6 months

## 2015-05-09 DIAGNOSIS — M5441 Lumbago with sciatica, right side: Secondary | ICD-10-CM | POA: Diagnosis not present

## 2015-05-09 DIAGNOSIS — M9903 Segmental and somatic dysfunction of lumbar region: Secondary | ICD-10-CM | POA: Diagnosis not present

## 2015-05-09 DIAGNOSIS — M47816 Spondylosis without myelopathy or radiculopathy, lumbar region: Secondary | ICD-10-CM | POA: Diagnosis not present

## 2015-05-10 NOTE — Progress Notes (Signed)
I have reviewed and agreed above plan. 

## 2015-05-15 ENCOUNTER — Other Ambulatory Visit: Payer: Self-pay | Admitting: *Deleted

## 2015-05-15 ENCOUNTER — Encounter: Payer: Self-pay | Admitting: *Deleted

## 2015-05-15 NOTE — Patient Outreach (Signed)
[facility]) Care Management   05/15/2015  Lisa Torres 04-18-1946 [id number]  Lisa Torres is an 69 y.o. female  Subjective:  "I got a good report from my doctor in [place]"  Patient still reports pain in legs, seeing chiropractor, she reports it is not helping too much, she is considering going to ortho for an injection as she has done this in the past and it worked for a while. She states they are still thinking it is arthritis mostly that is causing the pain.  Patient reports BP was up, restarted amlodipine 5mg   Patient has new wrist cuff BP monitor, has not used it as of yet.  Objective:   Patient neatly groomed and dressed BP 138/56 mmHg  Pulse 68  Wt 170 lb (77.111 kg)  SpO2 98% Review of Systems  Constitutional: Negative.   HENT: Negative.   Eyes: Negative.   Respiratory: Negative.   Gastrointestinal: Negative.   Genitourinary: Negative.   Musculoskeletal: Positive for myalgias and joint pain.  Skin: Negative.   Neurological:       Hx of Parkinson's  Endo/Heme/Allergies: Negative.   Psychiatric/Behavioral: Negative.     Physical Exam  Constitutional: She appears well-developed and well-nourished.  HENT:  Head: Normocephalic.  Neck: Normal range of motion.  Musculoskeletal:  Ambulates slowly, sometimes uses a cane  Neurological: She is alert.  Skin: Skin is warm and dry.    Current Medications:   Current Outpatient Prescriptions  Medication Sig Dispense Refill  . amLODipine (NORVASC) 5 MG tablet Take 5 mg by mouth every morning.     Marland Kitchen anastrozole (ARIMIDEX) 1 MG tablet Take 1 tablet (1 mg total) by mouth every morning. 90 tablet 3  . calcium carbonate (OS-CAL) 600 MG TABS tablet Take 600 mg by mouth 2 (two) times daily with a meal.    . Cholecalciferol (VITAMIN D-3) 1000 UNITS CAPS Take 1,000 Units by mouth 2 (two) times daily.    Marland Kitchen levothyroxine (SYNTHROID, LEVOTHROID) 112 MCG tablet Take 112 mcg by mouth daily before  breakfast.    . naproxen sodium (ALEVE) 220 MG tablet Take 440 mg by mouth 2 (two) times daily as needed (Pain).    Marland Kitchen omeprazole (PRILOSEC) 40 MG capsule Take 40 mg by mouth.     . polyethylene glycol (MIRALAX / GLYCOLAX) packet Take 17 g by mouth daily as needed for moderate constipation.     . pramipexole (MIRAPEX) 1 MG tablet Take 1.5 tablets (1.5 mg total) by mouth 3 (three) times daily. 410 tablet 3  . rasagiline (AZILECT) 1 MG TABS tablet Take 1 tablet (1 mg total) by mouth daily. 30 tablet 11   No current facility-administered medications for this visit.    Functional Status:   In your present state of health, do you have any difficulty performing the following activities: 04/12/2015  Hearing? N  Vision? Y  Difficulty concentrating or making decisions? N  Walking or climbing stairs? Y  Dressing or bathing? N  Doing errands, shopping? N  Preparing Food and eating ? N  Using the Toilet? N  In the past six months, have you accidently leaked urine? N  Do you have problems with loss of bowel control? N  Managing your Medications? N  Managing your Finances? N  Housekeeping or managing your Housekeeping? N    Fall/Depression Screening:    Fall Risk  04/12/2015 05/03/2014  Falls in the past year? Yes No  Number falls in past yr: 1 -  Injury  with Fall? No -  Risk Factor Category  High Fall Risk -  Risk for fall due to : History of fall(s) -  Follow up Falls evaluation completed -   PHQ 2/9 Scores 04/12/2015  PHQ - 2 Score 2  PHQ- 9 Score 5    Assessment:   HTN-restarted medication, assisted patient with use of new wrist BP cuff Parkinson's-stable per Neuro, this past OV Pain-chronic leg/knee pain, patient going to Chiropractor. RNCM discussed trying water aerobics under her silver sneakers benefit at local [employer]  Plan:  [place] Problem One        Most Recent Value   Care Plan Problem One  High blood pressure   Role Documenting the Problem One  Care Management  [place] for Problem One  Active   THN Long Term Goal (31-90 days)  Patient will obtain an accurate blood pressure monitor over the next  60 days   THN Long Term Goal Start Date  04/12/15   Interventions for Problem One Long Term Goal  Patient did purchase new cuff, but it was not accurate with manual cuff.  RNCM will provide cuff from [facility] office. Instructed patient to continue BP meds and to check BP and docuement that it was with wrist cuff   THN CM Short Term Goal #1 (0-30 days)  Patient will monitor BP and write in blue THN calendar   [facility] CM Short Term Goal #1 Start Date  04/12/15   Interventions for Short Term Goal #1  Reinstructed patient to log BP readings in blue THN calendar     Patient will monitor BP with wrist cuff and document in BP log Will fax MD update and that patient back on amlodipine per neuro. RNCM will bring BP monitor at next visit Possible discharge.   Royetta Crochet. Laymond Purser, RN, BSN, [place] [phone]

## 2015-05-16 DIAGNOSIS — M9903 Segmental and somatic dysfunction of lumbar region: Secondary | ICD-10-CM | POA: Diagnosis not present

## 2015-05-16 DIAGNOSIS — S338XXA Sprain of other parts of lumbar spine and pelvis, initial encounter: Secondary | ICD-10-CM | POA: Diagnosis not present

## 2015-05-16 DIAGNOSIS — M47816 Spondylosis without myelopathy or radiculopathy, lumbar region: Secondary | ICD-10-CM | POA: Diagnosis not present

## 2015-05-16 DIAGNOSIS — M5441 Lumbago with sciatica, right side: Secondary | ICD-10-CM | POA: Diagnosis not present

## 2015-05-23 DIAGNOSIS — E039 Hypothyroidism, unspecified: Secondary | ICD-10-CM | POA: Diagnosis not present

## 2015-05-23 DIAGNOSIS — S338XXA Sprain of other parts of lumbar spine and pelvis, initial encounter: Secondary | ICD-10-CM | POA: Diagnosis not present

## 2015-05-23 DIAGNOSIS — M5441 Lumbago with sciatica, right side: Secondary | ICD-10-CM | POA: Diagnosis not present

## 2015-05-23 DIAGNOSIS — M9903 Segmental and somatic dysfunction of lumbar region: Secondary | ICD-10-CM | POA: Diagnosis not present

## 2015-05-23 DIAGNOSIS — M47816 Spondylosis without myelopathy or radiculopathy, lumbar region: Secondary | ICD-10-CM | POA: Diagnosis not present

## 2015-05-28 DIAGNOSIS — I10 Essential (primary) hypertension: Secondary | ICD-10-CM | POA: Diagnosis not present

## 2015-05-28 DIAGNOSIS — R079 Chest pain, unspecified: Secondary | ICD-10-CM | POA: Diagnosis not present

## 2015-05-29 ENCOUNTER — Other Ambulatory Visit: Payer: Self-pay | Admitting: *Deleted

## 2015-05-29 ENCOUNTER — Encounter: Payer: Self-pay | Admitting: *Deleted

## 2015-05-29 NOTE — Patient Outreach (Signed)
[facility]) Care Management   05/29/2015  Lisa Torres 1946/06/11 [id number]  Lisa Torres is an 69 y.o. female  Subjective:   "the doctor's office called and said my blood work was good, no changes to my medications"  Patient reports she has been keeping log of BP readings  Patient states she is still going to chiropractor, visits down to weekly. She states that she will look into silver sneakers after she finishes chiropractor visits. Patient states that pain is about 3-4, she has "good days and bad days", states it depends on the weather and her activity. Will continue with chiropractor for pain.  Objective:   Review of Systems  Constitutional: Negative.   HENT: Negative.   Eyes: Negative.   Respiratory: Negative.   Cardiovascular: Positive for leg swelling.       Leg swelling "off and on" wearing TED hose  Gastrointestinal: Negative.   Genitourinary: Negative.   Musculoskeletal: Negative.   Skin: Negative.   Neurological: Negative.   Endo/Heme/Allergies: Negative.   Psychiatric/Behavioral: Negative.     Physical Exam  Constitutional: She appears well-developed.  Neck: Normal range of motion.  Cardiovascular: Normal rate and regular rhythm.   Respiratory: Effort normal.  GI: Soft.  Musculoskeletal:  Patient ambulates slowly due to parkinson's  Neurological: She is alert.  Skin: Skin is warm and dry.  Psychiatric: She has a normal mood and affect. Her behavior is normal. Judgment and thought content normal.    Current Medications:   Current Outpatient Prescriptions  Medication Sig Dispense Refill  . amLODipine (NORVASC) 5 MG tablet Take 5 mg by mouth every morning.     Marland Kitchen anastrozole (ARIMIDEX) 1 MG tablet Take 1 tablet (1 mg total) by mouth every morning. 90 tablet 3  . calcium carbonate (OS-CAL) 600 MG TABS tablet Take 600 mg by mouth 2 (two) times daily with a meal.    . Cholecalciferol (VITAMIN D-3) 1000 UNITS CAPS Take 1,000  Units by mouth 2 (two) times daily.    Marland Kitchen levothyroxine (SYNTHROID, LEVOTHROID) 112 MCG tablet Take 112 mcg by mouth daily before breakfast.    . omeprazole (PRILOSEC) 40 MG capsule Take 40 mg by mouth.     . polyethylene glycol (MIRALAX / GLYCOLAX) packet Take 17 g by mouth daily as needed for moderate constipation.     . pramipexole (MIRAPEX) 1 MG tablet Take 1.5 tablets (1.5 mg total) by mouth 3 (three) times daily. 410 tablet 3  . rasagiline (AZILECT) 1 MG TABS tablet Take 1 tablet (1 mg total) by mouth daily. 30 tablet 11  . naproxen sodium (ALEVE) 220 MG tablet Take 440 mg by mouth 2 (two) times daily as needed (Pain).     No current facility-administered medications for this visit.    Functional Status:   In your present state of health, do you have any difficulty performing the following activities: 04/12/2015  Hearing? N  Vision? Y  Difficulty concentrating or making decisions? N  Walking or climbing stairs? Y  Dressing or bathing? N  Doing errands, shopping? N  Preparing Food and eating ? N  Using the Toilet? N  In the past six months, have you accidently leaked urine? N  Do you have problems with loss of bowel control? N  Managing your Medications? N  Managing your Finances? N  Housekeeping or managing your Housekeeping? N    Fall/Depression Screening:    Fall Risk  04/12/2015 05/03/2014  Falls in the past year? Yes No  Number falls in past yr: 1 -  Injury with Fall? No -  Risk Factor Category  High Fall Risk -  Risk for fall due to : History of fall(s) -  Follow up Falls evaluation completed -   PHQ 2/9 Scores 04/12/2015  PHQ - 2 Score 2  PHQ- 9 Score 5    Assessment:   HTN-delivered BP cuff, reviewed use of new cuff  Plan:  [facility] CM Care Plan Problem One        Most Recent Value   Care Plan Problem One  High blood pressure   Role Documenting the Problem One  Care Management Coordinator   Care Plan for Problem One  Active   THN Long Term Goal (31-90 days)   Patient will obtain an accurate blood pressure monitor over the next  60 days   Interventions for Problem One Long Term Goal  RNCM delivered new BP monitor, demonstrated how to use, had patient demonstrate back to [facility] how to use. Reminded patient to record BP readings in log. Gave 2017 THN calendar   THN CM Short Term Goal #1 (0-30 days)  Patient will monitor BP and write in blue THN calendar   [facility] CM Short Term Goal #1 Start Date  04/12/15   [facility] CM Short Term Goal #1 Met Date  05/29/15   Interventions for Short Term Goal #1  Verified patient logging BP readings, gave 2017 calendar     Visit again in January and discharge  Patient will continue to monitor BP and report any low or high readings to her doctor.  Royetta Crochet. Laymond Purser, RN, BSN, [place] [phone]

## 2015-05-30 DIAGNOSIS — M5441 Lumbago with sciatica, right side: Secondary | ICD-10-CM | POA: Diagnosis not present

## 2015-05-30 DIAGNOSIS — M47816 Spondylosis without myelopathy or radiculopathy, lumbar region: Secondary | ICD-10-CM | POA: Diagnosis not present

## 2015-05-30 DIAGNOSIS — M9903 Segmental and somatic dysfunction of lumbar region: Secondary | ICD-10-CM | POA: Diagnosis not present

## 2015-05-30 DIAGNOSIS — S338XXA Sprain of other parts of lumbar spine and pelvis, initial encounter: Secondary | ICD-10-CM | POA: Diagnosis not present

## 2015-06-06 DIAGNOSIS — M9903 Segmental and somatic dysfunction of lumbar region: Secondary | ICD-10-CM | POA: Diagnosis not present

## 2015-06-06 DIAGNOSIS — M47816 Spondylosis without myelopathy or radiculopathy, lumbar region: Secondary | ICD-10-CM | POA: Diagnosis not present

## 2015-06-06 DIAGNOSIS — M5441 Lumbago with sciatica, right side: Secondary | ICD-10-CM | POA: Diagnosis not present

## 2015-06-06 DIAGNOSIS — S338XXA Sprain of other parts of lumbar spine and pelvis, initial encounter: Secondary | ICD-10-CM | POA: Diagnosis not present

## 2015-06-13 DIAGNOSIS — M9903 Segmental and somatic dysfunction of lumbar region: Secondary | ICD-10-CM | POA: Diagnosis not present

## 2015-06-13 DIAGNOSIS — S338XXA Sprain of other parts of lumbar spine and pelvis, initial encounter: Secondary | ICD-10-CM | POA: Diagnosis not present

## 2015-06-13 DIAGNOSIS — M5441 Lumbago with sciatica, right side: Secondary | ICD-10-CM | POA: Diagnosis not present

## 2015-06-13 DIAGNOSIS — M47816 Spondylosis without myelopathy or radiculopathy, lumbar region: Secondary | ICD-10-CM | POA: Diagnosis not present

## 2015-06-20 DIAGNOSIS — S338XXA Sprain of other parts of lumbar spine and pelvis, initial encounter: Secondary | ICD-10-CM | POA: Diagnosis not present

## 2015-06-20 DIAGNOSIS — M9903 Segmental and somatic dysfunction of lumbar region: Secondary | ICD-10-CM | POA: Diagnosis not present

## 2015-06-20 DIAGNOSIS — M47816 Spondylosis without myelopathy or radiculopathy, lumbar region: Secondary | ICD-10-CM | POA: Diagnosis not present

## 2015-06-20 DIAGNOSIS — M5441 Lumbago with sciatica, right side: Secondary | ICD-10-CM | POA: Diagnosis not present

## 2015-06-26 ENCOUNTER — Other Ambulatory Visit: Payer: Self-pay | Admitting: *Deleted

## 2015-06-26 ENCOUNTER — Encounter: Payer: Self-pay | Admitting: *Deleted

## 2015-06-26 NOTE — Patient Outreach (Signed)
Discharge telephone call  Call to patient home. Spoke with patient. Patient verifies she is checking BP daily. She is logging BP readings. She was able to verbalize her recorded readings.  06/18/15--144/76, 06/19/15--145/78,  06/20/15--145/92, 06/21/15--145/84,  06/22/15--136/90, 1/617--144/81,  06/24/15--142/96, 06/25/15--144/87,  06/26/15--152/88 Patient states most are taken before she has taken her BP medication.   Patient still reports pain in legs and hips especially in right, has a few more chiropractor sessions. She agrees to see ortho if pain not decreased, as patient would like to resume a more active lifestyle. Patient agrees to discharge plan  Assessment: HTN-instructed patient to record if BP  PAIN-patient will seek referral to ortho if pain continues after she has completed chiropractor sessions  Plan: Patient will include on her BP log if she has taken BP medication. Also, will check an afternoon BP weekly after she has taken medication. Patient will take log to MD appointment Patient will finish her Chiropractor visits and if pain is not decreased will get referral for ortho appointment to further assess pain in legs and hips. Patient agrees to case closure from [facility] program services.  RNCM will close case per protocol RNCM will fax MD closure letter Royetta Crochet. Laymond Purser, RN, BSN, [place] [phone]

## 2015-07-04 DIAGNOSIS — M9903 Segmental and somatic dysfunction of lumbar region: Secondary | ICD-10-CM | POA: Diagnosis not present

## 2015-07-04 DIAGNOSIS — M47816 Spondylosis without myelopathy or radiculopathy, lumbar region: Secondary | ICD-10-CM | POA: Diagnosis not present

## 2015-07-04 DIAGNOSIS — S338XXA Sprain of other parts of lumbar spine and pelvis, initial encounter: Secondary | ICD-10-CM | POA: Diagnosis not present

## 2015-07-04 DIAGNOSIS — M5441 Lumbago with sciatica, right side: Secondary | ICD-10-CM | POA: Diagnosis not present

## 2015-07-12 DIAGNOSIS — M5441 Lumbago with sciatica, right side: Secondary | ICD-10-CM | POA: Diagnosis not present

## 2015-07-12 DIAGNOSIS — S338XXA Sprain of other parts of lumbar spine and pelvis, initial encounter: Secondary | ICD-10-CM | POA: Diagnosis not present

## 2015-07-12 DIAGNOSIS — M47816 Spondylosis without myelopathy or radiculopathy, lumbar region: Secondary | ICD-10-CM | POA: Diagnosis not present

## 2015-07-12 DIAGNOSIS — M9903 Segmental and somatic dysfunction of lumbar region: Secondary | ICD-10-CM | POA: Diagnosis not present

## 2015-07-14 DIAGNOSIS — M5441 Lumbago with sciatica, right side: Secondary | ICD-10-CM | POA: Diagnosis not present

## 2015-07-14 DIAGNOSIS — M9903 Segmental and somatic dysfunction of lumbar region: Secondary | ICD-10-CM | POA: Diagnosis not present

## 2015-07-14 DIAGNOSIS — M47816 Spondylosis without myelopathy or radiculopathy, lumbar region: Secondary | ICD-10-CM | POA: Diagnosis not present

## 2015-07-14 DIAGNOSIS — S338XXA Sprain of other parts of lumbar spine and pelvis, initial encounter: Secondary | ICD-10-CM | POA: Diagnosis not present

## 2015-07-17 DIAGNOSIS — M25561 Pain in right knee: Secondary | ICD-10-CM | POA: Diagnosis not present

## 2015-07-17 DIAGNOSIS — M25562 Pain in left knee: Secondary | ICD-10-CM | POA: Diagnosis not present

## 2015-07-18 DIAGNOSIS — M9903 Segmental and somatic dysfunction of lumbar region: Secondary | ICD-10-CM | POA: Diagnosis not present

## 2015-07-18 DIAGNOSIS — M47816 Spondylosis without myelopathy or radiculopathy, lumbar region: Secondary | ICD-10-CM | POA: Diagnosis not present

## 2015-07-18 DIAGNOSIS — M5441 Lumbago with sciatica, right side: Secondary | ICD-10-CM | POA: Diagnosis not present

## 2015-07-18 DIAGNOSIS — S338XXA Sprain of other parts of lumbar spine and pelvis, initial encounter: Secondary | ICD-10-CM | POA: Diagnosis not present

## 2015-07-20 DIAGNOSIS — M9903 Segmental and somatic dysfunction of lumbar region: Secondary | ICD-10-CM | POA: Diagnosis not present

## 2015-07-20 DIAGNOSIS — S338XXA Sprain of other parts of lumbar spine and pelvis, initial encounter: Secondary | ICD-10-CM | POA: Diagnosis not present

## 2015-07-20 DIAGNOSIS — M47816 Spondylosis without myelopathy or radiculopathy, lumbar region: Secondary | ICD-10-CM | POA: Diagnosis not present

## 2015-07-20 DIAGNOSIS — M5441 Lumbago with sciatica, right side: Secondary | ICD-10-CM | POA: Diagnosis not present

## 2015-07-24 DIAGNOSIS — S338XXA Sprain of other parts of lumbar spine and pelvis, initial encounter: Secondary | ICD-10-CM | POA: Diagnosis not present

## 2015-07-24 DIAGNOSIS — M47816 Spondylosis without myelopathy or radiculopathy, lumbar region: Secondary | ICD-10-CM | POA: Diagnosis not present

## 2015-07-24 DIAGNOSIS — M9903 Segmental and somatic dysfunction of lumbar region: Secondary | ICD-10-CM | POA: Diagnosis not present

## 2015-07-24 DIAGNOSIS — M5441 Lumbago with sciatica, right side: Secondary | ICD-10-CM | POA: Diagnosis not present

## 2015-07-25 DIAGNOSIS — M25562 Pain in left knee: Secondary | ICD-10-CM | POA: Diagnosis not present

## 2015-07-25 DIAGNOSIS — M1711 Unilateral primary osteoarthritis, right knee: Secondary | ICD-10-CM | POA: Diagnosis not present

## 2015-07-27 DIAGNOSIS — M47816 Spondylosis without myelopathy or radiculopathy, lumbar region: Secondary | ICD-10-CM | POA: Diagnosis not present

## 2015-07-27 DIAGNOSIS — M9903 Segmental and somatic dysfunction of lumbar region: Secondary | ICD-10-CM | POA: Diagnosis not present

## 2015-07-27 DIAGNOSIS — M5441 Lumbago with sciatica, right side: Secondary | ICD-10-CM | POA: Diagnosis not present

## 2015-07-27 DIAGNOSIS — S338XXA Sprain of other parts of lumbar spine and pelvis, initial encounter: Secondary | ICD-10-CM | POA: Diagnosis not present

## 2015-08-03 DIAGNOSIS — M5441 Lumbago with sciatica, right side: Secondary | ICD-10-CM | POA: Diagnosis not present

## 2015-08-03 DIAGNOSIS — M47816 Spondylosis without myelopathy or radiculopathy, lumbar region: Secondary | ICD-10-CM | POA: Diagnosis not present

## 2015-08-03 DIAGNOSIS — M9903 Segmental and somatic dysfunction of lumbar region: Secondary | ICD-10-CM | POA: Diagnosis not present

## 2015-08-03 DIAGNOSIS — S338XXA Sprain of other parts of lumbar spine and pelvis, initial encounter: Secondary | ICD-10-CM | POA: Diagnosis not present

## 2015-08-11 ENCOUNTER — Other Ambulatory Visit: Payer: Self-pay | Admitting: Neurology

## 2015-08-14 ENCOUNTER — Other Ambulatory Visit: Payer: Self-pay | Admitting: *Deleted

## 2015-08-14 MED ORDER — PRAMIPEXOLE DIHYDROCHLORIDE 1 MG PO TABS: 1.5000 mg | ORAL_TABLET | Freq: Three times a day (TID) | ORAL | Status: DC

## 2015-08-16 ENCOUNTER — Other Ambulatory Visit: Payer: Self-pay | Admitting: *Deleted

## 2015-08-16 MED ORDER — RASAGILINE MESYLATE 1 MG PO TABS: 1.0000 mg | ORAL_TABLET | Freq: Every day | ORAL | Status: DC

## 2015-08-17 DIAGNOSIS — M5441 Lumbago with sciatica, right side: Secondary | ICD-10-CM | POA: Diagnosis not present

## 2015-08-17 DIAGNOSIS — M9903 Segmental and somatic dysfunction of lumbar region: Secondary | ICD-10-CM | POA: Diagnosis not present

## 2015-08-17 DIAGNOSIS — M47816 Spondylosis without myelopathy or radiculopathy, lumbar region: Secondary | ICD-10-CM | POA: Diagnosis not present

## 2015-08-17 DIAGNOSIS — S338XXA Sprain of other parts of lumbar spine and pelvis, initial encounter: Secondary | ICD-10-CM | POA: Diagnosis not present

## 2015-08-31 DIAGNOSIS — M5441 Lumbago with sciatica, right side: Secondary | ICD-10-CM | POA: Diagnosis not present

## 2015-08-31 DIAGNOSIS — M9903 Segmental and somatic dysfunction of lumbar region: Secondary | ICD-10-CM | POA: Diagnosis not present

## 2015-08-31 DIAGNOSIS — S338XXA Sprain of other parts of lumbar spine and pelvis, initial encounter: Secondary | ICD-10-CM | POA: Diagnosis not present

## 2015-08-31 DIAGNOSIS — M47816 Spondylosis without myelopathy or radiculopathy, lumbar region: Secondary | ICD-10-CM | POA: Diagnosis not present

## 2015-09-28 DIAGNOSIS — M5441 Lumbago with sciatica, right side: Secondary | ICD-10-CM | POA: Diagnosis not present

## 2015-09-28 DIAGNOSIS — M47816 Spondylosis without myelopathy or radiculopathy, lumbar region: Secondary | ICD-10-CM | POA: Diagnosis not present

## 2015-09-28 DIAGNOSIS — M9903 Segmental and somatic dysfunction of lumbar region: Secondary | ICD-10-CM | POA: Diagnosis not present

## 2015-09-28 DIAGNOSIS — S338XXA Sprain of other parts of lumbar spine and pelvis, initial encounter: Secondary | ICD-10-CM | POA: Diagnosis not present

## 2015-10-05 DIAGNOSIS — E039 Hypothyroidism, unspecified: Secondary | ICD-10-CM | POA: Diagnosis not present

## 2015-10-05 DIAGNOSIS — I10 Essential (primary) hypertension: Secondary | ICD-10-CM | POA: Diagnosis not present

## 2015-10-05 DIAGNOSIS — K219 Gastro-esophageal reflux disease without esophagitis: Secondary | ICD-10-CM | POA: Diagnosis not present

## 2015-10-09 DIAGNOSIS — M170 Bilateral primary osteoarthritis of knee: Secondary | ICD-10-CM | POA: Diagnosis not present

## 2015-10-09 DIAGNOSIS — F411 Generalized anxiety disorder: Secondary | ICD-10-CM | POA: Diagnosis not present

## 2015-10-09 DIAGNOSIS — M1611 Unilateral primary osteoarthritis, right hip: Secondary | ICD-10-CM | POA: Diagnosis not present

## 2015-10-09 DIAGNOSIS — E782 Mixed hyperlipidemia: Secondary | ICD-10-CM | POA: Diagnosis not present

## 2015-10-09 DIAGNOSIS — E039 Hypothyroidism, unspecified: Secondary | ICD-10-CM | POA: Diagnosis not present

## 2015-10-09 DIAGNOSIS — G20 Parkinson's disease: Secondary | ICD-10-CM | POA: Diagnosis not present

## 2015-10-09 DIAGNOSIS — K210 Gastro-esophageal reflux disease with esophagitis: Secondary | ICD-10-CM | POA: Diagnosis not present

## 2015-10-09 DIAGNOSIS — I10 Essential (primary) hypertension: Secondary | ICD-10-CM | POA: Diagnosis not present

## 2015-10-26 DIAGNOSIS — M9903 Segmental and somatic dysfunction of lumbar region: Secondary | ICD-10-CM | POA: Diagnosis not present

## 2015-10-26 DIAGNOSIS — M47816 Spondylosis without myelopathy or radiculopathy, lumbar region: Secondary | ICD-10-CM | POA: Diagnosis not present

## 2015-10-26 DIAGNOSIS — S338XXA Sprain of other parts of lumbar spine and pelvis, initial encounter: Secondary | ICD-10-CM | POA: Diagnosis not present

## 2015-10-26 DIAGNOSIS — M5441 Lumbago with sciatica, right side: Secondary | ICD-10-CM | POA: Diagnosis not present

## 2015-10-31 DIAGNOSIS — M47816 Spondylosis without myelopathy or radiculopathy, lumbar region: Secondary | ICD-10-CM | POA: Diagnosis not present

## 2015-10-31 DIAGNOSIS — M5441 Lumbago with sciatica, right side: Secondary | ICD-10-CM | POA: Diagnosis not present

## 2015-10-31 DIAGNOSIS — M9903 Segmental and somatic dysfunction of lumbar region: Secondary | ICD-10-CM | POA: Diagnosis not present

## 2015-10-31 DIAGNOSIS — S338XXA Sprain of other parts of lumbar spine and pelvis, initial encounter: Secondary | ICD-10-CM | POA: Diagnosis not present

## 2015-11-02 ENCOUNTER — Encounter (HOSPITAL_COMMUNITY): Payer: Self-pay | Admitting: Hematology & Oncology

## 2015-11-02 ENCOUNTER — Telehealth (HOSPITAL_COMMUNITY): Payer: Self-pay | Admitting: *Deleted

## 2015-11-02 ENCOUNTER — Encounter (HOSPITAL_COMMUNITY): Payer: Medicare Other | Attending: Hematology & Oncology | Admitting: Hematology & Oncology

## 2015-11-02 VITALS — BP 179/69 | HR 64 | Temp 98.3°F | Resp 18 | Ht 62.0 in | Wt 174.2 lb

## 2015-11-02 DIAGNOSIS — M9903 Segmental and somatic dysfunction of lumbar region: Secondary | ICD-10-CM | POA: Diagnosis not present

## 2015-11-02 DIAGNOSIS — D72819 Decreased white blood cell count, unspecified: Secondary | ICD-10-CM

## 2015-11-02 DIAGNOSIS — S338XXA Sprain of other parts of lumbar spine and pelvis, initial encounter: Secondary | ICD-10-CM | POA: Diagnosis not present

## 2015-11-02 DIAGNOSIS — C50912 Malignant neoplasm of unspecified site of left female breast: Secondary | ICD-10-CM | POA: Diagnosis not present

## 2015-11-02 DIAGNOSIS — C50412 Malignant neoplasm of upper-outer quadrant of left female breast: Secondary | ICD-10-CM

## 2015-11-02 DIAGNOSIS — M47816 Spondylosis without myelopathy or radiculopathy, lumbar region: Secondary | ICD-10-CM | POA: Diagnosis not present

## 2015-11-02 DIAGNOSIS — M5441 Lumbago with sciatica, right side: Secondary | ICD-10-CM | POA: Diagnosis not present

## 2015-11-02 DIAGNOSIS — Z170 Estrogen receptor positive status [ER+]: Secondary | ICD-10-CM

## 2015-11-02 DIAGNOSIS — Z79899 Other long term (current) drug therapy: Secondary | ICD-10-CM

## 2015-11-02 NOTE — Progress Notes (Signed)
Lisa Hilding, MD [place] / [place] [address]   DIAGNOSIS: Stage I Left breast cancer diagnosed 01/07/2012 ER/PR positive S/p lumpectomy, sentinel node biopsy XRT Adjuvant Arimidex Chronic Leukopenia History of BMBX 30% cellularity without MDS, or other primary Bone marrow disorder  CURRENT THERAPY: Arimidex   INTERVAL HISTORY: Lisa Torres 70 y.o. female returns for follow up of Stage I Left Breast cancer diagnosed in 2013, chronic leukopenia.   Lisa Torres is unaccompanied.   Complains of bad arthritis in her right knee. She sees a physician in [place] for this. She receives injections which help.  She has been taking her breast cancer pill. She notes that she does not miss a day.  She sees PCP Dr. Consuello Masse. He recently changed her thyroid medication so she will follow up with him again soon.   Describes her appetite as "too good".   Last mammogram was in August. She denies any problems with either breast. She notes no other concerns today.    MEDICAL HISTORY: Past Medical History  Diagnosis Date  . Breast cancer ([place]) 11/2011  . Radiation 04/2012    33 treatments for breast cancer  . Hypertension   . Thyroid disease   . GERD (gastroesophageal reflux disease)     has Hypothyroid; Hypertension; Parkinson disease ([place]); and Difficulty walking on her problem list.     is allergic to hydrocodone and tramadol.  Current Outpatient Prescriptions on File Prior to Visit  Medication Sig Dispense Refill  . amLODipine (NORVASC) 5 MG tablet Take 5 mg by mouth every morning.     Marland Kitchen anastrozole (ARIMIDEX) 1 MG tablet Take 1 tablet (1 mg total) by mouth every morning. 90 tablet 3  . calcium carbonate (OS-CAL) 600 MG TABS tablet Take 600 mg by mouth 2 (two) times daily with a meal.    . Cholecalciferol (VITAMIN D-3) 1000 UNITS CAPS Take 1,000 Units by mouth 2 (two) times daily.    . naproxen sodium (ALEVE) 220 MG tablet Take 440 mg by mouth 2 (two) times daily  as needed (Pain).    . polyethylene glycol (MIRALAX / GLYCOLAX) packet Take 17 g by mouth daily as needed for moderate constipation.     . pramipexole (MIRAPEX) 1 MG tablet Take 1.5 tablets (1.5 mg total) by mouth 3 (three) times daily. 410 tablet 3  . rasagiline (AZILECT) 1 MG TABS tablet Take 1 tablet (1 mg total) by mouth daily. 30 tablet 11  . omeprazole (PRILOSEC) 40 MG capsule Take 40 mg by mouth. Reported on 11/02/2015     No current facility-administered medications on file prior to visit.     SURGICAL HISTORY: Past Surgical History  Procedure Laterality Date  . Breast lumpectomy Left 12/2011  . Abdominal hysterectomy      partial  . Cholecystectomy    . Tubal ligation    . Colonoscopy N/A 07/01/2013    Procedure: COLONOSCOPY;  Surgeon: Rogene Houston, MD;  Location: [facility];  Service: Endoscopy;  Laterality: N/A;  1030    SOCIAL HISTORY: Social History   Social History  . Marital Status: Married    Spouse Name: Lisa Torres  . Number of Children: 3  . Years of Education: 12 th   Occupational History  .      retired   Social History Main Topics  . Smoking status: Former Smoker -- 0.50 packs/day for 18 years    Types: Cigarettes    Quit date: 09/30/1998  .  Smokeless tobacco: Never Used  . Alcohol Use: No  . Drug Use: No  . Sexual Activity: Not on file   Other Topics Concern  . Not on file   Social History Narrative   Patient lives at home with her husband Lisa Torres).    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 Cancer Mother 103    colon cancer  . Cancer Father 75    throat cancer    Review of Systems  Constitutional: Negative for fever, chills and weight loss.  HENT: Negative for hearing loss.   Eyes: Negative for blurred vision and double vision.  Respiratory: Negative for cough.   Cardiovascular: Negative for chest pain and palpitations.  Gastrointestinal: Negative for  nausea, vomiting and abdominal pain.  Genitourinary: Negative for dysuria and hematuria.  Musculoskeletal: Positive for joint pain. Negative for falls.       Right knee arthritis Skin: Negative for rash.  Neurological: Positive for tremors and focal weakness. Negative for dizziness, tingling and headaches.  Psychiatric/Behavioral: Negative for depression.   14 point review of systems was performed and is negative except as detailed under history of present illness and above  PHYSICAL EXAMINATION  ECOG PERFORMANCE STATUS: 1 - Symptomatic but completely ambulatory  Filed Vitals:   11/02/15 1135  BP: 179/69  Pulse: 64  Temp: 98.3 F (36.8 C)  Resp: 18    Physical Exam  Constitutional: She is oriented to person, place, and time and well-developed, well-nourished, and in no distress. Wears glasses HENT:  Head: Normocephalic and atraumatic.  Nose: Nose normal.  Mouth/Throat: Oropharynx is clear and moist. No oropharyngeal exudate.  Eyes: Conjunctivae and EOM are normal. Pupils are equal, round, and reactive to light. Right eye exhibits no discharge. Left eye exhibits no discharge. No scleral icterus.  Neck: Normal range of motion. Neck supple. No tracheal deviation present. No thyromegaly present.  Cardiovascular: Normal rate, regular rhythm and normal heart sounds.  Exam reveals no gallop and no friction rub.   No murmur heard. Pulmonary/Chest: Effort normal and breath sounds normal. She has no wheezes. She has no rales.  Breast: Left breast post surgical changes in the axilla LOQ, otherwise unremarkable. Abdominal: Soft. Bowel sounds are normal. She exhibits no distension and no mass. There is no tenderness. There is no rebound and no guarding.  Musculoskeletal: Normal range of motion. No edema noted. Lymphadenopathy:    She has no cervical adenopathy.  Neurological: She is alert and oriented to person, place, and time. She has normal reflexes. No cranial nerve deficit. Gait  normal. Coordination normal.  Skin: Skin is warm and dry. No rash noted.  Psychiatric: Mood, memory, affect and judgment normal.  Nursing note and vitals reviewed.   LABORATORY DATA: I have reviewed the data as listed  CBC    Component Value Date/Time   WBC 3.2* 05/05/2015 1028   RBC 4.19 05/05/2015 1028   HGB 11.7* 05/05/2015 1028   HCT 36.2 05/05/2015 1028   PLT 237 05/05/2015 1028   MCV 86.4 05/05/2015 1028   MCH 27.9 05/05/2015 1028   MCHC 32.3 05/05/2015 1028   RDW 12.2 05/05/2015 1028   LYMPHSABS 0.9 05/05/2015 1028   MONOABS 0.2 05/05/2015 1028   EOSABS 0.2 05/05/2015 1028   BASOSABS 0.0 05/05/2015 1028   CMP     Component Value Date/Time   NA 139 05/05/2015 1028   K 3.8  05/05/2015 1028   CL 103 05/05/2015 1028   CO2 28 05/05/2015 1028   GLUCOSE 93 05/05/2015 1028   BUN 11 05/05/2015 1028   CREATININE 0.85 05/05/2015 1028   CALCIUM 9.5 05/05/2015 1028   PROT 7.7 05/05/2015 1028   ALBUMIN 4.2 05/05/2015 1028   AST 22 05/05/2015 1028   ALT 20 05/05/2015 1028   ALKPHOS 80 05/05/2015 1028   BILITOT 0.5 05/05/2015 1028   GFRNONAA >60 05/05/2015 1028   GFRAA >60 05/05/2015 1028     ASSESSMENT and THERAPY PLAN:  Stage I Left breast cancer diagnosed 01/07/2012 ER/PR positive S/p lumpectomy, sentinel node biopsy XRT Adjuvant Arimidex Chronic Leukopenia History of BMBX 30% cellularity without MDS, or other primary Bone marrow disorder DEXA 11/07/2014 with normal bone density  She will continue with her Arimidex. She has excellent tolerance. She is to continue on calcium and vitamin D daily. She is up to date on her DEXA and had normal bone density.  She is up to date on her mammography. She will be due again next August.   She has a chronic leukopenia that has been stable. It was evaluated in the past with BMBX.  She is up-to-date on screening colonoscopy. She will return again in 6 months with repeat laboratory studies, and office visit, and exam.   All  questions were answered. The patient knows to call the clinic with any problems, questions or concerns. We can certainly see the patient much sooner if necessary.  This document serves as a record of services personally performed by Ancil Linsey, MD. It was created on her behalf by Arlyce Harman, a trained medical scribe. The creation of this record is based on the scribe's personal observations and the provider's statements to them. This document has been checked and approved by the attending provider.  I have reviewed the above documentation for accuracy and completeness, and I agree with the above.  Kelby Fam. Sigrid Schwebach MD

## 2015-11-02 NOTE — Patient Instructions (Addendum)
[place] at [facility] Discharge Instructions  RECOMMENDATIONS MADE BY THE CONSULTANT AND ANY TEST RESULTS WILL BE SENT TO YOUR REFERRING PHYSICIAN.   Return in August for Diagnostic Mammogram  Return to see Dr. Whitney Muse in 6 months and CBC same day    Thank you for choosing [place] at [facility] to provide your oncology and hematology care.  To afford each patient quality time with our provider, please arrive at least 15 minutes before your scheduled appointment time.   Beginning January 23rd 2017 lab work for the [facility] will be done in the  Main lab at [facility] on 1st floor. If you have a lab appointment with the [facility] please come in thru the  Main Entrance and check in at the main information desk  You need to re-schedule your appointment should you arrive 10 or more minutes late.  We strive to give you quality time with our providers, and arriving late affects you and other patients whose appointments are after yours.  Also, if you no show three or more times for appointments you may be dismissed from the clinic at the providers discretion.     Again, thank you for choosing [facility].  Our hope is that these requests will decrease the amount of time that you wait before being seen by our physicians.       _____________________________________________________________  Should you have questions after your visit to [facility] please contact our office at [phone] between the hours of 8:30 a.m. and 4:30 p.m.  Voicemails left after 4:30 p.m. will not be returned until the following business day.  For prescription refill requests, have your pharmacy contact our office.         Resources For Cancer Patients and their Caregivers ? American Cancer Society: Can assist with transportation, wigs, general needs, runs Look Good Feel Better.        ([phone] 814-2339 ? Cancer Care: Provides  financial assistance, online support groups, medication/co-pay assistance.  1-800-813-HOPE (905) 303-2947) ? [facility] Assists [place] cancer patients and their families through emotional , educational and financial support.  9848858625 ? [place] DSS Where to apply for food stamps, Medicaid and utility assistance. 813-539-5458 ? [facility]: Transportation to medical appointments. 929-883-9419 ? Social Security Administration: May apply for disability if have a Stage IV cancer. 971-066-4196 269-879-3856 ? [employer], Disability and Transit Services: Assists with nutrition, care and transit needs. [place] Support Programs: @10RELATIVEDAYS @ > Cancer Support Group  2nd Tuesday of the month 1pm-2pm, Journey Room  > Creative Journey  3rd Tuesday of the month 1130am-1pm, Journey Room  > Look Good Feel Better  1st Wednesday of the month 10am-12 noon, Journey Room (Call [facility] to register [phone])

## 2015-11-06 ENCOUNTER — Telehealth (HOSPITAL_COMMUNITY): Payer: Self-pay | Admitting: *Deleted

## 2015-11-06 ENCOUNTER — Ambulatory Visit: Payer: Medicare Other | Admitting: Nurse Practitioner

## 2015-11-06 DIAGNOSIS — M47816 Spondylosis without myelopathy or radiculopathy, lumbar region: Secondary | ICD-10-CM | POA: Diagnosis not present

## 2015-11-06 DIAGNOSIS — M5441 Lumbago with sciatica, right side: Secondary | ICD-10-CM | POA: Diagnosis not present

## 2015-11-06 DIAGNOSIS — M9903 Segmental and somatic dysfunction of lumbar region: Secondary | ICD-10-CM | POA: Diagnosis not present

## 2015-11-06 DIAGNOSIS — S338XXA Sprain of other parts of lumbar spine and pelvis, initial encounter: Secondary | ICD-10-CM | POA: Diagnosis not present

## 2015-11-06 NOTE — Telephone Encounter (Signed)
Patient notified that she needs to f/u with her PCP regarding a sleeping medication.

## 2015-11-08 DIAGNOSIS — M1711 Unilateral primary osteoarthritis, right knee: Secondary | ICD-10-CM | POA: Diagnosis not present

## 2015-11-08 DIAGNOSIS — G4709 Other insomnia: Secondary | ICD-10-CM | POA: Diagnosis not present

## 2015-11-09 DIAGNOSIS — S338XXA Sprain of other parts of lumbar spine and pelvis, initial encounter: Secondary | ICD-10-CM | POA: Diagnosis not present

## 2015-11-09 DIAGNOSIS — M9903 Segmental and somatic dysfunction of lumbar region: Secondary | ICD-10-CM | POA: Diagnosis not present

## 2015-11-09 DIAGNOSIS — M47816 Spondylosis without myelopathy or radiculopathy, lumbar region: Secondary | ICD-10-CM | POA: Diagnosis not present

## 2015-11-09 DIAGNOSIS — M5441 Lumbago with sciatica, right side: Secondary | ICD-10-CM | POA: Diagnosis not present

## 2015-11-14 ENCOUNTER — Encounter: Payer: Self-pay | Admitting: Nurse Practitioner

## 2015-11-14 ENCOUNTER — Telehealth: Payer: Self-pay | Admitting: Nurse Practitioner

## 2015-11-14 ENCOUNTER — Ambulatory Visit (INDEPENDENT_AMBULATORY_CARE_PROVIDER_SITE_OTHER): Payer: Medicare Other | Admitting: Nurse Practitioner

## 2015-11-14 VITALS — BP 134/90 | HR 68 | Ht 62.0 in | Wt 174.8 lb

## 2015-11-14 DIAGNOSIS — R262 Difficulty in walking, not elsewhere classified: Secondary | ICD-10-CM

## 2015-11-14 DIAGNOSIS — G20 Parkinson's disease: Secondary | ICD-10-CM

## 2015-11-14 NOTE — Telephone Encounter (Signed)
Message For: OFFICE               Taken 30-MAY-17 at 12:42PM by JAK ------------------------------------------------------------  Mariann Barter                 CID  [id number]   Patient  SAME                  Pt's Dr  Hassell Done        Area Code  336  Phone#  [phone]  *  DOB  [birth year]      RE  5/30TH APPT AT 3:15PM * PLEASE CALL ASAP TO       VERIFY APPT HAS NOT BEEN CANCELLED                    Disp:Y/N  N  If Y = C/B If No Response In 61minutes  ============================================================  Called pt back to confirm appt, pt expressed understanding

## 2015-11-14 NOTE — Progress Notes (Signed)
 [facility]  PATIENT: Lisa Torres DOB: 12/02/1945   REASON FOR VISIT: Follow-up for Parkinson's disease, gait abnormality HISTORY FROM: Patient    HISTORY OF PRESENT ILLNESS:09/30/13:YY Lisa Torres is a 70 years old right-handed female,was referred by her primary care physician Dr. Paul Sasser for evaluation right hand tremor, change of her gait posture, consistent with Parkinson's Disease, . 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 Her gait has changes, become slower,.  She has no anosmia, when she gets up quickly, she has lightheadedness, she is taking ativan as needed for insomnia, she has excessive movement during her sleep.  She denies significant memory trouble, she was getting tapering dose of prednisone, 20 mg, 3 tablets 5 days followed by 2 tablets 5 days followed by 1 tablets for 5 days for unclear reasons  Her findings are consistent with idiopathic Parkinson's disease, She is now taking mirapex 1mg tid, it helped her right leg pain, gait difficulty, no significant side effect, MRI of the brain together, essentially normal, laboratory evaluation showed normal and negative B12, RPR, TSH, C-reactive protein, ESR, ANA, HIV  UPDATE April 15th 2016:YYShe is off balance occasionally, she is taking care of her brother, she drove to him daily, She is now taking mirapex 1.5mg tid, Azilect 1mg qam, complains of unsteady gait, urinary urgency.  UPDATE May 19th 2016:YYShe is with husband today, she complains mildly increased  bilateral hands tremor, she is still active, she drives, independent on daily activities, She has some urinary urgency, generalized weakness, we have reviewed MRI of cervical spine in Oct 22 2014, showed no significant foraminal or canal stenosis.  UPDATE 05/08/2015 CMMs. Goedde, 70-year-old female returns for follow-up. She has idiopathic Parkinson's disease. She has had one fall in the last 6 months. She does not use a cane. No tremor is evident on exam today. She continues to drive , is independent in activities of daily living. She is currently on Mirapex without any compulsive behaviors and on Azilect with good control of her Parkinson symptoms. She does chair exercises. She returns for reevaluation UPDATE 05/30/2017CM Lisa Torres, 70-year-old female returns for follow-up. She has a history of Parkinson's disease but denies any recent falls since last seen. She is currently not using a cane. No tremor on exam today she remains independent in activities of daily living. Has right knee pain and gets injections every 3 months. She continues to drive. She is on Mirapex without compulsive behaviors and  Azilect. She returns for reevaluation  REVIEW OF SYSTEMS: Full 14 system review of systems performed and notable only for those listed, all others are neg:  Constitutional: Fatigue  Cardiovascular: neg Ear/Nose/Throat: neg  Skin: neg Eyes: neg Respiratory: neg Gastroitestinal: neg  Hematology/Lymphatic: neg  Endocrine: neg Musculoskeletal: Knee pain, walking difficulty Allergy/Immunology: neg Neurological: neg Psychiatric: neg Sleep : Restless legs   ALLERGIES: Allergies  Allergen Reactions  . Hydrocodone   . Tramadol     Hallucinations, abd pain    HOME MEDICATIONS: Outpatient Prescriptions Prior to Visit  Medication Sig Dispense Refill  . amLODipine (NORVASC) 5 MG tablet Take 5 mg by mouth every morning.     .   anastrozole (ARIMIDEX) 1 MG tablet Take 1 tablet (1 mg total) by mouth  every morning. 90 tablet 3  . calcium carbonate (OS-CAL) 600 MG TABS tablet Take 600 mg by mouth 2 (two) times daily with a meal.    . Cholecalciferol (VITAMIN D-3) 1000 UNITS CAPS Take 1,000 Units by mouth 2 (two) times daily.    . levothyroxine (SYNTHROID, LEVOTHROID) 100 MCG tablet Take 100 mcg by mouth daily before breakfast.    . meloxicam (MOBIC) 15 MG tablet Take 15 mg by mouth daily.    . naproxen sodium (ALEVE) 220 MG tablet Take 440 mg by mouth 2 (two) times daily as needed (Pain).    . polyethylene glycol (MIRALAX / GLYCOLAX) packet Take 17 g by mouth daily as needed for moderate constipation.     . pramipexole (MIRAPEX) 1 MG tablet Take 1.5 tablets (1.5 mg total) by mouth 3 (three) times daily. 410 tablet 3  . rasagiline (AZILECT) 1 MG TABS tablet Take 1 tablet (1 mg total) by mouth daily. 30 tablet 11  . omeprazole (PRILOSEC) 40 MG capsule Take 40 mg by mouth. Reported on 11/02/2015     No facility-administered medications prior to visit.    PAST MEDICAL HISTORY: Past Medical History  Diagnosis Date  . Breast cancer ([facility]) 11/2011  . Radiation 04/2012    33 treatments for breast cancer  . Hypertension   . Thyroid disease   . GERD (gastroesophageal reflux disease)     PAST SURGICAL HISTORY: Past Surgical History  Procedure Laterality Date  . Breast lumpectomy Left 12/2011  . Abdominal hysterectomy      partial  . Cholecystectomy    . Tubal ligation    . Colonoscopy N/A 07/01/2013    Procedure: COLONOSCOPY;  Surgeon: Najeeb U Rehman, MD;  [facility];  Service: Endoscopy;  Laterality: N/A;  1030    FAMILY HISTORY: Family History  Problem Relation Age of Onset  . Cancer Mother 73    colon cancer  . Cancer Father 75    throat cancer  . Pancreatic cancer Brother     63y 11-14-15    SOCIAL HISTORY: Social History   Social History  . Marital Status: Married    Spouse Name: Thomas  . Number of Children: 3  . Years of Education: 12 th   Occupational  History  .      retired   Social History Main Topics  . Smoking status: Former Smoker -- 0.50 packs/day for 18 years    Types: Cigarettes    Quit date: 09/30/1998  . Smokeless tobacco: Never Used  . Alcohol Use: No  . Drug Use: No  . Sexual Activity: Not on file   Other Topics Concern  . Not on file   Social History Narrative   Patient lives at home with her husband (Thomas).    Retired    High school education   Right handed     PHYSICAL EXAM  Filed Vitals:   11/14/15 1533  BP: 134/90  Pulse: 68  Height: 5' 2" (1.575 m)  Weight: 174 lb 12.8 oz (79.289 kg)   Body mass index is 31.96 kg/(m^2). Gen: NAD, conversant, well nourised, obese, well groomed  Cardiovascular: Regular rate rhythm, 1+ peripheral edema on the right Neck: Supple, no carotid bruit. NEUROLOGICAL EXAM:  MENTAL STATUS: Speech:  Speech is normal; fluent and spontaneous with normal comprehension.  Cognition:The patient is oriented to person, place, and time; recent and remote memory intact;    language fluent; normal attention, concentration, fund of knowledge.  CRANIAL NERVES: CN II: Visual fields are full to confrontation. Fundoscopic exam is normal with sharp discs and no vascular changes.  Pupils are  briskly reactive to light. . CN III, IV, VI: extraocular movement are normal. No ptosis. CN V: Facial sensation is intact to pinprick in all 3 divisions bilaterally. .  CN VII: Face is symmetric with normal eye closure and smile. CN VIII: Hearing is normal to rubbing fingers CN IX, X: Palate elevates symmetrically. Phonation is normal. CN XI: Head turning and shoulder shrug are intact CN XII: Tongue is midline with normal movements and no atrophy.  MOTOR: She has moderate right more than left limb and nuchal rigidity,mild bradykinesia REFLEXES: Reflexes are 3 and symmetric at the biceps, triceps, knees, and ankles. Plantar responses are flexor. SENSORY: Light touch,  pinprick, position sense, and vibration sense are intact in fingers and toes. COORDINATION: Rapid alternating movements and fine finger movements are intact. There is no dysmetria on finger-to-nose and heel-knee-shin. There are no abnormal or extraneous movements.  GAIT/STANCE: Can arise from the chair with arms crossed , cautious mildly unsteady, decreased arm swing. No assistive device DIAGNOSTIC DATA (LABS, IMAGING, TESTING) - I reviewed patient records, labs, notes, testing and imaging myself where available.  Lab Results  Component Value Date   WBC 3.2* 05/05/2015   HGB 11.7* 05/05/2015   HCT 36.2 05/05/2015   MCV 86.4 05/05/2015   PLT 237 05/05/2015      Component Value Date/Time   NA 139 05/05/2015 1028   K 3.8 05/05/2015 1028   CL 103 05/05/2015 1028   CO2 28 05/05/2015 1028   GLUCOSE 93 05/05/2015 1028   BUN 11 05/05/2015 1028   CREATININE 0.85 05/05/2015 1028   CALCIUM 9.5 05/05/2015 1028   PROT 7.7 05/05/2015 1028   ALBUMIN 4.2 05/05/2015 1028   AST 22 05/05/2015 1028   ALT 20 05/05/2015 1028   ALKPHOS 80 05/05/2015 1028   BILITOT 0.5 05/05/2015 1028   GFRNONAA >60 05/05/2015 1028   GFRAA >60 05/05/2015 1028    ASSESSMENT AND PLAN 69 y.o. year old female has a past medical history of ; Hypertension; and idiopathic Parkinson's disease here to follow-up. Mild gait abnormality  Continue Mirapex at current dose  Continue Azilect at current dose  Continue to do chair exercises If you feel unsteady with her gait use a cane Follow-up in 6 months Nancy Carolyn Martin, GNP, BC, APRN  [facility] [address], [address] , [place] [address] [phone]  

## 2015-11-14 NOTE — Patient Instructions (Signed)
Continue Mirapex at current dose  Continue Azilect at current dose  Continue to do chair exercises If you feel unsteady with her gait use a cane Follow up in 6 months

## 2015-11-15 DIAGNOSIS — S338XXA Sprain of other parts of lumbar spine and pelvis, initial encounter: Secondary | ICD-10-CM | POA: Diagnosis not present

## 2015-11-15 DIAGNOSIS — M9903 Segmental and somatic dysfunction of lumbar region: Secondary | ICD-10-CM | POA: Diagnosis not present

## 2015-11-15 DIAGNOSIS — M47816 Spondylosis without myelopathy or radiculopathy, lumbar region: Secondary | ICD-10-CM | POA: Diagnosis not present

## 2015-11-15 DIAGNOSIS — M5441 Lumbago with sciatica, right side: Secondary | ICD-10-CM | POA: Diagnosis not present

## 2015-11-15 NOTE — Progress Notes (Signed)
I have reviewed and agreed above plan. 

## 2015-11-20 DIAGNOSIS — M47816 Spondylosis without myelopathy or radiculopathy, lumbar region: Secondary | ICD-10-CM | POA: Diagnosis not present

## 2015-11-20 DIAGNOSIS — E6609 Other obesity due to excess calories: Secondary | ICD-10-CM | POA: Diagnosis not present

## 2015-11-20 DIAGNOSIS — M5441 Lumbago with sciatica, right side: Secondary | ICD-10-CM | POA: Diagnosis not present

## 2015-11-20 DIAGNOSIS — S338XXA Sprain of other parts of lumbar spine and pelvis, initial encounter: Secondary | ICD-10-CM | POA: Diagnosis not present

## 2015-11-20 DIAGNOSIS — E039 Hypothyroidism, unspecified: Secondary | ICD-10-CM | POA: Diagnosis not present

## 2015-11-20 DIAGNOSIS — M9903 Segmental and somatic dysfunction of lumbar region: Secondary | ICD-10-CM | POA: Diagnosis not present

## 2015-11-22 DIAGNOSIS — M1712 Unilateral primary osteoarthritis, left knee: Secondary | ICD-10-CM | POA: Diagnosis not present

## 2015-11-22 DIAGNOSIS — M1711 Unilateral primary osteoarthritis, right knee: Secondary | ICD-10-CM | POA: Diagnosis not present

## 2015-11-23 DIAGNOSIS — M47816 Spondylosis without myelopathy or radiculopathy, lumbar region: Secondary | ICD-10-CM | POA: Diagnosis not present

## 2015-11-23 DIAGNOSIS — S338XXA Sprain of other parts of lumbar spine and pelvis, initial encounter: Secondary | ICD-10-CM | POA: Diagnosis not present

## 2015-11-23 DIAGNOSIS — M5441 Lumbago with sciatica, right side: Secondary | ICD-10-CM | POA: Diagnosis not present

## 2015-11-23 DIAGNOSIS — M9903 Segmental and somatic dysfunction of lumbar region: Secondary | ICD-10-CM | POA: Diagnosis not present

## 2015-11-30 ENCOUNTER — Other Ambulatory Visit: Payer: Self-pay

## 2015-11-30 DIAGNOSIS — M47816 Spondylosis without myelopathy or radiculopathy, lumbar region: Secondary | ICD-10-CM | POA: Diagnosis not present

## 2015-11-30 DIAGNOSIS — M9903 Segmental and somatic dysfunction of lumbar region: Secondary | ICD-10-CM | POA: Diagnosis not present

## 2015-11-30 DIAGNOSIS — M5441 Lumbago with sciatica, right side: Secondary | ICD-10-CM | POA: Diagnosis not present

## 2015-11-30 DIAGNOSIS — S338XXA Sprain of other parts of lumbar spine and pelvis, initial encounter: Secondary | ICD-10-CM | POA: Diagnosis not present

## 2015-11-30 MED ORDER — PRAMIPEXOLE DIHYDROCHLORIDE 1 MG PO TABS: 1.5000 mg | ORAL_TABLET | Freq: Three times a day (TID) | ORAL | Status: DC

## 2015-11-30 NOTE — Telephone Encounter (Signed)
Received faxed request from [facility] for rx renewal. Pramipexole refills sent in x 1 yr.

## 2015-12-11 DIAGNOSIS — L258 Unspecified contact dermatitis due to other agents: Secondary | ICD-10-CM | POA: Diagnosis not present

## 2015-12-14 DIAGNOSIS — M9903 Segmental and somatic dysfunction of lumbar region: Secondary | ICD-10-CM | POA: Diagnosis not present

## 2015-12-14 DIAGNOSIS — S338XXA Sprain of other parts of lumbar spine and pelvis, initial encounter: Secondary | ICD-10-CM | POA: Diagnosis not present

## 2015-12-14 DIAGNOSIS — M47816 Spondylosis without myelopathy or radiculopathy, lumbar region: Secondary | ICD-10-CM | POA: Diagnosis not present

## 2015-12-14 DIAGNOSIS — M5441 Lumbago with sciatica, right side: Secondary | ICD-10-CM | POA: Diagnosis not present

## 2016-01-16 ENCOUNTER — Other Ambulatory Visit (HOSPITAL_COMMUNITY): Payer: Self-pay | Admitting: Hematology & Oncology

## 2016-01-16 DIAGNOSIS — Z9889 Other specified postprocedural states: Secondary | ICD-10-CM

## 2016-01-17 ENCOUNTER — Other Ambulatory Visit (HOSPITAL_COMMUNITY): Payer: Self-pay | Admitting: Hematology & Oncology

## 2016-01-17 DIAGNOSIS — Z9889 Other specified postprocedural states: Secondary | ICD-10-CM

## 2016-01-17 DIAGNOSIS — Z09 Encounter for follow-up examination after completed treatment for conditions other than malignant neoplasm: Secondary | ICD-10-CM

## 2016-01-17 DIAGNOSIS — Z853 Personal history of malignant neoplasm of breast: Secondary | ICD-10-CM

## 2016-01-23 ENCOUNTER — Encounter (HOSPITAL_COMMUNITY): Payer: BC Managed Care – PPO

## 2016-01-23 ENCOUNTER — Ambulatory Visit (HOSPITAL_COMMUNITY)
Admission: RE | Admit: 2016-01-23 | Discharge: 2016-01-23 | Disposition: A | Discharge status: Home / Self Care | Payer: Medicare Other | Source: Ambulatory Visit | Attending: Hematology & Oncology | Admitting: Hematology & Oncology

## 2016-01-23 DIAGNOSIS — Z853 Personal history of malignant neoplasm of breast: Secondary | ICD-10-CM | POA: Insufficient documentation

## 2016-01-23 DIAGNOSIS — R928 Other abnormal and inconclusive findings on diagnostic imaging of breast: Secondary | ICD-10-CM | POA: Diagnosis not present

## 2016-03-25 DIAGNOSIS — M25561 Pain in right knee: Secondary | ICD-10-CM | POA: Diagnosis not present

## 2016-03-25 DIAGNOSIS — M25562 Pain in left knee: Secondary | ICD-10-CM | POA: Diagnosis not present

## 2016-03-25 DIAGNOSIS — M1712 Unilateral primary osteoarthritis, left knee: Secondary | ICD-10-CM | POA: Diagnosis not present

## 2016-03-25 DIAGNOSIS — M1711 Unilateral primary osteoarthritis, right knee: Secondary | ICD-10-CM | POA: Diagnosis not present

## 2016-03-27 ENCOUNTER — Telehealth: Payer: Self-pay | Admitting: Neurology

## 2016-03-27 NOTE — Telephone Encounter (Addendum)
Patient has been having problems with painful muscle spasms in her bilateral lower extremities.  She would like to have this evaluated and treated.  She has been placed on Dr. Rhea Belton schedule on 03/28/16.

## 2016-03-27 NOTE — Telephone Encounter (Signed)
Pt called in after being seen by her Orthopaedic Physician, Dr. CaseLovie Macadamia)  . They have prescribed a muscle relaxer for her, however ; the pharmacist is asking the pt to see if it is ok to take with her other medication. There is a possible reaction. Please call and advise [phone]

## 2016-03-28 ENCOUNTER — Encounter: Payer: Self-pay | Admitting: Neurology

## 2016-03-28 ENCOUNTER — Ambulatory Visit (INDEPENDENT_AMBULATORY_CARE_PROVIDER_SITE_OTHER): Payer: Medicare Other | Admitting: Neurology

## 2016-03-28 VITALS — BP 160/86 | HR 66 | Resp 18 | Ht 62.0 in | Wt 172.0 lb

## 2016-03-28 DIAGNOSIS — M545 Low back pain, unspecified: Secondary | ICD-10-CM

## 2016-03-28 DIAGNOSIS — G8929 Other chronic pain: Secondary | ICD-10-CM | POA: Diagnosis not present

## 2016-03-28 DIAGNOSIS — R3915 Urgency of urination: Secondary | ICD-10-CM

## 2016-03-28 DIAGNOSIS — R262 Difficulty in walking, not elsewhere classified: Secondary | ICD-10-CM

## 2016-03-28 DIAGNOSIS — G20 Parkinson's disease: Secondary | ICD-10-CM

## 2016-03-28 MED ORDER — RASAGILINE MESYLATE 1 MG PO TABS: 1.0000 mg | ORAL_TABLET | Freq: Every day | ORAL | 4 refills | Status: DC

## 2016-03-28 NOTE — Progress Notes (Signed)
[facility]  PATIENT: Lisa Torres DOB: 03/09/1946   HISTORY OF PRESENT ILLNESS:  Lisa Torres is a 70 years old right-handed female,was referred by her primary care physician Dr. 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 Her gait has changes, become slower,.  She has no anosmia, when she gets up quickly, she has lightheadedness, she is taking ativan as needed for insomnia, she has excessive movement during her sleep.  She denies significant memory trouble, she was getting tapering dose of prednisone, 20 mg, 3 tablets 5 days followed by 2 tablets 5 days followed by 1 tablets for 5 days for unclear reasons  Her findings are consistent with idiopathic Parkinson's disease, She is now taking mirapex '1mg'$  tid, it helped her right leg pain, gait difficulty, no significant side effect, MRI of the brain together, essentially normal, laboratory evaluation showed normal and negative B12, RPR, TSH, C-reactive protein, ESR, ANA, HIV  She was started on Azilect [place] Mirapex 1.5 milligrams 3 times a day since diagnosis in April,  For her complains of urinary urgency, generalized weakness, MRI of cervical spine in Oct 22 2014, showed no significant foraminal or canal stenosis.  UPDATE Mar 28 2016: She complains of bilateral leg deep achy pain since August 2017, worsening urinary urgency, gait  abnormality, she is overall happy with her current Parkinson's medications  She is taking Azilect '1mg'$  daily, pramipexole 1.'5mg'$  tid at 8am, 12, 6pm. No significant side effect noticed   REVIEW OF SYSTEMS: Full 14 system review of systems performed and notable only for those listed, all others are neg:    ALLERGIES: Allergies  Allergen Reactions  . Hydrocodone   . Tramadol     Hallucinations, abd pain    HOME MEDICATIONS: Outpatient Medications Prior to Visit  Medication Sig Dispense Refill  . amLODipine (NORVASC) 5 MG tablet Take 5 mg by mouth every morning.     Marland Kitchen anastrozole (ARIMIDEX) 1 MG tablet Take 1 tablet (1 mg total) by mouth every morning. 90 tablet 3  . calcium carbonate (OS-CAL) 600 MG TABS tablet Take 600 mg by mouth 2 (two) times daily with a meal.    . Cholecalciferol (VITAMIN D-3) 1000 UNITS CAPS Take 1,000 Units by mouth 2 (two) times daily.    Marland Kitchen levothyroxine (SYNTHROID, LEVOTHROID) 100 MCG tablet Take 100 mcg by mouth daily before breakfast.    . meloxicam (MOBIC) 15 MG tablet Take 15 mg by mouth daily.    . naproxen sodium (ALEVE) 220 MG tablet Take 440 mg by mouth 2 (two) times daily as needed (Pain).    . polyethylene glycol (MIRALAX / GLYCOLAX) packet Take 17 g by mouth daily as needed for moderate constipation.     . pramipexole (MIRAPEX) 1 MG tablet Take 1.5 tablets (1.5 mg total) by mouth 3 (three) times daily. 405 tablet 3  . rasagiline (AZILECT) 1 MG TABS tablet Take 1 tablet (1 mg total) by mouth daily. 30 tablet 11  . traZODone (DESYREL) 50 MG  tablet TAKE 1 TABLET BY MOUT AT BEDTIME  6   No facility-administered medications prior to visit.     PAST MEDICAL HISTORY: Past Medical History:  Diagnosis Date  . Breast cancer ([place]) 11/2011  . GERD (gastroesophageal reflux disease)   . Hypertension   . Radiation 04/2012   33 treatments for breast cancer  . Thyroid disease     PAST SURGICAL HISTORY: Past Surgical History:  Procedure Laterality Date  .  ABDOMINAL HYSTERECTOMY     partial  . BREAST LUMPECTOMY Left 12/2011  . CHOLECYSTECTOMY    . COLONOSCOPY N/A 07/01/2013   Procedure: COLONOSCOPY;  Surgeon: Rogene Houston, MD;  [facility];  Service: Endoscopy;  Laterality: N/A;  1030  . TUBAL LIGATION      FAMILY HISTORY: Family History  Problem Relation Age of Onset  . Cancer Mother 35    colon cancer  . Cancer Father 75    throat cancer  . Pancreatic cancer Brother     63y 11-14-15    SOCIAL HISTORY: Social History   Social History  . Marital status: Married    Spouse name: Marcello Moores  . Number of children: 3  . Years of education: 69 th   Occupational History  .      retired   Social History Main Topics  . Smoking status: Former Smoker    Packs/day: 0.50    Years: 18.00    Types: Cigarettes    Quit date: 09/30/1998  . Smokeless tobacco: Never Used  . Alcohol use No  . Drug use: No  . Sexual activity: Not on file   Other Topics Concern  . Not on file   Social History Narrative   Patient lives at home with her husband Marcello Moores).    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   Right handed     PHYSICAL EXAM  Vitals:   03/28/16 1036  BP: (!) 160/86  Pulse: 66  Resp: 18  Weight: 172 lb (78 kg)  Height: '5\' 2"'$  (1.575 m)   Body mass index is 31.46 kg/m. Gen: NAD, conversant, well nourised, obese, well groomed  Cardiovascular: Regular rate rhythm, 1+ peripheral edema on the right Neck: Supple, no carotid bruit. NEUROLOGICAL EXAM:  MENTAL STATUS: Speech:  Speech is normal; fluent and spontaneous with normal comprehension.  Cognition:The patient is oriented to person, place, and time; recent and remote memory intact;  language fluent; normal attention, concentration, fund of knowledge.  CRANIAL NERVES: CN II: Visual fields are full to confrontation. Fundoscopic exam is normal with sharp discs and no vascular changes.  Pupils are  briskly reactive to light. . CN III, IV, VI:  extraocular movement are normal. No ptosis. CN V: Facial sensation is intact to pinprick in all 3 divisions bilaterally. .  CN VII: Face is symmetric with normal eye closure and smile. CN VIII: Hearing is normal to rubbing fingers CN IX, X: Palate elevates symmetrically. Phonation is normal. CN XI: Head turning and shoulder shrug are intact CN XII: Tongue is midline with normal movements and no atrophy.  MOTOR: She has moderate right more than left limb and nuchal rigidity,mild bradykinesia,She has mild bilateral toe flexion extension weakness REFLEXES: Reflexes are 3 and symmetric at the biceps, triceps, knees, and ankles. Plantar responses are flexor. SENSORY: Light touch, pinprick, position sense, and vibration sense are intact in fingers and toes. COORDINATION: Rapid alternating movements and fine finger movements are intact. There is no dysmetria on finger-to-nose and heel-knee-shin.  There are no abnormal or extraneous movements.  GAIT/STANCE: Need to push up to get off from seated position, stiff, cautious gait, small stride  DIAGNOSTIC DATA (LABS, IMAGING, TESTING) - I reviewed patient records, labs, notes, testing and imaging myself where available.  Lab Results  Component Value Date   WBC 3.2 (L) 05/05/2015   HGB 11.7 (L) 05/05/2015   HCT 36.2 05/05/2015   MCV 86.4 05/05/2015   PLT 237 05/05/2015      Component Value Date/Time   NA 139 05/05/2015 1028   K 3.8 05/05/2015 1028   CL 103 05/05/2015 1028   CO2 28 05/05/2015 1028   GLUCOSE 93 05/05/2015 1028   BUN 11 05/05/2015 1028   CREATININE 0.85 05/05/2015 1028   CALCIUM 9.5 05/05/2015 1028   PROT 7.7 05/05/2015 1028   ALBUMIN 4.2 05/05/2015 1028   AST 22 05/05/2015 1028   ALT 20 05/05/2015 1028   ALKPHOS 80 05/05/2015 1028   BILITOT 0.5 05/05/2015 1028   GFRNONAA >60 05/05/2015 1028   GFRAA >60 05/05/2015 1028    ASSESSMENT AND PLAN Idiopathic Parkinson's disease  Continue current medication of Azilect 1  mg daily, pramipexole 1.5 milligrams 3 times a day  Worsening low back pain, bilateral lower extremity deep achy pain, urinary urgency  She has hyperreflexia at bilateral upper and lower extremity, distal toe weakness, upper back pain on deep palpation  Previous MRI of cervical spine showed no significant abnormality, potential localization to thoracic spine versus lumbar spine  Proceed with MRI thoracic, and lumbar spine  Marcial Pacas, M.D. Ph.D.  [facility] [place], [place] [address] Phone: [phone] Fax:      [phone]

## 2016-04-04 DIAGNOSIS — K210 Gastro-esophageal reflux disease with esophagitis: Secondary | ICD-10-CM | POA: Diagnosis not present

## 2016-04-04 DIAGNOSIS — I10 Essential (primary) hypertension: Secondary | ICD-10-CM | POA: Diagnosis not present

## 2016-04-04 DIAGNOSIS — E039 Hypothyroidism, unspecified: Secondary | ICD-10-CM | POA: Diagnosis not present

## 2016-04-04 DIAGNOSIS — G20 Parkinson's disease: Secondary | ICD-10-CM | POA: Diagnosis not present

## 2016-04-04 DIAGNOSIS — E782 Mixed hyperlipidemia: Secondary | ICD-10-CM | POA: Diagnosis not present

## 2016-04-09 DIAGNOSIS — F411 Generalized anxiety disorder: Secondary | ICD-10-CM | POA: Diagnosis not present

## 2016-04-09 DIAGNOSIS — I10 Essential (primary) hypertension: Secondary | ICD-10-CM | POA: Diagnosis not present

## 2016-04-09 DIAGNOSIS — E039 Hypothyroidism, unspecified: Secondary | ICD-10-CM | POA: Diagnosis not present

## 2016-04-09 DIAGNOSIS — Z6832 Body mass index (BMI) 32.0-32.9, adult: Secondary | ICD-10-CM | POA: Diagnosis not present

## 2016-04-09 DIAGNOSIS — M1611 Unilateral primary osteoarthritis, right hip: Secondary | ICD-10-CM | POA: Diagnosis not present

## 2016-04-09 DIAGNOSIS — M170 Bilateral primary osteoarthritis of knee: Secondary | ICD-10-CM | POA: Diagnosis not present

## 2016-04-09 DIAGNOSIS — Z23 Encounter for immunization: Secondary | ICD-10-CM | POA: Diagnosis not present

## 2016-04-09 DIAGNOSIS — G20 Parkinson's disease: Secondary | ICD-10-CM | POA: Diagnosis not present

## 2016-04-11 ENCOUNTER — Other Ambulatory Visit: Payer: Medicare Other

## 2016-04-16 ENCOUNTER — Ambulatory Visit
Admission: RE | Admit: 2016-04-16 | Discharge: 2016-04-16 | Disposition: A | Discharge status: Home / Self Care | Payer: Medicare Other | Source: Ambulatory Visit | Attending: Neurology | Admitting: Neurology

## 2016-04-16 DIAGNOSIS — M545 Low back pain, unspecified: Secondary | ICD-10-CM

## 2016-04-16 DIAGNOSIS — G20 Parkinson's disease: Secondary | ICD-10-CM

## 2016-04-16 DIAGNOSIS — M546 Pain in thoracic spine: Secondary | ICD-10-CM | POA: Diagnosis not present

## 2016-04-16 DIAGNOSIS — R3915 Urgency of urination: Secondary | ICD-10-CM

## 2016-04-16 DIAGNOSIS — R262 Difficulty in walking, not elsewhere classified: Secondary | ICD-10-CM

## 2016-04-16 DIAGNOSIS — G8929 Other chronic pain: Secondary | ICD-10-CM

## 2016-04-16 DIAGNOSIS — G20A1 Parkinson's disease without dyskinesia, without mention of fluctuations: Secondary | ICD-10-CM

## 2016-05-06 ENCOUNTER — Encounter (HOSPITAL_COMMUNITY): Payer: Medicare Other | Attending: Hematology & Oncology | Admitting: Hematology & Oncology

## 2016-05-06 ENCOUNTER — Encounter (HOSPITAL_COMMUNITY): Payer: Self-pay | Admitting: Hematology & Oncology

## 2016-05-06 ENCOUNTER — Encounter (HOSPITAL_COMMUNITY): Payer: Medicare Other

## 2016-05-06 VITALS — BP 157/72 | HR 74 | Temp 98.1°F | Resp 18 | Wt 177.1 lb

## 2016-05-06 DIAGNOSIS — C50412 Malignant neoplasm of upper-outer quadrant of left female breast: Secondary | ICD-10-CM

## 2016-05-06 DIAGNOSIS — Z79899 Other long term (current) drug therapy: Secondary | ICD-10-CM | POA: Diagnosis not present

## 2016-05-06 DIAGNOSIS — K219 Gastro-esophageal reflux disease without esophagitis: Secondary | ICD-10-CM | POA: Insufficient documentation

## 2016-05-06 DIAGNOSIS — Z170 Estrogen receptor positive status [ER+]: Secondary | ICD-10-CM | POA: Diagnosis not present

## 2016-05-06 DIAGNOSIS — C50912 Malignant neoplasm of unspecified site of left female breast: Secondary | ICD-10-CM

## 2016-05-06 DIAGNOSIS — I10 Essential (primary) hypertension: Secondary | ICD-10-CM | POA: Insufficient documentation

## 2016-05-06 DIAGNOSIS — M25561 Pain in right knee: Secondary | ICD-10-CM | POA: Diagnosis not present

## 2016-05-06 DIAGNOSIS — D72819 Decreased white blood cell count, unspecified: Secondary | ICD-10-CM

## 2016-05-06 DIAGNOSIS — Z9889 Other specified postprocedural states: Secondary | ICD-10-CM | POA: Diagnosis not present

## 2016-05-06 DIAGNOSIS — Z923 Personal history of irradiation: Secondary | ICD-10-CM | POA: Insufficient documentation

## 2016-05-06 DIAGNOSIS — Z79811 Long term (current) use of aromatase inhibitors: Secondary | ICD-10-CM | POA: Diagnosis not present

## 2016-05-06 DIAGNOSIS — C50012 Malignant neoplasm of nipple and areola, left female breast: Secondary | ICD-10-CM

## 2016-05-06 DIAGNOSIS — E039 Hypothyroidism, unspecified: Secondary | ICD-10-CM | POA: Insufficient documentation

## 2016-05-06 DIAGNOSIS — Z853 Personal history of malignant neoplasm of breast: Secondary | ICD-10-CM | POA: Diagnosis not present

## 2016-05-06 DIAGNOSIS — R262 Difficulty in walking, not elsewhere classified: Secondary | ICD-10-CM | POA: Diagnosis not present

## 2016-05-06 DIAGNOSIS — Z9049 Acquired absence of other specified parts of digestive tract: Secondary | ICD-10-CM | POA: Diagnosis not present

## 2016-05-06 DIAGNOSIS — Z87891 Personal history of nicotine dependence: Secondary | ICD-10-CM | POA: Insufficient documentation

## 2016-05-06 DIAGNOSIS — G20 Parkinson's disease: Secondary | ICD-10-CM | POA: Insufficient documentation

## 2016-05-06 LAB — CBC WITH DIFFERENTIAL/PLATELET
BASOS ABS: 0 10*3/uL (ref 0.0–0.1)
Basophils Relative: 1 %
Eosinophils Absolute: 0.1 10*3/uL (ref 0.0–0.7)
Eosinophils Relative: 3 %
HEMATOCRIT: 36.5 % (ref 36.0–46.0)
Hemoglobin: 12 g/dL (ref 12.0–15.0)
LYMPHS ABS: 1 10*3/uL (ref 0.7–4.0)
LYMPHS PCT: 26 %
MCH: 28.7 pg (ref 26.0–34.0)
MCHC: 32.9 g/dL (ref 30.0–36.0)
MCV: 87.3 fL (ref 78.0–100.0)
Monocytes Absolute: 0.2 10*3/uL (ref 0.1–1.0)
Monocytes Relative: 4 %
NEUTROS ABS: 2.6 10*3/uL (ref 1.7–7.7)
Neutrophils Relative %: 66 %
Platelets: 223 10*3/uL (ref 150–400)
RBC: 4.18 MIL/uL (ref 3.87–5.11)
RDW: 12.7 % (ref 11.5–15.5)
WBC: 3.9 10*3/uL — AB (ref 4.0–10.5)

## 2016-05-06 NOTE — Progress Notes (Signed)
[name], MD [address] [place] [address]   DIAGNOSIS: Stage I Left breast cancer diagnosed 01/07/2012 ER/PR positive S/p lumpectomy, sentinel node biopsy XRT Adjuvant Arimidex Chronic Leukopenia History of BMBX 30% cellularity without MDS, or other primary Bone marrow disorder  CURRENT THERAPY: Arimidex   INTERVAL HISTORY: Lisa Torres 70 y.o. female returns for follow up of Stage I Left Breast cancer diagnosed in 2013, and chronic leukopenia.   Lisa Torres is unaccompanied. Last mammogram was in August. She takes her arimidex daily.   She is experiencing some right knee pain. She got some injections, but they don't last for very long. It is causing some difficulty walking.   She has been taking her calcium and vitamin D. She states that she tries to stay active, even though she has the knee pain. She knows that if she slows down that "it may be difficult to start back moving again."  She denies abdominal pain. No change in bowel or bladder habits.   She has received her flu shot.   MEDICAL HISTORY: Past Medical History:  Diagnosis Date  . Breast cancer ([place]) 11/2011  . GERD (gastroesophageal reflux disease)   . Hypertension   . Radiation 04/2012   33 treatments for breast cancer  . Thyroid disease     has Hypothyroid; Hypertension; Parkinson disease ([place]); Difficulty walking; and Breast cancer, left breast ([place]) on her problem list.     is allergic to hydrocodone and tramadol.  Current Outpatient Prescriptions on File Prior to Visit  Medication Sig Dispense Refill  . amLODipine (NORVASC) 5 MG tablet Take 5 mg by mouth every morning.     Marland Kitchen anastrozole (ARIMIDEX) 1 MG tablet Take 1 tablet (1 mg total) by mouth every morning. 90 tablet 3  . calcium carbonate (OS-CAL) 600 MG TABS tablet Take 600 mg by mouth 2 (two) times daily with a meal.    . Cholecalciferol (VITAMIN D-3) 1000 UNITS CAPS Take 1,000 Units by mouth 2 (two) times daily.    Marland Kitchen  levothyroxine (SYNTHROID, LEVOTHROID) 100 MCG tablet Take 100 mcg by mouth daily before breakfast.    . meloxicam (MOBIC) 15 MG tablet Take 15 mg by mouth daily.    . naproxen sodium (ALEVE) 220 MG tablet Take 440 mg by mouth 2 (two) times daily as needed (Pain).    . polyethylene glycol (MIRALAX / GLYCOLAX) packet Take 17 g by mouth daily as needed for moderate constipation.     . pramipexole (MIRAPEX) 1 MG tablet Take 1.5 tablets (1.5 mg total) by mouth 3 (three) times daily. 405 tablet 3  . rasagiline (AZILECT) 1 MG TABS tablet Take 1 tablet (1 mg total) by mouth daily. 90 tablet 4  . traZODone (DESYREL) 50 MG tablet TAKE 1 TABLET BY MOUT AT BEDTIME  6   No current facility-administered medications on file prior to visit.      SURGICAL HISTORY: Past Surgical History:  Procedure Laterality Date  . ABDOMINAL HYSTERECTOMY     partial  . BREAST LUMPECTOMY Left 12/2011  . CHOLECYSTECTOMY    . COLONOSCOPY N/A 07/01/2013   Procedure: COLONOSCOPY;  Surgeon: Rogene Houston, MD;  [facility];  Service: Endoscopy;  Laterality: N/A;  1030  . TUBAL LIGATION      SOCIAL HISTORY: Social History   Social History  . Marital status: Married    Spouse name: Marcello Moores  . Number of children: 3  . Years of education: 67  th   Occupational History  .      retired   Social History Main Topics  . Smoking status: Former Smoker    Packs/day: 0.50    Years: 18.00    Types: Cigarettes    Quit date: 09/30/1998  . Smokeless tobacco: Never Used  . Alcohol use No  . Drug use: No  . Sexual activity: Not on file   Other Topics Concern  . Not on file   Social History Narrative   Patient lives at home with her husband Marcello Moores).    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 Cancer Mother 27    colon cancer  . Cancer Father 75    throat cancer  . Pancreatic cancer Brother     63y 11-14-15    Review of  Systems  Constitutional: Negative.   HENT: Negative.   Eyes: Negative.   Respiratory: Negative.   Cardiovascular: Negative.   Gastrointestinal: Negative.  Negative for abdominal pain.  Genitourinary: Negative.   Musculoskeletal: Positive for joint pain (right knee).  Skin: Negative.   Neurological: Negative.   Endo/Heme/Allergies: Negative.   Psychiatric/Behavioral: Negative.   All other systems reviewed and are negative. 14 point review of systems was performed and is negative except as detailed under history of present illness and above  PHYSICAL EXAMINATION  ECOG PERFORMANCE STATUS: 1 - Symptomatic but completely ambulatory  Vitals:   05/06/16 1138  BP: (!) 157/72  Pulse: 74  Resp: 18  Temp: 98.1 F (36.7 C)     Physical Exam  Constitutional: She is oriented to person, place, and time and well-developed, well-nourished, and in no distress.  Wears glasses.  Patient was able to get on exam table with assistance.  Was wearing compression socks.   HENT:  Head: Normocephalic and atraumatic.  Eyes: EOM are normal. Pupils are equal, round, and reactive to light. Right eye exhibits no discharge. Left eye exhibits no discharge. No scleral icterus.  Neck: Normal range of motion. Neck supple.  Cardiovascular: Normal rate, regular rhythm and normal heart sounds.   Pulmonary/Chest: Effort normal and breath sounds normal. No respiratory distress.  Abdominal: Soft. Bowel sounds are normal. She exhibits no distension. There is no tenderness. There is no rebound.  Musculoskeletal: Normal range of motion. She exhibits no edema.  Lymphadenopathy:    She has no cervical adenopathy.    She has no axillary adenopathy.  Neurological: She is alert and oriented to person, place, and time. Gait normal.  Skin: Skin is warm and dry.  Nursing note and vitals reviewed.   LABORATORY DATA: I have reviewed the data as listed  CBC    Component Value Date/Time   WBC 3.9 (L) 05/06/2016 1125    RBC 4.18 05/06/2016 1125   HGB 12.0 05/06/2016 1125   HCT 36.5 05/06/2016 1125   PLT 223 05/06/2016 1125   MCV 87.3 05/06/2016 1125   MCH 28.7 05/06/2016 1125   MCHC 32.9 05/06/2016 1125   RDW 12.7 05/06/2016 1125   LYMPHSABS 1.0 05/06/2016 1125   MONOABS 0.2 05/06/2016 1125   EOSABS 0.1 05/06/2016 1125   BASOSABS 0.0 05/06/2016 1125   CMP     Component Value Date/Time   NA 139 05/05/2015 1028   K 3.8 05/05/2015 1028   CL 103 05/05/2015 1028   CO2 28 05/05/2015 1028   GLUCOSE 93 05/05/2015 1028   BUN  11 05/05/2015 1028   CREATININE 0.85 05/05/2015 1028   CALCIUM 9.5 05/05/2015 1028   PROT 7.7 05/05/2015 1028   ALBUMIN 4.2 05/05/2015 1028   AST 22 05/05/2015 1028   ALT 20 05/05/2015 1028   ALKPHOS 80 05/05/2015 1028   BILITOT 0.5 05/05/2015 1028   GFRNONAA >60 05/05/2015 1028   GFRAA >60 05/05/2015 1028     ASSESSMENT and THERAPY PLAN:  Stage I Left breast cancer diagnosed 01/07/2012 ER/PR positive S/p lumpectomy, sentinel node biopsy XRT Adjuvant Arimidex Chronic Leukopenia History of BMBX 30% cellularity without MDS, or other primary Bone marrow disorder DEXA 11/07/2014 with normal bone density  She will continue with her Arimidex. She has excellent tolerance. She is to continue on calcium and vitamin D daily. She is up to date on her DEXA and had normal bone density.  She is up to date on her mammography. She will be due again next August.   She has a chronic leukopenia that has been stable. It was evaluated in the past with BMBX. Labs were reviewed with the patient today. Results are noted above.   She is up-to-date on screening colonoscopy. She will return again in 6 months with repeat laboratory studies, and office visit, and exam.   Physical activity was encouraged.   All questions were answered. The patient knows to call the clinic with any problems, questions or concerns. We can certainly see the patient much sooner if necessary.  This document serves as a  record of services personally performed by Ancil Linsey, MD. It was created on her behalf by Martinique Casey, a trained medical scribe. The creation of this record is based on the scribe's personal observations and the provider's statements to them. This document has been checked and approved by the attending provider.  I have reviewed the above documentation for accuracy and completeness, and I agree with the above.  Kelby Fam. Leng Montesdeoca MD

## 2016-05-06 NOTE — Patient Instructions (Addendum)
[place] at [facility] Discharge Instructions  RECOMMENDATIONS MADE BY THE CONSULTANT AND ANY TEST RESULTS WILL BE SENT TO YOUR REFERRING PHYSICIAN.  Exam with Dr. Kavina Cantave Muse today. Return to [place] in six months as scheduled.      Thank you for choosing [place] at [facility] to provide your oncology and hematology care.  To afford each patient quality time with our provider, please arrive at least 15 minutes before your scheduled appointment time.   Beginning January 23rd 2017 lab work for the [facility] will be done in the  Main lab at [facility] on 1st floor. If you have a lab appointment with the [facility] please come in thru the  Main Entrance and check in at the main information desk  You need to re-schedule your appointment should you arrive 10 or more minutes late.  We strive to give you quality time with our providers, and arriving late affects you and other patients whose appointments are after yours.  Also, if you no show three or more times for appointments you may be dismissed from the clinic at the providers discretion.     Again, thank you for choosing [facility].  Our hope is that these requests will decrease the amount of time that you wait before being seen by our physicians.       _____________________________________________________________  Should you have questions after your visit to [facility], please contact our office at [phone] between the hours of 8:30 a.m. and 4:30 p.m.  Voicemails left after 4:30 p.m. will not be returned until the following business day.  For prescription refill requests, have your pharmacy contact our office.         Resources For Cancer Patients and their Caregivers ? American Cancer Society: Can assist with transportation, wigs, general needs, runs Look Good Feel Better.        539-235-7722 ? Cancer Care: Provides financial assistance, online  support groups, medication/co-pay assistance.  1-800-813-HOPE (814) 414-3489) ? [facility] Assists [place] cancer patients and their families through emotional , educational and financial support.  2508262789 ? [place] DSS Where to apply for food stamps, Medicaid and utility assistance. (940) 167-5523 ? [facility]: Transportation to medical appointments. 219-828-0618 ? Social Security Administration: May apply for disability if have a Stage IV cancer. (401)261-3264 408-094-9806 ? [employer], Disability and Transit Services: Assists with nutrition, care and transit needs. [place] Support Programs: @10RELATIVEDAYS @ > Cancer Support Group  2nd Tuesday of the month 1pm-2pm, Journey Room  > Creative Journey  3rd Tuesday of the month 1130am-1pm, Journey Room  > Look Good Feel Better  1st Wednesday of the month 10am-12 noon, Journey Room (Call [place] to register [phone])

## 2016-05-16 ENCOUNTER — Ambulatory Visit: Payer: Medicare Other | Admitting: Nurse Practitioner

## 2016-05-23 DIAGNOSIS — M25562 Pain in left knee: Secondary | ICD-10-CM | POA: Diagnosis not present

## 2016-05-23 DIAGNOSIS — M1711 Unilateral primary osteoarthritis, right knee: Secondary | ICD-10-CM | POA: Diagnosis not present

## 2016-05-23 DIAGNOSIS — M25561 Pain in right knee: Secondary | ICD-10-CM | POA: Diagnosis not present

## 2016-05-23 DIAGNOSIS — M1712 Unilateral primary osteoarthritis, left knee: Secondary | ICD-10-CM | POA: Diagnosis not present

## 2016-05-26 ENCOUNTER — Other Ambulatory Visit (HOSPITAL_COMMUNITY): Payer: Self-pay | Admitting: Hematology & Oncology

## 2016-06-04 ENCOUNTER — Ambulatory Visit (INDEPENDENT_AMBULATORY_CARE_PROVIDER_SITE_OTHER): Payer: Medicare Other | Admitting: Neurology

## 2016-06-04 ENCOUNTER — Encounter: Payer: Self-pay | Admitting: Neurology

## 2016-06-04 VITALS — BP 154/82 | HR 66 | Ht 62.0 in | Wt 174.5 lb

## 2016-06-04 DIAGNOSIS — R262 Difficulty in walking, not elsewhere classified: Secondary | ICD-10-CM

## 2016-06-04 DIAGNOSIS — G20 Parkinson's disease: Secondary | ICD-10-CM | POA: Diagnosis not present

## 2016-06-04 MED ORDER — CARBIDOPA-LEVODOPA 25-100 MG PO TABS: 1.0000 | ORAL_TABLET | Freq: Three times a day (TID) | ORAL | 11 refills | Status: DC

## 2016-06-04 NOTE — Progress Notes (Signed)
[facility]  PATIENT: Lisa Torres DOB: [birth year]   HISTORY OF PRESENT ILLNESS:  Lisa Torres is a 70 years old right-handed female,was referred by her primary care physician Dr. 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 Her gait has changes, become slower,.  She has no anosmia, when she gets up quickly, she has lightheadedness, she is taking ativan as needed for insomnia, she has excessive movement during her sleep.  She denies significant memory trouble, she was getting tapering dose of prednisone, 20 mg, 3 tablets 5 days followed by 2 tablets 5 days followed by 1 tablets for 5 days for unclear reasons  Her findings are consistent with idiopathic Parkinson's disease, She is now taking mirapex 61m tid, it helped her right leg pain, gait difficulty, no significant side effect, MRI of the brain together, essentially normal, laboratory evaluation showed normal and negative B12, RPR, TSH, C-reactive protein, ESR, ANA, HIV  She was started on Azilect [place] Mirapex 1.5 milligrams 3 times a day since diagnosis in April,  For her complains of urinary urgency, generalized weakness, MRI of cervical spine in Oct 22 2014, showed no significant foraminal or canal stenosis.  UPDATE Mar 28 2016: She complains of bilateral leg deep achy pain since August 2017, worsening urinary urgency, gait  abnormality, she is overall happy with her current Parkinson's medications  She is taking Azilect 136mdaily, pramipexole 1.10m26mid at 8am, 12, 6pm. No significant side effect noticed  UPDATE Dec 19th 2017: She is now taking Mirapex 1.5 milligrams 3 times a day, which continued to help her, denies significant low back pain,  We have personally reviewed MRI of lumbar spine November 2017, multilevel degenerative disc disease, L2-3, there was evidence of disc bulging, facet hypertrophy, with mild to moderate spinal canal stenosis. MRI of thoracic spine, there was evidence of disc protrusion at T4-5, with slight deformity she and at the anterior spinal cord, but no evidence of cord compression,   She has high co-pay with Azilect  REVIEW OF SYSTEMS: Full 14 system review of systems performed and notable only for those listed, all others are neg:   Fatigue, unexpected weight change, excessive thirst, leg swelling, urgency, back pain, muscle cramps, walking difficulty, weakness, tremor   ALLERGIES: Allergies  Allergen Reactions  . Hydrocodone   . Tramadol     Hallucinations, abd pain    HOME MEDICATIONS: Outpatient Medications Prior to Visit  Medication Sig Dispense Refill  . amLODipine (NORVASC) 5 MG tablet Take 5 mg by mouth every morning.     . aMarland Kitchenastrozole (ARIMIDEX) 1 MG tablet TAKE 1 TABLET EVERY MORNING 90 tablet 3  . calcium carbonate (OS-CAL) 600 MG TABS tablet Take 600 mg by mouth 2 (two) times daily with a meal.    . Cholecalciferol (VITAMIN D-3) 1000 UNITS CAPS Take 1,000 Units by mouth 2 (two) times daily.    . lMarland Kitchenvothyroxine (SYNTHROID, LEVOTHROID) 100 MCG tablet Take 100 mcg by mouth daily before breakfast.    .  meloxicam (MOBIC) 15 MG tablet Take 15 mg by mouth daily.    . naproxen sodium (ALEVE) 220 MG tablet Take 440 mg by mouth 2 (two) times daily as needed (Pain).    . polyethylene glycol (MIRALAX / GLYCOLAX) packet Take 17 g by mouth daily as needed for moderate  constipation.     . pramipexole (MIRAPEX) 1 MG tablet Take 1.5 tablets (1.5 mg total) by mouth 3 (three) times daily. 405 tablet 3  . rasagiline (AZILECT) 1 MG TABS tablet Take 1 tablet (1 mg total) by mouth daily. 90 tablet 4  . traZODone (DESYREL) 50 MG tablet TAKE 1 TABLET BY MOUT AT BEDTIME  6  . triamcinolone cream (KENALOG) 0.1 % APPLY TO AFFECTED AREA UP TO TWICE DAILY AS NEEDED (NOT TO FACE, GROIN, OR UNDERARMS)  3   No facility-administered medications prior to visit.     PAST MEDICAL HISTORY: Past Medical History:  Diagnosis Date  . Breast cancer ([place]) 11/2011  . GERD (gastroesophageal reflux disease)   . Hypertension   . Radiation 04/2012   33 treatments for breast cancer  . Thyroid disease     PAST SURGICAL HISTORY: Past Surgical History:  Procedure Laterality Date  . ABDOMINAL HYSTERECTOMY     partial  . BREAST LUMPECTOMY Left 12/2011  . CHOLECYSTECTOMY    . COLONOSCOPY N/A 07/01/2013   Procedure: COLONOSCOPY;  Surgeon: Rogene Houston, MD;  Location: [facility];  Service: Endoscopy;  Laterality: N/A;  1030  . TUBAL LIGATION      FAMILY HISTORY: Family History  Problem Relation Age of Onset  . Cancer Mother 44    colon cancer  . Cancer Father 75    throat cancer  . Pancreatic cancer Brother     63y 11-14-15    SOCIAL HISTORY: Social History   Social History  . Marital status: Married    Spouse name: Marcello Moores  . Number of children: 3  . Years of education: 88 th   Occupational History  .      retired   Social History Main Topics  . Smoking status: Former Smoker    Packs/day: 0.50    Years: 18.00    Types: Cigarettes    Quit date: 09/30/1998  . Smokeless tobacco: Never Used  . Alcohol use No  . Drug use: No  . Sexual activity: Not on file   Other Topics Concern  . Not on file   Social History Narrative   Patient lives at home with her husband Marcello Moores).    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   Right handed     PHYSICAL EXAM  Vitals:     06/04/16 1303  BP: (!) 154/82  Pulse: 66  Weight: 174 lb 8 oz (79.2 kg)  Height: 5' 2" (1.575 m)   Body mass index is 31.92 kg/m. Gen: NAD, conversant, well nourised, obese, well groomed  Cardiovascular: Regular rate rhythm, 1+ peripheral edema on the right Neck: Supple, no carotid bruit. NEUROLOGICAL EXAM:  MENTAL STATUS: Speech:  Speech is normal; fluent and spontaneous with normal comprehension.  Cognition:The patient is oriented to person, place, and time; recent and remote memory intact;  language fluent; normal attention, concentration, fund of knowledge.  CRANIAL NERVES: CN II: Visual fields are full to confrontation. Fundoscopic exam is normal with sharp discs and no vascular changes.  Pupils are  briskly reactive to light. . CN III, IV, VI: extraocular movement are normal. No ptosis. CN V: Facial sensation  is intact to pinprick in all 3 divisions bilaterally. .  CN VII: Face is symmetric with normal eye closure and smile. CN VIII: Hearing is normal to rubbing fingers CN IX, X: Palate elevates symmetrically. Phonation is normal. CN XI: Head turning and shoulder shrug are intact CN XII: Tongue is midline with normal movements and no atrophy.  MOTOR: She has moderate right more than left limb and nuchal rigidity,mild bradykinesia,She has mild bilateral toe flexion extension weakness REFLEXES: Reflexes are 3 and symmetric at the biceps, triceps, knees, and ankles. Plantar responses are flexor. SENSORY: Light touch, pinprick, position sense, and vibration sense are intact in fingers and toes. COORDINATION: Rapid alternating movements and fine finger movements are intact. There is no dysmetria on finger-to-nose and heel-knee-shin. There are no abnormal or extraneous movements.  GAIT/STANCE: Need to push up to get off from seated position, stiff, cautious gait, small stride  DIAGNOSTIC DATA (LABS, IMAGING, TESTING) - I reviewed patient  records, labs, notes, testing and imaging myself where available.  Lab Results  Component Value Date   WBC 3.9 (L) 05/06/2016   HGB 12.0 05/06/2016   HCT 36.5 05/06/2016   MCV 87.3 05/06/2016   PLT 223 05/06/2016      Component Value Date/Time   NA 139 05/05/2015 1028   K 3.8 05/05/2015 1028   CL 103 05/05/2015 1028   CO2 28 05/05/2015 1028   GLUCOSE 93 05/05/2015 1028   BUN 11 05/05/2015 1028   CREATININE 0.85 05/05/2015 1028   CALCIUM 9.5 05/05/2015 1028   PROT 7.7 05/05/2015 1028   ALBUMIN 4.2 05/05/2015 1028   AST 22 05/05/2015 1028   ALT 20 05/05/2015 1028   ALKPHOS 80 05/05/2015 1028   BILITOT 0.5 05/05/2015 1028   GFRNONAA >60 05/05/2015 1028   GFRAA >60 05/05/2015 1028    ASSESSMENT AND PLAN Idiopathic Parkinson's disease  Continue  pramipexole 1.5 milligrams 3 times a day  Add on Sinemet 25/100 3 times a day,  Her insurance has high co-pay with Azilect  Worsening low back pain, bilateral lower extremity deep achy pain, urinary urgency  There was no significant evidence of lumbar radiculopathy   Marcial Pacas, M.D. Ph.D.  [facility] [place], [place] [address] Phone: [phone] Fax:      [phone]

## 2016-06-18 ENCOUNTER — Other Ambulatory Visit (HOSPITAL_COMMUNITY): Payer: Medicare Other

## 2016-06-22 ENCOUNTER — Encounter (HOSPITAL_COMMUNITY): Payer: Self-pay

## 2016-06-22 ENCOUNTER — Emergency Department (HOSPITAL_COMMUNITY): Payer: Medicare Other

## 2016-06-22 ENCOUNTER — Emergency Department (HOSPITAL_COMMUNITY)
Admission: EM | Admit: 2016-06-22 | Discharge: 2016-06-22 | Disposition: A | Discharge status: Home / Self Care | Payer: Medicare Other | Attending: Emergency Medicine | Admitting: Emergency Medicine

## 2016-06-22 DIAGNOSIS — Z87891 Personal history of nicotine dependence: Secondary | ICD-10-CM | POA: Insufficient documentation

## 2016-06-22 DIAGNOSIS — Z79899 Other long term (current) drug therapy: Secondary | ICD-10-CM | POA: Insufficient documentation

## 2016-06-22 DIAGNOSIS — I10 Essential (primary) hypertension: Secondary | ICD-10-CM | POA: Insufficient documentation

## 2016-06-22 DIAGNOSIS — G20 Parkinson's disease: Secondary | ICD-10-CM | POA: Diagnosis not present

## 2016-06-22 DIAGNOSIS — Z853 Personal history of malignant neoplasm of breast: Secondary | ICD-10-CM | POA: Insufficient documentation

## 2016-06-22 DIAGNOSIS — E039 Hypothyroidism, unspecified: Secondary | ICD-10-CM

## 2016-06-22 DIAGNOSIS — M542 Cervicalgia: Secondary | ICD-10-CM

## 2016-06-22 LAB — CBC WITH DIFFERENTIAL/PLATELET
BASOS ABS: 0 10*3/uL (ref 0.0–0.1)
BASOS PCT: 0 %
EOS ABS: 0.2 10*3/uL (ref 0.0–0.7)
Eosinophils Relative: 5 %
HCT: 39.1 % (ref 36.0–46.0)
Hemoglobin: 12.7 g/dL (ref 12.0–15.0)
Lymphocytes Relative: 24 %
Lymphs Abs: 1.1 10*3/uL (ref 0.7–4.0)
MCH: 29.3 pg (ref 26.0–34.0)
MCHC: 32.5 g/dL (ref 30.0–36.0)
MCV: 90.3 fL (ref 78.0–100.0)
MONO ABS: 0.3 10*3/uL (ref 0.1–1.0)
MONOS PCT: 7 %
Neutro Abs: 2.9 10*3/uL (ref 1.7–7.7)
Neutrophils Relative %: 64 %
Platelets: 227 10*3/uL (ref 150–400)
RBC: 4.33 MIL/uL (ref 3.87–5.11)
RDW: 13 % (ref 11.5–15.5)
WBC: 4.6 10*3/uL (ref 4.0–10.5)

## 2016-06-22 LAB — BASIC METABOLIC PANEL
Anion gap: 7 (ref 5–15)
BUN: 13 mg/dL (ref 6–20)
CALCIUM: 9.3 mg/dL (ref 8.9–10.3)
CO2: 29 mmol/L (ref 22–32)
CREATININE: 0.88 mg/dL (ref 0.44–1.00)
Chloride: 102 mmol/L (ref 101–111)
Glucose, Bld: 103 mg/dL — ABNORMAL HIGH (ref 65–99)
Potassium: 3.8 mmol/L (ref 3.5–5.1)
SODIUM: 138 mmol/L (ref 135–145)

## 2016-06-22 LAB — TSH: TSH: 26.648 u[IU]/mL — AB (ref 0.350–4.500)

## 2016-06-22 LAB — T4, FREE: FREE T4: 0.92 ng/dL (ref 0.61–1.12)

## 2016-06-22 MED ORDER — LEVOTHYROXINE SODIUM 112 MCG PO TABS: 112.0000 ug | ORAL_TABLET | Freq: Every day | ORAL | 0 refills | Status: DC

## 2016-06-22 MED ORDER — HYDROMORPHONE HCL 1 MG/ML IJ SOLN
0.5000 mg | Freq: Once | INTRAMUSCULAR | Status: AC
Start: 2016-06-22 — End: 2016-06-22
  Administered 2016-06-22: 0.5 mg via INTRAVENOUS
  Filled 2016-06-22: qty 1

## 2016-06-22 MED ORDER — IOPAMIDOL (ISOVUE-300) INJECTION 61%
75.0000 mL | Freq: Once | INTRAVENOUS | Status: AC | PRN
  Administered 2016-06-22: 75 mL via INTRAVENOUS

## 2016-06-22 MED ORDER — HYDROMORPHONE HCL 4 MG PO TABS: 4.0000 mg | ORAL_TABLET | Freq: Four times a day (QID) | ORAL | 0 refills | Status: DC | PRN

## 2016-06-22 MED ORDER — ACETAMINOPHEN 325 MG PO TABS
650.0000 mg | ORAL_TABLET | Freq: Once | ORAL | Status: AC
  Administered 2016-06-22: 650 mg via ORAL
  Filled 2016-06-22: qty 2

## 2016-06-22 NOTE — ED Provider Notes (Signed)
Results for orders placed or performed during the hospital encounter of 06/22/16  CBC with Differential/Platelet  Result Value Ref Range   WBC 4.6 4.0 - 10.5 K/uL   RBC 4.33 3.87 - 5.11 MIL/uL   Hemoglobin 12.7 12.0 - 15.0 g/dL   HCT 39.1 36.0 - 46.0 %   MCV 90.3 78.0 - 100.0 fL   MCH 29.3 26.0 - 34.0 pg   MCHC 32.5 30.0 - 36.0 g/dL   RDW 13.0 11.5 - 15.5 %   Platelets 227 150 - 400 K/uL   Neutrophils Relative % 64 %   Neutro Abs 2.9 1.7 - 7.7 K/uL   Lymphocytes Relative 24 %   Lymphs Abs 1.1 0.7 - 4.0 K/uL   Monocytes Relative 7 %   Monocytes Absolute 0.3 0.1 - 1.0 K/uL   Eosinophils Relative 5 %   Eosinophils Absolute 0.2 0.0 - 0.7 K/uL   Basophils Relative 0 %   Basophils Absolute 0.0 0.0 - 0.1 K/uL  Basic metabolic panel  Result Value Ref Range   Sodium 138 135 - 145 mmol/L   Potassium 3.8 3.5 - 5.1 mmol/L   Chloride 102 101 - 111 mmol/L   CO2 29 22 - 32 mmol/L   Glucose, Bld 103 (H) 65 - 99 mg/dL   BUN 13 6 - 20 mg/dL   Creatinine, Ser 0.88 0.44 - 1.00 mg/dL   Calcium 9.3 8.9 - 10.3 mg/dL   GFR calc non Af Amer >60 >60 mL/min   GFR calc Af Amer >60 >60 mL/min   Anion gap 7 5 - 15  TSH  Result Value Ref Range   TSH 26.648 (H) 0.350 - 4.500 uIU/mL   Ct Soft Tissue Neck W Contrast  Result Date: 06/22/2016 CLINICAL DATA:  Anterior neck pain.  History breast cancer. EXAM: CT NECK WITH CONTRAST TECHNIQUE: Multidetector CT imaging of the neck was performed using the standard protocol following the bolus administration of intravenous contrast. CONTRAST:  57mL ISOVUE-300 IOPAMIDOL (ISOVUE-300) INJECTION 61% COMPARISON:  08/02/2012 FINDINGS: Pharynx and larynx: No detected mass or swelling. Salivary glands: No inflammation, mass, or stone. Thyroid: 11 mm nodule in the left thyroid, size stable compared to 2014. Lymph nodes: None enlarged or abnormal density. Vascular: Negative. Limited intracranial: Negative. Visualized orbits: Negative. Mastoids and visualized paranasal sinuses:  Clear. Skeleton: No acute or aggressive process. Upper chest: Mild centrilobular emphysema. IMPRESSION: 1. No explanation for neck pain. 2. 11 mm left thyroid nodule that is size stable from 2014 CT. Electronically Signed   By: Monte Fantasia M.D.   On: 06/22/2016 07:33   No acute findings on the CT scan of the neck. Stable thyroid nodule.   Fredia Sorrow, MD 06/22/16 [phone]

## 2016-06-22 NOTE — ED Provider Notes (Signed)
[facility] DEPT Provider Note   CSN: [id number] Arrival date & time: 06/22/16  0440     History   Chief Complaint Chief Complaint  Patient presents with  . Neck Pain    HPI Lisa Torres is a 71 y.o. female.  Patient presents with 2 days of pain in her anterior neck that is progressively worsening. She denies any falls or trauma. The pain radiates around the right side of her neck is worse with palpation. Denies any focal weakness, numbness or tingling. Denies any fever. Denies any headache. Denies any weakness in her arms or legs. She is taking Tylenol at home without relief. Remote history of breast cancer status post radiation and chemotherapy. Patient also with history of hypothyroidism on Synthroid. Denies any difficulty breathing or difficulty swallowing. No chest pain.   The history is provided by the patient.  Neck Pain   Pertinent negatives include no chest pain, no headaches and no weakness.    Past Medical History:  Diagnosis Date  . Breast cancer ([place]) 11/2011  . GERD (gastroesophageal reflux disease)   . Hypertension   . Radiation 04/2012   33 treatments for breast cancer  . Thyroid disease     Patient Active Problem List   Diagnosis Date Noted  . Breast cancer, left breast ([place]) 11/02/2015  . Difficulty walking 10/20/2013  . Parkinson disease ([place]) 08/24/2013  . Hypothyroid 05/05/2013  . Hypertension 05/05/2013    Past Surgical History:  Procedure Laterality Date  . ABDOMINAL HYSTERECTOMY     partial  . BREAST LUMPECTOMY Left 12/2011  . CHOLECYSTECTOMY    . COLONOSCOPY N/A 07/01/2013   Procedure: COLONOSCOPY;  Surgeon: Rogene Houston, MD;  Location[facility];  Service: Endoscopy;  Laterality: N/A;  1030  . TUBAL LIGATION      OB History    Gravida Para Term Preterm AB Living   3 3 3     3    SAB TAB Ectopic Multiple Live Births                   Home Medications    Prior to Admission medications   Medication Sig Start Date  End Date Taking? Authorizing Provider  amLODipine (NORVASC) 5 MG tablet Take 5 mg by mouth every morning.     Historical Provider, MD  anastrozole (ARIMIDEX) 1 MG tablet TAKE 1 TABLET EVERY MORNING 05/26/16   Patrici Ranks, MD  calcium carbonate (OS-CAL) 600 MG TABS tablet Take 600 mg by mouth 2 (two) times daily with a meal.    Historical Provider, MD  carbidopa-levodopa (SINEMET IR) 25-100 MG tablet Take 1 tablet by mouth 3 (three) times daily. 06/04/16   Marcial Pacas, MD  Cholecalciferol (VITAMIN D-3) 1000 UNITS CAPS Take 1,000 Units by mouth 2 (two) times daily.    Historical Provider, MD  levothyroxine (SYNTHROID, LEVOTHROID) 100 MCG tablet Take 100 mcg by mouth daily before breakfast.    Historical Provider, MD  meloxicam (MOBIC) 15 MG tablet Take 15 mg by mouth daily.    Historical Provider, MD  naproxen sodium (ALEVE) 220 MG tablet Take 440 mg by mouth 2 (two) times daily as needed (Pain).    Historical Provider, MD  polyethylene glycol (MIRALAX / GLYCOLAX) packet Take 17 g by mouth daily as needed for moderate constipation.     Historical Provider, MD  pramipexole (MIRAPEX) 1 MG tablet Take 1.5 tablets (1.5 mg total) by mouth 3 (three) times daily. 11/30/15   Marcial Pacas,  MD  traZODone (DESYREL) 50 MG tablet TAKE 1 TABLET BY MOUT AT BEDTIME 11/08/15   Historical Provider, MD  triamcinolone cream (KENALOG) 0.1 % APPLY TO AFFECTED AREA UP TO TWICE DAILY AS NEEDED (NOT TO FACE, GROIN, OR UNDERARMS) 03/12/16   Historical Provider, MD    Family History Family History  Problem Relation Age of Onset  . Cancer Mother 56    colon cancer  . Cancer Father 75    throat cancer  . Pancreatic cancer Brother     [phone] 11-14-15    Social History Social History  Substance Use Topics  . Smoking status: Former Smoker    Packs/day: 0.50    Years: 18.00    Types: Cigarettes    Quit date: 09/30/1998  . Smokeless tobacco: Never Used  . Alcohol use No     Allergies   Hydrocodone and  Tramadol   Review of Systems Review of Systems  Constitutional: Negative for activity change, appetite change and fever.  HENT: Negative for dental problem, trouble swallowing and voice change.   Eyes: Negative for visual disturbance.  Respiratory: Negative for cough and shortness of breath.   Cardiovascular: Negative for chest pain.  Gastrointestinal: Negative for abdominal pain and vomiting.  Genitourinary: Negative for dysuria and hematuria.  Musculoskeletal: Positive for neck pain.  Neurological: Negative for dizziness, weakness, light-headedness and headaches.   A complete 10 system review of systems was obtained and all systems are negative except as noted in the HPI and PMH.    Physical Exam Updated Vital Signs BP 174/85 (BP Location: Right Arm)   Pulse 71   Temp 98.3 F (36.8 C) (Oral)   Resp 15   Ht 5' 1.5" (1.562 m)   Wt 174 lb (78.9 kg)   SpO2 100%   BMI 32.34 kg/m   Physical Exam  Constitutional: She is oriented to person, place, and time. She appears well-developed and well-nourished. No distress.  HENT:  Head: Normocephalic and atraumatic.  Mouth/Throat: Oropharynx is clear and moist. No oropharyngeal exudate.  Eyes: Conjunctivae and EOM are normal. Pupils are equal, round, and reactive to light.  Neck: Normal range of motion. Neck supple. Thyromegaly present.  No meningismus. TTP R anterior and lateral neck, no palpable mass. No midline C spine tenderness. No carotid bruits.  Cardiovascular: Normal rate, regular rhythm, normal heart sounds and intact distal pulses.   No murmur heard. Pulmonary/Chest: Effort normal and breath sounds normal. No stridor. No respiratory distress.  Abdominal: Soft. There is no tenderness. There is no rebound and no guarding.  Musculoskeletal: Normal range of motion. She exhibits no edema or tenderness.  Lymphadenopathy:    She has no cervical adenopathy.  Neurological: She is alert and oriented to person, place, and time. No  cranial nerve deficit. She exhibits normal muscle tone. Coordination normal.  No ataxia on finger to nose bilaterally. No pronator drift. 5/5 strength throughout. CN 2-12 intact.Equal grip strength. Sensation intact.   Skin: Skin is warm.  Psychiatric: She has a normal mood and affect. Her behavior is normal.  Nursing note and vitals reviewed.    ED Treatments / Results  Labs (all labs ordered are listed, but only abnormal results are displayed) Labs Reviewed  BASIC METABOLIC PANEL - Abnormal; Notable for the following:       Result Value   Glucose, Bld 103 (*)    All other components within normal limits  TSH - Abnormal; Notable for the following:    TSH 26.648 (*)  All other components within normal limits  CBC WITH DIFFERENTIAL/PLATELET  T4, FREE    EKG  EKG Interpretation None       Radiology No results found.  Procedures Procedures (including critical care time)  Medications Ordered in ED Medications  acetaminophen (TYLENOL) tablet 650 mg (not administered)     Initial Impression / Assessment and Plan / ED Course  I have reviewed the triage vital signs and the nursing notes.  Pertinent labs & imaging results that were available during my care of the patient were reviewed by me and considered in my medical decision making (see chart for details).  Clinical Course   Atraumatic anterior neck pain.  Neuro intact. No fever.  No difficulty breathing or swallowing.  Check TSH and FT4 given history of hypothyroidism.  CT will be obtained to evaluate for mass. TSH elevated.  Synthroid dose increased, will need PCP followup and recheck.   CT pending at time of sign out to Dr. Rogene Houston.  Final Clinical Impressions(s) / ED Diagnoses   Final diagnoses:  Neck pain  Hypothyroidism, unspecified type    New Prescriptions New Prescriptions   No medications on file     Ezequiel Essex, MD 06/22/16 [phone]

## 2016-06-22 NOTE — ED Notes (Signed)
Patient waiting med time.  

## 2016-06-22 NOTE — ED Triage Notes (Signed)
Neck pain that started on Thursday. Worse since then, no injury or strain

## 2016-06-22 NOTE — Discharge Instructions (Signed)
Increase your synthroid to 112 mcg daily. Follow up with Dr. Quintin Alto to have your thyroid function recheck in 1 week.

## 2016-06-24 DIAGNOSIS — I10 Essential (primary) hypertension: Secondary | ICD-10-CM | POA: Diagnosis not present

## 2016-06-24 DIAGNOSIS — E039 Hypothyroidism, unspecified: Secondary | ICD-10-CM | POA: Diagnosis not present

## 2016-06-24 DIAGNOSIS — M542 Cervicalgia: Secondary | ICD-10-CM | POA: Diagnosis not present

## 2016-06-24 DIAGNOSIS — Z6831 Body mass index (BMI) 31.0-31.9, adult: Secondary | ICD-10-CM | POA: Diagnosis not present

## 2016-07-21 ENCOUNTER — Emergency Department (HOSPITAL_COMMUNITY): Payer: Medicare Other

## 2016-07-21 ENCOUNTER — Emergency Department (HOSPITAL_COMMUNITY)
Admission: EM | Admit: 2016-07-21 | Discharge: 2016-07-21 | Disposition: A | Discharge status: Home / Self Care | Payer: Medicare Other | Attending: Emergency Medicine | Admitting: Emergency Medicine

## 2016-07-21 ENCOUNTER — Encounter (HOSPITAL_COMMUNITY): Payer: Self-pay | Admitting: Emergency Medicine

## 2016-07-21 DIAGNOSIS — K5900 Constipation, unspecified: Secondary | ICD-10-CM

## 2016-07-21 DIAGNOSIS — R509 Fever, unspecified: Secondary | ICD-10-CM | POA: Diagnosis not present

## 2016-07-21 DIAGNOSIS — E039 Hypothyroidism, unspecified: Secondary | ICD-10-CM | POA: Diagnosis not present

## 2016-07-21 DIAGNOSIS — Z79899 Other long term (current) drug therapy: Secondary | ICD-10-CM | POA: Diagnosis not present

## 2016-07-21 DIAGNOSIS — R05 Cough: Secondary | ICD-10-CM

## 2016-07-21 DIAGNOSIS — G20 Parkinson's disease: Secondary | ICD-10-CM | POA: Insufficient documentation

## 2016-07-21 DIAGNOSIS — R1010 Upper abdominal pain, unspecified: Secondary | ICD-10-CM

## 2016-07-21 DIAGNOSIS — R109 Unspecified abdominal pain: Secondary | ICD-10-CM | POA: Diagnosis present

## 2016-07-21 DIAGNOSIS — R059 Cough, unspecified: Secondary | ICD-10-CM

## 2016-07-21 DIAGNOSIS — Z87891 Personal history of nicotine dependence: Secondary | ICD-10-CM | POA: Insufficient documentation

## 2016-07-21 DIAGNOSIS — I10 Essential (primary) hypertension: Secondary | ICD-10-CM | POA: Insufficient documentation

## 2016-07-21 HISTORY — DX: Pain in leg, unspecified: M79.606

## 2016-07-21 HISTORY — DX: Other intervertebral disc degeneration, lumbar region: M51.36

## 2016-07-21 HISTORY — DX: Decreased white blood cell count, unspecified: D72.819

## 2016-07-21 HISTORY — DX: Other intervertebral disc degeneration, lumbar region without mention of lumbar back pain or lower extremity pain: M51.369

## 2016-07-21 HISTORY — DX: Other cervical disc degeneration, unspecified cervical region: M50.30

## 2016-07-21 HISTORY — DX: Parkinson's disease without dyskinesia, without mention of fluctuations: G20.A1

## 2016-07-21 HISTORY — DX: Other chronic pain: G89.29

## 2016-07-21 HISTORY — DX: Dorsalgia, unspecified: M54.9

## 2016-07-21 HISTORY — DX: Pain in right knee: M25.561

## 2016-07-21 HISTORY — DX: Unspecified abnormalities of gait and mobility: R26.9

## 2016-07-21 HISTORY — DX: Parkinson's disease: G20

## 2016-07-21 LAB — BASIC METABOLIC PANEL
Anion gap: 7 (ref 5–15)
BUN: 15 mg/dL (ref 6–20)
CHLORIDE: 102 mmol/L (ref 101–111)
CO2: 32 mmol/L (ref 22–32)
CREATININE: 1.02 mg/dL — AB (ref 0.44–1.00)
Calcium: 9.7 mg/dL (ref 8.9–10.3)
GFR, EST NON AFRICAN AMERICAN: 54 mL/min — AB (ref >60)
Glucose, Bld: 95 mg/dL (ref 65–99)
Potassium: 4.4 mmol/L (ref 3.5–5.1)
SODIUM: 141 mmol/L (ref 135–145)

## 2016-07-21 LAB — CBC WITH DIFFERENTIAL/PLATELET
BASOS ABS: 0 10*3/uL (ref 0.0–0.1)
BASOS PCT: 0 %
EOS ABS: 0.1 10*3/uL (ref 0.0–0.7)
Eosinophils Relative: 3 %
HCT: 37.2 % (ref 36.0–46.0)
HEMOGLOBIN: 12.2 g/dL (ref 12.0–15.0)
LYMPHS ABS: 1 10*3/uL (ref 0.7–4.0)
Lymphocytes Relative: 20 %
MCH: 29.2 pg (ref 26.0–34.0)
MCHC: 32.8 g/dL (ref 30.0–36.0)
MCV: 89 fL (ref 78.0–100.0)
Monocytes Absolute: 0.4 10*3/uL (ref 0.1–1.0)
Monocytes Relative: 9 %
NEUTROS PCT: 68 %
Neutro Abs: 3.3 10*3/uL (ref 1.7–7.7)
Platelets: 269 10*3/uL (ref 150–400)
RBC: 4.18 MIL/uL (ref 3.87–5.11)
RDW: 13.1 % (ref 11.5–15.5)
WBC: 4.9 10*3/uL (ref 4.0–10.5)

## 2016-07-21 LAB — TROPONIN I

## 2016-07-21 LAB — HEPATIC FUNCTION PANEL
ALBUMIN: 4.2 g/dL (ref 3.5–5.0)
ALT: 38 U/L (ref 14–54)
AST: 29 U/L (ref 15–41)
Alkaline Phosphatase: 90 U/L (ref 38–126)
Bilirubin, Direct: 0.1 mg/dL (ref 0.1–0.5)
Indirect Bilirubin: 0.5 mg/dL (ref 0.3–0.9)
TOTAL PROTEIN: 7.4 g/dL (ref 6.5–8.1)
Total Bilirubin: 0.6 mg/dL (ref 0.3–1.2)

## 2016-07-21 LAB — LIPASE, BLOOD: LIPASE: 19 U/L (ref 11–51)

## 2016-07-21 MED ORDER — DOCUSATE SODIUM 100 MG PO CAPS: 100.0000 mg | ORAL_CAPSULE | Freq: Two times a day (BID) | ORAL | 0 refills | Status: DC

## 2016-07-21 MED ORDER — IOPAMIDOL (ISOVUE-300) INJECTION 61%
100.0000 mL | Freq: Once | INTRAVENOUS | Status: AC | PRN
  Administered 2016-07-21: 100 mL via INTRAVENOUS

## 2016-07-21 MED ORDER — POLYETHYLENE GLYCOL 3350 17 GM/SCOOP PO POWD: 17.0000 g | Freq: Every day | ORAL | 0 refills | Status: DC

## 2016-07-21 MED ORDER — MAGNESIUM CITRATE PO SOLN: 1.0000 | Freq: Once | ORAL | 0 refills | Status: DC | PRN

## 2016-07-21 NOTE — ED Triage Notes (Signed)
Patient c/o cough, fever, and chills x2 weeks. Patient also c/o constipation and weakness in lower extremities bilaterally. Patient seen by PCP on Friday and told arthritis in lower extremities and has been taking tylenol extra strength.  Per patient small BMs with using stool softeners and laxatives.

## 2016-07-21 NOTE — ED Provider Notes (Signed)
[facility] DEPT Provider Note   CSN: [id number] Arrival date & time: 07/21/16  1231     History   Chief Complaint Chief Complaint  Patient presents with  . Cough    HPI Lisa Torres is a 71 y.o. female. with history of breast cancer s/p radiation and chemotherapy, chronic bilateral leg pain, and hypertension that presents to the ED for constipation.  She reports her last stool was this morning but with the consistency of pellets.  She has tried stool softeners and magnesium citrate with little benefit.  She has associated symptoms of abdominal pain and chest tightness.  She reports the chest tightness started 2 days ago and it comes and goes throughout the day.  It is localized to the center of her chest.  She states it is worse with movement.  She also reports a productive cough with clear sputum that started yesterday with associated subjective fevers and nausea.  She denies sick contacts.  She denies shortness of breath, vomiting, or chest pain.     HPI  Past Medical History:  Diagnosis Date  . Breast cancer ([place]) 11/2011  . Chronic back pain   . Chronic leg pain    bilateral  . Chronic leukopenia   . DDD (degenerative disc disease), cervical   . DDD (degenerative disc disease), lumbar   . Gait abnormality   . GERD (gastroesophageal reflux disease)   . Hypertension   . Parkinson's disease ([place])   . Radiation 04/2012   33 treatments for breast cancer  . Right knee pain   . Thyroid disease     Patient Active Problem List   Diagnosis Date Noted  . Breast cancer, left breast ([place]) 11/02/2015  . Difficulty walking 10/20/2013  . Parkinson disease ([place]) 08/24/2013  . Hypothyroid 05/05/2013  . Hypertension 05/05/2013    Past Surgical History:  Procedure Laterality Date  . ABDOMINAL HYSTERECTOMY     partial  . BREAST LUMPECTOMY Left 12/2011  . CHOLECYSTECTOMY    . COLONOSCOPY N/A 07/01/2013   Procedure: COLONOSCOPY;  Surgeon: Rogene Houston, MD;  Location: [facility]  ENDO [facility]ITE;  Service: Endoscopy;  Laterality: N/A;  1030  . TUBAL LIGATION      OB History    Gravida Para Term Preterm AB Living   3 3 3     3    SAB TAB Ectopic Multiple Live Births                   Home Medications    Prior to Admission medications   Medication Sig Start Date End Date Taking? Authorizing Provider  amLODipine (NORVASC) 5 MG tablet Take 5 mg by mouth every morning.    Yes Historical Provider, MD  anastrozole (ARIMIDEX) 1 MG tablet TAKE 1 TABLET EVERY MORNING 05/26/16  Yes Patrici Ranks, MD  calcium carbonate (OS-CAL) 600 MG TABS tablet Take 600 mg by mouth 2 (two) times daily with a meal.   Yes Historical Provider, MD  carbidopa-levodopa (SINEMET IR) 25-100 MG tablet Take 1 tablet by mouth 3 (three) times daily. 06/04/16  Yes Marcial Pacas, MD  Cholecalciferol (VITAMIN D-3) 1000 UNITS CAPS Take 1,000 Units by mouth 2 (two) times daily.   Yes Historical Provider, MD  levothyroxine (SYNTHROID, LEVOTHROID) 112 MCG tablet Take 1 tablet (112 mcg total) by mouth daily before breakfast. 06/22/16  Yes Ezequiel Essex, MD  meloxicam (MOBIC) 15 MG tablet Take 15 mg by mouth daily.   Yes Historical Provider, MD  naproxen sodium (  ALEVE) 220 MG tablet Take 440 mg by mouth 2 (two) times daily as needed (Pain).   Yes Historical Provider, MD  polyethylene glycol (MIRALAX / GLYCOLAX) packet Take 17 g by mouth daily as needed for moderate constipation.    Yes Historical Provider, MD  traZODone (DESYREL) 50 MG tablet TAKE 1 TABLET BY MOUT AT BEDTIME 11/08/15  Yes Historical Provider, MD  triamcinolone cream (KENALOG) 0.1 % APPLY TO AFFECTED AREA UP TO TWICE DAILY AS NEEDED (NOT TO FACE, GROIN, OR UNDERARMS) 03/12/16  Yes Historical Provider, MD  Wheat Dextrin (BENEFIBER DRINK MIX PO) Take 1 scoop by mouth daily.   Yes Historical Provider, MD  HYDROmorphone (DILAUDID) 4 MG tablet Take 1 tablet (4 mg total) by mouth every 6 (six) hours as needed for severe pain. Patient not taking: Reported on  07/21/2016 06/22/16   Fredia Sorrow, MD  pramipexole (MIRAPEX) 1 MG tablet Take 1.5 tablets (1.5 mg total) by mouth 3 (three) times daily. 11/30/15   Marcial Pacas, MD    Family History Family History  Problem Relation Age of Onset  . Cancer Mother 5    colon cancer  . Cancer Father 75    throat cancer  . Pancreatic cancer Brother     [phone] 11-14-15    Social History Social History  Substance Use Topics  . Smoking status: Former Smoker    Packs/day: 0.50    Years: 18.00    Types: Cigarettes    Quit date: 09/30/1998  . Smokeless tobacco: Never Used  . Alcohol use No     Allergies   Hydrocodone and Tramadol   Review of Systems Review of Systems  Constitutional: Positive for chills and fever.  Respiratory: Positive for cough.   Cardiovascular: Negative for chest pain.  Gastrointestinal: Positive for abdominal pain and constipation.     Physical Exam Updated Vital Signs BP 157/73 (BP Location: Right Arm)   Pulse 86   Temp 97.5 F (36.4 C) (Oral)   Resp 18   Ht 5' 1.5" (1.562 m)   Wt 76.2 kg   SpO2 100%   BMI 31.23 kg/m   Physical Exam  Constitutional: No distress.  Cardiovascular: Normal rate, regular rhythm and normal heart sounds.  Exam reveals no gallop and no friction rub.   No murmur heard. Pulmonary/Chest: Effort normal and breath sounds normal. No respiratory distress. She has no wheezes. She has no rales. She exhibits tenderness.  Abdominal: Soft. She exhibits no distension and no mass. There is tenderness. There is no rebound and no guarding.  Musculoskeletal:  Mild swelling bilaterally   Skin: Skin is warm and dry.     ED Treatments / Results  Labs (all labs ordered are listed, but only abnormal results are displayed) Labs Reviewed  BASIC METABOLIC PANEL - Abnormal; Notable for the following:       Result Value   Creatinine, Ser 1.02 (*)    GFR calc non Af Amer 54 (*)    All other components within normal limits  CBC WITH DIFFERENTIAL/PLATELET    TROPONIN I    EKG  EKG Interpretation  Date/Time:  Sunday July 21 2016 14:13:23 EST Ventricular Rate:  73 PR Interval:    QRS Duration: 91 QT Interval:  389 QTC Calculation: 429 R Axis:   43 Text Interpretation:  Sinus rhythm Borderline T wave abnormalities Confirmed by Reather Converse MD, JOSHUA [phone]) on 07/21/2016 2:18:44 PM       Radiology Dg Chest 2 View  Result Date: 07/21/2016 CLINICAL  DATA:  Cough and fever. EXAM: CHEST  2 VIEW COMPARISON:  None. FINDINGS: Mild cardiomegaly. The hila and mediastinum are normal. No pulmonary nodules, masses, or focal infiltrates. IMPRESSION: No active cardiopulmonary disease. Electronically Signed   By: Dorise Bullion III M.D   On: 07/21/2016 13:23   Dg Abdomen 1 View  Result Date: 07/21/2016 CLINICAL DATA:  Constipation. EXAM: ABDOMEN - 1 VIEW COMPARISON:  None. FINDINGS: The bowel gas pattern is normal. No radio-opaque calculi or other significant radiographic abnormality are seen. Status post cholecystectomy. Significant amount of stool burden is not identified on this radiograph. IMPRESSION: No evidence of bowel obstruction or ileus. No significant stool burden is noted. Electronically Signed   By: Marijo Conception, M.D.   On: 07/21/2016 14:15    Procedures Procedures (including critical care time)  Medications Ordered in ED Medications - No data to display   Initial Impression / Assessment and Plan / ED Course  I have reviewed the triage vital signs and the nursing notes.  Pertinent labs & imaging results that were available during my care of the patient were reviewed by me and considered in my medical decision making (see chart for details).   Patient has a history of abdominal surgeries and an abdominal x-ray to r/o small bowel obstruction was done.  Abdominal x-ray showed no evidence of bowel obstruction or ileus. There was no significant stool burden noted.  Patient states that her abdominal pain is worse than her usual  constipation.  Getting a CT scan of abdomen/pelvis.  Results pending at time of sign out.  Patient also has a 2 day history of chest tightness.  EKG unchanged from previous with normal sinus rhythm, no T-wave inversion and non-specific ST wave elevations.  Troponins pending at time of sign out        Final Clinical Impressions(s) / ED Diagnoses   Final diagnoses:  None    New Prescriptions New Prescriptions   No medications on file     Valinda Party, DO 07/21/16 1509    Elnora Morrison, MD 07/21/16 [phone]

## 2016-07-21 NOTE — Discharge Instructions (Signed)
Your work-up is reassuring. Your CT scan shows constipation. Take miralax as instructed and you can take three times daily if no good effect after 1-2 times daily after a few days. Take bottle of magnesium citrate only if still constipation after taking miralax and colace.  Return for worsening symptoms, including fever, vomiting, confusion, escalating pain or any other symptoms concerning to you.

## 2016-07-21 NOTE — ED Provider Notes (Signed)
.   Please see previous physicians note regarding patient's presenting history and physical, initial ED course, and associated medical decision making.  CT abdomen and pelvis visualized and shows no intra-abdominal processes explaining her abdominal pain. She does have some element of constipation, and she says that she has not had a bowel movement in 3-4 days. We'll start a bowel regimen. Remainder blood work reviewed and shows no major abnormalities. Normal lipase and hepatic function panel.  The patient appears reasonably screened and/or stabilized for discharge and I doubt any other medical condition or other [facility] requiring further screening, evaluation, or treatment in the ED at this time prior to discharge.  At this time she is felt to be stable for discharge home. Strict return and follow-up instructions reviewed. She expressed understanding of all discharge instructions and felt comfortable with the plan of care.    Forde Dandy, MD 07/21/16 1910

## 2016-07-29 ENCOUNTER — Other Ambulatory Visit (HOSPITAL_COMMUNITY): Payer: Medicare Other

## 2016-07-29 ENCOUNTER — Ambulatory Visit (HOSPITAL_COMMUNITY): Payer: Medicare Other | Admitting: Hematology & Oncology

## 2016-08-05 DIAGNOSIS — K210 Gastro-esophageal reflux disease with esophagitis: Secondary | ICD-10-CM | POA: Diagnosis not present

## 2016-08-05 DIAGNOSIS — E039 Hypothyroidism, unspecified: Secondary | ICD-10-CM | POA: Diagnosis not present

## 2016-08-05 DIAGNOSIS — E782 Mixed hyperlipidemia: Secondary | ICD-10-CM | POA: Diagnosis not present

## 2016-08-05 DIAGNOSIS — I10 Essential (primary) hypertension: Secondary | ICD-10-CM | POA: Diagnosis not present

## 2016-09-03 ENCOUNTER — Ambulatory Visit (HOSPITAL_COMMUNITY): Payer: Medicare Other | Attending: Orthopedic Surgery | Admitting: Physical Therapy

## 2016-09-03 DIAGNOSIS — M6281 Muscle weakness (generalized): Secondary | ICD-10-CM | POA: Diagnosis present

## 2016-09-03 DIAGNOSIS — M25661 Stiffness of right knee, not elsewhere classified: Secondary | ICD-10-CM | POA: Diagnosis present

## 2016-09-03 DIAGNOSIS — M25561 Pain in right knee: Secondary | ICD-10-CM | POA: Diagnosis present

## 2016-09-03 DIAGNOSIS — G8929 Other chronic pain: Secondary | ICD-10-CM | POA: Insufficient documentation

## 2016-09-03 DIAGNOSIS — M25562 Pain in left knee: Secondary | ICD-10-CM | POA: Insufficient documentation

## 2016-09-03 DIAGNOSIS — M25662 Stiffness of left knee, not elsewhere classified: Secondary | ICD-10-CM | POA: Insufficient documentation

## 2016-09-03 NOTE — Patient Instructions (Addendum)
Heel Raise (Sitting)    Raise heels, keeping toes on floor. Now raise your forefoot up keeping your heel on the ground. Repeat _10___ times per set. Do __1__ sets per session. Do _3___ sessions per day.  http://orth.exer.us/44   http://orth.exer.us/602   Copyright  VHI. All rights reserved.  Knee Extension (Sitting)    Place __0__ pound weight on left ankle and straighten knee fully, lower slowly. Repeat _10___ times per set. Do ____ sets per session. Do 3____ sessions per day. 1 http://orth.exer.us/732   Copyright  VHI. All rights reserved.   Copyright  VHI. All rights reserved.  Strengthening: Quadriceps Set    Tighten muscles on top of thighs by pushing knees down into surface. Hold __3__ seconds. Repeat _10___ times per set. Do __1__ sets per session. Do _2___ sessions per day.Strengthening: Terminal Knee Extension (Supine)    With right knee over bolster, straighten knee by tightening muscles on top of thigh. Keep bottom of knee on bolster. Repeat __10__ times per set. Do __1__ sets per session. Do __3__ sessions per day.  http://orth.exer.us/626   Copyright  VHI. All rights reserved.  Stretching: Hamstring (Supine)    Supporting right thigh behind knee, slowly straighten knee until stretch is felt in back of thigh. Hold __30__ seconds. Repeat ___3_ times per set. Do _1___ sets per session. Do __2__ sessions per day.  http://orth.exer.us/656   Copyright  VHI. All rights reserved.  Bridging    Slowly raise buttocks from floor, keeping stomach tight. Repeat __10__ times per set. Do _1___ sets per session. Do ___2_ sessions per day.  http://orth.exer.us/1096   Copyright  VHI. All rights reserved.

## 2016-09-03 NOTE — Therapy (Signed)
[facility] [place], [place], [address] Phone: [phone]   Fax:  [phone]  Physical Therapy Evaluation  Patient Details  Name: Lisa Torres MRN: [id number] Date of Birth: 1945/07/31 Referring Provider: Remo Lipps Case  Encounter Date: 09/03/2016      PT End of Session - 09/03/16 1453    Visit Number 1   Number of Visits 12   Date for PT Re-Evaluation 10/03/16   Authorization Type [employer] medicare   Authorization - Visit Number 1   Authorization - Number of Visits 12   PT Start Time 1696   PT Stop Time 1440   PT Time Calculation (min) 53 min      Past Medical History:  Diagnosis Date  . Breast cancer ([place]) 11/2011  . Chronic back pain   . Chronic leg pain    bilateral  . Chronic leukopenia   . DDD (degenerative disc disease), cervical   . DDD (degenerative disc disease), lumbar   . Gait abnormality   . GERD (gastroesophageal reflux disease)   . Hypertension   . Parkinson's disease ([place])   . Radiation 04/2012   33 treatments for breast cancer  . Right knee pain   . Thyroid disease     Past Surgical History:  Procedure Laterality Date  . ABDOMINAL HYSTERECTOMY     partial  . BREAST LUMPECTOMY Left 12/2011  . CHOLECYSTECTOMY    . COLONOSCOPY N/A 07/01/2013   Procedure: COLONOSCOPY;  Surgeon: Rogene Houston, MD;  Location: [facility]O SU[facility];  Service: Endoscopy;  Laterality: N/A;  1030  . TUBAL LIGATION      There were no vitals filed for this visit.       Subjective Assessment - 09/03/16 1349    Subjective Ms. Drollinger states that her right knee began bothering her prior to her left knee last year.  Her right knee continues to be more painful.  She had injections placed in both her knees on 08/21/2016 which seems to have helped her pain about 25%.  She states that she can have pain at any point she does not need to be weight bearing.  The pain is mostly in the front and she uses a cane intermittently  throughout the day.    Pertinent History HTN, Cancer, cervical and lumbar pain,    How long can you sit comfortably? 30 minutes    How long can you stand comfortably? 15 minutes   How long can you walk comfortably? 15 minutes   Patient Stated Goals less pain, be able to walk longer    Currently in Pain? No/denies   Pain Score --  worst pain in the past week  7/10   Pain Location Knee   Pain Orientation Right;Left   Pain Type Chronic pain   Pain Frequency Intermittent   Aggravating Factors  standing    Pain Relieving Factors ice and heat    Effect of Pain on Daily Activities increases            [facility] PT Assessment - 09/03/16 0001      Assessment   Medical Diagnosis B knee pain    Referring Provider Remo Lipps Case   Onset Date/Surgical Date 08/21/16   Next MD Visit 11/24/2016   Prior Therapy several years ago      Precautions   Precautions None     Restrictions   Weight Bearing Restrictions No     Balance Screen   Has the patient fallen  in the past 6 months No   Has the patient had a decrease in activity level because of a fear of falling?  Yes   Is the patient reluctant to leave their home because of a fear of falling?  No     Home Environment   Living Environment Private residence   Type of Dobbins to enter   Entrance Stairs-Number of Steps 6  unable to go up or down steps in a reciprocal manner.    Home Layout One level     Prior Function   Level of Independence Independent   Vocation Retired   Leisure walk, read, crossword puzzles      Cognition   Overall Cognitive Status Within Functional Limits for tasks assessed     Observation/Other Assessments   Focus on Therapeutic Outcomes (FOTO)  72     Functional Tests   Functional tests Sit to Stand     Sit to Stand   Comments 5 times 20.88      ROM / Strength   AROM / PROM / Strength AROM;Strength     AROM   AROM Assessment Site Knee   Right/Left Knee Right;Left   Right Knee  Extension 30   Right Knee Flexion 125   Left Knee Extension 22   Left Knee Flexion 130     Strength   Strength Assessment Site Hip;Knee;Ankle   Right/Left Hip Right;Left   Right Hip Flexion 5/5   Right Hip Extension 2/5   Right Hip ABduction 4/5   Left Hip Flexion 5/5   Left Hip Extension 2/5   Left Hip ABduction 4/5   Right/Left Knee Right;Left   Right Knee Flexion 5/5   Right Knee Extension 5/5   Left Knee Flexion 5/5   Left Knee Extension 5/5   Right/Left Ankle Right;Left   Right Ankle Dorsiflexion 3/5   Left Ankle Plantar Flexion 3+/5     Flexibility   Soft Tissue Assessment /Muscle Length yes   Hamstrings Rt 145; Lt 150    Quadriceps prone Lt 100 ; RT 110                   [facility] Adult PT Treatment/Exercise - 09/03/16 0001      Exercises   Exercises Knee/Hip     Knee/Hip Exercises: Stretches   Active Hamstring Stretch Both;2 reps;30 seconds     Knee/Hip Exercises: Seated   Long Arc Quad Both;10 reps   Other Seated Knee/Hip Exercises seated heel/toe raise x 10      Knee/Hip Exercises: Supine   Quad Sets Both;10 reps   Terminal Knee Extension Both;10 reps                PT Education - 09/03/16 1452    Education provided Yes   Education Details HEP   Person(s) Educated Patient   Methods Explanation   Comprehension Verbalized understanding          PT Short Term Goals - 09/03/16 1500      PT SHORT TERM GOAL #1   Title Pt bilateral knee pain to be no greater than a 5/10 to allow pt to ambulate for up to 30 minutes.     Time 3   Period Weeks   Status New     PT SHORT TERM GOAL #2   Title Pt to be able to sit for 40 mintues without increased  Bilateral knee pain to be able to go to a  resaurant and enjoy a meal.    Time 3   Period Weeks   Status New     PT SHORT TERM GOAL #3   Title Pt bilateral   hip extension strength to be increased 1/2 grade to allow pt to come from sit to stand with increased ease.     Time 3   Period  Weeks           PT Long Term Goals - 09/03/16 1513      PT LONG TERM GOAL #1   Title Pt bilateral knee pain to be no greater than a 3/10 to allow pt to walk in comfort for up to 40 mintues for shopping    Time 6   Period Weeks   Status New     PT LONG TERM GOAL #2   Title Pt B hamstring length to have increased 10 degrees to allow decrease knee stress to allow decreased knee pain .    Time 6   Period Weeks   Status New     PT LONG TERM GOAL #3   Title Pt hip extensors to both be at least 3+/5 to allow pt to go up and down steps in a reciprocal manner.    Time 6   Period Weeks   Status New     PT LONG TERM GOAL #4   Title Pt bilateral knee extension to have improved by 10 degrees to allow a more normalized gait to take stress off of knees, hips and back.    Time 6   Period Weeks   Status New     PT LONG TERM GOAL #5   Title Pt to be able to sit for an hour with no increased knee pain to allow pt to feel like she can participate in church services.    Time 6   Period Weeks   Status New     PT LONG TERM GOAL #6   Title Pt to states that she has not had to use her cane in the past week to assist with ambulation    Time 6   Period Weeks   Status New               Plan - 09/03/16 1454    Clinical Impression Statement Ms. Strohmeier is a 71 yo female who has chronic  bilateral knee pain.  Her pain exacerbated for unknown reasons in February.  She went to the MD who injected both knees, (this helped her pain approximately 25%), and referred her to physical therapy.  Examination demonstrates decrased activity tolerance, increased pain, decreased ROM, decreased flexibiltiy and decreased strength and difficulty walking.  Ms. Kastens will benefit from skilled physical therapy to address these issues and maximize her functional ability. .   Rehab Potential Good   PT Frequency 2x / week   PT Duration 6 weeks   PT Treatment/Interventions ADLs/Self Care Home Management;Gait  training;Stair training;Functional mobility training;Therapeutic activities;Therapeutic exercise;Balance training;Patient/family education;Manual techniques   PT Next Visit Plan Review evaluation and goals, begin standing terminal extension, gastroc stretch, slant board stretch, gait training emphasising heel toe gait pattern.     Consulted and Agree with Plan of Care Patient      Patient will benefit from skilled therapeutic intervention in order to improve the following deficits and impairments:  Abnormal gait, Decreased activity tolerance, Decreased range of motion, Decreased strength, Difficulty walking, Pain  Visit Diagnosis: Chronic pain of left knee - Plan: PT plan of  care cert/re-cert  Chronic pain of right knee - Plan: PT plan of care cert/re-cert  Stiffness of right knee, not elsewhere classified - Plan: PT plan of care cert/re-cert  Stiffness of left knee, not elsewhere classified - Plan: PT plan of care cert/re-cert  Muscle weakness (generalized) - Plan: PT plan of care cert/re-cert      G-Codes - 81/44/81 1511    Functional Assessment Tool Used (Outpatient Only) foto/ clinical judgement, pain, rom, gait    Functional Limitation Mobility: Walking and moving around   Mobility: Walking and Moving Around Current Status ([id number]) At least 20 percent but less than 40 percent impaired, limited or restricted   Mobility: Walking and Moving Around Goal Status [phone]) At least 20 percent but less than 40 percent impaired, limited or restricted       Problem List Patient Active Problem List   Diagnosis Date Noted  . Breast cancer, left breast ([place]) 11/02/2015  . Difficulty walking 10/20/2013  . Parkinson disease ([place]) 08/24/2013  . Hypothyroid 05/05/2013  . Hypertension 05/05/2013   Rayetta Humphrey, PT CLT [phone] 09/03/2016, 3:17 PM  [facility] [address] [place], [place], [address] Phone: [phone]   Fax:   [phone]  Name: BECKIE VISCARDI MRN: [id number] Date of Birth: 23-Oct-1945

## 2016-09-10 ENCOUNTER — Ambulatory Visit (HOSPITAL_COMMUNITY): Payer: Medicare Other | Admitting: Physical Therapy

## 2016-09-10 DIAGNOSIS — G8929 Other chronic pain: Secondary | ICD-10-CM

## 2016-09-10 DIAGNOSIS — M25562 Pain in left knee: Secondary | ICD-10-CM | POA: Diagnosis not present

## 2016-09-10 DIAGNOSIS — M25661 Stiffness of right knee, not elsewhere classified: Secondary | ICD-10-CM

## 2016-09-10 DIAGNOSIS — M6281 Muscle weakness (generalized): Secondary | ICD-10-CM

## 2016-09-10 DIAGNOSIS — M25662 Stiffness of left knee, not elsewhere classified: Secondary | ICD-10-CM

## 2016-09-10 DIAGNOSIS — M25561 Pain in right knee: Secondary | ICD-10-CM

## 2016-09-10 NOTE — Therapy (Signed)
[place], [place], [address] Phone: [phone]   Fax:  [phone]  Physical Therapy Treatment  Patient Details  Name: Lisa Torres MRN: [id number] Date of Birth: 03/04/1946 Referring Provider: Remo Lipps Case  Encounter Date: 09/10/2016      PT End of Session - 09/10/16 1451    Visit Number 2   Number of Visits 12   Date for PT Re-Evaluation 10/03/16   Authorization Type [place] - Visit Number 2   Authorization - Number of Visits 12   PT Start Time 1194   PT Stop Time 1740   PT Time Calculation (min) 38 min      Past Medical History:  Diagnosis Date  . Breast cancer ([place]) 11/2011  . Chronic back pain   . Chronic leg pain    bilateral  . Chronic leukopenia   . DDD (degenerative disc disease), cervical   . DDD (degenerative disc disease), lumbar   . Gait abnormality   . GERD (gastroesophageal reflux disease)   . Hypertension   . Parkinson's disease ([place])   . Radiation 04/2012   33 treatments for breast cancer  . Right knee pain   . Thyroid disease     Past Surgical History:  Procedure Laterality Date  . ABDOMINAL HYSTERECTOMY     partial  . BREAST LUMPECTOMY Left 12/2011  . CHOLECYSTECTOMY    . COLONOSCOPY N/A 07/01/2013   Procedure: COLONOSCOPY;  Surgeon: Rogene Houston, MD;  [facility];  Service: Endoscopy;  Laterality: N/A;  1030  . TUBAL LIGATION      There were no vitals filed for this visit.      Subjective Assessment - 09/10/16 1447    Subjective Pt comes today without AD.  States she is not having any pain in her knees today.  States she had a little bit in her Rt knee on sunday but none since.  States she's been doing the exercises given to her but unsure if she's doing it right.  States she went to her Silver Sneakers class at the [facility] this morning (general all over exercise class).     Currently in Pain? No/denies                          [facility] Adult PT Treatment/Exercise - 09/10/16 0001      Knee/Hip Exercises: Stretches   Active Hamstring Stretch Both;2 reps;30 seconds   Gastroc Stretch Both;2 reps;20 seconds   Gastroc Stretch Limitations standing runners stretch     Knee/Hip Exercises: Standing   Heel Raises Both;10 reps   Heel Raises Limitations toeraises 10 reps   Gait Training heel to toe gait, wider BOS 225 feet no AD     Knee/Hip Exercises: Seated   Long Arc Quad Both;10 reps   Sit to [employer] 10 reps;without UE support  standard chair with knees apart     Knee/Hip Exercises: Supine   Quad Sets Both;10 reps   Short Arc Quad Sets Both;10 reps   Terminal Knee Extension Both;10 reps                PT Education - 09/10/16 1450    Education provided Yes   Education Details reviewed HEP and goals per initial evaluation.  Given copy of evaluation   Person(s) Educated Patient   Methods Explanation;Demonstration;Tactile cues;Verbal cues;Handout   Comprehension Verbalized understanding;Returned demonstration;Verbal cues required;Tactile cues required;Need  further instruction          PT Short Term Goals - 09/03/16 1500      PT SHORT TERM GOAL #1   Title Pt bilateral knee pain to be no greater than a 5/10 to allow pt to ambulate for up to 30 minutes.     Time 3   Period Weeks   Status New     PT SHORT TERM GOAL #2   Title Pt to be able to sit for 40 mintues without increased  Bilateral knee pain to be able to go to a resaurant and enjoy a meal.    Time 3   Period Weeks   Status New     PT SHORT TERM GOAL #3   Title Pt bilateral   hip extension strength to be increased 1/2 grade to allow pt to come from sit to stand with increased ease.     Time 3   Period Weeks           PT Long Term Goals - 09/03/16 1513      PT LONG TERM GOAL #1   Title Pt bilateral knee pain to be no greater than a 3/10 to allow pt to walk in comfort for up to 40 mintues for  shopping    Time 6   Period Weeks   Status New     PT LONG TERM GOAL #2   Title Pt B hamstring length to have increased 10 degrees to allow decrease knee stress to allow decreased knee pain .    Time 6   Period Weeks   Status New     PT LONG TERM GOAL #3   Title Pt hip extensors to both be at least 3+/5 to allow pt to go up and down steps in a reciprocal manner.    Time 6   Period Weeks   Status New     PT LONG TERM GOAL #4   Title Pt bilateral knee extension to have improved by 10 degrees to allow a more normalized gait to take stress off of knees, hips and back.    Time 6   Period Weeks   Status New     PT LONG TERM GOAL #5   Title Pt to be able to sit for an hour with no increased knee pain to allow pt to feel like she can participate in church services.    Time 6   Period Weeks   Status New     PT LONG TERM GOAL #6   Title Pt to states that she has not had to use her cane in the past week to assist with ambulation    Time 6   Period Weeks   Status New               Plan - 09/10/16 1503    Clinical Impression Statement reveiwed evaluation goals and HEP with pateint.  Pt required verbal and tactile cues to complete all exericses correctly.  Came today without AD; worked on ambulation with heel-toe, wider BOS and more erect posturing.  Added standing heel and toe raises, sit to stand, hip abd/ext exercises per deficits found.  PT only with complaints of Rt knee knee completing quad sets and SAQ.  All others completed without pain, however with noted weakness and tacile cues to utilize correct musculature.     Rehab Potential Good   PT Frequency 2x / week   PT Duration 6 weeks   PT  Treatment/Interventions ADLs/Self Care Home Management;Gait training;Stair training;Functional mobility training;Therapeutic activities;Therapeutic exercise;Balance training;Patient/family education;Manual techniques   PT Next Visit Plan Continue to progress hip strength, LE flexibility,  improved knee extension and normalized gait.      Consulted and Agree with Plan of Care Patient      Patient will benefit from skilled therapeutic intervention in order to improve the following deficits and impairments:  Abnormal gait, Decreased activity tolerance, Decreased range of motion, Decreased strength, Difficulty walking, Pain  Visit Diagnosis: Chronic pain of left knee  Chronic pain of right knee  Stiffness of right knee, not elsewhere classified  Stiffness of left knee, not elsewhere classified  Muscle weakness (generalized)     Problem List Patient Active Problem List   Diagnosis Date Noted  . Breast cancer, left breast ([place]) 11/02/2015  . Difficulty walking 10/20/2013  . Parkinson disease ([place]) 08/24/2013  . Hypothyroid 05/05/2013  . Hypertension 05/05/2013    Teena Irani, PTA/CLT [phone]  09/10/2016, 3:07 PM  [facility] [address] [place], [place], [address] Phone: [phone]   Fax:  [phone]  Name: Lisa Torres MRN: [id number] Date of Birth: 1946-01-09

## 2016-09-12 ENCOUNTER — Encounter (HOSPITAL_COMMUNITY): Payer: Self-pay

## 2016-09-12 ENCOUNTER — Ambulatory Visit (HOSPITAL_COMMUNITY): Payer: Medicare Other

## 2016-09-12 DIAGNOSIS — M25661 Stiffness of right knee, not elsewhere classified: Secondary | ICD-10-CM

## 2016-09-12 DIAGNOSIS — M25662 Stiffness of left knee, not elsewhere classified: Secondary | ICD-10-CM

## 2016-09-12 DIAGNOSIS — G8929 Other chronic pain: Secondary | ICD-10-CM

## 2016-09-12 DIAGNOSIS — M25562 Pain in left knee: Principal | ICD-10-CM

## 2016-09-12 DIAGNOSIS — M25561 Pain in right knee: Secondary | ICD-10-CM

## 2016-09-12 DIAGNOSIS — M6281 Muscle weakness (generalized): Secondary | ICD-10-CM

## 2016-09-12 NOTE — Therapy (Signed)
[place], [place], [address] Phone: [phone]   Fax:  [phone]  Physical Therapy Treatment  Patient Details  Name: Lisa Torres MRN: [id number] Date of Birth: November 22, 1945 Referring Provider: Remo Torres Case  Encounter Date: 09/12/2016      PT End of Session - 09/12/16 1440    Visit Number 3   Number of Visits 12   Date for PT Re-Evaluation 10/03/16   Authorization Type [place] - Visit Number 3   Authorization - Number of Visits 12   PT Start Time 9242   PT Stop Time 6834   PT Time Calculation (min) 42 min   Activity Tolerance Patient tolerated treatment well;No increased pain   Behavior During Therapy WFL for tasks assessed/performed      Past Medical History:  Diagnosis Date  . Breast cancer ([place]) 11/2011  . Chronic back pain   . Chronic leg pain    bilateral  . Chronic leukopenia   . DDD (degenerative disc disease), cervical   . DDD (degenerative disc disease), lumbar   . Gait abnormality   . GERD (gastroesophageal reflux disease)   . Hypertension   . Parkinson's disease ([place])   . Radiation 04/2012   33 treatments for breast cancer  . Right knee pain   . Thyroid disease     Past Surgical History:  Procedure Laterality Date  . ABDOMINAL HYSTERECTOMY     partial  . BREAST LUMPECTOMY Left 12/2011  . CHOLECYSTECTOMY    . COLONOSCOPY N/A 07/01/2013   Procedure: COLONOSCOPY;  Surgeon: Lisa Houston, MD;  [facility];  Service: Endoscopy;  Laterality: N/A;  1030  . TUBAL LIGATION      There were no vitals filed for this visit.      Subjective Assessment - 09/12/16 1439    Subjective Pt states that her R knee has been hurting pretty bad today, she doesn't know why. But, her L knee is feeling pretty good. She reports compliance with HEP and feels like they're helping.   Currently in Pain? Yes   Pain Score 5    Pain Location Knee   Pain Orientation  Right   Pain Descriptors / Indicators Dull   Pain Type Chronic pain             [facility] Adult PT Treatment/Exercise - 09/12/16 0001                               Knee/Hip Exercises: Stretches   Active Hamstring Stretch Both;2 reps;30 seconds   Gastroc Stretch Both;2 reps;30 seconds   Gastroc Stretch Limitations on step with BUE support     Knee/Hip Exercises: Standing   Terminal Knee Extension Limitations TKE with GTB, 2x10 each, increased difficulty with RLE   Gait Training x 1 lap around gym focusing on heel strike, R>L     Knee/Hip Exercises: Supine   Quad Sets Both;2 sets;10 reps  3-5 sec hold each; max tactile cues for R quad   Short Arc [employer] Both;2 sets;10 reps  3 sec hold each            PT Education - 09/12/16 1523    Education provided Yes   Education Details exercise technique, proper heel to toe gait during ambulation, continue current HEP   Person(s) Educated Patient   Methods Explanation;Demonstration;Tactile cues;Verbal cues   Comprehension Verbalized understanding;Returned  demonstration;Need further instruction          PT Short Term Goals - 09/03/16 1500      PT SHORT TERM GOAL #1   Title Pt bilateral knee pain to be no greater than a 5/10 to allow pt to ambulate for up to 30 minutes.     Time 3   Period Weeks   Status New     PT SHORT TERM GOAL #2   Title Pt to be able to sit for 40 mintues without increased  Bilateral knee pain to be able to go to a resaurant and enjoy a meal.    Time 3   Period Weeks   Status New     PT SHORT TERM GOAL #3   Title Pt bilateral   hip extension strength to be increased 1/2 grade to allow pt to come from sit to stand with increased ease.     Time 3   Period Weeks           PT Long Term Goals - 09/03/16 1513      PT LONG TERM GOAL #1   Title Pt bilateral knee pain to be no greater than a 3/10 to allow pt to walk in comfort for up to 40 mintues for shopping    Time 6   Period Weeks    Status New     PT LONG TERM GOAL #2   Title Pt B hamstring length to have increased 10 degrees to allow decrease knee stress to allow decreased knee pain .    Time 6   Period Weeks   Status New     PT LONG TERM GOAL #3   Title Pt hip extensors to both be at least 3+/5 to allow pt to go up and down steps in a reciprocal manner.    Time 6   Period Weeks   Status New     PT LONG TERM GOAL #4   Title Pt bilateral knee extension to have improved by 10 degrees to allow a more normalized gait to take stress off of knees, hips and back.    Time 6   Period Weeks   Status New     PT LONG TERM GOAL #5   Title Pt to be able to sit for an hour with no increased knee pain to allow pt to feel like she can participate in church services.    Time 6   Period Weeks   Status New     PT LONG TERM GOAL #6   Title Pt to states that she has not had to use her cane in the past week to assist with ambulation    Time 6   Period Weeks   Status New               Plan - 09/12/16 1524    Clinical Impression Statement Continued to focus on knee extension and heel to toe gait during session this date. Pt with continued c/o discomfort in R quad during quad sets, SAQ and standing TKE, but she was able to complete all exercises. Pt continues with decreased heel strike and increased knee flexion during gait; vc required for correction and pt stated her knees did not hurt as bad when walking with proper heel to toe gait. Pt with decreased pain to 3-4/10 at EOS compared to 5-6 at beginning.  Continue current POC as planned.   Rehab Potential Good   PT Frequency 2x / week  PT Duration 6 weeks   PT Treatment/Interventions ADLs/Self Care Home Management;Gait training;Stair training;Functional mobility training;Therapeutic activities;Therapeutic exercise;Balance training;Patient/family education;Manual techniques   PT Next Visit Plan Continue to progress hip and knee strength, LE flexibility, improved knee  extension and normalized gait. continue standing TKE; seated HS stretch with OP, side stepping   Consulted and Agree with Plan of Care Patient      Patient will benefit from skilled therapeutic intervention in order to improve the following deficits and impairments:  Abnormal gait, Decreased activity tolerance, Decreased range of motion, Decreased strength, Difficulty walking, Pain  Visit Diagnosis: Chronic pain of left knee  Chronic pain of right knee  Stiffness of right knee, not elsewhere classified  Stiffness of left knee, not elsewhere classified  Muscle weakness (generalized)     Problem List Patient Active Problem List   Diagnosis Date Noted  . Breast cancer, left breast ([place]) 11/02/2015  . Difficulty walking 10/20/2013  . Parkinson disease ([place]) 08/24/2013  . Hypothyroid 05/05/2013  . Hypertension 05/05/2013     Geraldine Solar PT, DPT   [facility] [address] [place], [place], [address] Phone: [phone]   Fax:  [phone]  Name: Lisa Torres MRN: [id number] Date of Birth: Dec 26, 1945

## 2016-09-17 ENCOUNTER — Encounter (HOSPITAL_COMMUNITY): Payer: Self-pay

## 2016-09-17 ENCOUNTER — Ambulatory Visit (HOSPITAL_COMMUNITY): Payer: Medicare Other | Attending: Orthopedic Surgery

## 2016-09-17 DIAGNOSIS — M25561 Pain in right knee: Secondary | ICD-10-CM | POA: Diagnosis present

## 2016-09-17 DIAGNOSIS — M25661 Stiffness of right knee, not elsewhere classified: Secondary | ICD-10-CM

## 2016-09-17 DIAGNOSIS — M6281 Muscle weakness (generalized): Secondary | ICD-10-CM

## 2016-09-17 DIAGNOSIS — R262 Difficulty in walking, not elsewhere classified: Secondary | ICD-10-CM | POA: Insufficient documentation

## 2016-09-17 DIAGNOSIS — M25562 Pain in left knee: Secondary | ICD-10-CM | POA: Insufficient documentation

## 2016-09-17 DIAGNOSIS — M25662 Stiffness of left knee, not elsewhere classified: Secondary | ICD-10-CM

## 2016-09-17 DIAGNOSIS — G8929 Other chronic pain: Secondary | ICD-10-CM | POA: Diagnosis present

## 2016-09-17 NOTE — Therapy (Signed)
[place], [place], [address] Phone: [phone]   Fax:  [phone]  Physical Therapy Treatment  Patient Details  Name: Lisa Torres MRN: [id number] Date of Birth: 1946/02/07 Referring Provider: Remo Lipps Case  Encounter Date: 09/17/2016      PT End of Session - 09/17/16 1440    Visit Number 4   Number of Visits 12   Date for PT Re-Evaluation 10/03/16   Authorization Type [place] - Visit Number 4   Authorization - Number of Visits 12   PT Start Time 6962   PT Stop Time 1520   PT Time Calculation (min) 43 min   Activity Tolerance Patient tolerated treatment well;No increased pain   Behavior During Therapy WFL for tasks assessed/performed      Past Medical History:  Diagnosis Date  . Breast cancer ([place]) 11/2011  . Chronic back pain   . Chronic leg pain    bilateral  . Chronic leukopenia   . DDD (degenerative disc disease), cervical   . DDD (degenerative disc disease), lumbar   . Gait abnormality   . GERD (gastroesophageal reflux disease)   . Hypertension   . Parkinson's disease ([place])   . Radiation 04/2012   33 treatments for breast cancer  . Right knee pain   . Thyroid disease     Past Surgical History:  Procedure Laterality Date  . ABDOMINAL HYSTERECTOMY     partial  . BREAST LUMPECTOMY Left 12/2011  . CHOLECYSTECTOMY    . COLONOSCOPY N/A 07/01/2013   Procedure: COLONOSCOPY;  Surgeon: Rogene Houston, MD;  [facility];  Service: Endoscopy;  Laterality: N/A;  1030  . TUBAL LIGATION      There were no vitals filed for this visit.      Subjective Assessment - 09/17/16 1438    Subjective Pt states that her legs are feeling better today. She states that she has her R knee brace on today and performed her HEP this morning which helped a whole lot.    Currently in Pain? Yes   Pain Score 5    Pain Location Knee   Pain Orientation Right   Pain Descriptors /  Indicators Dull   Pain Type Chronic pain            [facility] Adult PT Treatment/Exercise - 09/17/16 0001      Knee/Hip Exercises: Stretches   Active Hamstring Stretch Both;2 reps;30 seconds   Active Hamstring Stretch Limitations supine with hands behind knee; supine with strap 2x30 each (pt felt more of a stretch with this technique)   Gastroc Stretch Both;3 reps;30 seconds   Gastroc Stretch Limitations slant board     Knee/Hip Exercises: Standing   Terminal Knee Extension Limitations TKE with GTB, 2x10 each, increased difficulty with RLE   Gait Training 68ft x 2 laps focusing on heel strike and improved ankle DF throughout gait on RLE     Knee/Hip Exercises: Seated   Long Arc Quad Both;1 set;10 reps  2-3 sec hold at top     Knee/Hip Exercises: Supine   Quad Sets Both;1 set  10 reps, 2-3 sec hold each   Short Arc [employer] Both;2 sets  10 reps, 2-3 sec hold each     Knee/Hip Exercises: Prone   Hip Extension Limitations [employer] TKE, 2 sets of 10 reps with 2 sec hold each; mod to max cueing for proper quad engagement  PT Education - 09/17/16 1529    Education provided Yes   Education Details exercise technique, proper heel strike to push-off during gait, increase DF on R foot during swing through   [employer]) Educated Patient   Methods Explanation;Demonstration;Tactile cues;Verbal cues   Comprehension Verbalized understanding;Returned demonstration;Need further instruction          PT Short Term Goals - 09/03/16 1500      PT SHORT TERM GOAL #1   Title Pt bilateral knee pain to be no greater than a 5/10 to allow pt to ambulate for up to 30 minutes.     Time 3   Period Weeks   Status New     PT SHORT TERM GOAL #2   Title Pt to be able to sit for 40 mintues without increased  Bilateral knee pain to be able to go to a resaurant and enjoy a meal.    Time 3   Period Weeks   Status New     PT SHORT TERM GOAL #3   Title Pt bilateral   hip extension strength to  be increased 1/2 grade to allow pt to come from sit to stand with increased ease.     Time 3   Period Weeks           PT Long Term Goals - 09/03/16 1513      PT LONG TERM GOAL #1   Title Pt bilateral knee pain to be no greater than a 3/10 to allow pt to walk in comfort for up to 40 mintues for shopping    Time 6   Period Weeks   Status New     PT LONG TERM GOAL #2   Title Pt B hamstring length to have increased 10 degrees to allow decrease knee stress to allow decreased knee pain .    Time 6   Period Weeks   Status New     PT LONG TERM GOAL #3   Title Pt hip extensors to both be at least 3+/5 to allow pt to go up and down steps in a reciprocal manner.    Time 6   Period Weeks   Status New     PT LONG TERM GOAL #4   Title Pt bilateral knee extension to have improved by 10 degrees to allow a more normalized gait to take stress off of knees, hips and back.    Time 6   Period Weeks   Status New     PT LONG TERM GOAL #5   Title Pt to be able to sit for an hour with no increased knee pain to allow pt to feel like she can participate in church services.    Time 6   Period Weeks   Status New     PT LONG TERM GOAL #6   Title Pt to states that she has not had to use her cane in the past week to assist with ambulation    Time 6   Period Weeks   Status New               Plan - 09/17/16 1529    Clinical Impression Statement Knee extension and proper gait mechanics focus of this session. Pt tolerated therex well but continues to demonstrate deficient quad engagement on R compared to L. Pt continues to require cues on proper gait mechanics, though her technique improves with prolonged trials of walking. She demonstrated decreased R ankle DF throughout gait and required cueing to  correct this. Continue POC as planned focusing on improving knee extension and functional strengthening.   Rehab Potential Good   PT Frequency 2x / week   PT Duration 6 weeks   PT  Treatment/Interventions ADLs/Self Care Home Management;Gait training;Stair training;Functional mobility training;Therapeutic activities;Therapeutic exercise;Balance training;Patient/family education;Manual techniques   PT Next Visit Plan Continue to progress hip and knee strength, LE flexibility, improved knee extension and normalized gait. trial seated HS stretch with OP to see if she feels this stretch more, side stepping,    Consulted and Agree with Plan of Care Patient      Patient will benefit from skilled therapeutic intervention in order to improve the following deficits and impairments:  Abnormal gait, Decreased activity tolerance, Decreased range of motion, Decreased strength, Difficulty walking, Pain  Visit Diagnosis: Chronic pain of left knee  Chronic pain of right knee  Stiffness of right knee, not elsewhere classified  Stiffness of left knee, not elsewhere classified  Muscle weakness (generalized)     Problem List Patient Active Problem List   Diagnosis Date Noted  . Breast cancer, left breast ([place]) 11/02/2015  . Difficulty walking 10/20/2013  . Parkinson disease ([place]) 08/24/2013  . Hypothyroid 05/05/2013  . Hypertension 05/05/2013     Geraldine Solar PT, DPT   [facility] [address] [place], [place], [address] Phone: [phone]   Fax:  [phone]  Name: Lisa Torres MRN: [id number] Date of Birth: 1946/04/08

## 2016-09-19 ENCOUNTER — Ambulatory Visit (HOSPITAL_COMMUNITY): Payer: Medicare Other | Admitting: Physical Therapy

## 2016-09-19 DIAGNOSIS — M25561 Pain in right knee: Secondary | ICD-10-CM

## 2016-09-19 DIAGNOSIS — M25562 Pain in left knee: Principal | ICD-10-CM

## 2016-09-19 DIAGNOSIS — M25662 Stiffness of left knee, not elsewhere classified: Secondary | ICD-10-CM

## 2016-09-19 DIAGNOSIS — M25661 Stiffness of right knee, not elsewhere classified: Secondary | ICD-10-CM

## 2016-09-19 DIAGNOSIS — G8929 Other chronic pain: Secondary | ICD-10-CM

## 2016-09-19 DIAGNOSIS — M6281 Muscle weakness (generalized): Secondary | ICD-10-CM

## 2016-09-19 NOTE — Therapy (Signed)
[place], [place], [address] Phone: [phone]   Fax:  [phone]  Physical Therapy Treatment  Patient Details  Name: Lisa Torres MRN: [id number] Date of Birth: 1945-10-30 Referring Provider: Remo Lipps Case  Encounter Date: 09/19/2016      PT End of Session - 09/19/16 1444    Visit Number 5   Number of Visits 12   Date for PT Re-Evaluation 10/03/16   Authorization Type [employer] - Visit Number 5   Authorization - Number of Visits 12   PT Start Time 2876   PT Stop Time 1516   PT Time Calculation (min) 39 min   Activity Tolerance Patient tolerated treatment well;No increased pain   Behavior During Therapy WFL for tasks assessed/performed      Past Medical History:  Diagnosis Date  . Breast cancer ([place]) 11/2011  . Chronic back pain   . Chronic leg pain    bilateral  . Chronic leukopenia   . DDD (degenerative disc disease), cervical   . DDD (degenerative disc disease), lumbar   . Gait abnormality   . GERD (gastroesophageal reflux disease)   . Hypertension   . Parkinson's disease ([place])   . Radiation 04/2012   33 treatments for breast cancer  . Right knee pain   . Thyroid disease     Past Surgical History:  Procedure Laterality Date  . ABDOMINAL HYSTERECTOMY     partial  . BREAST LUMPECTOMY Left 12/2011  . CHOLECYSTECTOMY    . COLONOSCOPY N/A 07/01/2013   Procedure: COLONOSCOPY;  Surgeon: Rogene Houston, MD;  [facility];  Service: Endoscopy;  Laterality: N/A;  1030  . TUBAL LIGATION      There were no vitals filed for this visit.      Subjective Assessment - 09/19/16 1435    Subjective [name] states that her her right knee is really bothering her today and she is unsure why.    Pertinent History HTN, Cancer, cervical and lumbar pain,    How long can you sit comfortably? 30 minutes    How long can you stand comfortably? 15 minutes   How long can you  walk comfortably? 15 minutes   Patient Stated Goals less pain, be able to walk longer    Currently in Pain? Yes   Pain Score 5    Pain Location Knee   Pain Orientation Right;Anterior   Pain Descriptors / Indicators Aching;Dull   Pain Type Chronic pain   Pain Onset More than a month ago   Pain Frequency Intermittent   Aggravating Factors  not sure    Pain Relieving Factors ice   Effect of Pain on Daily Activities not sure                         [facility] Adult PT Treatment/Exercise - 09/19/16 0001      Knee/Hip Exercises: Stretches   Active Hamstring Stretch Both;3 reps;30 seconds   Active Hamstring Stretch Limitations supine with hands behind knee; supine with strap 2x30 each (pt felt more of a stretch with this technique)   Quad Stretch --   Hip Flexor Stretch Both;1 rep;60 seconds   Hip Flexor Stretch Limitations supine with leg off the edge      Knee/Hip Exercises: Seated   Long Arc Quad Both;1 set;10 reps  2-3 sec hold at top   [employer] 3 lbs.  Knee/Hip Exercises: Supine   Quad Sets Both;1 set  10 reps, 2-3 sec hold each   Short Arc Quad Sets Both;1 set;10 reps  10 reps, 2-3 sec hold each   Short Arc Quad Sets Limitations 3#   Terminal Knee Extension Both;10 reps   Bridges with [employer] 10 reps     Knee/Hip Exercises: Sidelying   Hip ABduction Both;10 reps     Knee/Hip Exercises: Prone   Hamstring Curl 10 reps;Limitations   Hamstring Curl Limitations 3# B   Hip Extension Both;10 reps   Hip Extension Limitations Prone TKE, 1 sets of 10 reps with 2 sec hold each; mod to max cueing for proper quad engagement                  PT Short Term Goals - 09/19/16 1451      PT SHORT TERM GOAL #1   Title Pt bilateral knee pain to be no greater than a 5/10 to allow pt to ambulate for up to 30 minutes.     Time 3   Period Weeks   Status On-going     PT SHORT TERM GOAL #2   Title Pt to be able to sit for 40 mintues without  increased  Bilateral knee pain to be able to go to a resaurant and enjoy a meal.    Time 3   Period Weeks   Status On-going     PT SHORT TERM GOAL #3   Title Pt bilateral   hip extension strength to be increased 1/2 grade to allow pt to come from sit to stand with increased ease.     Time 3   Period Weeks   Status On-going           PT Long Term Goals - 09/19/16 1451      PT LONG TERM GOAL #1   Title Pt bilateral knee pain to be no greater than a 3/10 to allow pt to walk in comfort for up to 40 mintues for shopping    Time 6   Period Weeks   Status On-going     PT LONG TERM GOAL #2   Title Pt B hamstring length to have increased 10 degrees to allow decrease knee stress to allow decreased knee pain .    Time 6   Period Weeks   Status On-going     PT LONG TERM GOAL #3   Title Pt hip extensors to both be at least 3+/5 to allow pt to go up and down steps in a reciprocal manner.    Time 6   Period Weeks   Status On-going     PT LONG TERM GOAL #4   Title Pt bilateral knee extension to have improved by 10 degrees to allow a more normalized gait to take stress off of knees, hips and back.    Time 6   Period Weeks   Status On-going     PT LONG TERM GOAL #5   Title Pt to be able to sit for an hour with no increased knee pain to allow pt to feel like she can participate in church services.    Time 6   Period Weeks   Status On-going     PT LONG TERM GOAL #6   Title Pt to states that she has not had to use her cane in the past week to assist with ambulation    Time 6   Period Weeks   Status On-going  Plan - 09/19/16 1445    Clinical Impression Statement Pt remained nonweight exercises due to pain level.  Pt unable to complete full range with 3# on SAQ for Rt LE therefore no wt on RT.  Added bridging,  hip flexor and quadricep stretches to regieme.    Rehab Potential Good   PT Frequency 2x / week   PT Duration 6 weeks   PT Treatment/Interventions  ADLs/Self Care Home Management;Gait training;Stair training;Functional mobility training;Therapeutic activities;Therapeutic exercise;Balance training;Patient/family education;Manual techniques   PT Next Visit Plan  Progress hip and knee strength, LE flexibility, improved knee extension and normalized gait. , side stepping,    Consulted and Agree with Plan of Care Patient      Patient will benefit from skilled therapeutic intervention in order to improve the following deficits and impairments:  Abnormal gait, Decreased activity tolerance, Decreased range of motion, Decreased strength, Difficulty walking, Pain  Visit Diagnosis: Chronic pain of left knee  Chronic pain of right knee  Stiffness of right knee, not elsewhere classified  Stiffness of left knee, not elsewhere classified  Muscle weakness (generalized)     Problem List Patient Active Problem List   Diagnosis Date Noted  . Breast cancer, left breast ([place]) 11/02/2015  . Difficulty walking 10/20/2013  . Parkinson disease ([place]) 08/24/2013  . Hypothyroid 05/05/2013  . Hypertension 05/05/2013    Rayetta Humphrey, PT CLT [phone] 09/19/2016, 3:15 PM  [facility] [address] [place], [place], [address] Phone: [phone]   Fax:  [phone]  Name: Lisa Torres MRN: [id number] Date of Birth: 04/15/1946

## 2016-09-24 ENCOUNTER — Ambulatory Visit (HOSPITAL_COMMUNITY): Payer: Medicare Other | Admitting: Physical Therapy

## 2016-09-24 DIAGNOSIS — M25662 Stiffness of left knee, not elsewhere classified: Secondary | ICD-10-CM

## 2016-09-24 DIAGNOSIS — M25562 Pain in left knee: Secondary | ICD-10-CM | POA: Diagnosis not present

## 2016-09-24 DIAGNOSIS — G8929 Other chronic pain: Secondary | ICD-10-CM

## 2016-09-24 DIAGNOSIS — M25561 Pain in right knee: Secondary | ICD-10-CM

## 2016-09-24 DIAGNOSIS — M6281 Muscle weakness (generalized): Secondary | ICD-10-CM

## 2016-09-24 DIAGNOSIS — M25661 Stiffness of right knee, not elsewhere classified: Secondary | ICD-10-CM

## 2016-09-24 NOTE — Therapy (Signed)
[place], [place], [address] Phone: [phone]   Fax:  [phone]  Physical Therapy Treatment  Patient Details  Name: Lisa Torres MRN: [id number] Date of Birth: 30-Jun-1945 Referring Provider: Remo Lipps Case  Encounter Date: 09/24/2016      PT End of Session - 09/24/16 1511    Visit Number 6   Number of Visits 12   Date for PT Re-Evaluation 10/03/16   Authorization Type [employer] - Visit Number 6   Authorization - Number of Visits 12   PT Start Time 6789   PT Stop Time 3810   PT Time Calculation (min) 38 min   Activity Tolerance Patient tolerated treatment well;No increased pain   Behavior During Therapy WFL for tasks assessed/performed      Past Medical History:  Diagnosis Date  . Breast cancer ([place] [place]) 11/2011  . Chronic back pain   . Chronic leg pain    bilateral  . Chronic leukopenia   . DDD (degenerative disc disease), cervical   . DDD (degenerative disc disease), lumbar   . Gait abnormality   . GERD (gastroesophageal reflux disease)   . Hypertension   . Parkinson's disease ([place])   . Radiation 04/2012   33 treatments for breast cancer  . Right knee pain   . Thyroid disease     Past Surgical History:  Procedure Laterality Date  . ABDOMINAL HYSTERECTOMY     partial  . BREAST LUMPECTOMY Left 12/2011  . CHOLECYSTECTOMY    . COLONOSCOPY N/A 07/01/2013   Procedure: COLONOSCOPY;  Surgeon: Rogene Houston, MD;  [facility];  Service: Endoscopy;  Laterality: N/A;  1030  . TUBAL LIGATION      There were no vitals filed for this visit.      Subjective Assessment - 09/24/16 1442    Subjective Pt states her Rt knee is killing her today, reporting 7/10 pain.  STates she is wearing a knee brace on it that seems to help a little.   Currently in Pain? Yes   Pain Score 7    Pain Location Knee   Pain Orientation Right;Anterior;Posterior   Pain Descriptors /  Indicators Aching   Pain Frequency Intermittent   Aggravating Factors  weight bearing                         [facility] Adult PT Treatment/Exercise - 09/24/16 0001      Knee/Hip Exercises: Stretches   Active Hamstring Stretch Both;3 reps;30 seconds   Active Hamstring Stretch Limitations supine with hands behind knee; supine with strap 2x30 each (pt felt more of a stretch with this technique)   Hip Flexor Stretch Both;60 seconds;2 reps   Hip Flexor Stretch Limitations supine with leg off the edge      Knee/Hip Exercises: Seated   Long Arc Quad Both;10 reps;2 sets   [employer] Weight 3 lbs.   Long Arc [employer] Limitations weight on Lt only     Knee/Hip Exercises: Supine   Oceanographer sets   Short Arc [employer] Both;1 set;10 reps   Short Arc Quad Sets Limitations 3#on Lt , 0# Rt   Bridges with [employer] 2 sets;10 reps                  PT Short Term Goals - 09/19/16 1451      PT SHORT TERM GOAL #1   Title  Pt bilateral knee pain to be no greater than a 5/10 to allow pt to ambulate for up to 30 minutes.     Time 3   Period Weeks   Status On-going     PT SHORT TERM GOAL #2   Title Pt to be able to sit for 40 mintues without increased  Bilateral knee pain to be able to go to a resaurant and enjoy a meal.    Time 3   Period Weeks   Status On-going     PT SHORT TERM GOAL #3   Title Pt bilateral   hip extension strength to be increased 1/2 grade to allow pt to come from sit to stand with increased ease.     Time 3   Period Weeks   Status On-going           PT Long Term Goals - 09/19/16 1451      PT LONG TERM GOAL #1   Title Pt bilateral knee pain to be no greater than a 3/10 to allow pt to walk in comfort for up to 40 mintues for shopping    Time 6   Period Weeks   Status On-going     PT LONG TERM GOAL #2   Title Pt B hamstring length to have increased 10 degrees to allow decrease knee stress to allow decreased knee pain .    Time 6    Period Weeks   Status On-going     PT LONG TERM GOAL #3   Title Pt hip extensors to both be at least 3+/5 to allow pt to go up and down steps in a reciprocal manner.    Time 6   Period Weeks   Status On-going     PT LONG TERM GOAL #4   Title Pt bilateral knee extension to have improved by 10 degrees to allow a more normalized gait to take stress off of knees, hips and back.    Time 6   Period Weeks   Status On-going     PT LONG TERM GOAL #5   Title Pt to be able to sit for an hour with no increased knee pain to allow pt to feel like she can participate in church services.    Time 6   Period Weeks   Status On-going     PT LONG TERM GOAL #6   Title Pt to states that she has not had to use her cane in the past week to assist with ambulation    Time 6   Period Weeks   Status On-going               Plan - 09/24/16 1511    Clinical Impression Statement Contineud with non weight bearing therex due to high level and antalgic gait.  Added additional set of exercises in supine per established program.     Rehab Potential Good   PT Frequency 2x / week   PT Duration 6 weeks   PT Treatment/Interventions ADLs/Self Care Home Management;Gait training;Stair training;Functional mobility training;Therapeutic activities;Therapeutic exercise;Balance training;Patient/family education;Manual techniques   PT Next Visit Plan  Progress hip and knee strength as able per pain tolerance.  When pain lower, progress to normalized gait and stability activities.    Consulted and Agree with Plan of Care Patient      Patient will benefit from skilled therapeutic intervention in order to improve the following deficits and impairments:  Abnormal gait, Decreased activity tolerance, Decreased range of motion, Decreased  strength, Difficulty walking, Pain  Visit Diagnosis: Chronic pain of left knee  Chronic pain of right knee  Stiffness of right knee, not elsewhere classified  Stiffness of left knee,  not elsewhere classified  Muscle weakness (generalized)     Problem List Patient Active Problem List   Diagnosis Date Noted  . Breast cancer, left breast ([place]) 11/02/2015  . Difficulty walking 10/20/2013  . Parkinson disease ([place]) 08/24/2013  . Hypothyroid 05/05/2013  . Hypertension 05/05/2013    Teena Irani, PTA/CLT [phone]  09/24/2016, 3:15 PM  [facility] [address] [place], [place], [address] Phone: [phone]   Fax:  [phone]  Name: Lisa Torres MRN: [id number] Date of Birth: 10-08-1945

## 2016-09-26 ENCOUNTER — Ambulatory Visit (HOSPITAL_COMMUNITY): Payer: Medicare Other | Admitting: Physical Therapy

## 2016-09-26 DIAGNOSIS — M25562 Pain in left knee: Principal | ICD-10-CM

## 2016-09-26 DIAGNOSIS — M6281 Muscle weakness (generalized): Secondary | ICD-10-CM

## 2016-09-26 DIAGNOSIS — M25561 Pain in right knee: Secondary | ICD-10-CM

## 2016-09-26 DIAGNOSIS — M25661 Stiffness of right knee, not elsewhere classified: Secondary | ICD-10-CM

## 2016-09-26 DIAGNOSIS — M25662 Stiffness of left knee, not elsewhere classified: Secondary | ICD-10-CM

## 2016-09-26 DIAGNOSIS — G8929 Other chronic pain: Secondary | ICD-10-CM

## 2016-09-26 NOTE — Therapy (Signed)
[facility], [place], [address] Phone: [phone]   Fax:  [phone]  Physical Therapy Treatment  Patient Details  Name: Lisa Torres MRN: [id number] Date of Birth: [birth year] Referring Provider: Remo Lipps Case  Encounter Date: 09/26/2016      PT End of Session - 09/26/16 1430    Visit Number 7   Number of Visits 12   Date for PT Re-Evaluation 10/03/16   Authorization Type [employer] medicare   Authorization - Visit Number 7   Authorization - Number of Visits 12   PT Start Time 9924   PT Stop Time 2683   PT Time Calculation (min) 38 min   Activity Tolerance Patient tolerated treatment well;No increased pain   Behavior During Therapy WFL for tasks assessed/performed      Past Medical History:  Diagnosis Date  . Breast cancer ([place]) 11/2011  . Chronic back pain   . Chronic leg pain    bilateral  . Chronic leukopenia   . DDD (degenerative disc disease), cervical   . DDD (degenerative disc disease), lumbar   . Gait abnormality   . GERD (gastroesophageal reflux disease)   . Hypertension   . Parkinson's disease ([place])   . Radiation 04/2012   33 treatments for breast cancer  . Right knee pain   . Thyroid disease     Past Surgical History:  Procedure Laterality Date  . ABDOMINAL HYSTERECTOMY     partial  . BREAST LUMPECTOMY Left 12/2011  . CHOLECYSTECTOMY    . COLONOSCOPY N/A 07/01/2013   Procedure: COLONOSCOPY;  Surgeon: Rogene Houston, MD;  [facility];  Service: Endoscopy;  Laterality: N/A;  1030  . TUBAL LIGATION      There were no vitals filed for this visit.      Subjective Assessment - 09/26/16 1357    Subjective PT states that her right knee still bothers her but she is not having any pain with her left knee.  The only thing that is getting easier for her at this time is stooping to pick items off the floor.    Pertinent History HTN, Cancer, cervical and lumbar pain,    How long  can you sit comfortably? 30 minutes    How long can you stand comfortably? 15 minutes   How long can you walk comfortably? 15 minutes   Patient Stated Goals less pain, be able to walk longer    Pain Score 5    Pain Location Knee   Pain Orientation Right   Pain Descriptors / Indicators Dull   Pain Type Chronic pain   Pain Onset More than a month ago   Pain Frequency Constant   Aggravating Factors  weight bearing    Pain Relieving Factors ice    Effect of Pain on Daily Activities increases                          [facility] Adult PT Treatment/Exercise - 09/26/16 0001      Ambulation/Gait   Ambulation/Gait Yes   Ambulation/Gait Assistance 6: Modified independent (Device/Increase time)   Ambulation Distance (Feet) 500 Feet     Knee/Hip Exercises: Stretches   Passive Hamstring Stretch Both;1 rep;60 seconds   Passive Hamstring Stretch Limitations long sitting ;PROM x 60 seconds B at end of session    Hip Flexor Stretch Both;60 seconds;2 reps   Hip Flexor Stretch Limitations supine with leg off the edge  Other Knee/Hip Stretches Trunk extension for hip flexor stretch      Knee/Hip Exercises: Standing   Heel Raises Both;15 reps   Functional Squat 10 reps     Knee/Hip Exercises: Seated   Long Arc Quad --   Long Arc [employer] --   Long Arc [employer] Limitations --     Knee/Hip Exercises: Supine   [employer] Both;15 reps   Short Arc [employer] Both;1 set;15 reps   Short Arc Quad Sets Limitations 4#   Bridges with [employer] 15 reps                PT Education - 09/26/16 1429    Education provided Yes   Education Details new stretches and the importance of picking up her feet when walking; hitting with her heel and pushing off with her toe.    Person(s) Educated Patient   Methods Explanation;Verbal cues;Demonstration;Handout   Comprehension Returned demonstration;Need further instruction          PT Short Term Goals - 09/19/16 1451      PT SHORT  TERM GOAL #1   Title Pt bilateral knee pain to be no greater than a 5/10 to allow pt to ambulate for up to 30 minutes.     Time 3   Period Weeks   Status On-going     PT SHORT TERM GOAL #2   Title Pt to be able to sit for 40 mintues without increased  Bilateral knee pain to be able to go to a resaurant and enjoy a meal.    Time 3   Period Weeks   Status On-going     PT SHORT TERM GOAL #3   Title Pt bilateral   hip extension strength to be increased 1/2 grade to allow pt to come from sit to stand with increased ease.     Time 3   Period Weeks   Status On-going           PT Long Term Goals - 09/19/16 1451      PT LONG TERM GOAL #1   Title Pt bilateral knee pain to be no greater than a 3/10 to allow pt to walk in comfort for up to 40 mintues for shopping    Time 6   Period Weeks   Status On-going     PT LONG TERM GOAL #2   Title Pt B hamstring length to have increased 10 degrees to allow decrease knee stress to allow decreased knee pain .    Time 6   Period Weeks   Status On-going     PT LONG TERM GOAL #3   Title Pt hip extensors to both be at least 3+/5 to allow pt to go up and down steps in a reciprocal manner.    Time 6   Period Weeks   Status On-going     PT LONG TERM GOAL #4   Title Pt bilateral knee extension to have improved by 10 degrees to allow a more normalized gait to take stress off of knees, hips and back.    Time 6   Period Weeks   Status On-going     PT LONG TERM GOAL #5   Title Pt to be able to sit for an hour with no increased knee pain to allow pt to feel like she can participate in church services.    Time 6   Period Weeks   Status On-going     PT LONG TERM GOAL #  6   Title Pt to states that she has not had to use her cane in the past week to assist with ambulation    Time 6   Period Weeks   Status On-going               Plan - 09/26/16 1431    Clinical Impression Statement Treatment focused on stretching and gait training.  Pt  continues to have very tight hip flexors and hamstrings causing increased stress on her knee.  Pt does not pick up her feet when ambulating and walks flat footed.  Therapist instructed pt and worked with pt on heel toe gait.   Rehab Potential Good   PT Frequency 2x / week   PT Duration 6 weeks   PT Treatment/Interventions ADLs/Self Care Home Management;Gait training;Stair training;Functional mobility training;Therapeutic activities;Therapeutic exercise;Balance training;Patient/family education;Manual techniques   PT Next Visit Plan Focus on ROM and  normalized gait and stability activities.    Consulted and Agree with Plan of Care Patient      Patient will benefit from skilled therapeutic intervention in order to improve the following deficits and impairments:  Abnormal gait, Decreased activity tolerance, Decreased range of motion, Decreased strength, Difficulty walking, Pain  Visit Diagnosis: Chronic pain of left knee  Chronic pain of right knee  Stiffness of right knee, not elsewhere classified  Stiffness of left knee, not elsewhere classified  Muscle weakness (generalized)     Problem List Patient Active Problem List   Diagnosis Date Noted  . Breast cancer, left breast ([place]) 11/02/2015  . Difficulty walking 10/20/2013  . Parkinson disease ([place] [facility]) 08/24/2013  . Hypothyroid 05/05/2013  . Hypertension 05/05/2013    Rayetta Humphrey, PT CLT [phone] 09/26/2016, 2:34 PM  [facility] [address] [place], [place], [address] Phone: [phone]   Fax:  [phone]  Name: Lisa Torres MRN: [id number] Date of Birth: 03/14/1946

## 2016-09-26 NOTE — Patient Instructions (Addendum)
Quads / HF, Supine    Lie near edge of bed, one leg bent, foot flat on bed. Other leg hanging over edge, relaxed, thigh resting entirely on bed. Bend hanging knee backward keeping thigh in contact with bed. Hold _60__ seconds.  Repeat _2__ times per session. Do __1_ sessions per day.  Copyright  VHI. All rights reserved.  Sitting: Unilateral   Sitting on the bed.   Sit with one leg straight, other bent on the floor. Pull your toes towards you. Keep back straight. Hold _60__ seconds. Repeat ___2 times per session. Do2 ___ sessions per day.  Copyright  VHI. All rights reserved.  Backward Bend (Standing)    Arch backward to make hollow of back deeper. Hold __3__ seconds. Repeat __10__ times per set. Do _1___ sets per session. Do 4____ sessions per day.  http://orth.exer.us/178   Copyright  VHI. All rights reserved.

## 2016-10-01 ENCOUNTER — Ambulatory Visit (HOSPITAL_COMMUNITY): Payer: Medicare Other | Admitting: Physical Therapy

## 2016-10-01 DIAGNOSIS — M25562 Pain in left knee: Secondary | ICD-10-CM | POA: Diagnosis not present

## 2016-10-01 DIAGNOSIS — M25561 Pain in right knee: Secondary | ICD-10-CM

## 2016-10-01 DIAGNOSIS — M25662 Stiffness of left knee, not elsewhere classified: Secondary | ICD-10-CM

## 2016-10-01 DIAGNOSIS — M25661 Stiffness of right knee, not elsewhere classified: Secondary | ICD-10-CM

## 2016-10-01 DIAGNOSIS — G8929 Other chronic pain: Secondary | ICD-10-CM

## 2016-10-01 DIAGNOSIS — M6281 Muscle weakness (generalized): Secondary | ICD-10-CM

## 2016-10-01 NOTE — Therapy (Signed)
[facility] [place], [place], [address] Phone: [phone]   Fax:  [phone]  Physical Therapy Treatment  Patient Details  Name: Lisa Torres MRN: [id number] Date of Birth: 03/14/1946 Referring Provider: Remo Lipps Case  Encounter Date: 10/01/2016      PT End of Session - 10/01/16 1527    Visit Number 8   Number of Visits 12   Date for PT Re-Evaluation 10/03/16   Authorization Type [place] - Visit Number 8   Authorization - Number of Visits 12   PT Start Time 7824   PT Stop Time 1520   PT Time Calculation (min) 45 min   Activity Tolerance Patient tolerated treatment well;No increased pain   Behavior During Therapy WFL for tasks assessed/performed      Past Medical History:  Diagnosis Date  . Breast cancer ([place]) 11/2011  . Chronic back pain   . Chronic leg pain    bilateral  . Chronic leukopenia   . DDD (degenerative disc disease), cervical   . DDD (degenerative disc disease), lumbar   . Gait abnormality   . GERD (gastroesophageal reflux disease)   . Hypertension   . Parkinson's disease ([place])   . Radiation 04/2012   33 treatments for breast cancer  . Right knee pain   . Thyroid disease     Past Surgical History:  Procedure Laterality Date  . ABDOMINAL HYSTERECTOMY     partial  . BREAST LUMPECTOMY Left 12/2011  . CHOLECYSTECTOMY    . COLONOSCOPY N/A 07/01/2013   Procedure: COLONOSCOPY;  Surgeon: Rogene Houston, MD;  [facility];  Service: Endoscopy;  Laterality: N/A;  1030  . TUBAL LIGATION      There were no vitals filed for this visit.      Subjective Assessment - 10/01/16 1443    Subjective Pt states that she did some but not all of her exercises over the weekend due to being busy.    Pertinent History HTN, Cancer, cervical and lumbar pain,    How long can you sit comfortably? 30 minutes    How long can you stand comfortably? 15 minutes   How long can you  walk comfortably? 15 minutes   Patient Stated Goals less pain, be able to walk longer    Pain Score 5    Pain Location Knee   Pain Orientation Right   Pain Descriptors / Indicators Aching   Pain Onset More than a month ago   Pain Frequency Constant   Aggravating Factors  going from sit to stand;steps    Pain Relieving Factors exercises   Effect of Pain on Daily Activities increases                         [facility] Adult PT Treatment/Exercise - 10/01/16 0001      Ambulation/Gait   Gait Comments in // concentrating on heel toe gait mechanics      Knee/Hip Exercises: Stretches   Active Hamstring Stretch Both;3 reps;30 seconds   Active Hamstring Stretch Limitations supine with hands behind knee; supine with strap 2x30 each (pt felt more of a stretch with this technique)   Passive Hamstring Stretch Both;1 rep;60 seconds   Passive Hamstring Stretch Limitations long sitting ;PROM x 60 seconds B at end of session    Other Knee/Hip Stretches Trunk extension for hip flexor stretch      Knee/Hip Exercises: Standing  Heel Raises Both;10 reps   Heel Raises Limitations toe raises    Terminal Knee Extension Limitations x10   Lateral Step Up Both;10 reps   Forward Step Up Both;10 reps   Functional Squat 10 reps     Knee/Hip Exercises: Supine   Quad Sets Both;15 reps   Short Arc Quad Sets Both;1 set;15 reps                  PT Short Term Goals - 09/19/16 1451      PT SHORT TERM GOAL #1   Title Pt bilateral knee pain to be no greater than a 5/10 to allow pt to ambulate for up to 30 minutes.     Time 3   Period Weeks   Status On-going     PT SHORT TERM GOAL #2   Title Pt to be able to sit for 40 mintues without increased  Bilateral knee pain to be able to go to a resaurant and enjoy a meal.    Time 3   Period Weeks   Status On-going     PT SHORT TERM GOAL #3   Title Pt bilateral   hip extension strength to be increased 1/2 grade to allow pt to come from sit  to stand with increased ease.     Time 3   Period Weeks   Status On-going           PT Long Term Goals - 09/19/16 1451      PT LONG TERM GOAL #1   Title Pt bilateral knee pain to be no greater than a 3/10 to allow pt to walk in comfort for up to 40 mintues for shopping    Time 6   Period Weeks   Status On-going     PT LONG TERM GOAL #2   Title Pt B hamstring length to have increased 10 degrees to allow decrease knee stress to allow decreased knee pain .    Time 6   Period Weeks   Status On-going     PT LONG TERM GOAL #3   Title Pt hip extensors to both be at least 3+/5 to allow pt to go up and down steps in a reciprocal manner.    Time 6   Period Weeks   Status On-going     PT LONG TERM GOAL #4   Title Pt bilateral knee extension to have improved by 10 degrees to allow a more normalized gait to take stress off of knees, hips and back.    Time 6   Period Weeks   Status On-going     PT LONG TERM GOAL #5   Title Pt to be able to sit for an hour with no increased knee pain to allow pt to feel like she can participate in church services.    Time 6   Period Weeks   Status On-going     PT LONG TERM GOAL #6   Title Pt to states that she has not had to use her cane in the past week to assist with ambulation    Time 6   Period Weeks   Status On-going               Plan - 10/01/16 1527    Clinical Impression Statement Added terminal extension and step ups to pt program.  Pt still needs constant cuing to complete heel toe gait as well as to stand erect while walking and not forward bent.  Rehab Potential Good   PT Frequency 2x / week   PT Duration 6 weeks   PT Treatment/Interventions ADLs/Self Care Home Management;Gait training;Stair training;Functional mobility training;Therapeutic activities;Therapeutic exercise;Balance training;Patient/family education;Manual techniques   PT Next Visit Plan Therapist will need to continue to focus on ambulation 1) walking  erect; 2)increasing stride length; 3) heel toe and not flat footed.    Consulted and Agree with Plan of Care Patient      Patient will benefit from skilled therapeutic intervention in order to improve the following deficits and impairments:  Abnormal gait, Decreased activity tolerance, Decreased range of motion, Decreased strength, Difficulty walking, Pain  Visit Diagnosis: Chronic pain of left knee  Chronic pain of right knee  Stiffness of right knee, not elsewhere classified  Stiffness of left knee, not elsewhere classified  Muscle weakness (generalized)     Problem List Patient Active Problem List   Diagnosis Date Noted  . Breast cancer, left breast ([place]) 11/02/2015  . Difficulty walking 10/20/2013  . Parkinson disease ([place]) 08/24/2013  . Hypothyroid 05/05/2013  . Hypertension 05/05/2013    Rayetta Humphrey, PT CLT [phone] 10/01/2016, 3:32 PM  [facility] [address] [place], [place], [address] Phone: [phone]   Fax:  [phone]  Name: AVALEEN BROWNLEY MRN: [id number] Date of Birth: 28-Sep-1945

## 2016-10-03 ENCOUNTER — Ambulatory Visit (HOSPITAL_COMMUNITY): Payer: Medicare Other | Admitting: Physical Therapy

## 2016-10-03 DIAGNOSIS — M6281 Muscle weakness (generalized): Secondary | ICD-10-CM

## 2016-10-03 DIAGNOSIS — M25562 Pain in left knee: Secondary | ICD-10-CM | POA: Diagnosis not present

## 2016-10-03 DIAGNOSIS — G8929 Other chronic pain: Secondary | ICD-10-CM

## 2016-10-03 DIAGNOSIS — M25661 Stiffness of right knee, not elsewhere classified: Secondary | ICD-10-CM

## 2016-10-03 DIAGNOSIS — M25662 Stiffness of left knee, not elsewhere classified: Secondary | ICD-10-CM

## 2016-10-03 DIAGNOSIS — M25561 Pain in right knee: Principal | ICD-10-CM

## 2016-10-03 NOTE — Therapy (Signed)
[facility] [address] [place], [place], [address] Phone: [phone]   Fax:  [phone]  Physical Therapy Treatment  Patient Details  Name: Lisa Torres MRN: [id number] Date of Birth: 11/10/1945 Referring Provider: Remo Lipps Case   Encounter Date: 10/03/2016    Past Medical History:  Diagnosis Date  . Breast cancer ([facility]) 11/2011  . Chronic back pain   . Chronic leg pain    bilateral  . Chronic leukopenia   . DDD (degenerative disc disease), cervical   . DDD (degenerative disc disease), lumbar   . Gait abnormality   . GERD (gastroesophageal reflux disease)   . Hypertension   . Parkinson's disease ([facility])   . Radiation 04/2012   33 treatments for breast cancer  . Right knee pain   . Thyroid disease     Past Surgical History:  Procedure Laterality Date  . ABDOMINAL HYSTERECTOMY     partial  . BREAST LUMPECTOMY Left 12/2011  . CHOLECYSTECTOMY    . COLONOSCOPY N/A 07/01/2013   Procedure: COLONOSCOPY;  Surgeon: Rogene Houston, MD;  [facility];  Service: Endoscopy;  Laterality: N/A;  1030  . TUBAL LIGATION      There were no vitals filed for this visit.      Subjective Assessment - 10/03/16 1438    Subjective Lisa Torres states that her left knee is doing fine but her right knee is sore and aching.     Pertinent History HTN, Cancer, cervical and lumbar pain,    How long can you sit comfortably? 10-15 minutes was 30 minutes    How long can you stand comfortably? 30 minutes was 15 minutes   How long can you walk comfortably? 30 minutes was 15 minutes   Patient Stated Goals less pain, be able to walk longer    Currently in Pain? Yes   Pain Score 5    Pain Location Knee   Pain Orientation Right;Anterior   Pain Descriptors / Indicators Aching;Dull   Pain Type Chronic pain   Pain Onset More than a month ago   Pain Frequency Constant   Aggravating Factors  steps    Pain Relieving Factors heat    Effect of  Pain on Daily Activities increases   Multiple Pain Sites No            [facility] PT Assessment - 10/03/16 0001      Assessment   Medical Diagnosis B knee pain    Referring Provider Remo Lipps Case    Onset Date/Surgical Date 08/21/16   Next MD Visit 11/24/2016   Prior Therapy several years ago      Precautions   Precautions None     Restrictions   Weight Bearing Restrictions No     [place] residence   Type of [place] to enter   Entrance Stairs-Number of Steps 6  unable to go up or down steps in a reciprocal manner.    Home Layout One level     Prior Function   Level of Independence Independent   Vocation Retired   Leisure walk, read, crossword puzzles      Cognition   Overall Cognitive Status Within Functional Limits for tasks assessed     Observation/Other Assessments   Focus on Therapeutic Outcomes (FOTO)  72     Functional Tests   Functional tests Sit to Stand     Sit to Stand  Comments 5 times in 14.11 was  20.88      AROM   Right Knee Extension 25  was 30    Right Knee Flexion 125  was125   Left Knee Extension 13  was 22   Left Knee Flexion 130  was 130     Strength   Right Hip Flexion 5/5   Right Hip Extension 3/5  was 2/5    Right Hip ABduction 4/5  was 4/5    Left Hip Flexion 5/5   Left Hip Extension 3+/5  was 2/5    Left Hip ABduction 4/5   Right Knee Flexion 5/5   Right Knee Extension 5/5   Left Knee Flexion 5/5   Left Knee Extension 5/5   Right Ankle Dorsiflexion 4+/5  was 3/5    Left Ankle Plantar Flexion 4-/5  was 3+/5      Flexibility   Soft Tissue Assessment /Muscle Length yes   Hamstrings Rt 145; Lt 150    Quadriceps prone Lt 100 ; RT 110                     [facility] Adult PT Treatment/Exercise - 10/03/16 0001      Knee/Hip Exercises: Stretches   Passive Hamstring Stretch Both;1 rep;60 seconds   Passive Hamstring Stretch Limitations long sitting ;PROM  x 60 seconds B at end of session      Knee/Hip Exercises: Standing   Heel Raises Both;15 reps   Heel Raises Limitations toe raises x 15      Knee/Hip Exercises: Sidelying   Hip ABduction Both;10 reps     Knee/Hip Exercises: Prone   Hip Extension Strengthening;Both;10 reps                  PT Short Term Goals - 10/03/16 1504      PT SHORT TERM GOAL #1   Title Pt bilateral knee pain to be no greater than a 5/10 to allow pt to ambulate for up to 30 minutes.     Time 3   Period Weeks   Status Achieved     PT SHORT TERM GOAL #2   Title Pt to be able to sit for 40 mintues without increased  Bilateral knee pain to be able to go to a resaurant and enjoy a meal.    Time 3   Period Weeks   Status On-going     PT SHORT TERM GOAL #3   Title Pt bilateral   hip extension strength to be increased 1/2 grade to allow pt to come from sit to stand with increased ease.     Time 3   Period Weeks   Status Achieved           PT Long Term Goals - 10/03/16 1504      PT LONG TERM GOAL #1   Title Pt bilateral knee pain to be no greater than a 3/10 to allow pt to walk in comfort for up to 40 mintues for shopping    Time 6   Period Weeks   Status On-going     PT LONG TERM GOAL #2   Title Pt B hamstring length to have increased 10 degrees to allow decrease knee stress to allow decreased knee pain .    Time 6   Period Weeks   Status Partially Met     PT LONG TERM GOAL #3   Title Pt hip extensors to both be at least 3+/5 to allow  pt to go up and down steps in a reciprocal manner.    Time 6   Period Weeks   Status Partially Met  Rt is 3/5 Lt is 3+/5      PT LONG TERM GOAL #4   Title Pt bilateral knee extension to have improved by 10 degrees to allow a more normalized gait to take stress off of knees, hips and back.    Time 6   Period Weeks   Status Partially Met     PT LONG TERM GOAL #5   Title Pt to be able to sit for an hour with no increased knee pain to allow pt to  feel like she can participate in church services.    Time 6   Period Weeks   Status On-going     PT LONG TERM GOAL #6   Title Pt to states that she has not had to use her cane in the past week to assist with ambulation    Time 6   Period Weeks   Status Achieved               Plan - 2016/10/29 1611    Clinical Impression Statement Pt reassessed this treatment.  She has improved in ROM and strength but continues to have an abnormal gait, decreased ROM and pain.  Lisa Torres will continue to benefit from skillled physical therapyy for her full treatment of 12 visits concentrating  on improving B knee extension and terminal extension strength as well as normalizing her gait.    Rehab Potential Good   PT Frequency 2x / week   PT Duration 6 weeks   PT Treatment/Interventions ADLs/Self Care Home Management;Gait training;Stair training;Functional mobility training;Therapeutic activities;Therapeutic exercise;Balance training;Patient/family education;Manual techniques   PT Next Visit Plan Therapist will need to continue to focus on ambulation 1) walking erect; 2)increasing stride length; 3) heel toe and not flat footed, B knee extension and hip gluteus strength, both medius and maximus.    Consulted and Agree with Plan of Care Patient      Patient will benefit from skilled therapeutic intervention in order to improve the following deficits and impairments:  Abnormal gait, Decreased activity tolerance, Decreased range of motion, Decreased strength, Difficulty walking, Pain  Visit Diagnosis: Chronic pain of right knee  Stiffness of right knee, not elsewhere classified  Stiffness of left knee, not elsewhere classified  Muscle weakness (generalized)       G-Codes - 10-29-2016 1609    Functional Assessment Tool Used (Outpatient Only) clincal judgement, gait, ROM pain    Functional Limitation Mobility: Walking and moving around   Mobility: Walking and Moving Around Current Status [phone])  At least 20 percent but less than 40 percent impaired, limited or restricted   Mobility: Walking and Moving Around Goal Status [phone]) At least 20 percent but less than 40 percent impaired, limited or restricted      Problem List Patient Active Problem List   Diagnosis Date Noted  . Breast cancer, left breast ([place]) 11/02/2015  . Difficulty walking 10/20/2013  . Parkinson disease ([place]) 08/24/2013  . Hypothyroid 05/05/2013  . Hypertension 05/05/2013   Rayetta Humphrey, PT CLT [phone] 10-29-16, 4:14 PM  [facility] [address] [place], [place], [address] Phone: [phone]   Fax:  [phone]  Name: Lisa Torres MRN: [id number] Date of Birth: 05-Dec-1945

## 2016-10-03 NOTE — Patient Instructions (Addendum)
Log Roll    Lying on back, bend left knee and place left arm across chest. Roll all in one movement to the right. Reverse to roll to the left. Always move as one unit.   Copyright  VHI. All rights reserved.  Getting Into / Out of Bed    Lower self to lie down on one side by raising legs and lowering head at the same time. Use arms to assist moving without twisting. Bend both knees to roll onto back if desired. To sit up, start from lying on side, and use same move-ments in reverse. Keep trunk aligned with legs.   Copyright  VHI. All rights reserved.

## 2016-10-07 DIAGNOSIS — N390 Urinary tract infection, site not specified: Secondary | ICD-10-CM | POA: Diagnosis not present

## 2016-10-07 DIAGNOSIS — Z0001 Encounter for general adult medical examination with abnormal findings: Secondary | ICD-10-CM | POA: Diagnosis not present

## 2016-10-07 DIAGNOSIS — G20 Parkinson's disease: Secondary | ICD-10-CM | POA: Diagnosis not present

## 2016-10-07 DIAGNOSIS — M170 Bilateral primary osteoarthritis of knee: Secondary | ICD-10-CM | POA: Diagnosis not present

## 2016-10-07 DIAGNOSIS — I10 Essential (primary) hypertension: Secondary | ICD-10-CM | POA: Diagnosis not present

## 2016-10-07 DIAGNOSIS — E039 Hypothyroidism, unspecified: Secondary | ICD-10-CM | POA: Diagnosis not present

## 2016-10-07 DIAGNOSIS — C50919 Malignant neoplasm of unspecified site of unspecified female breast: Secondary | ICD-10-CM | POA: Diagnosis not present

## 2016-10-07 DIAGNOSIS — Z6830 Body mass index (BMI) 30.0-30.9, adult: Secondary | ICD-10-CM | POA: Diagnosis not present

## 2016-10-08 ENCOUNTER — Ambulatory Visit (HOSPITAL_COMMUNITY): Payer: Medicare Other | Admitting: Physical Therapy

## 2016-10-08 DIAGNOSIS — M25561 Pain in right knee: Principal | ICD-10-CM

## 2016-10-08 DIAGNOSIS — G8929 Other chronic pain: Secondary | ICD-10-CM

## 2016-10-08 DIAGNOSIS — M6281 Muscle weakness (generalized): Secondary | ICD-10-CM

## 2016-10-08 DIAGNOSIS — M25562 Pain in left knee: Secondary | ICD-10-CM | POA: Diagnosis not present

## 2016-10-08 DIAGNOSIS — M25661 Stiffness of right knee, not elsewhere classified: Secondary | ICD-10-CM

## 2016-10-08 NOTE — Therapy (Signed)
[facility] [place], [place], [address] Phone: [phone]   Fax:  [phone]  Physical Therapy Treatment  Patient Details  Name: Lisa Torres MRN: [id number] Date of Birth: [birth year] Referring Provider: Remo Lipps Case   Encounter Date: 10/08/2016      PT End of Session - 10/08/16 1204    Visit Number 9   Number of Visits 12   Date for PT Re-Evaluation 10/03/16   Authorization Type [place] - Visit Number 9   Authorization - Number of Visits 12   PT Start Time 0539   PT Stop Time 1158   PT Time Calculation (min) 40 min   Activity Tolerance Patient tolerated treatment well;No increased pain   Behavior During Therapy WFL for tasks assessed/performed      Past Medical History:  Diagnosis Date  . Breast cancer ([place]) 11/2011  . Chronic back pain   . Chronic leg pain    bilateral  . Chronic leukopenia   . DDD (degenerative disc disease), cervical   . DDD (degenerative disc disease), lumbar   . Gait abnormality   . GERD (gastroesophageal reflux disease)   . Hypertension   . Parkinson's disease ([place])   . Radiation 04/2012   33 treatments for breast cancer  . Right knee pain   . Thyroid disease     Past Surgical History:  Procedure Laterality Date  . ABDOMINAL HYSTERECTOMY     partial  . BREAST LUMPECTOMY Left 12/2011  . CHOLECYSTECTOMY    . COLONOSCOPY N/A 07/01/2013   Procedure: COLONOSCOPY;  Surgeon: Rogene Houston, MD;  [facility];  Service: Endoscopy;  Laterality: N/A;  1030  . TUBAL LIGATION      There were no vitals filed for this visit.      Subjective Assessment - 10/08/16 1128    Subjective Pt states "I don't know about this therapy, seems to be making my knee worse"  States her Rt knee is still bothering her the most with current 6/10 pain.  does not return to MD until June.   Currently in Pain? Yes   Pain Score 6    Pain Location Knee   Pain Orientation  Right                         [facility] Adult PT Treatment/Exercise - 10/08/16 0001      Knee/Hip Exercises: Standing   Gait Training 265f x 2 laps focusing on heel strike and improved ankle DF throughout gait on RLE     Knee/Hip Exercises: Seated   Long Arc Quad Both;2 sets;15 reps   Long Arc Quad Weight 3 lbs.   Sit to Sand 10 reps;without UE support     Knee/Hip Exercises: Prone   Hamstring Curl 15 reps;2 sets   Hip Extension 15 reps;2 sets                  PT Short Term Goals - 10/03/16 1504      PT SHORT TERM GOAL #1   Title Pt bilateral knee pain to be no greater than a 5/10 to allow pt to ambulate for up to 30 minutes.     Time 3   Period Weeks   Status Achieved     PT SHORT TERM GOAL #2   Title Pt to be able to sit for 40 mintues without increased  Bilateral knee pain to be able  to go to a resaurant and enjoy a meal.    Time 3   Period Weeks   Status On-going     PT SHORT TERM GOAL #3   Title Pt bilateral   hip extension strength to be increased 1/2 grade to allow pt to come from sit to stand with increased ease.     Time 3   Period Weeks   Status Achieved           PT Long Term Goals - 10/03/16 1504      PT LONG TERM GOAL #1   Title Pt bilateral knee pain to be no greater than a 3/10 to allow pt to walk in comfort for up to 40 mintues for shopping    Time 6   Period Weeks   Status On-going     PT LONG TERM GOAL #2   Title Pt B hamstring length to have increased 10 degrees to allow decrease knee stress to allow decreased knee pain .    Time 6   Period Weeks   Status Partially Met     PT LONG TERM GOAL #3   Title Pt hip extensors to both be at least 3+/5 to allow pt to go up and down steps in a reciprocal manner.    Time 6   Period Weeks   Status Partially Met  Rt is 3/5 Lt is 3+/5      PT LONG TERM GOAL #4   Title Pt bilateral knee extension to have improved by 10 degrees to allow a more normalized gait to take stress  off of knees, hips and back.    Time 6   Period Weeks   Status Partially Met     PT LONG TERM GOAL #5   Title Pt to be able to sit for an hour with no increased knee pain to allow pt to feel like she can participate in church services.    Time 6   Period Weeks   Status On-going     PT LONG TERM GOAL #6   Title Pt to states that she has not had to use her cane in the past week to assist with ambulation    Time 6   Period Weeks   Status Achieved               Plan - 10/08/16 1205    Clinical Impression Statement Contiued with focus on improving glute and quad strength while improving gait.  Able to elicit UE swing with gait with improved posturing and stride with cues.  Pt with most difficulty completing heel to toe gait with extremely slow gait speed.  Pt c/o increasing Rt knee pain with ambuation and questioning whether she should return to MD earlier.  Suggested if pain increases she may want to contact MD.     Rehab Potential Good   PT Frequency 2x / week   PT Duration 6 weeks   PT Treatment/Interventions ADLs/Self Care Home Management;Gait training;Stair training;Functional mobility training;Therapeutic activities;Therapeutic exercise;Balance training;Patient/family education;Manual techniques   PT Next Visit Plan Therapist will need to continue to focus on ambulation 1) walking erect; 2)increasing stride length; 3) heel toe and not flat footed, B knee extension and hip gluteus strength, both medius and maximus.  Resume standing exericses to pain tolerance.  Add vector stances.     Consulted and Agree with Plan of Care Patient      Patient will benefit from skilled therapeutic intervention in order to improve  the following deficits and impairments:  Abnormal gait, Decreased activity tolerance, Decreased range of motion, Decreased strength, Difficulty walking, Pain  Visit Diagnosis: Chronic pain of right knee  Stiffness of right knee, not elsewhere classified  Muscle  weakness (generalized)     Problem List Patient Active Problem List   Diagnosis Date Noted  . Breast cancer, left breast ([place]) 11/02/2015  . Difficulty walking 10/20/2013  . Parkinson disease ([place]) 08/24/2013  . Hypothyroid 05/05/2013  . Hypertension 05/05/2013    Teena Irani, PTA/CLT [phone]  10/08/2016, 12:08 PM  [facility] [address] [place], [place], [address] Phone: [phone]   Fax:  [phone]  Name: Lisa Torres MRN: [id number] Date of Birth: 1946/01/08

## 2016-10-10 ENCOUNTER — Ambulatory Visit (HOSPITAL_COMMUNITY): Payer: Medicare Other | Admitting: Physical Therapy

## 2016-10-10 DIAGNOSIS — M25562 Pain in left knee: Secondary | ICD-10-CM | POA: Diagnosis not present

## 2016-10-10 DIAGNOSIS — R262 Difficulty in walking, not elsewhere classified: Secondary | ICD-10-CM

## 2016-10-10 DIAGNOSIS — M25561 Pain in right knee: Secondary | ICD-10-CM

## 2016-10-10 DIAGNOSIS — G8929 Other chronic pain: Secondary | ICD-10-CM

## 2016-10-10 NOTE — Therapy (Signed)
[place], [place], [address] Phone: [phone]   Fax:  [phone]  Physical Therapy Treatment  Patient Details  Name: Lisa Torres MRN: [id number] Date of Birth: 09/15/1945 Referring Provider: Remo Lipps Case   Encounter Date: 10/10/2016      PT End of Session - 10/10/16 1146    Visit Number 10   Number of Visits 12   Date for PT Re-Evaluation 10/03/16   Authorization Type [place] - Visit Number 10   Authorization - Number of Visits 12   PT Start Time 7858   PT Stop Time 1155   PT Time Calculation (min) 40 min   Activity Tolerance Patient tolerated treatment well;No increased pain   Behavior During Therapy WFL for tasks assessed/performed      Past Medical History:  Diagnosis Date  . Breast cancer ([place]) 11/2011  . Chronic back pain   . Chronic leg pain    bilateral  . Chronic leukopenia   . DDD (degenerative disc disease), cervical   . DDD (degenerative disc disease), lumbar   . Gait abnormality   . GERD (gastroesophageal reflux disease)   . Hypertension   . Parkinson's disease ([place])   . Radiation 04/2012   33 treatments for breast cancer  . Right knee pain   . Thyroid disease     Past Surgical History:  Procedure Laterality Date  . ABDOMINAL HYSTERECTOMY     partial  . BREAST LUMPECTOMY Left 12/2011  . CHOLECYSTECTOMY    . COLONOSCOPY N/A 07/01/2013   Procedure: COLONOSCOPY;  Surgeon: Rogene Houston, MD;  [facility];  Service: Endoscopy;  Laterality: N/A;  1030  . TUBAL LIGATION      There were no vitals filed for this visit.      Subjective Assessment - 10/10/16 1120    Subjective Lisa Torres states that her left knee is doing fine but her right knee is sore and aching.     Pertinent History HTN, Cancer, cervical and lumbar pain,    How long can you sit comfortably? 10-15 minutes was 30 minutes    How long can you stand comfortably? 30 minutes  was 15 minutes   How long can you walk comfortably? 30 minutes was 15 minutes   Patient Stated Goals less pain, be able to walk longer    Currently in Pain? Yes   Pain Score 7    Pain Location Knee   Pain Orientation Left;Right;Anterior   Pain Descriptors / Indicators Dull;Aching   Pain Onset More than a month ago   Pain Frequency Constant   Aggravating Factors  up and down steps    Pain Relieving Factors ice    Effect of Pain on Daily Activities increases             [facility] PT Assessment - 10/10/16 0001      Assessment   Medical Diagnosis B knee pain    Referring Provider Remo Lipps Case    Onset Date/Surgical Date 08/21/16   Next MD Visit 11/24/2016   Prior Therapy several years ago      Precautions   Precautions None     Restrictions   Weight Bearing Restrictions No     [place] residence   Type of [place] to enter   Entrance Stairs-Number of Steps 6  unable to go up or down steps in a  reciprocal manner.    Home Layout One level     Prior Function   Level of Independence Independent   Vocation Retired   Leisure walk, read, crossword puzzles      Cognition   Overall Cognitive Status Within Functional Limits for tasks assessed     Observation/Other Assessments   Focus on Therapeutic Outcomes (FOTO)  42  was 72 but pt did foto on her own and was not sure about it      Functional Tests   Functional tests Sit to Stand     Sit to Stand   Comments 5 times in 18.11 was  20.88      AROM   Right Knee Extension 25  was 30    Right Knee Flexion 125  was125   Left Knee Extension 13  was 22   Left Knee Flexion 130  was 130     Strength   Right Hip Flexion 5/5   Right Hip Extension 4/5  was 2/5    Right Hip ABduction 4/5  was 4/5    Left Hip Flexion 5/5   Left Hip Extension 4/5  was 2/5    Left Hip ABduction 4/5   Right Knee Flexion 5/5   Right Knee Extension 5/5   Left Knee Flexion 5/5    Left Knee Extension 5/5   Right Ankle Dorsiflexion 4+/5  was 3/5    Left Ankle Plantar Flexion 5/5  was 4-/5     Flexibility   Soft Tissue Assessment /Muscle Length yes   Hamstrings Rt 145; Lt 150    Quadriceps prone Lt 100 ; RT 110                     [facility] Adult PT Treatment/Exercise - 10/10/16 0001      Knee/Hip Exercises: Stretches   Active Hamstring Stretch Both;3 reps;30 seconds     Knee/Hip Exercises: Seated   Sit to [employer] 5 reps     Knee/Hip Exercises: Supine   Quad Sets 20 reps   Terminal Knee Extension Strengthening;Both;20 reps   Bridges with [employer] 20 reps   Other Supine Knee/Hip Exercises Ankle pumps 20        nustep x 10' at end of session no charge:          PT Education - 10/10/16 1145    Education provided Yes   Education Details The importance of posture and keeping legs strong    Person(s) Educated Patient   Methods Explanation   Comprehension Verbalized understanding          PT Short Term Goals - 10/10/16 1135      PT SHORT TERM GOAL #1   Title Pt bilateral knee pain to be no greater than a 5/10 to allow pt to ambulate for up to 30 minutes.     Time 3   Period Weeks   Status Not Met     PT SHORT TERM GOAL #2   Title Pt to be able to sit for 40 mintues without increased  Bilateral knee pain to be able to go to a resaurant and enjoy a meal.    Time 3   Period Weeks   Status On-going     PT SHORT TERM GOAL #3   Title Pt bilateral   hip extension strength to be increased 1/2 grade to allow pt to come from sit to stand with increased ease.     Time 3   Period  Weeks   Status Achieved           PT Long Term Goals - 09-Nov-2016 1136      PT LONG TERM GOAL #1   Title Pt bilateral knee pain to be no greater than a 3/10 to allow pt to walk in comfort for up to 40 mintues for shopping    Time 6   Period Weeks   Status Not Met     PT LONG TERM GOAL #2   Title Pt B hamstring length to have increased 10 degrees to  allow decrease knee stress to allow decreased knee pain .    Time 6   Period Weeks   Status Partially Met     PT LONG TERM GOAL #3   Title Pt hip extensors to both be at least 3+/5 to allow pt to go up and down steps in a reciprocal manner.    Time 6   Period Weeks   Status Partially Met  Rt is 3/5 Lt is 3+/5      PT LONG TERM GOAL #4   Title Pt bilateral knee extension to have improved by 10 degrees to allow a more normalized gait to take stress off of knees, hips and back.    Time 6   Period Weeks   Status Partially Met     PT LONG TERM GOAL #5   Title Pt to be able to sit for an hour with no increased knee pain to allow pt to feel like she can participate in church services.    Time 6   Period Weeks   Status On-going     PT LONG TERM GOAL #6   Title Pt to states that she has not had to use her cane in the past week to assist with ambulation    Time 6   Period Weeks   Status Achieved               Plan - 11/09/16 1146    Clinical Impression Statement Ms. Rommel continues to gain in strength and flexibility but her pain remains high.  I explained to her that I feel her knee is down to bone on bone and the pain is going to be there.  She continues to walk flat footed.  The last two sessions will concentrate on terminal extension strength, heel toe ambulation and flexibiltiy.    Rehab Potential Good   PT Frequency 2x / week   PT Duration 6 weeks   PT Treatment/Interventions ADLs/Self Care Home Management;Gait training;Stair training;Functional mobility training;Therapeutic activities;Therapeutic exercise;Balance training;Patient/family education;Manual techniques   PT Next Visit Plan  continue to focus on ambulation 1) walking erect; 2)increasing stride length; 3) heel toe and not flat footed, B knee extension and hip gluteus strength, both medius and maximus.  Resume standing exericses to pain tolerance.  Add vector stances.     Consulted and Agree with Plan of Care  Patient      Patient will benefit from skilled therapeutic intervention in order to improve the following deficits and impairments:  Abnormal gait, Decreased activity tolerance, Decreased range of motion, Decreased strength, Difficulty walking, Pain  Visit Diagnosis: Difficulty walking  Chronic pain of right knee  Chronic pain of left knee       G-Codes - 11/09/16 1156    Functional Assessment Tool Used (Outpatient Only) foto   Functional Limitation Mobility: Walking and moving around   Mobility: Walking and Moving Around Current Status (P1025) At least 40  percent but less than 60 percent impaired, limited or restricted   Mobility: Walking and Moving Around Goal Status [phone]) At least 20 percent but less than 40 percent impaired, limited or restricted      Problem List Patient Active Problem List   Diagnosis Date Noted  . Breast cancer, left breast ([place]) 11/02/2015  . Difficulty walking 10/20/2013  . Parkinson disease ([place]) 08/24/2013  . Hypothyroid 05/05/2013  . Hypertension 05/05/2013  Rayetta Humphrey, PT CLT [phone] 10/10/2016, 11:57 AM  [facility] [address] [place], [place], [address] Phone: [phone]   Fax:  [phone]  Name: Lisa Torres MRN: [id number] Date of Birth: July 12, 1945

## 2016-10-10 NOTE — Patient Instructions (Addendum)
Strengthening: Hip Extension (Prone)    Tighten muscles on front of left thigh, then lift leg _3___ inches from surface, keeping knee locked. Repeat ___10_ times per set. Do _1___ sets per session. Do __2__ sessions per day. Repeat to your right leg  http://orth.exer.us/620   Copyright  VHI. All rights reserved.

## 2016-10-15 ENCOUNTER — Ambulatory Visit (HOSPITAL_COMMUNITY): Payer: Medicare Other | Attending: Orthopedic Surgery | Admitting: Physical Therapy

## 2016-10-15 DIAGNOSIS — M6281 Muscle weakness (generalized): Secondary | ICD-10-CM | POA: Diagnosis present

## 2016-10-15 DIAGNOSIS — M25661 Stiffness of right knee, not elsewhere classified: Secondary | ICD-10-CM | POA: Diagnosis present

## 2016-10-15 DIAGNOSIS — M25562 Pain in left knee: Secondary | ICD-10-CM | POA: Insufficient documentation

## 2016-10-15 DIAGNOSIS — R262 Difficulty in walking, not elsewhere classified: Secondary | ICD-10-CM | POA: Diagnosis not present

## 2016-10-15 DIAGNOSIS — M25662 Stiffness of left knee, not elsewhere classified: Secondary | ICD-10-CM | POA: Diagnosis present

## 2016-10-15 DIAGNOSIS — G8929 Other chronic pain: Secondary | ICD-10-CM | POA: Insufficient documentation

## 2016-10-15 DIAGNOSIS — M25561 Pain in right knee: Secondary | ICD-10-CM | POA: Diagnosis present

## 2016-10-15 NOTE — Therapy (Signed)
[facility] [place], [place], [address] Phone: [phone]   Fax:  [phone]  Physical Therapy Treatment  Patient Details  Name: Lisa Torres MRN: [id number] Date of Birth: 1946-02-06 Referring Provider: Remo Lipps Case   Encounter Date: 10/15/2016      PT End of Session - 10/15/16 1428    Visit Number 11   Number of Visits 12   Date for PT Re-Evaluation 10/03/16   Authorization Type [place] - Visit Number 11   Authorization - Number of Visits 12   PT Start Time 0355   PT Stop Time 1430   PT Time Calculation (min) 42 min   Activity Tolerance Patient tolerated treatment well;No increased pain   Behavior During Therapy WFL for tasks assessed/performed      Past Medical History:  Diagnosis Date  . Breast cancer ([place]) 11/2011  . Chronic back pain   . Chronic leg pain    bilateral  . Chronic leukopenia   . DDD (degenerative disc disease), cervical   . DDD (degenerative disc disease), lumbar   . Gait abnormality   . GERD (gastroesophageal reflux disease)   . Hypertension   . Parkinson's disease ([place])   . Radiation 04/2012   33 treatments for breast cancer  . Right knee pain   . Thyroid disease     Past Surgical History:  Procedure Laterality Date  . ABDOMINAL HYSTERECTOMY     partial  . BREAST LUMPECTOMY Left 12/2011  . CHOLECYSTECTOMY    . COLONOSCOPY N/A 07/01/2013   Procedure: COLONOSCOPY;  Surgeon: Rogene Houston, MD;  [facility];  Service: Endoscopy;  Laterality: N/A;  1030  . TUBAL LIGATION      There were no vitals filed for this visit.      Subjective Assessment - 10/15/16 1352    Subjective Pt states her Rt knee is still killing her.  States she doesn't go back to the orthopedist until June.  Comes today without AD.   Currently in Pain? Yes   Pain Score 7    Pain Location Knee   Pain Orientation Right;Anterior   Pain Descriptors / Indicators Aching    Pain Type Chronic pain                         [facility] Adult PT Treatment/Exercise - 10/15/16 0001      Ambulation/Gait   Ambulation/Gait Yes   Ambulation/Gait Assistance 7: Independent   Ambulation Distance (Feet) 226 Feet   Assistive device None   Gait Pattern Decreased arm swing - right;Decreased arm swing - left;Decreased stride length;Right foot flat;Left foot flat;Right flexed knee in stance;Left flexed knee in stance;Trunk flexed;Narrow base of support   Gait velocity .42 m/sec     Knee/Hip Exercises: Stretches   Passive Hamstring Stretch Both;1 rep;60 seconds   Passive Hamstring Stretch Limitations long sitting ;PROM x 60 seconds B at end of session      Knee/Hip Exercises: Standing   Terminal Knee Extension Limitations x10  with RTB held by therapist   SLS with Vectors bilaterally, 1 UE assist 3X3" holds each     Knee/Hip Exercises: Seated   Long Arc Quad Both;2 sets;15 reps   Long Arc Quad Weight 3 lbs.   Sit to [employer] 10 reps     Knee/Hip Exercises: Supine   Terminal Knee Extension Strengthening;Both;20 reps  PT Short Term Goals - 10/10/16 1135      PT SHORT TERM GOAL #1   Title Pt bilateral knee pain to be no greater than a 5/10 to allow pt to ambulate for up to 30 minutes.     Time 3   Period Weeks   Status Not Met     PT SHORT TERM GOAL #2   Title Pt to be able to sit for 40 mintues without increased  Bilateral knee pain to be able to go to a resaurant and enjoy a meal.    Time 3   Period Weeks   Status On-going     PT SHORT TERM GOAL #3   Title Pt bilateral   hip extension strength to be increased 1/2 grade to allow pt to come from sit to stand with increased ease.     Time 3   Period Weeks   Status Achieved           PT Long Term Goals - 10/10/16 1136      PT LONG TERM GOAL #1   Title Pt bilateral knee pain to be no greater than a 3/10 to allow pt to walk in comfort for up to 40 mintues for shopping     Time 6   Period Weeks   Status Not Met     PT LONG TERM GOAL #2   Title Pt B hamstring length to have increased 10 degrees to allow decrease knee stress to allow decreased knee pain .    Time 6   Period Weeks   Status Partially Met     PT LONG TERM GOAL #3   Title Pt hip extensors to both be at least 3+/5 to allow pt to go up and down steps in a reciprocal manner.    Time 6   Period Weeks   Status Partially Met  Rt is 3/5 Lt is 3+/5      PT LONG TERM GOAL #4   Title Pt bilateral knee extension to have improved by 10 degrees to allow a more normalized gait to take stress off of knees, hips and back.    Time 6   Period Weeks   Status Partially Met     PT LONG TERM GOAL #5   Title Pt to be able to sit for an hour with no increased knee pain to allow pt to feel like she can participate in church services.    Time 6   Period Weeks   Status On-going     PT LONG TERM GOAL #6   Title Pt to states that she has not had to use her cane in the past week to assist with ambulation    Time 6   Period Weeks   Status Achieved               Plan - 10/15/16 1429    Clinical Impression Statement Continued with focus on improving LE extension, ambulation, glute and quad strength.  Pain continues to limit progress with knee flexion contractures from arthritic changes.  Completed 226 feet in 2'43", which makes a .57msec gait velocity and risk of falling. Constant cues to increase stride, increase UE swing and heel-toe gait.   Encouraged pateint to continue using SPC, as she came today wtihout AD.     Rehab Potential Good   PT Frequency 2x / week   PT Duration 6 weeks   PT Treatment/Interventions ADLs/Self Care Home Management;Gait training;Stair training;Functional mobility training;Therapeutic activities;Therapeutic  exercise;Balance training;Patient/family education;Manual techniques   PT Next Visit Plan Re-evaluate next session.   Consulted and Agree with Plan of Care Patient       Patient will benefit from skilled therapeutic intervention in order to improve the following deficits and impairments:  Abnormal gait, Decreased activity tolerance, Decreased range of motion, Decreased strength, Difficulty walking, Pain  Visit Diagnosis: Difficulty walking  Chronic pain of right knee  Chronic pain of left knee  Stiffness of right knee, not elsewhere classified  Muscle weakness (generalized)  Stiffness of left knee, not elsewhere classified     Problem List Patient Active Problem List   Diagnosis Date Noted  . Breast cancer, left breast ([place]) 11/02/2015  . Difficulty walking 10/20/2013  . Parkinson disease ([place]) 08/24/2013  . Hypothyroid 05/05/2013  . Hypertension 05/05/2013    Teena Irani, PTA/CLT [phone]  10/15/2016, 2:51 PM  [facility] [address] [place], [place], [address] Phone: [phone]   Fax:  [phone]  Name: Lisa Torres MRN: [id number] Date of Birth: Aug 26, 1945

## 2016-10-17 ENCOUNTER — Ambulatory Visit (HOSPITAL_COMMUNITY): Payer: Medicare Other | Admitting: Physical Therapy

## 2016-10-17 DIAGNOSIS — M25661 Stiffness of right knee, not elsewhere classified: Secondary | ICD-10-CM

## 2016-10-17 DIAGNOSIS — R262 Difficulty in walking, not elsewhere classified: Secondary | ICD-10-CM | POA: Diagnosis not present

## 2016-10-17 DIAGNOSIS — G8929 Other chronic pain: Secondary | ICD-10-CM

## 2016-10-17 DIAGNOSIS — M25562 Pain in left knee: Secondary | ICD-10-CM

## 2016-10-17 DIAGNOSIS — M25662 Stiffness of left knee, not elsewhere classified: Secondary | ICD-10-CM

## 2016-10-17 DIAGNOSIS — M25561 Pain in right knee: Secondary | ICD-10-CM

## 2016-10-17 DIAGNOSIS — M6281 Muscle weakness (generalized): Secondary | ICD-10-CM

## 2016-10-17 NOTE — Therapy (Signed)
[place], [place], [address] Phone: [phone]   Fax:  [phone]  Physical Therapy Treatment  Patient Details  Name: Lisa Torres MRN: [id number] Date of Birth: [birth year] Referring Provider: Remo Lipps Case   Encounter Date: 10/17/2016      PT End of Session - 10/17/16 1444    Visit Number 12   Number of Visits 12   Date for PT Re-Evaluation 10/03/16   Authorization Type [place] - Visit Number 12   Authorization - Number of Visits 12   PT Start Time 5809   PT Stop Time 9833   PT Time Calculation (min) 43 min   Activity Tolerance Patient tolerated treatment well;No increased pain   Behavior During Therapy WFL for tasks assessed/performed      Past Medical History:  Diagnosis Date  . Breast cancer ([place]) 11/2011  . Chronic back pain   . Chronic leg pain    bilateral  . Chronic leukopenia   . DDD (degenerative disc disease), cervical   . DDD (degenerative disc disease), lumbar   . Gait abnormality   . GERD (gastroesophageal reflux disease)   . Hypertension   . Parkinson's disease ([place])   . Radiation 04/2012   33 treatments for breast cancer  . Right knee pain   . Thyroid disease     Past Surgical History:  Procedure Laterality Date  . ABDOMINAL HYSTERECTOMY     partial  . BREAST LUMPECTOMY Left 12/2011  . CHOLECYSTECTOMY    . COLONOSCOPY N/A 07/01/2013   Procedure: COLONOSCOPY;  Surgeon: Rogene Houston, MD;  [facility];  Service: Endoscopy;  Laterality: N/A;  1030  . TUBAL LIGATION      There were no vitals filed for this visit.      Subjective Assessment - 10/17/16 1423    Subjective Lisa Torres states that she just can't walk heel to toe it hurts too much    Pertinent History HTN, Cancer, cervical and lumbar pain,    How long can you sit comfortably? 10-15 minutes was 30 minutes    How long can you stand comfortably? 30 minutes was 15 minutes   How long can you walk comfortably? 30 minutes was 15 minutes   Patient Stated Goals less pain, be able to walk longer    Currently in Pain? Yes   Pain Score 3    Pain Location Knee   Pain Descriptors / Indicators Aching   Pain Type Chronic pain   Pain Onset More than a month ago   Pain Frequency Constant   Aggravating Factors  walking    Pain Relieving Factors ice/heat    Effect of Pain on Daily Activities increases            [facility] PT Assessment - 10/17/16 0001      Assessment   Medical Diagnosis B knee pain    Onset Date/Surgical Date 08/21/16   Next MD Visit 11/24/2016   Prior Therapy several years ago      Precautions   Precautions None     Restrictions   Weight Bearing Restrictions No     Home Environment   Living Environment Private residence   Type of [place] to enter   Entrance Stairs-Number of Steps 6  unable to go up or down steps in a reciprocal manner.    Home Layout One level     Prior Function  Level of Independence Independent   Vocation Retired   Leisure walk, read, crossword puzzles      Cognition   Overall Cognitive Status Within Functional Limits for tasks assessed     Observation/Other Assessments   Focus on Therapeutic Outcomes (FOTO)  42  was 72 but pt did foto on her own and was not sure about it      Functional Tests   Functional tests Sit to Stand     Sit to Stand   Comments 5 times in 18.11 was  20.88      AROM   Right Knee Extension 22  was 30    Right Knee Flexion 125  was125   Left Knee Extension 13  was 22   Left Knee Flexion 130  was 130     Strength   Right Hip Flexion 5/5   Right Hip Extension 4/5  was 2/5    Right Hip ABduction 4/5  was 4/5    Left Hip Flexion 5/5   Left Hip Extension 4/5  was 2/5    Left Hip ABduction 4/5   Right Knee Flexion 5/5   Right Knee Extension 5/5   Left Knee Flexion 5/5   Left Knee Extension 5/5   Right Ankle Dorsiflexion 4+/5  was 3/5    Left Ankle  Plantar Flexion 5/5  was 4-/5     Flexibility   Soft Tissue Assessment /Muscle Length yes   Hamstrings Rt 145; Lt 150   Quadriceps prone Lt 100 ; RT 110                     [facility] Adult PT Treatment/Exercise - 10/17/16 0001      Knee/Hip Exercises: Stretches   Active Hamstring Stretch Both;3 reps;30 seconds   Gastroc Stretch Both;3 reps;30 seconds     Knee/Hip Exercises: Aerobic   Nustep hills 3 level 3 x 10:00     Knee/Hip Exercises: Seated   Long Arc Quad Strengthening;Both;10 reps     Knee/Hip Exercises: Supine   Quad Sets 10 reps   Short Arc Quad Sets Both;10 reps   Terminal Knee Extension Strengthening;Both;10 reps   Other Supine Knee/Hip Exercises Ankle pumps 20      Nustep at end of treatment x 10:00 not counted towards time: pt left department at 1450             PT Short Term Goals - 10/10/16 1135      PT SHORT TERM GOAL #1   Title Pt bilateral knee pain to be no greater than a 5/10 to allow pt to ambulate for up to 30 minutes.     Time 3   Period Weeks   Status Not Met     PT SHORT TERM GOAL #2   Title Pt to be able to sit for 40 mintues without increased  Bilateral knee pain to be able to go to a resaurant and enjoy a meal.    Time 3   Period Weeks   Status On-going     PT SHORT TERM GOAL #3   Title Pt bilateral   hip extension strength to be increased 1/2 grade to allow pt to come from sit to stand with increased ease.     Time 3   Period Weeks   Status Achieved           PT Long Term Goals - 10/10/16 1136      PT LONG TERM GOAL #1  Title Pt bilateral knee pain to be no greater than a 3/10 to allow pt to walk in comfort for up to 40 mintues for shopping    Time 6   Period Weeks   Status Not Met     PT LONG TERM GOAL #2   Title Pt B hamstring length to have increased 10 degrees to allow decrease knee stress to allow decreased knee pain .    Time 6   Period Weeks   Status Partially Met     PT LONG TERM GOAL #3    Title Pt hip extensors to both be at least 3+/5 to allow pt to go up and down steps in a reciprocal manner.    Time 6   Period Weeks   Status Partially Met  Rt is 3/5 Lt is 3+/5      PT LONG TERM GOAL #4   Title Pt bilateral knee extension to have improved by 10 degrees to allow a more normalized gait to take stress off of knees, hips and back.    Time 6   Period Weeks   Status Partially Met     PT LONG TERM GOAL #5   Title Pt to be able to sit for an hour with no increased knee pain to allow pt to feel like she can participate in church services.    Time 6   Period Weeks   Status On-going     PT LONG TERM GOAL #6   Title Pt to states that she has not had to use her cane in the past week to assist with ambulation    Time 6   Period Weeks   Status Achieved               Plan - 2016-11-09 1444    Clinical Impression Statement Pt continues to ambulate with her knees flexed and without ankle dorsi/plantarflexion.  Pt states that she is unable to walk properly due to pain.  Pt continues to have decreased extension in B LE but any attempts of PROM increases pain beyond pt tolerance.  At this time she is I with her HEP and is ready to be discharged .   Rehab Potential Good   PT Frequency 2x / week   PT Duration 6 weeks   PT Treatment/Interventions ADLs/Self Care Home Management;Gait training;Stair training;Functional mobility training;Therapeutic activities;Therapeutic exercise;Balance training;Patient/family education;Manual techniques   PT Next Visit Plan Discharge pt to HEP    Consulted and Agree with Plan of Care Patient      Patient will benefit from skilled therapeutic intervention in order to improve the following deficits and impairments:  Abnormal gait, Decreased activity tolerance, Decreased range of motion, Decreased strength, Difficulty walking, Pain  Visit Diagnosis: Difficulty walking  Chronic pain of right knee  Chronic pain of left knee  Stiffness of right  knee, not elsewhere classified  Muscle weakness (generalized)  Stiffness of left knee, not elsewhere classified       G-Codes - 11-09-2016 1447    Functional Assessment Tool Used (Outpatient Only) gfoto   Functional Limitation Mobility: Walking and moving around   Mobility: Walking and Moving Around Goal Status [phone]) At least 20 percent but less than 40 percent impaired, limited or restricted   Mobility: Walking and Moving Around Discharge Status [phone]) At least 40 percent but less than 60 percent impaired, limited or restricted      Problem List Patient Active Problem List   Diagnosis Date Noted  . Breast  cancer, left breast ([place]) 11/02/2015  . Difficulty walking 10/20/2013  . Parkinson disease ([place]) 08/24/2013  . Hypothyroid 05/05/2013  . Hypertension 05/05/2013   Rayetta Humphrey, PT CLT [phone] 10/17/2016, 2:48 PM  [facility] [address] [place], [place], [address] Phone: [phone]   Fax:  [phone]  Name: Lisa Torres MRN: [id number] Date of Birth: 1946/05/11  PHYSICAL THERAPY DISCHARGE SUMMARY  Visits from Start of Care: 12  Current functional level related to goals / functional outcomes: See above   Remaining deficits: See above   Education / Equipment: HEP Plan: Patient agrees to discharge.  Patient goals were partially met. Patient is being discharged due to lack of progress.  ?????        Rayetta Humphrey, [place] CLT [phone]

## 2016-10-17 NOTE — Patient Instructions (Addendum)
ROM: Plantar / Dorsiflexion    With both leg relaxed, gently flex and extend ankle. Move through full range of motion. Avoid pain. Repeat _15___ times per set. Do _1___ sets per session. Do __2__ sessions per day.  http://orth.exer.us/34   Copyright  VHI. All rights reserved.  Strengthening: Quadriceps Set    Tighten muscles on top of thighs by pushing knees down into surface. Hold __3-5__ seconds. Repeat _10___ times per set. Do 1____ sets per session. Do ___2_ sessions per day.  http://orth.exer.us/602   Copyright  VHI. All rights reserved.  Strengthening: Terminal Knee Extension (Supine)    With right knee over bolster, straighten knee by tightening muscles on top of thigh. Keep bottom of knee on bolster. Repeat __10-15__ times per set. Do __1__ sets per session. Do ___2_ sessions per day.  http://orth.exer.us/626   Copyright  VHI. All rights reserved.  Stretching: Hamstring (Supine)    Supporting right thigh behind knee, slowly straighten knee until stretch is felt in back of thigh. Hold __30__ seconds. Repeat _3___ times per set. Do ___1_ sets per session. Do __2__ sessions per day.  http://orth.exer.us/656   Copyright  VHI. All rights reserved.  Backward Bend (Standing)    Arch backward to make hollow of back deeper. Hold __2__ seconds. Repeat _10___ times per set. Do ___1_ sets per session. Do ___2_ sessions per day.  http://orth.exer.us/178   Copyright  VHI. All rights reserved.

## 2016-10-22 ENCOUNTER — Encounter (HOSPITAL_COMMUNITY): Payer: Medicare Other | Admitting: Physical Therapy

## 2016-10-24 ENCOUNTER — Encounter (HOSPITAL_COMMUNITY): Payer: Medicare Other | Admitting: Physical Therapy

## 2016-11-04 ENCOUNTER — Encounter (HOSPITAL_COMMUNITY): Payer: Self-pay

## 2016-11-04 ENCOUNTER — Encounter (HOSPITAL_COMMUNITY): Payer: Medicare Other | Attending: Hematology & Oncology | Admitting: Oncology

## 2016-11-04 VITALS — BP 154/65 | HR 62 | Temp 97.7°F | Resp 16 | Wt 167.4 lb

## 2016-11-04 DIAGNOSIS — D72819 Decreased white blood cell count, unspecified: Secondary | ICD-10-CM

## 2016-11-04 DIAGNOSIS — C50012 Malignant neoplasm of nipple and areola, left female breast: Secondary | ICD-10-CM

## 2016-11-04 DIAGNOSIS — Z170 Estrogen receptor positive status [ER+]: Secondary | ICD-10-CM

## 2016-11-04 DIAGNOSIS — C50912 Malignant neoplasm of unspecified site of left female breast: Secondary | ICD-10-CM | POA: Diagnosis not present

## 2016-11-04 NOTE — Progress Notes (Signed)
Sasser, Silvestre Moment, MD [address] [place] [address]   DIAGNOSIS: Stage I Left breast cancer diagnosed 01/07/2012 ER/PR positive S/p lumpectomy, sentinel node biopsy XRT Adjuvant Arimidex Chronic Leukopenia History of BMBX 30% cellularity without MDS, or other primary Bone marrow disorder  CURRENT THERAPY: Arimidex   INTERVAL HISTORY: Lisa Torres 71 y.o. female returns for follow up of Stage I Left Breast cancer diagnosed in 2013, and chronic leukopenia.   Lisa Torres is accompanied by her husband today. Last mammogram was in August 2017 and it was normal. She takes her arimidex daily, along with her calcium and vitamin D.   She has chronic right knee pain, otherwise denies any other arthralgias. She denies any hot flashes. She is tolerating Arimidex well. She states that she does note that she gets intermittent constipation from the calcium tablets. She has not palpated any new breast masses at home or noted any axillary lymph nodes. Overall there has been no change in her health since the last visit.   MEDICAL HISTORY: Past Medical History:  Diagnosis Date  . Breast cancer ([place]) 11/2011  . Chronic back pain   . Chronic leg pain    bilateral  . Chronic leukopenia   . DDD (degenerative disc disease), cervical   . DDD (degenerative disc disease), lumbar   . Gait abnormality   . GERD (gastroesophageal reflux disease)   . Hypertension   . Parkinson's disease ([place])   . Radiation 04/2012   33 treatments for breast cancer  . Right knee pain   . Thyroid disease     has Hypothyroid; Hypertension; Parkinson disease ([place]); Difficulty walking; and Breast cancer, left breast ([place]) on her problem list.     is allergic to hydrocodone and tramadol.  Current Outpatient Prescriptions on File Prior to Visit  Medication Sig Dispense Refill  . amLODipine (NORVASC) 5 MG tablet Take 5 mg by mouth every morning.     Marland Kitchen anastrozole (ARIMIDEX) 1 MG tablet TAKE 1 TABLET EVERY  MORNING 90 tablet 3  . calcium carbonate (OS-CAL) 600 MG TABS tablet Take 600 mg by mouth 2 (two) times daily with a meal.    . carbidopa-levodopa (SINEMET IR) 25-100 MG tablet Take 1 tablet by mouth 3 (three) times daily. 90 tablet 11  . Cholecalciferol (VITAMIN D-3) 1000 UNITS CAPS Take 1,000 Units by mouth 2 (two) times daily.    Marland Kitchen levothyroxine (SYNTHROID, LEVOTHROID) 112 MCG tablet Take 1 tablet (112 mcg total) by mouth daily before breakfast. 30 tablet 0  . magnesium citrate SOLN Take 296 mLs (1 Bottle total) by mouth once as needed for severe constipation. 195 mL 0  . meloxicam (MOBIC) 15 MG tablet Take 15 mg by mouth daily.    . polyethylene glycol powder (GLYCOLAX/MIRALAX) powder Take 17 g by mouth daily. Dissolve one capsule powder into any liquid intake once daily. If no good effect in 3 days, take twice daily. Can increased to three times daily as needed 500 g 0  . pramipexole (MIRAPEX) 1 MG tablet Take 1.5 tablets (1.5 mg total) by mouth 3 (three) times daily. 405 tablet 3  . traZODone (DESYREL) 50 MG tablet TAKE 1 TABLET BY MOUT AT BEDTIME  6  . triamcinolone cream (KENALOG) 0.1 % APPLY TO AFFECTED AREA UP TO TWICE DAILY AS NEEDED (NOT TO FACE, GROIN, OR UNDERARMS)  3  . Wheat Dextrin (BENEFIBER DRINK MIX PO) Take 1 scoop by mouth daily.     No  current facility-administered medications on file prior to visit.      SURGICAL HISTORY: Past Surgical History:  Procedure Laterality Date  . ABDOMINAL HYSTERECTOMY     partial  . BREAST LUMPECTOMY Left 12/2011  . CHOLECYSTECTOMY    . COLONOSCOPY N/A 07/01/2013   Procedure: COLONOSCOPY;  Surgeon: Rogene Houston, MD;  [facility];  Service: Endoscopy;  Laterality: N/A;  1030  . TUBAL LIGATION      SOCIAL HISTORY: Social History   Social History  . Marital status: Married    Spouse name: Lisa Torres  . Number of children: 3  . Years of education: 67 th   Occupational History  .      retired   Social History Main  Topics  . Smoking status: Former Smoker    Packs/day: 0.50    Years: 18.00    Types: Cigarettes    Quit date: 09/30/1998  . Smokeless tobacco: Never Used  . Alcohol use No  . Drug use: No  . Sexual activity: Not on file   Other Topics Concern  . Not on file   Social History Narrative   Patient lives at home with her husband Lisa Torres).    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 Cancer Mother 15       colon cancer  . Cancer Father 75       throat cancer  . Pancreatic cancer Brother        63y 11-14-15    Review of Systems  Constitutional: Negative.   HENT: Negative.   Eyes: Negative.   Respiratory: Negative.   Cardiovascular: Negative.   Gastrointestinal: Negative.  Negative for abdominal pain.  Genitourinary: Negative.   Musculoskeletal: Negative for joint pain (right knee).       Right knee pain  Skin: Negative.   Neurological: Negative.   Endo/Heme/Allergies: Negative.   Psychiatric/Behavioral: Negative.   All other systems reviewed and are negative. 14 point review of systems was performed and is negative except as detailed under history of present illness and above  PHYSICAL EXAMINATION  ECOG PERFORMANCE STATUS: 1 - Symptomatic but completely ambulatory  Vitals:   11/04/16 1101  BP: (!) 154/65  Pulse: 62  Resp: 16  Temp: 97.7 F (36.5 C)     Physical Exam  Constitutional: She is oriented to person, place, and time and well-developed, well-nourished, and in no distress. No distress.  HENT:  Head: Normocephalic and atraumatic.  Mouth/Throat: No oropharyngeal exudate.  Eyes: Conjunctivae and EOM are normal. Pupils are equal, round, and reactive to light. Right eye exhibits no discharge. Left eye exhibits no discharge. No scleral icterus.  Neck: Normal range of motion. Neck supple. No JVD present.  Cardiovascular: Normal rate, regular rhythm and normal heart sounds.  Exam  reveals no gallop and no friction rub.   No murmur heard. Pulmonary/Chest: Effort normal and breath sounds normal. No respiratory distress. She has no wheezes. She has no rales.  Abdominal: Soft. Bowel sounds are normal. She exhibits no distension. There is no tenderness. There is no rebound and no guarding.  Musculoskeletal: Normal range of motion. She exhibits no edema or tenderness.  Lymphadenopathy:    She has no cervical adenopathy.    She has no axillary adenopathy.  Neurological: She is alert and oriented to person, place, and time. No cranial nerve deficit. Gait normal.  Skin: Skin is warm  and dry. No rash noted. No erythema. No pallor.  Psychiatric: Affect and judgment normal.  Nursing note and vitals reviewed. Breast exam deferred today per patient's wishes.  LABORATORY DATA: I have reviewed the data as listed  CBC    Component Value Date/Time   WBC 4.9 07/21/2016 1350   RBC 4.18 07/21/2016 1350   HGB 12.2 07/21/2016 1350   HCT 37.2 07/21/2016 1350   PLT 269 07/21/2016 1350   MCV 89.0 07/21/2016 1350   MCH 29.2 07/21/2016 1350   MCHC 32.8 07/21/2016 1350   RDW 13.1 07/21/2016 1350   LYMPHSABS 1.0 07/21/2016 1350   MONOABS 0.4 07/21/2016 1350   EOSABS 0.1 07/21/2016 1350   BASOSABS 0.0 07/21/2016 1350   CMP     Component Value Date/Time   NA 141 07/21/2016 1350   K 4.4 07/21/2016 1350   CL 102 07/21/2016 1350   CO2 32 07/21/2016 1350   GLUCOSE 95 07/21/2016 1350   BUN 15 07/21/2016 1350   CREATININE 1.02 (H) 07/21/2016 1350   CALCIUM 9.7 07/21/2016 1350   PROT 7.4 07/21/2016 1650   ALBUMIN 4.2 07/21/2016 1650   AST 29 07/21/2016 1650   ALT 38 07/21/2016 1650   ALKPHOS 90 07/21/2016 1650   BILITOT 0.6 07/21/2016 1650   GFRNONAA 54 (L) 07/21/2016 1350   GFRAA >60 07/21/2016 1350     ASSESSMENT and THERAPY PLAN:  Stage I Left breast cancer diagnosed 01/07/2012 ER/PR positive S/p lumpectomy, sentinel node biopsy XRT Adjuvant Arimidex Chronic  Leukopenia History of BMBX 30% cellularity without MDS, or other primary Bone marrow disorder DEXA 11/07/2014 with normal bone density  Continue Arimidex at this time, patient is tolerating it very well. She will complete her 5 years of adjuvant Arimidex in November 2018. I have ordered bilateral diagnostic mammogram to be done in August 2018. She is scheduled for a DEXA scan in July 2018. Continue calcium-vitamin D tablets daily. She will return again in 6 months with repeat laboratory studies, and office visit, and exam. On her next visit, if she is still clinically NED will discuss stopping her Arimidex at the end of November 28.  All questions were answered. The patient knows to call the clinic with any problems, questions or concerns. We can certainly see the patient much sooner if necessary.  Twana First, MD

## 2016-11-04 NOTE — Patient Instructions (Signed)
[facility] Discharge Instructions  RECOMMENDATIONS MADE BY THE CONSULTANT AND ANY TEST RESULTS WILL BE SENT TO YOUR REFERRING PHYSICIAN.  You were seen today by Dr. Louise Zhou    Thank you for choosing [facility] at [facility] to provide your oncology and hematology care.  To afford each patient quality time with our provider, please arrive at least 15 minutes before your scheduled appointment time.    If you have a lab appointment with the Cancer Center please come in thru the  Main Entrance and check in at the main information desk  You need to re-schedule your appointment should you arrive 10 or more minutes late.  We strive to give you quality time with our providers, and arriving late affects you and other patients whose appointments are after yours.  Also, if you no show three or more times for appointments you may be dismissed from the clinic at the providers discretion.     Again, thank you for choosing [facility].  Our hope is that these requests will decrease the amount of time that you wait before being seen by our physicians.       _____________________________________________________________  Should you have questions after your visit to [facility], please contact our office at [phone] between the hours of 8:30 a.m. and 4:30 p.m.  Voicemails left after 4:30 p.m. will not be returned until the following business day.  For prescription refill requests, have your pharmacy contact our office.       Resources For Cancer Patients and their Caregivers ? American Cancer Society: Can assist with transportation, wigs, general needs, runs Look Good Feel Better.        1-888-227-6333 ? Cancer Care: Provides financial assistance, online support groups, medication/co-pay assistance.  1-800-813-HOPE (4673) ? [facility] Assists [place] cancer patients and their  families through emotional , educational and financial support.  336-427-4357 ? [place] DSS Where to apply for food stamps, Medicaid and utility assistance. 336-342-1394 ? [facility]: Transportation to medical appointments. 336-347-2287 ? Social Security Administration: May apply for disability if have a Stage IV cancer. 336-342-7796 1-800-772-1213 ? [place] Aging, Disability and Transit Services: Assists with nutrition, care and transit needs. 336-349-2343  Cancer Center Support Programs: @10RELATIVEDAYS@ > Cancer Support Group  2nd Tuesday of the month 1pm-2pm, Journey Room  > Creative Journey  3rd Tuesday of the month 1130am-1pm, Journey Room  > Look Good Feel Better  1st Wednesday of the month 10am-12 noon, Journey Room (Call American Cancer Society to register 1-800-395-5775)    

## 2016-12-03 ENCOUNTER — Ambulatory Visit (INDEPENDENT_AMBULATORY_CARE_PROVIDER_SITE_OTHER): Payer: Medicare Other | Admitting: Nurse Practitioner

## 2016-12-03 ENCOUNTER — Encounter: Payer: Self-pay | Admitting: Nurse Practitioner

## 2016-12-03 VITALS — BP 164/95 | HR 71 | Ht 61.5 in | Wt 166.8 lb

## 2016-12-03 DIAGNOSIS — G20 Parkinson's disease: Secondary | ICD-10-CM | POA: Diagnosis not present

## 2016-12-03 DIAGNOSIS — R262 Difficulty in walking, not elsewhere classified: Secondary | ICD-10-CM

## 2016-12-03 MED ORDER — PRAMIPEXOLE DIHYDROCHLORIDE 1 MG PO TABS: 1.5000 mg | ORAL_TABLET | Freq: Three times a day (TID) | ORAL | 1 refills | Status: DC

## 2016-12-03 NOTE — Progress Notes (Signed)
I have reviewed and agreed above plan. 

## 2016-12-03 NOTE — Progress Notes (Signed)
[facility]  PATIENT: Lisa Torres DOB: 06-Nov-1945   REASON FOR VISIT: Follow-up for Parkinson's HISTORY FROM: Patient and husband    HISTORY OF PRESENT ILLNESS:Lisa Torres is a 71 years old right-handed female,was referred by her primary care physician Dr. 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 Her gait has changes, become slower,.  She has no anosmia, when she gets up quickly, she has lightheadedness, she is taking ativan as needed for insomnia, she has excessive movement during her sleep.  She denies significant memory trouble, she was getting tapering dose of prednisone, 20 mg, 3 tablets 5 days followed by 2 tablets 5 days followed by 1 tablets for 5 days for unclear reasons  Her findings are consistent with idiopathic Parkinson's disease, She is now taking mirapex 28m tid, it helped her right leg pain, gait difficulty, no significant side effect, MRI of the brain together, essentially normal, laboratory evaluation showed normal and negative B12, RPR, TSH, C-reactive protein, ESR, ANA, HIV  She was started on Azilect [place] Mirapex 1.5 milligrams 3 times a day since diagnosis in April,  For her complains of urinary urgency, generalized weakness, MRI of cervical spine in Oct 22 2014, showed no significant foraminal or canal stenosis.  UPDATE Mar 28 2016: She  complains of bilateral leg deep achy pain since August 2017, worsening urinary urgency, gait abnormality, she is overall happy with her current Parkinson's medications  She is taking Azilect 156mdaily, pramipexole 1.89m61mid at 8am, 12, 6pm. No significant side effect noticed  UPDATE Dec 19th 2017: She is now taking Mirapex 1.5 milligrams 3 times a day, which continued to help her, denies significant low back pain,  We have personally reviewed MRI of lumbar spine November 2017, multilevel degenerative disc disease, L2-3, there was evidence of disc bulging, facet hypertrophy, with mild to moderate spinal canal stenosis. MRI of thoracic spine, there was evidence of disc protrusion at T4-5, with slight deformity she and at the anterior spinal cord, but no evidence of cord compression,  She has high co-pay with Azilect UPDATE 06/19/2018CM Ms. GarGrater0 89ar old female returns for follow-up with history of Parkinson's disease. She is currently on Mirapex and Sinemet was added at her last visit she denies side effects to her medications she denies any falls since last seen. She denies any freezing spells. She denies any swallowing difficulty,  appetite is good. She is having problems with her right knee and is due to get knee replacement in August she is ambulating with a cane. She has quite a bit of pain with her knee  REVIEW OF SYSTEMS: Full 14 system review of systems performed and notable only for those listed, all others are neg:  Constitutional: neg  Cardiovascular: Leg swelling Ear/Nose/Throat: neg  Skin: neg Eyes: neg Respiratory: neg Gastroitestinal: neg  Hematology/Lymphatic: neg  Endocrine: neg Musculoskeletal: Walking difficulty, right knee pain Allergy/Immunology: neg Neurological: Tremors Psychiatric: neg Sleep : neg   ALLERGIES: Allergies  Allergen Reactions  . Hydrocodone   .  Tramadol     Hallucinations, abd pain    HOME MEDICATIONS: Outpatient Medications Prior to  Visit  Medication Sig Dispense Refill  . acetaminophen (TYLENOL) 500 MG tablet Take 500 mg by mouth 3 (three) times daily as needed.    Marland Kitchen amLODipine (NORVASC) 5 MG tablet Take 5 mg by mouth every morning.     Marland Kitchen anastrozole (ARIMIDEX) 1 MG tablet TAKE 1 TABLET EVERY MORNING 90 tablet 3  . calcium carbonate (OS-CAL) 600 MG TABS tablet Take 600 mg by mouth 2 (two) times daily with a meal.    . carbidopa-levodopa (SINEMET IR) 25-100 MG tablet Take 1 tablet by mouth 3 (three) times daily. 90 tablet 11  . Cholecalciferol (VITAMIN D-3) 1000 UNITS CAPS Take 1,000 Units by mouth 2 (two) times daily.    . meloxicam (MOBIC) 15 MG tablet Take 15 mg by mouth daily.    . Misc Natural Products (TART CHERRY ADVANCED PO) Take 1 tablet by mouth daily.    . pramipexole (MIRAPEX) 1 MG tablet Take 1.5 tablets (1.5 mg total) by mouth 3 (three) times daily. 405 tablet 3  . traZODone (DESYREL) 50 MG tablet TAKE 1 TABLET BY MOUT AT BEDTIME  6  . triamcinolone cream (KENALOG) 0.1 % APPLY TO AFFECTED AREA UP TO TWICE DAILY AS NEEDED (NOT TO FACE, GROIN, OR UNDERARMS)  3  . Turmeric Curcumin 500 MG CAPS Take 1 capsule by mouth daily.    . Wheat Dextrin (BENEFIBER DRINK MIX PO) Take 1 scoop by mouth daily.    Marland Kitchen levothyroxine (SYNTHROID, LEVOTHROID) 112 MCG tablet Take 1 tablet (112 mcg total) by mouth daily before breakfast. 30 tablet 0  . magnesium citrate SOLN Take 296 mLs (1 Bottle total) by mouth once as needed for severe constipation. (Patient not taking: Reported on 12/03/2016) 195 mL 0  . polyethylene glycol powder (GLYCOLAX/MIRALAX) powder Take 17 g by mouth daily. Dissolve one capsule powder into any liquid intake once daily. If no good effect in 3 days, take twice daily. Can increased to three times daily as needed (Patient not taking: Reported on 12/03/2016) 500 g 0   No facility-administered medications prior to visit.     PAST MEDICAL HISTORY: Past Medical History:  Diagnosis Date  . Breast cancer ([place])  11/2011  . Chronic back pain   . Chronic leg pain    bilateral  . Chronic leukopenia   . DDD (degenerative disc disease), cervical   . DDD (degenerative disc disease), lumbar   . Gait abnormality   . GERD (gastroesophageal reflux disease)   . Hypertension   . Parkinson's disease ([place])   . Radiation 04/2012   33 treatments for breast cancer  . Right knee pain   . Thyroid disease     PAST SURGICAL HISTORY: Past Surgical History:  Procedure Laterality Date  . ABDOMINAL HYSTERECTOMY     partial  . BREAST LUMPECTOMY Left 12/2011  . CHOLECYSTECTOMY    . COLONOSCOPY N/A 07/01/2013   Procedure: COLONOSCOPY;  Surgeon: Rogene Houston, MD;  [facility];  Service: Endoscopy;  Laterality: N/A;  1030  . TUBAL LIGATION      FAMILY HISTORY: Family History  Problem Relation Age of Onset  . Cancer Mother 78       colon cancer  . Cancer Father 75       throat cancer  . Pancreatic cancer Brother        63y 11-14-15    SOCIAL HISTORY: Social History  Social History  . Marital status: Married    Spouse name: Marcello Moores  . Number of children: 3  . Years of education: 22 th   Occupational History  .      retired   Social History Main Topics  . Smoking status: Former Smoker    Packs/day: 0.50    Years: 18.00    Types: Cigarettes    Quit date: 09/30/1998  . Smokeless tobacco: Never Used  . Alcohol use No  . Drug use: No  . Sexual activity: Not on file   Other Topics Concern  . Not on file   Social History Narrative   Patient lives at home with her husband Marcello Moores).    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   Right handed     PHYSICAL EXAM  Vitals:   12/03/16 1250  BP: (!) 164/95  Pulse: 71  Weight: 166 lb 12.8 oz (75.7 kg)  Height: 5' 1.5" (1.562 m)   Body mass index is 31.01 kg/m.  Generalized: Well developed, in no acute distress  Head: normocephalic and atraumatic,. Oropharynx benign  Neck: Supple,  Cardiac: Regular rate rhythm, no murmur    Musculoskeletal: No deformity  Skin 1+ edema right foot  Neurological examination   Mentation: Alert oriented to time, place, history taking. Attention span and concentration appropriate. Recent and remote memory intact.  Follows all commands speech and language fluent.   Cranial nerve II-XII: Pupils were equal round reactive to light extraocular movements were full, visual field were full on confrontational test. Facial sensation and strength were normal. hearing was intact to finger rubbing bilaterally. Uvula tongue midline. head turning and shoulder shrug were normal and symmetric.Tongue protrusion into cheek strength was normal. Motor: She has mild right more than left limb and nuchal rigidity no bradykinesia  Sensory: normal and symmetric to light touch, in the upper and lower extremities  Coordination: finger-nose-finger, heel-to-shin bilaterally, no dysmetria, no resting tremor Reflexes: Brachioradialis 2/2, biceps 2/2, triceps 2/2, patellar 2/2, Achilles 2/2, plantar responses were flexor bilaterally. Gait and Station: Rising up from seated position with push off, stiff cautious gait, limping on the right due to knee pain  DIAGNOSTIC DATA (LABS, IMAGING, TESTING) - I reviewed patient records, labs, notes, testing and imaging myself where available.  Lab Results  Component Value Date   WBC 4.9 07/21/2016   HGB 12.2 07/21/2016   HCT 37.2 07/21/2016   MCV 89.0 07/21/2016   PLT 269 07/21/2016      Component Value Date/Time   NA 141 07/21/2016 1350   K 4.4 07/21/2016 1350   CL 102 07/21/2016 1350   CO2 32 07/21/2016 1350   GLUCOSE 95 07/21/2016 1350   BUN 15 07/21/2016 1350   CREATININE 1.02 (H) 07/21/2016 1350   CALCIUM 9.7 07/21/2016 1350   PROT 7.4 07/21/2016 1650   ALBUMIN 4.2 07/21/2016 1650   AST 29 07/21/2016 1650   ALT 38 07/21/2016 1650   ALKPHOS 90 07/21/2016 1650   BILITOT 0.6 07/21/2016 1650   GFRNONAA 54 (L) 07/21/2016 1350   GFRAA >60 07/21/2016 1350      Lab Results  Component Value Date   TSH 26.648 (H) 06/22/2016      ASSESSMENT AND PLAN  71 y.o. year old female  has a past medical history of  Chronic back pain; Chronic leg pain; Chronic leukopenia; DDD (degenerative disc disease), cervical; DDD (degenerative disc disease), lumbar; Gait abnormality; GERD (gastroesophageal reflux disease); Hypertension; Parkinson's disease ([place]); Radiation (04/2012); Right knee  pain; and Thyroid disease. here to follow-up for her idiopathic Parkinson's disease.              Continue  pramipexole 1.5 milligrams 3 times a day will refill Add on Sinemet 25/100 3 times a day, At risk for falls use cane at all times F/U in 6 months I spent 25 minutes in total face to face time with the patient more than 50% of which was spent counseling and coordination of care, reviewing test results reviewing medications and discussing and reviewing the diagnosis of Parkinson's disease and further treatment options. Due to knee pain is at risk for falls , Dennie Bible, [facility], [facility], APRN  [facility] [address] [place], [place] [address] [phone]

## 2016-12-03 NOTE — Patient Instructions (Addendum)
Continue  pramipexole 1.5 milligrams 3 times a day Add on Sinemet 25/100 3 times a day, At risk for falls use cane  F/U in 6 months

## 2017-01-05 ENCOUNTER — Emergency Department (HOSPITAL_COMMUNITY): Payer: Medicare Other

## 2017-01-05 ENCOUNTER — Encounter (HOSPITAL_COMMUNITY): Payer: Self-pay | Admitting: Emergency Medicine

## 2017-01-05 ENCOUNTER — Observation Stay (HOSPITAL_COMMUNITY)
Admission: EM | Admit: 2017-01-05 | Discharge: 2017-01-07 | Disposition: A | Discharge status: Home / Self Care | Payer: Medicare Other | Attending: Internal Medicine | Admitting: Internal Medicine

## 2017-01-05 DIAGNOSIS — Z87891 Personal history of nicotine dependence: Secondary | ICD-10-CM | POA: Insufficient documentation

## 2017-01-05 DIAGNOSIS — I10 Essential (primary) hypertension: Secondary | ICD-10-CM | POA: Diagnosis not present

## 2017-01-05 DIAGNOSIS — G20A1 Parkinson's disease without dyskinesia, without mention of fluctuations: Secondary | ICD-10-CM | POA: Diagnosis present

## 2017-01-05 DIAGNOSIS — Z8719 Personal history of other diseases of the digestive system: Secondary | ICD-10-CM | POA: Insufficient documentation

## 2017-01-05 DIAGNOSIS — R079 Chest pain, unspecified: Secondary | ICD-10-CM | POA: Diagnosis present

## 2017-01-05 DIAGNOSIS — E039 Hypothyroidism, unspecified: Secondary | ICD-10-CM | POA: Diagnosis not present

## 2017-01-05 DIAGNOSIS — G20 Parkinson's disease: Secondary | ICD-10-CM | POA: Insufficient documentation

## 2017-01-05 DIAGNOSIS — Z170 Estrogen receptor positive status [ER+]: Secondary | ICD-10-CM | POA: Diagnosis not present

## 2017-01-05 DIAGNOSIS — C50912 Malignant neoplasm of unspecified site of left female breast: Secondary | ICD-10-CM | POA: Diagnosis present

## 2017-01-05 DIAGNOSIS — Z853 Personal history of malignant neoplasm of breast: Secondary | ICD-10-CM | POA: Insufficient documentation

## 2017-01-05 DIAGNOSIS — R072 Precordial pain: Secondary | ICD-10-CM | POA: Diagnosis not present

## 2017-01-05 DIAGNOSIS — C50012 Malignant neoplasm of nipple and areola, left female breast: Secondary | ICD-10-CM | POA: Diagnosis not present

## 2017-01-05 LAB — TROPONIN I

## 2017-01-05 LAB — CBC
HEMATOCRIT: 37 % (ref 36.0–46.0)
HEMOGLOBIN: 12.1 g/dL (ref 12.0–15.0)
MCH: 28.3 pg (ref 26.0–34.0)
MCHC: 32.7 g/dL (ref 30.0–36.0)
MCV: 86.7 fL (ref 78.0–100.0)
Platelets: 171 10*3/uL (ref 150–400)
RBC: 4.27 MIL/uL (ref 3.87–5.11)
RDW: 12.5 % (ref 11.5–15.5)
WBC: 3.4 10*3/uL — AB (ref 4.0–10.5)

## 2017-01-05 LAB — BASIC METABOLIC PANEL
ANION GAP: 8 (ref 5–15)
BUN: 9 mg/dL (ref 6–20)
CO2: 27 mmol/L (ref 22–32)
Calcium: 9.6 mg/dL (ref 8.9–10.3)
Chloride: 104 mmol/L (ref 101–111)
Creatinine, Ser: 0.82 mg/dL (ref 0.44–1.00)
GLUCOSE: 94 mg/dL (ref 65–99)
POTASSIUM: 3.7 mmol/L (ref 3.5–5.1)
Sodium: 139 mmol/L (ref 135–145)

## 2017-01-05 LAB — I-STAT TROPONIN, ED: TROPONIN I, POC: 0 ng/mL (ref 0.00–0.08)

## 2017-01-05 LAB — D-DIMER, QUANTITATIVE (NOT AT ARMC): D DIMER QUANT: 0.7 ug{FEU}/mL — AB (ref 0.00–0.50)

## 2017-01-05 MED ORDER — ENOXAPARIN SODIUM 40 MG/0.4ML ~~LOC~~ SOLN
40.0000 mg | SUBCUTANEOUS | Status: DC
  Administered 2017-01-05 – 2017-01-06 (×2): 40 mg via SUBCUTANEOUS
  Filled 2017-01-05 (×2): qty 0.4

## 2017-01-05 MED ORDER — ASPIRIN 81 MG PO CHEW
324.0000 mg | CHEWABLE_TABLET | Freq: Once | ORAL | Status: AC
  Administered 2017-01-05: 324 mg via ORAL
  Filled 2017-01-05: qty 4

## 2017-01-05 MED ORDER — DOCUSATE SODIUM 100 MG PO CAPS
100.0000 mg | ORAL_CAPSULE | Freq: Every day | ORAL | Status: DC
  Administered 2017-01-05 – 2017-01-07 (×3): 100 mg via ORAL
  Filled 2017-01-05 (×3): qty 1

## 2017-01-05 MED ORDER — PRAMIPEXOLE DIHYDROCHLORIDE 1 MG PO TABS
1.5000 mg | ORAL_TABLET | Freq: Three times a day (TID) | ORAL | Status: DC
  Administered 2017-01-06 (×3): 1.5 mg via ORAL
  Administered 2017-01-07: 1 mg via ORAL
  Filled 2017-01-05: qty 1
  Filled 2017-01-05 (×4): qty 2

## 2017-01-05 MED ORDER — ANASTROZOLE 1 MG PO TABS
1.0000 mg | ORAL_TABLET | Freq: Every morning | ORAL | Status: DC
  Administered 2017-01-06 – 2017-01-07 (×2): 1 mg via ORAL
  Filled 2017-01-05 (×3): qty 1

## 2017-01-05 MED ORDER — NITROGLYCERIN 0.4 MG SL SUBL
0.4000 mg | SUBLINGUAL_TABLET | Freq: Once | SUBLINGUAL | Status: AC
  Administered 2017-01-05: 0.4 mg via SUBLINGUAL
  Filled 2017-01-05: qty 1

## 2017-01-05 MED ORDER — GI COCKTAIL ~~LOC~~: 30.0000 mL | Freq: Four times a day (QID) | ORAL | Status: DC | PRN

## 2017-01-05 MED ORDER — ACETAMINOPHEN 325 MG PO TABS
650.0000 mg | ORAL_TABLET | ORAL | Status: DC | PRN
  Administered 2017-01-05 – 2017-01-06 (×2): 650 mg via ORAL
  Filled 2017-01-05 (×2): qty 2

## 2017-01-05 MED ORDER — CARBIDOPA-LEVODOPA 25-100 MG PO TABS
1.0000 | ORAL_TABLET | Freq: Three times a day (TID) | ORAL | Status: DC
  Administered 2017-01-05 – 2017-01-07 (×5): 1 via ORAL
  Filled 2017-01-05 (×7): qty 1

## 2017-01-05 MED ORDER — MORPHINE SULFATE (PF) 2 MG/ML IV SOLN: 2.0000 mg | INTRAVENOUS | Status: DC | PRN

## 2017-01-05 MED ORDER — LEVOTHYROXINE SODIUM 100 MCG PO TABS
100.0000 ug | ORAL_TABLET | Freq: Every day | ORAL | Status: DC
  Administered 2017-01-06 – 2017-01-07 (×2): 100 ug via ORAL
  Filled 2017-01-05 (×2): qty 1

## 2017-01-05 MED ORDER — IOPAMIDOL (ISOVUE-370) INJECTION 76%
100.0000 mL | Freq: Once | INTRAVENOUS | Status: AC | PRN
  Administered 2017-01-05: 100 mL via INTRAVENOUS

## 2017-01-05 MED ORDER — HYDRALAZINE HCL 20 MG/ML IJ SOLN: 10.0000 mg | Freq: Four times a day (QID) | INTRAMUSCULAR | Status: DC | PRN

## 2017-01-05 MED ORDER — ASPIRIN EC 325 MG PO TBEC
325.0000 mg | DELAYED_RELEASE_TABLET | Freq: Every day | ORAL | Status: DC
  Administered 2017-01-06 – 2017-01-07 (×2): 325 mg via ORAL
  Filled 2017-01-05 (×2): qty 1

## 2017-01-05 MED ORDER — TRAZODONE HCL 50 MG PO TABS
50.0000 mg | ORAL_TABLET | Freq: Every evening | ORAL | Status: DC | PRN
  Administered 2017-01-07: 50 mg via ORAL
  Filled 2017-01-05: qty 1

## 2017-01-05 MED ORDER — ONDANSETRON HCL 4 MG/2ML IJ SOLN
4.0000 mg | Freq: Four times a day (QID) | INTRAMUSCULAR | Status: DC | PRN
  Administered 2017-01-06: 4 mg via INTRAVENOUS
  Filled 2017-01-05: qty 2

## 2017-01-05 MED ORDER — SODIUM CHLORIDE 0.9 % IV BOLUS (SEPSIS)
500.0000 mL | Freq: Once | INTRAVENOUS | Status: AC
  Administered 2017-01-05: 500 mL via INTRAVENOUS

## 2017-01-05 NOTE — ED Notes (Signed)
Pt complains of headache and neck pain

## 2017-01-05 NOTE — ED Notes (Addendum)
Awaiting bed assignment.

## 2017-01-05 NOTE — ED Notes (Signed)
hospitalist in to assess  

## 2017-01-05 NOTE — ED Notes (Signed)
TTS in progress 

## 2017-01-05 NOTE — ED Notes (Signed)
Pt ate 100 per cent of meal

## 2017-01-05 NOTE — ED Triage Notes (Signed)
Patient c/o central chest pain/epigastric pain that started today at 11:30am while at home. Per patient shortness of breath, nausea, and weakness. Denies any cardiac hx. Denies any coughing.

## 2017-01-05 NOTE — ED Notes (Signed)
Out of bed to [facility] 

## 2017-01-05 NOTE — ED Notes (Signed)
Per hospitalist, if 2nd troponin is negative, pt will remain at [facility]  Should it be positive, pt will need to be transferred

## 2017-01-05 NOTE — ED Notes (Signed)
Pt's sister is Adonis Huguenin her phone number is [phone].

## 2017-01-05 NOTE — ED Notes (Signed)
While RN attempting IV access, pt calling on cell phone unknown persons stating, "if you want to see me, you had better come on"  She denies recent stressors  Points to epigastric region as pain site

## 2017-01-05 NOTE — H&P (Signed)
[facility] History and Physical  Lisa Torres YBO:[id number] DOB: 02-18-1946 DOA: 01/05/2017   PCP: Lisa Hilding, MD  Specialists: Followed by orthopedics. Also followed by oncology and neurology.  Chief Complaint: Chest pain  HPI: Lisa Torres is a 71 y.o. female with a past medical history of hypertension, she is a former smoker, history of for Parkinson's disease which was recently diagnosed, history of breast cancer, who was in her usual state of health till this morning when she got up and felt not her usual. She felt tired. She started having some discomfort in the lower part of her chest at the center. Felt like a tightness. Started radiating to the left side of her chest. Denies any fever, no chills. She had some nausea but no vomiting. Did have some shortness of breath. No aggravating or relieving factors otherwise. Her husband came back from the grocery store and the patient mentioned her symptoms to him. By then, her chest tightness was 10/10 in intensity. It was radiating to the neck. No radiation to the arm. She has not had similar symptoms before. She denies any acid reflux. She does get occasional GERD. Does not take any medications for the same. It appears that she was brought in by EMS. She was given aspirin, nitroglycerin, morphine. Pain did get better, to about 4 out of 10 in intensity.  Home Medications: Prior to Admission medications   Medication Sig Start Date End Date Taking? Authorizing Provider  acetaminophen (TYLENOL) 500 MG tablet Take 500 mg by mouth 3 (three) times daily as needed.   Yes [provider]  amLODipine (NORVASC) 5 MG tablet Take 5 mg by mouth every morning.    Yes [provider]  anastrozole (ARIMIDEX) 1 MG tablet TAKE 1 TABLET EVERY MORNING 05/26/16  Yes Torres, Lisa Fam, MD  calcium carbonate (OS-CAL) 600 MG TABS tablet Take 600 mg by mouth 2 (two) times daily with a meal.   Yes [provider]    carbidopa-levodopa (SINEMET IR) 25-100 MG tablet Take 1 tablet by mouth 3 (three) times daily. 06/04/16  Yes Lisa Pacas, MD  Cholecalciferol (VITAMIN D-3) 1000 UNITS CAPS Take 1,000 Units by mouth 2 (two) times daily.   Yes [provider]  docusate sodium (COLACE) 100 MG capsule Take 100 mg by mouth daily.   Yes [provider]  levothyroxine (SYNTHROID, LEVOTHROID) 100 MCG tablet Take 100 mcg by mouth daily before breakfast.   Yes [provider]  Melatonin 3 MG CAPS Take 3 mg by mouth at bedtime.   Yes [provider]  meloxicam (MOBIC) 15 MG tablet Take 15 mg by mouth daily.   Yes [provider]  Misc Natural Products (TART CHERRY ADVANCED PO) Take 1 tablet by mouth daily.   Yes [provider]  pramipexole (MIRAPEX) 1 MG tablet Take 1.5 tablets (1.5 mg total) by mouth 3 (three) times daily. 12/03/16  Yes Lisa Bible, NP  traZODone (DESYREL) 50 MG tablet TAKE 1 TABLET BY MOUT AT BEDTIME AS NEEDED FOR SLEEP 11/08/15  Yes [provider]  triamcinolone cream (KENALOG) 0.1 % APPLY TO AFFECTED AREA UP TO TWICE DAILY AS NEEDED (NOT TO FACE, GROIN, OR UNDERARMS) 03/12/16  Yes [provider]  Turmeric Curcumin 500 MG CAPS Take 1 capsule by mouth daily.   Yes [provider]    Allergies:  Allergies  Allergen Reactions  . Hydrocodone   . Tramadol     Hallucinations, abd pain  Past Medical History: Past Medical History:  Diagnosis Date  . Breast cancer ([place]) 11/2011  . Chronic back pain   . Chronic leg pain    bilateral  . Chronic leukopenia   . DDD (degenerative disc disease), cervical   . DDD (degenerative disc disease), lumbar   . Gait abnormality   . GERD (gastroesophageal reflux disease)   . Hypertension   . Parkinson's disease ([place])   . Radiation 04/2012   33 treatments for breast cancer  . Right knee pain   . Thyroid disease     Past Surgical History:  Procedure Laterality Date  .  ABDOMINAL HYSTERECTOMY     partial  . BREAST LUMPECTOMY Left 12/2011  . CHOLECYSTECTOMY    . COLONOSCOPY N/A 07/01/2013   Procedure: COLONOSCOPY;  Surgeon: Rogene Houston, MD;  Location[facility];  Service: Endoscopy;  Laterality: N/A;  1030  . TUBAL LIGATION      Social History: She lives with her family. Quit smoking in 2000. Denies any alcohol use or illicit drug use. Uses a cane to ambulate.  Family History:  Family History  Problem Relation Age of Onset  . Cancer Mother 63       colon cancer  . Cancer Father 75       throat cancer  . Pancreatic cancer Brother        63y 11-14-15     Review of Systems - History obtained from the patient General ROS: positive for  - fatigue Psychological ROS: negative Ophthalmic ROS: negative ENT ROS: negative Allergy and Immunology ROS: negative Hematological and Lymphatic ROS: negative Endocrine ROS: negative Respiratory ROS: as in hpi Cardiovascular ROS: as in hpi Gastrointestinal ROS: no abdominal pain, change in bowel habits, or black or bloody stools Genito-Urinary ROS: no dysuria, trouble voiding, or hematuria Musculoskeletal ROS: negative Neurological ROS: no TIA or stroke symptoms Dermatological ROS: negative  Physical Examination  Vitals:   01/05/17 1700 01/05/17 1730 01/05/17 1830 01/05/17 1900  BP: (!) 159/69 (!) 174/68 (!) 150/78 138/64  Pulse: 65 80  70  Resp: 18 17 20 17   Temp:      TempSrc:      SpO2: 99% 100%  100%  Weight:      Height:        BP 138/64   Pulse 70   Temp 97.8 F (36.6 C) (Oral)   Resp 17   Ht 5' 1.5" (1.562 m)   Wt 73.9 kg (163 lb)   SpO2 100%   BMI 30.30 kg/m   General appearance: alert, cooperative, appears stated age and no distress Head: Normocephalic, without obvious abnormality, atraumatic Eyes: conjunctivae/corneas clear. PERRL, EOM's intact. Throat: lips, mucosa, and tongue normal; teeth and gums normal Neck: no adenopathy, no carotid bruit, no JVD, supple, symmetrical,  trachea midline and thyroid not enlarged, symmetric, no tenderness/mass/nodules Resp: clear to auscultation bilaterally Chest wall: no tenderness Cardio: regular rate and rhythm, S1, S2 normal, no murmur, click, rub or gallop GI: soft, non-tender; bowel sounds normal; no masses,  no organomegaly Extremities: extremities normal, atraumatic, no cyanosis or edema Pulses: 2+ and symmetric Skin: Skin color, texture, turgor normal. No rashes or lesions Lymph nodes: Cervical, supraclavicular, and axillary nodes normal. Neurologic: No focal neurological deficits.   Labs on Admission: I have personally reviewed following labs and imaging studies  CBC:  Recent Labs Lab 01/05/17 1355  WBC 3.4*  HGB 12.1  HCT 37.0  MCV 86.7  PLT 161   Basic Metabolic  Panel:  Recent Labs Lab 01/05/17 1355  NA 139  K 3.7  CL 104  CO2 27  GLUCOSE 94  BUN 9  CREATININE 0.82  CALCIUM 9.6   GFR: Estimated Creatinine Clearance: 59.5 mL/min (by C-G formula based on SCr of 0.82 mg/dL).  Cardiac Enzymes:  Recent Labs Lab 01/05/17 1355 01/05/17 1836  TROPONINI <0.03 <0.03    Radiological Exams on Admission: Ct Angio Chest Pe W And/or Wo Contrast  Result Date: 01/05/2017 CLINICAL DATA:  Chest pain history of breast cancer EXAM: CT ANGIOGRAPHY CHEST WITH CONTRAST TECHNIQUE: Multidetector CT imaging of the chest was performed using the standard protocol during bolus administration of intravenous contrast. Multiplanar CT image reconstructions and MIPs were obtained to evaluate the vascular anatomy. CONTRAST:  100 mL Isovue 370 intravenous COMPARISON:  Radiograph 01/05/2017, 07/21/2016 FINDINGS: Cardiovascular: Satisfactory opacification of the pulmonary arteries to the segmental level. No evidence of pulmonary embolism. Non aneurysmal aorta. Atherosclerotic calcifications. No dissection is seen. Coronary artery calcification. Heart size within normal limits. No pericardial effusion. Mediastinum/Nodes:  Midline trachea. 11 mm hypodense nodule left lobe of thyroid. No significantly enlarged mediastinal or hilar nodes. Esophagus within normal limits. Lungs/Pleura: Mild emphysema. No acute consolidation or pleural effusion. No pneumothorax. Upper Abdomen: No acute abnormality. Musculoskeletal: No chest wall abnormality. No acute or significant osseous findings. Review of the MIP images confirms the above findings. IMPRESSION: 1. Negative for acute pulmonary embolus or aortic dissection 2. Mild emphysema.  No acute infiltrates 3. 11 mm hypodense nodule left lobe of thyroid, may be correlated with nonemergent thyroid ultrasound if clinically appropriate. Aortic Atherosclerosis (ICD10-I70.0) and Emphysema (ICD10-J43.9). Electronically Signed   By: Donavan Foil M.D.   On: 01/05/2017 17:57   Dg Chest Port 1 View  Result Date: 01/05/2017 CLINICAL DATA:  Central chest and epigastric pain. EXAM: PORTABLE CHEST 1 VIEW COMPARISON:  07/10/2016 FINDINGS: 1404 hours. The lungs are clear without focal pneumonia, edema, pneumothorax or pleural effusion. The cardiopericardial silhouette is within normal limits for size. The visualized bony structures of the thorax are intact. Telemetry leads overlie the chest. IMPRESSION: No active disease. Electronically Signed   By: Misty Stanley M.D.   On: 01/05/2017 14:25    My interpretation of Electrocardiogram: Sinus rhythm in the 70s. Normal axis. Intervals appear to be normal. No concerning ST or T-wave changes.   Problem List  Principal Problem:   Chest pain Active Problems:   Hypothyroid   Hypertension   Parkinson disease ([place])   Breast cancer, left breast ([facility])   Assessment: This is a 71 year old African-American female with a past medical history as stated earlier, who presents with chest pain. Patient does have a few risk factors for CAD including age, former smoking history, hypertension. There are certain components of her history, which are concerning as well.  She did have significantly elevated blood pressure when she arrived at the emergency department. D-dimer was noted to be elevated and so she underwent CT scan which was negative for PE.  Plan: #1 chest pain: Heart score at least 4. CT scan negative for PE. CT EKG did not show any obvious ischemic changes. EKG will be repeated in the morning. Troponins will be trended. Continue aspirin. Consult cardiology as she may need to undergo stress testing. Keep her nothing by mouth past midnight.  #2 history of breast cancer: Continue with her home medications.  #3 history of essential hypertension: Blood pressure was noted to be quite elevated when she was initially evaluated in the emergency  department. This could've contributed as well. Unknown if she is compliant with medications. Continue to monitor.  #4 Finding of a left thyroid nodule: This was incidentally seen on CT scan. It appears that the patient has a history of toxic goiter and has undergone RAI treatment in the past. This can be pursued as an outpatient by her PCP. Continue her levothyroxin.  #5 Recently diagnosed Parkinson's: Followed by Dr. Krista Blue with neurology. Continue her home medications.   DVT Prophylaxis: Lovenox Code Status: Full code Family Communication: Discussed with the patient  Consults called: Cardiology   Severity of Illness: The appropriate patient status for this patient is OBSERVATION. Observation status is judged to be reasonable and necessary in order to provide the required intensity of service to ensure the patient's safety. The patient's presenting symptoms, physical exam findings, and initial radiographic and laboratory data in the context of their medical condition is felt to place them at decreased risk for further clinical deterioration. Furthermore, it is anticipated that the patient will be medically stable for discharge from the hospital within 2 midnights of admission. The following factors support the patient  status of observation.   " The patient's presenting symptoms include chest pain. " The physical exam findings include elevated blood pressure. " The initial radiographic and laboratory data are unremarkable.   Further management decisions will depend on results of further testing and patient's response to treatment.   [facility]  [facility] Pager [phone]  If 7PM-7AM, please contact night-coverage [email or web address] Password [email or web address]  01/05/2017, 7:35 PM

## 2017-01-05 NOTE — ED Provider Notes (Signed)
Emergency Department Provider Note   I have reviewed the triage vital signs and the nursing notes.   HISTORY  Chief Complaint Chest Pain   HPI Lisa Torres is a 71 y.o. female with PMH of chronic leukemia, GERD, HTN, and chronic right knee pain who presents to the emergency room for evaluation of sudden onset central chest pain radiating to the left chest and neck. Symptoms began suddenly at approximately 11:30 AM today. Patient denies any associated dyspnea. She did have some mild diaphoresis. No modifying factors. No pleuritic quality to pain. No fevers, chills, productive cough. No similar pain in the past. No radiation to the back or abdomen. The patient has noticed some slightly reduced right lower extremity that has been present with her chronic right knee pain. She is following with an outside provider for consideration of knee replacement on the right.    Past Medical History:  Diagnosis Date  . Breast cancer ([place]) 11/2011  . Chronic back pain   . Chronic leg pain    bilateral  . Chronic leukopenia   . DDD (degenerative disc disease), cervical   . DDD (degenerative disc disease), lumbar   . Gait abnormality   . GERD (gastroesophageal reflux disease)   . Hypertension   . Parkinson's disease ([place])   . Radiation 04/2012   33 treatments for breast cancer  . Right knee pain   . Thyroid disease     Patient Active Problem List   Diagnosis Date Noted  . Breast cancer, left breast ([place]) 11/02/2015  . Difficulty walking 10/20/2013  . Parkinson disease ([place]) 08/24/2013  . Hypothyroid 05/05/2013  . Hypertension 05/05/2013    Past Surgical History:  Procedure Laterality Date  . ABDOMINAL HYSTERECTOMY     partial  . BREAST LUMPECTOMY Left 12/2011  . CHOLECYSTECTOMY    . COLONOSCOPY N/A 07/01/2013   Procedure: COLONOSCOPY;  Surgeon: Rogene Houston, MD;  Location: [facility];  Service: Endoscopy;  Laterality: N/A;  1030  . TUBAL LIGATION      Current  Outpatient Rx  . Order #: [id number] Class: Historical Med  . Order #: [id number] Class: Historical Med  . Order #: [id number] Class: Normal  . Order #: [id number] Class: Historical Med  . Order #: [id number] Class: Normal  . Order #: [id number] Class: Historical Med  . Order #: [id number] Class: Historical Med  . Order #: [id number] Class: Historical Med  . Order #: [id number] Class: Historical Med  . Order #: [id number] Class: Historical Med  . Order #: [id number] Class: Normal  . Order #: [id number] Class: Historical Med  . Order #: [id number] Class: Historical Med  . Order #: [id number] Class: Historical Med  . Order #: [id number] Class: Historical Med    Allergies Hydrocodone and Tramadol  Family History  Problem Relation Age of Onset  . Cancer Mother 39       colon cancer  . Cancer Father 75       throat cancer  . Pancreatic cancer Brother        [phone] 11-14-15    Social History Social History  Substance Use Topics  . Smoking status: Former Smoker    Packs/day: 0.50    Years: 18.00    Types: Cigarettes    Quit date: 09/30/1998  . Smokeless tobacco: Never Used  . Alcohol use No    Review of Systems  Constitutional: No fever/chills Eyes: No visual changes. ENT: No sore throat. Cardiovascular: Positive chest pain. Respiratory: Denies shortness of breath. Gastrointestinal: No abdominal pain.  No nausea, no vomiting.  No  diarrhea.  No constipation. Genitourinary: Negative for dysuria. Musculoskeletal: Negative for back pain. Positive right knee pain.  Skin: Negative for rash. Neurological: Negative for headaches, focal weakness or numbness.  10-point ROS otherwise negative.  ____________________________________________   PHYSICAL EXAM:  VITAL SIGNS: ED Triage Vitals  Enc Vitals Group     BP 01/05/17 1356 (S) (!) 200/76     Pulse Rate 01/05/17 1356 82     Resp 01/05/17 1356 20     Temp 01/05/17 1356 97.8 F (36.6 C)     Temp Source 01/05/17 1356 Oral     SpO2 01/05/17 1356 100 %      Weight 01/05/17 1353 163 lb (73.9 kg)     Height 01/05/17 1353 5' 1.5" (1.562 m)     Pain Score 01/05/17 1352 10   Constitutional: Alert and oriented. Appears uncomfortable.  Eyes: Conjunctivae are normal.  Head: Atraumatic. Nose: No congestion/rhinnorhea. Mouth/Throat: Mucous membranes are moist.   Neck: No stridor.  Cardiovascular: Normal rate, regular rhythm. Good peripheral circulation. Grossly normal heart sounds.   Respiratory: Normal respiratory effort.  No retractions. Lungs CTAB. Gastrointestinal: Soft and nontender. No distention.  Musculoskeletal: No lower extremity tenderness nor edema. No gross deformities of extremities. Neurologic:  Normal speech and language. No gross focal neurologic deficits are appreciated.  Skin:  Skin is warm, dry and intact. No rash noted. Psychiatric: Mood and affect are normal. Speech and behavior are normal.  ____________________________________________   LABS (all labs ordered are listed, but only abnormal results are displayed)  Labs Reviewed  CBC - Abnormal; Notable for the following:       Result Value   WBC 3.4 (*)    All other components within normal limits  D-DIMER, QUANTITATIVE (NOT AT [facility]) - Abnormal; Notable for the following:    D-Dimer, Quant 0.70 (*)    All other components within normal limits  BASIC METABOLIC PANEL  TROPONIN I  TROPONIN I  I-STAT TROPONIN, ED   ____________________________________________  EKG   EKG Interpretation  Date/Time:  Sunday January 05 2017 13:53:52 EDT Ventricular Rate:  79 PR Interval:    QRS Duration: 88 QT Interval:  404 QTC Calculation: 464 R Axis:   165 Text Interpretation:  Right and left arm electrode reversal, interpretation assumes no reversal Sinus or ectopic atrial rhythm Probable lateral infarct, age indeterminate No STEMI. Similar to prior.  Confirmed by Nanda Quinton [phone]) on 01/05/2017 2:00:01 PM       ____________________________________________  RADIOLOGY  Ct  Angio Chest Pe W And/or Wo Contrast  Result Date: 01/05/2017 CLINICAL DATA:  Chest pain history of breast cancer EXAM: CT ANGIOGRAPHY CHEST WITH CONTRAST TECHNIQUE: Multidetector CT imaging of the chest was performed using the standard protocol during bolus administration of intravenous contrast. Multiplanar CT image reconstructions and MIPs were obtained to evaluate the vascular anatomy. CONTRAST:  100 mL Isovue 370 intravenous COMPARISON:  Radiograph 01/05/2017, 07/21/2016 FINDINGS: Cardiovascular: Satisfactory opacification of the pulmonary arteries to the segmental level. No evidence of pulmonary embolism. Non aneurysmal aorta. Atherosclerotic calcifications. No dissection is seen. Coronary artery calcification. Heart size within normal limits. No pericardial effusion. Mediastinum/Nodes: Midline trachea. 11 mm hypodense nodule left lobe of thyroid. No significantly enlarged mediastinal or hilar nodes. Esophagus within normal limits. Lungs/Pleura: Mild emphysema. No acute consolidation or pleural effusion. No pneumothorax. Upper Abdomen: No acute abnormality. Musculoskeletal: No chest wall abnormality. No acute or significant osseous findings. Review of the MIP images confirms the above findings. IMPRESSION: 1. Negative for acute  pulmonary embolus or aortic dissection 2. Mild emphysema.  No acute infiltrates 3. 11 mm hypodense nodule left lobe of thyroid, may be correlated with nonemergent thyroid ultrasound if clinically appropriate. Aortic Atherosclerosis (ICD10-I70.0) and Emphysema (ICD10-J43.9). Electronically Signed   By: Donavan Foil M.D.   On: 01/05/2017 17:57   Dg Chest Port 1 View  Result Date: 01/05/2017 CLINICAL DATA:  Central chest and epigastric pain. EXAM: PORTABLE CHEST 1 VIEW COMPARISON:  07/10/2016 FINDINGS: 1404 hours. The lungs are clear without focal pneumonia, edema, pneumothorax or pleural effusion. The cardiopericardial silhouette is within normal limits for size. The visualized bony  structures of the thorax are intact. Telemetry leads overlie the chest. IMPRESSION: No active disease. Electronically Signed   By: Misty Stanley M.D.   On: 01/05/2017 14:25    ____________________________________________   PROCEDURES  Procedure(s) performed:   Procedures  None ____________________________________________   INITIAL IMPRESSION / ASSESSMENT AND PLAN / ED COURSE  Pertinent labs & imaging results that were available during my care of the patient were reviewed by me and considered in my medical decision making (see chart for details).  Patient resents emergency pertinent for evaluation of sudden onset, severe central chest pain radiating to the left chest and neck. Symptoms began 2-3 hours prior to ED presentation. She continues to have pain. Aspirin and nitroglycerin given on arrival. EKG with no evidence of acute ischemia. Patient mentions some worsening lower extremity swelling possibly related to chronic right knee pain. Swelling has been intermittent but seems to worsened recently. My suspicion for PE remains low with no hypoxemia or tachycardia. Plan for D-dimer as unable to apply PERC rule.   Differential includes all life-threatening causes for chest pain. This includes but is not exclusive to acute coronary syndrome, aortic dissection, pulmonary embolism, cardiac tamponade, community-acquired pneumonia, pericarditis, musculoskeletal chest wall pain, etc.  03:30 PM Patient with slightly elevated d-dimer. It continues to be borderline elevated with age-adjusted value. Plan for CT angiogram of the chest to rule out PE especially in the setting of sudden-onset pain. CP is now resolved after nitro and ASA.   06:10 PM  CT angio is negative for PE. Patient remains chest pain free. Discussed plan for admission for further chest pain evaluation.   Discussed patient's case with Hospitalist, Dr. Maryland Pink. Patient and family (if present) updated with plan. Care transferred to  Hospitalist service.  I reviewed all nursing notes, vitals, pertinent old records, EKGs, labs, imaging (as available).  ____________________________________________  FINAL CLINICAL IMPRESSION(S) / ED DIAGNOSES  Final diagnoses:  Precordial chest pain     MEDICATIONS GIVEN DURING THIS VISIT:  Medications  aspirin chewable tablet 324 mg (324 mg Oral Given 01/05/17 1423)  nitroGLYCERIN (NITROSTAT) SL tablet 0.4 mg (0.4 mg Sublingual Given 01/05/17 1422)  sodium chloride 0.9 % bolus 500 mL (500 mLs Intravenous New Bag/Given 01/05/17 1559)  iopamidol (ISOVUE-370) 76 % injection 100 mL (100 mLs Intravenous Contrast Given 01/05/17 1721)     NEW OUTPATIENT MEDICATIONS STARTED DURING THIS VISIT:  None   Note:  This document was prepared using Dragon voice recognition software and may include unintentional dictation errors.  Nanda Quinton, MD Emergency Medicine    Avo Schlachter, [facility], MD 01/05/17 [phone]

## 2017-01-05 NOTE — ED Notes (Signed)
Meal provided 

## 2017-01-05 NOTE — ED Notes (Signed)
Family at bedside Pt reports that she is to be admitted overnight for obsv

## 2017-01-06 ENCOUNTER — Encounter (HOSPITAL_COMMUNITY): Payer: Self-pay

## 2017-01-06 DIAGNOSIS — E039 Hypothyroidism, unspecified: Secondary | ICD-10-CM | POA: Diagnosis not present

## 2017-01-06 DIAGNOSIS — I10 Essential (primary) hypertension: Secondary | ICD-10-CM | POA: Diagnosis not present

## 2017-01-06 DIAGNOSIS — R079 Chest pain, unspecified: Secondary | ICD-10-CM | POA: Diagnosis not present

## 2017-01-06 LAB — COMPREHENSIVE METABOLIC PANEL
ALBUMIN: 3.8 g/dL (ref 3.5–5.0)
ALT: 5 U/L — AB (ref 14–54)
AST: 18 U/L (ref 15–41)
Alkaline Phosphatase: 94 U/L (ref 38–126)
Anion gap: 7 (ref 5–15)
BILIRUBIN TOTAL: 0.8 mg/dL (ref 0.3–1.2)
BUN: 11 mg/dL (ref 6–20)
CHLORIDE: 104 mmol/L (ref 101–111)
CO2: 28 mmol/L (ref 22–32)
Calcium: 9.1 mg/dL (ref 8.9–10.3)
Creatinine, Ser: 0.9 mg/dL (ref 0.44–1.00)
GFR calc Af Amer: >60 mL/min (ref >60)
GFR calc non Af Amer: >60 mL/min (ref >60)
GLUCOSE: 96 mg/dL (ref 65–99)
POTASSIUM: 3.5 mmol/L (ref 3.5–5.1)
Sodium: 139 mmol/L (ref 135–145)
TOTAL PROTEIN: 7 g/dL (ref 6.5–8.1)

## 2017-01-06 LAB — TROPONIN I: Troponin I: <0.03 ng/mL (ref <0.03)

## 2017-01-06 LAB — CBC
HEMATOCRIT: 35.5 % — AB (ref 36.0–46.0)
Hemoglobin: 11.7 g/dL — ABNORMAL LOW (ref 12.0–15.0)
MCH: 28.6 pg (ref 26.0–34.0)
MCHC: 33 g/dL (ref 30.0–36.0)
MCV: 86.8 fL (ref 78.0–100.0)
Platelets: 226 10*3/uL (ref 150–400)
RBC: 4.09 MIL/uL (ref 3.87–5.11)
RDW: 12.4 % (ref 11.5–15.5)
WBC: 3.4 10*3/uL — AB (ref 4.0–10.5)

## 2017-01-06 MED ORDER — NITROGLYCERIN 0.4 MG SL SUBL
0.4000 mg | SUBLINGUAL_TABLET | SUBLINGUAL | Status: DC | PRN
  Administered 2017-01-06: 0.4 mg via SUBLINGUAL
  Filled 2017-01-06: qty 1

## 2017-01-06 MED ORDER — AMLODIPINE BESYLATE 5 MG PO TABS
5.0000 mg | ORAL_TABLET | Freq: Every day | ORAL | Status: DC
  Administered 2017-01-06: 5 mg via ORAL
  Filled 2017-01-06: qty 1

## 2017-01-06 NOTE — Consult Note (Signed)
Primary cardiologist: n/a Consulting cardiologist: Dr Carlyle Dolly MD Requesting physician: Dr Princella Ion  Indication: chest pain  Clinical Summary Lisa Torres is a 71 y.o.female history of HTN, parkinsons disease, breast cancer admitted with chest pain. She reports 2-3 months of intermittent chest pain. Typically a dull pain mid to left chest, can be 10/10. Can occur at rest or with exertion. Can have associated SOB. Lasts about 5-10 minutes. Sometimes can be worst with position. Over the last few days symptoms have been increasing in severity.    K 3.7, Cr 0.82, WBC 3.4, Hgb 12.1, Plt 171, D-dimer 0.70,  Trop neg x 3 CXR no acute process CT PE no PE EKG SR no ischemic changes Allergies  Allergen Reactions  . Hydrocodone   . Tramadol     Hallucinations, abd pain    Medications Scheduled Medications: . anastrozole  1 mg Oral q morning - 10a  . aspirin EC  325 mg Oral Daily  . carbidopa-levodopa  1 tablet Oral TID  . docusate sodium  100 mg Oral Daily  . enoxaparin (LOVENOX) injection  40 mg Subcutaneous Q24H  . levothyroxine  100 mcg Oral QAC breakfast  . pramipexole  1.5 mg Oral TID     Infusions:   PRN Medications:  acetaminophen, gi cocktail, hydrALAZINE, morphine injection, nitroGLYCERIN, ondansetron (ZOFRAN) IV, traZODone   Past Medical History:  Diagnosis Date  . Breast cancer ([place]) 11/2011  . Chronic back pain   . Chronic leg pain    bilateral  . Chronic leukopenia   . DDD (degenerative disc disease), cervical   . DDD (degenerative disc disease), lumbar   . Gait abnormality   . GERD (gastroesophageal reflux disease)   . Hypertension   . Parkinson's disease ([place])   . Radiation 04/2012   33 treatments for breast cancer  . Right knee pain   . Thyroid disease     Past Surgical History:  Procedure Laterality Date  . ABDOMINAL HYSTERECTOMY     partial  . BREAST LUMPECTOMY Left 12/2011  . CHOLECYSTECTOMY    . COLONOSCOPY N/A 07/01/2013   Procedure:  COLONOSCOPY;  Surgeon: Rogene Houston, MD;  Location: [facility];  Service: Endoscopy;  Laterality: N/A;  1030  . TUBAL LIGATION      Family History  Problem Relation Age of Onset  . Cancer Mother 78       colon cancer  . Cancer Father 75       throat cancer  . Pancreatic cancer Brother        [phone] 11-14-15    Social History Ms. Piotrowski reports that she quit smoking about 18 years ago. Her smoking use included Cigarettes. She has a 9.00 pack-year smoking history. She has never used smokeless tobacco. Ms. Frysinger reports that she does not drink alcohol.  Review of Systems CONSTITUTIONAL: No weight loss, fever, chills, weakness or fatigue.  HEENT: Eyes: No visual loss, blurred vision, double vision or yellow sclerae. No hearing loss, sneezing, congestion, runny nose or sore throat.  SKIN: No rash or itching.  CARDIOVASCULAR:per hpi RESPIRATORY: per hpi GASTROINTESTINAL: No anorexia, nausea, vomiting or diarrhea. No abdominal pain or blood.  GENITOURINARY: no polyuria, no dysuria NEUROLOGICAL: No headache, dizziness, syncope, paralysis, ataxia, numbness or tingling in the extremities. No change in bowel or bladder control.  MUSCULOSKELETAL: No muscle, back pain, joint pain or stiffness.  HEMATOLOGIC: No anemia, bleeding or bruising.  LYMPHATICS: No enlarged nodes. No history of splenectomy.  PSYCHIATRIC: No  history of depression or anxiety.      Physical Examination Blood pressure (!) 164/72, pulse 62, temperature 97.6 F (36.4 C), temperature source Oral, resp. rate 19, height 5\' 1"  (1.549 m), weight 164 lb 14.5 oz (74.8 kg), SpO2 100 %. No intake or output data in the 24 hours ending 01/06/17 1424  HEENT: sclera clear, throat clear  Cardiovascular: RRR, no m/r/g, no jvd  Respiratory: CTAB  GI: abdomen soft, NT,ND  MSK: no le edema  Neuro: no focal deficits  Psych: appropriate affect   Lab Results  Basic Metabolic Panel:  Recent Labs Lab 01/05/17 1355  01/06/17 0215  NA 139 139  K 3.7 3.5  CL 104 104  CO2 27 28  GLUCOSE 94 96  BUN 9 11  CREATININE 0.82 0.90  CALCIUM 9.6 9.1    Liver Function Tests:  Recent Labs Lab 01/06/17 0215  AST 18  ALT 5*  ALKPHOS 94  BILITOT 0.8  PROT 7.0  ALBUMIN 3.8    CBC:  Recent Labs Lab 01/05/17 1355 01/06/17 0215  WBC 3.4* 3.4*  HGB 12.1 11.7*  HCT 37.0 35.5*  MCV 86.7 86.8  PLT 171 226    Cardiac Enzymes:  Recent Labs Lab 01/05/17 1355 01/05/17 1836 01/06/17 0215 01/06/17 0611  TROPONINI <0.03 <0.03 <0.03 <0.03    BNP: Invalid input(s): POCBNP     Impression/Recommendations 1. Chest pain - somewhat atypical symptoms. EKG and enzymes thus far negative for ACS. She does have CAD risk factors -we will plan for lexiscan stress test tomorrow  2. HTN - restart home norvasc    Carlyle Dolly, M.D.

## 2017-01-06 NOTE — Care Management Note (Signed)
Case Management Note  Patient Details  Name: Lisa Torres MRN: [id number] Date of Birth: 02-27-1946  Subjective/Objective:                  Admitted with CP. Pt is from home and ind with ADL's. Pt has PCP, transportation and insurance with drug coverage. She communicates no needs.   Action/Plan: Anticipate DC home with self care after CP work up complete.   Expected Discharge Date:       01/06/2017           Expected Discharge Plan:  Home/Self Care  In-House Referral:  NA  Discharge planning Services  CM Consult  Post Acute Care Choice:  NA Choice offered to:  NA  Status of Service:  Completed, signed off  Sherald Barge, RN 01/06/2017, 2:39 PM

## 2017-01-06 NOTE — Progress Notes (Signed)
PROGRESS NOTE    Lisa Torres  DUK:[id number] DOB: February 22, 1946 DOA: 01/05/2017 PCP: [name], MD   Outpatient Specialists:     Brief Narrative:  Lisa Torres is a 71 y.o. female with a past medical history of hypertension, she is a former smoker, history of for Parkinson's disease which was recently diagnosed, history of breast cancer, who was in her usual state of health till this morning when she got up and felt not her usual. She felt tired. She started having some discomfort in the lower part of her chest at the center. Felt like a tightness. Started radiating to the left side of her chest. Denies any fever, no chills. She had some nausea but no vomiting. Did have some shortness of breath. No aggravating or relieving factors otherwise. Her husband came back from the grocery store and the patient mentioned her symptoms to him. By then, her chest tightness was 10/10 in intensity. It was radiating to the neck. No radiation to the arm. She has not had similar symptoms before. She denies any acid reflux. She does get occasional GERD. Does not take any medications for the same. It appears that she was brought in by EMS. She was given aspirin, nitroglycerin, morphine. Pain did get better, to about 4 out of 10 in intensity.   Assessment & Plan:   Principal Problem:   Chest pain Active Problems:   Hypothyroid   Hypertension   Parkinson disease (HCC)   Breast cancer, left breast (HCC)   chest pain: Heart score at least 4 -CT scan negative for PE.  -EKG did not show any obvious ischemic changes yesterday, repeat today while patient c/o chest pain was normal -Ce negative -cardiology has been consulted -patient is tender to chest wall palpation   history of breast cancer: Continue with her home medications.  history of essential hypertension:  -blood pressure was noted to be quite elevated when she was initially evaluated in the emergency department. This could've  contributed as well. Unknown if she is compliant with medications. Continue to monitor.  Finding of a left thyroid nodule: This was incidentally seen on CT scan. It appears that the patient has a history of toxic goiter and has undergone RAI treatment in the past. This can be pursued as an outpatient by her PCP. Continue her levothyroxin.  Recently diagnosed Parkinson's: Followed by Dr. Krista Blue with neurology. Continue her home medications.   DVT prophylaxis:    Code Status: Full Code   Family Communication:   Disposition Plan:     Consultants:   cards   Subjective: Still having CP this AM  Objective: Vitals:   01/06/17 0145 01/06/17 0436 01/06/17 1018 01/06/17 1324  BP: (!) 151/56 (!) 146/63 138/76 (!) 164/72  Pulse:  61 73 62  Resp: 16 16 18 19   Temp: 99 F (37.2 C) 97.9 F (36.6 C) 97.8 F (36.6 C) 97.6 F (36.4 C)  TempSrc: Oral Oral Oral Oral  SpO2: 99% 100% 100% 100%  Weight: 74.8 kg (164 lb 14.5 oz)     Height: 5\' 1"  (1.549 m)      No intake or output data in the 24 hours ending 01/06/17 1354 Filed Weights   01/05/17 1353 01/06/17 0145  Weight: 73.9 kg (163 lb) 74.8 kg (164 lb 14.5 oz)    Examination:  General exam: Appears calm and comfortable  Respiratory system: Clear to auscultation. Respiratory effort normal. Cardiovascular system: S1 & S2 heard, RRR. No JVD, murmurs, rubs, gallops  or clicks. No pedal edema. Gastrointestinal system: Abdomen is nondistended, soft and nontender. No organomegaly or masses felt. Normal bowel sounds heard. Central nervous system: Alert and oriented. No focal neurological deficits. Extremities: Symmetric 5 x 5 power. Skin: No rashes, lesions or ulcers Psychiatry: Judgement and insight appear normal. Mood & affect appropriate.     Data Reviewed: I have personally reviewed following labs and imaging studies  CBC:  Recent Labs Lab 01/05/17 1355 01/06/17 0215  WBC 3.4* 3.4*  HGB 12.1 11.7*  HCT 37.0 35.5*    MCV 86.7 86.8  PLT 171 629   Basic Metabolic Panel:  Recent Labs Lab 01/05/17 1355 01/06/17 0215  NA 139 139  K 3.7 3.5  CL 104 104  CO2 27 28  GLUCOSE 94 96  BUN 9 11  CREATININE 0.82 0.90  CALCIUM 9.6 9.1   GFR: Estimated Creatinine Clearance: 53.8 mL/min (by C-G formula based on SCr of 0.9 mg/dL). Liver Function Tests:  Recent Labs Lab 01/06/17 0215  AST 18  ALT 5*  ALKPHOS 94  BILITOT 0.8  PROT 7.0  ALBUMIN 3.8   No results for input(s): LIPASE, AMYLASE in the last 168 hours. No results for input(s): AMMONIA in the last 168 hours. Coagulation Profile: No results for input(s): INR, PROTIME in the last 168 hours. Cardiac Enzymes:  Recent Labs Lab 01/05/17 1355 01/05/17 1836 01/06/17 0215 01/06/17 0611  TROPONINI <0.03 <0.03 <0.03 <0.03   BNP (last 3 results) No results for input(s): PROBNP in the last 8760 hours. HbA1C: No results for input(s): HGBA1C in the last 72 hours. CBG: No results for input(s): GLUCAP in the last 168 hours. Lipid Profile: No results for input(s): CHOL, HDL, LDLCALC, TRIG, CHOLHDL, LDLDIRECT in the last 72 hours. Thyroid Function Tests: No results for input(s): TSH, T4TOTAL, FREET4, T3FREE, THYROIDAB in the last 72 hours. Anemia Panel: No results for input(s): VITAMINB12, FOLATE, FERRITIN, TIBC, IRON, RETICCTPCT in the last 72 hours. Urine analysis: No results found for: COLORURINE, APPEARANCEUR, LABSPEC, PHURINE, GLUCOSEU, HGBUR, BILIRUBINUR, KETONESUR, PROTEINUR, UROBILINOGEN, NITRITE, LEUKOCYTESUR Sepsis Labs: @LABRCNTIP (procalcitonin:4,lacticidven:4)  )No results found for this or any previous visit (from the past 240 hour(s)).    Anti-infectives    None       Radiology Studies: Ct Angio Chest Pe W And/or Wo Contrast  Result Date: 01/05/2017 CLINICAL DATA:  Chest pain history of breast cancer EXAM: CT ANGIOGRAPHY CHEST WITH CONTRAST TECHNIQUE: Multidetector CT imaging of the chest was performed using the  standard protocol during bolus administration of intravenous contrast. Multiplanar CT image reconstructions and MIPs were obtained to evaluate the vascular anatomy. CONTRAST:  100 mL Isovue 370 intravenous COMPARISON:  Radiograph 01/05/2017, 07/21/2016 FINDINGS: Cardiovascular: Satisfactory opacification of the pulmonary arteries to the segmental level. No evidence of pulmonary embolism. Non aneurysmal aorta. Atherosclerotic calcifications. No dissection is seen. Coronary artery calcification. Heart size within normal limits. No pericardial effusion. Mediastinum/Nodes: Midline trachea. 11 mm hypodense nodule left lobe of thyroid. No significantly enlarged mediastinal or hilar nodes. Esophagus within normal limits. Lungs/Pleura: Mild emphysema. No acute consolidation or pleural effusion. No pneumothorax. Upper Abdomen: No acute abnormality. Musculoskeletal: No chest wall abnormality. No acute or significant osseous findings. Review of the MIP images confirms the above findings. IMPRESSION: 1. Negative for acute pulmonary embolus or aortic dissection 2. Mild emphysema.  No acute infiltrates 3. 11 mm hypodense nodule left lobe of thyroid, may be correlated with nonemergent thyroid ultrasound if clinically appropriate. Aortic Atherosclerosis (ICD10-I70.0) and Emphysema (ICD10-J43.9). Electronically Signed  By: Donavan Foil M.D.   On: 01/05/2017 17:57   Dg Chest Port 1 View  Result Date: 01/05/2017 CLINICAL DATA:  Central chest and epigastric pain. EXAM: PORTABLE CHEST 1 VIEW COMPARISON:  07/10/2016 FINDINGS: 1404 hours. The lungs are clear without focal pneumonia, edema, pneumothorax or pleural effusion. The cardiopericardial silhouette is within normal limits for size. The visualized bony structures of the thorax are intact. Telemetry leads overlie the chest. IMPRESSION: No active disease. Electronically Signed   By: Misty Stanley M.D.   On: 01/05/2017 14:25        Scheduled Meds: . anastrozole  1 mg Oral  q morning - 10a  . aspirin EC  325 mg Oral Daily  . carbidopa-levodopa  1 tablet Oral TID  . docusate sodium  100 mg Oral Daily  . enoxaparin (LOVENOX) injection  40 mg Subcutaneous Q24H  . levothyroxine  100 mcg Oral QAC breakfast  . pramipexole  1.5 mg Oral TID   Continuous Infusions:   LOS: 0 days    Time spent: 25 min    [place], DO [facility] Pager [phone]  If 7PM-7AM, please contact night-coverage [email or web address] Password [email or web address] 01/06/2017, 1:54 PM

## 2017-01-06 NOTE — Care Management Obs Status (Signed)
[facility] NOTIFICATION   Patient Details  Name: Lisa Torres MRN: [id number] Date of Birth: 1946/02/14   Medicare Observation Status Notification Given:  Yes    Sherald Barge, RN 01/06/2017, 11:49 AM

## 2017-01-07 ENCOUNTER — Encounter (HOSPITAL_COMMUNITY): Payer: Self-pay

## 2017-01-07 ENCOUNTER — Observation Stay (HOSPITAL_BASED_OUTPATIENT_CLINIC_OR_DEPARTMENT_OTHER): Payer: Medicare Other

## 2017-01-07 DIAGNOSIS — G20 Parkinson's disease: Secondary | ICD-10-CM | POA: Diagnosis not present

## 2017-01-07 DIAGNOSIS — R079 Chest pain, unspecified: Secondary | ICD-10-CM

## 2017-01-07 DIAGNOSIS — I10 Essential (primary) hypertension: Secondary | ICD-10-CM

## 2017-01-07 DIAGNOSIS — E039 Hypothyroidism, unspecified: Secondary | ICD-10-CM | POA: Diagnosis not present

## 2017-01-07 LAB — NM MYOCAR MULTI W/SPECT W/WALL MOTION / EF
CHL CUP NUCLEAR SDS: 0
CHL CUP NUCLEAR SRS: 0
CHL CUP RESTING HR STRESS: 63 {beats}/min
CSEPPHR: 97 {beats}/min
LHR: 0.27
LV dias vol: 73 mL (ref 46–106)
LVSYSVOL: 37 mL
SSS: 0
TID: 1.25

## 2017-01-07 LAB — MAGNESIUM: Magnesium: 2.1 mg/dL (ref 1.7–2.4)

## 2017-01-07 MED ORDER — POTASSIUM CHLORIDE CRYS ER 20 MEQ PO TBCR
40.0000 meq | EXTENDED_RELEASE_TABLET | Freq: Once | ORAL | Status: AC
  Administered 2017-01-07: 40 meq via ORAL
  Filled 2017-01-07: qty 2

## 2017-01-07 MED ORDER — SODIUM CHLORIDE 0.9% FLUSH
INTRAVENOUS | Status: AC
  Administered 2017-01-07: 10 mL via INTRAVENOUS
  Filled 2017-01-07: qty 10

## 2017-01-07 MED ORDER — AMLODIPINE BESYLATE 5 MG PO TABS
10.0000 mg | ORAL_TABLET | Freq: Every day | ORAL | Status: DC
  Administered 2017-01-07: 10 mg via ORAL
  Filled 2017-01-07: qty 2

## 2017-01-07 MED ORDER — TECHNETIUM TC 99M TETROFOSMIN IV KIT
10.0000 | PACK | Freq: Once | INTRAVENOUS | Status: AC | PRN
  Administered 2017-01-07: 9.5 via INTRAVENOUS

## 2017-01-07 MED ORDER — POTASSIUM CHLORIDE CRYS ER 20 MEQ PO TBCR: 20.0000 meq | EXTENDED_RELEASE_TABLET | Freq: Every day | ORAL | Status: DC

## 2017-01-07 MED ORDER — TECHNETIUM TC 99M TETROFOSMIN IV KIT
30.0000 | PACK | Freq: Once | INTRAVENOUS | Status: AC | PRN
  Administered 2017-01-07: 32 via INTRAVENOUS

## 2017-01-07 MED ORDER — REGADENOSON 0.4 MG/5ML IV SOLN
INTRAVENOUS | Status: AC
  Administered 2017-01-07: 0.4 mg via INTRAVENOUS
  Filled 2017-01-07: qty 5

## 2017-01-07 NOTE — Discharge Summary (Signed)
Physician Discharge Summary  RYVER ZADROZNY EUM:[id number] DOB: 1946/03/10 DOA: 01/05/2017  PCP: [name], MD  Admit date: 01/05/2017 Discharge date: 01/07/2017  Admitted From: Home  Disposition:  Home   Recommendations for Outpatient Follow-up:  1. Follow up with PCP in 1-week 2. Pharmacological nuclear stress test was low risk. 3. Follow-up on 11 mm hypodense nodule in the left lobe of thyroid.   Home Health: No  Equipment/Devices: No   Discharge Condition: Stable  CODE STATUS: Full Diet recommendation: Heart Healthy   Brief/Interim Summary: 71 year old female who presented with chest pain. Patient is known to have hypertension, history of tobacco abuse, Parkinson's disease and breast cancer. Develop acute chest discomfort not exertional related, radiated into her left chest and neck, 10 out of 10 in intensity with no associated symptoms. On the physical examination blood pressure 159/69, heart rate 65, respiratory rate 18, oxygen saturation 99%. Moist mucous membranes, lungs were clear to auscultation bilaterally, heart S1-S2 present rhythmic, abdomen was soft nontender, no lower extremity edema. Sodium 139, potassium 3.7, chloride 104, bicarbonate 27, glucose 94, BUN 9, creatinine 0.82, white count 3.4, hemoglobin 12.1, hematocrit 37.0, platelets are 171, d-dimer 0.70, CT chest negative for pulmonary embolism or aortic dissection. Mild emphysema, 11 mm hypodense nodule on the left lobe of the thyroid. Chest x-ray negative for infiltrates. EKG was normal sinus rhythm.  Patient was admitted to the hospital with the working diagnosis of atypical chest pain.  1. Atypical chest pain. Patient had serial cardiac enzymes, EKG negative for ischemia. Patient was seen by cardiology, she underwent a pharmacological nuclear stress test which was low risk.  2. Hypertension. Patient will resume amlodipine 5 mg daily.   3. History of Breast cancer. We'll continue anastrozole   4.  Hypothyroidism. Continue levothyroxine 100 g daily, she does have an incidental thyroid nodule that needs follow-up as an outpatient.  5. Depression. Continue trazodone.  6. Parkinsonism. Continue Sinemet, mirapex and melatonin.  Discharge Diagnoses:  Principal Problem:   Chest pain Active Problems:   Hypothyroid   Hypertension   Parkinson disease ([place])   Breast cancer, left breast ([place])    Discharge Instructions   Allergies as of 01/07/2017      Reactions   Hydrocodone    Tramadol    Hallucinations, abd pain      Medication List    TAKE these medications   acetaminophen 500 MG tablet Commonly known as:  TYLENOL Take 500 mg by mouth 3 (three) times daily as needed.   amLODipine 5 MG tablet Commonly known as:  NORVASC Take 5 mg by mouth every morning.   anastrozole 1 MG tablet Commonly known as:  ARIMIDEX TAKE 1 TABLET EVERY MORNING   calcium carbonate 600 MG Tabs tablet Commonly known as:  OS-CAL Take 600 mg by mouth 2 (two) times daily with a meal.   carbidopa-levodopa 25-100 MG tablet Commonly known as:  SINEMET IR Take 1 tablet by mouth 3 (three) times daily.   docusate sodium 100 MG capsule Commonly known as:  COLACE Take 100 mg by mouth daily.   levothyroxine 100 MCG tablet Commonly known as:  SYNTHROID, LEVOTHROID Take 100 mcg by mouth daily before breakfast.   Melatonin 3 MG Caps Take 3 mg by mouth at bedtime.   meloxicam 15 MG tablet Commonly known as:  MOBIC Take 15 mg by mouth daily.   pramipexole 1 MG tablet Commonly known as:  MIRAPEX Take 1.5 tablets (1.5 mg total) by mouth 3 (three) times  daily.   TART CHERRY ADVANCED PO Take 1 tablet by mouth daily.   traZODone 50 MG tablet Commonly known as:  DESYREL TAKE 1 TABLET BY MOUT AT BEDTIME AS NEEDED FOR SLEEP   triamcinolone cream 0.1 % Commonly known as:  KENALOG APPLY TO AFFECTED AREA UP TO TWICE DAILY AS NEEDED (NOT TO FACE, GROIN, OR UNDERARMS)   Turmeric Curcumin 500 MG  Caps Take 1 capsule by mouth daily.   Vitamin D-3 1000 units Caps Take 1,000 Units by mouth 2 (two) times daily.      Follow-up Information    Lendon Colonel, NP Follow up on 01/21/2017.   Specialties:  Nurse Practitioner, Radiology, Cardiology Why:  3 pm  Contact information: [place] [address] [phone]          Allergies  Allergen Reactions  . Hydrocodone   . Tramadol     Hallucinations, abd pain    Consultations:  Cardiology    Procedures/Studies: Ct Angio Chest Pe W And/or Wo Contrast  Result Date: 01/05/2017 CLINICAL DATA:  Chest pain history of breast cancer EXAM: CT ANGIOGRAPHY CHEST WITH CONTRAST TECHNIQUE: Multidetector CT imaging of the chest was performed using the standard protocol during bolus administration of intravenous contrast. Multiplanar CT image reconstructions and MIPs were obtained to evaluate the vascular anatomy. CONTRAST:  100 mL Isovue 370 intravenous COMPARISON:  Radiograph 01/05/2017, 07/21/2016 FINDINGS: Cardiovascular: Satisfactory opacification of the pulmonary arteries to the segmental level. No evidence of pulmonary embolism. Non aneurysmal aorta. Atherosclerotic calcifications. No dissection is seen. Coronary artery calcification. Heart size within normal limits. No pericardial effusion. Mediastinum/Nodes: Midline trachea. 11 mm hypodense nodule left lobe of thyroid. No significantly enlarged mediastinal or hilar nodes. Esophagus within normal limits. Lungs/Pleura: Mild emphysema. No acute consolidation or pleural effusion. No pneumothorax. Upper Abdomen: No acute abnormality. Musculoskeletal: No chest wall abnormality. No acute or significant osseous findings. Review of the MIP images confirms the above findings. IMPRESSION: 1. Negative for acute pulmonary embolus or aortic dissection 2. Mild emphysema.  No acute infiltrates 3. 11 mm hypodense nodule left lobe of thyroid, may be correlated with nonemergent thyroid  ultrasound if clinically appropriate. Aortic Atherosclerosis (ICD10-I70.0) and Emphysema (ICD10-J43.9). Electronically Signed   By: Donavan Foil M.D.   On: 01/05/2017 17:57   Nm Myocar Multi W/spect W/wall Motion / Ef  Result Date: 01/07/2017  There was no ST segment deviation noted during stress.  The study is normal. There are no perfusion defects consistent with prior infarct or current ischemia  This is a low risk study.  The left ventricular ejection fraction is low normal 50%.    Dg Chest Port 1 View  Result Date: 01/05/2017 CLINICAL DATA:  Central chest and epigastric pain. EXAM: PORTABLE CHEST 1 VIEW COMPARISON:  07/10/2016 FINDINGS: 1404 hours. The lungs are clear without focal pneumonia, edema, pneumothorax or pleural effusion. The cardiopericardial silhouette is within normal limits for size. The visualized bony structures of the thorax are intact. Telemetry leads overlie the chest. IMPRESSION: No active disease. Electronically Signed   By: Misty Stanley M.D.   On: 01/05/2017 14:25       Subjective: Patient with no further chest pain, no nausea or vomiting.   Discharge Exam: Vitals:   01/07/17 0111 01/07/17 0511  BP: (!) 142/66 (!) 151/56  Pulse: 65 80  Resp: 20 18  Temp: 98.6 F (37 C) 99 F (37.2 C)   Vitals:   01/06/17 1717 01/06/17 2117 01/07/17 0111 01/07/17 3086  BP: (!) 144/58 (!) 167/75 (!) 142/66 (!) 151/56  Pulse: 70 (!) 135 65 80  Resp: 19 19 20 18   Temp: 97.9 F (36.6 C) 97.9 F (36.6 C) 98.6 F (37 C) 99 F (37.2 C)  TempSrc: Oral Oral Oral Oral  SpO2: 100% 100% 100% 99%  Weight:    74.8 kg (164 lb 14.5 oz)  Height:    5' 1.5" (1.562 m)    General: Pt is alert, awake, not in acute distress E ENT. No pallor or icterus Cardiovascular: RRR, S1/S2 +, no rubs, no gallops Respiratory: CTA bilaterally, no wheezing, no rhonchi Abdominal: Soft, NT, ND, bowel sounds + Extremities: no edema, no cyanosis    The results of significant diagnostics  from this hospitalization (including imaging, microbiology, ancillary and laboratory) are listed below for reference.     Microbiology: No results found for this or any previous visit (from the past 240 hour(s)).   Labs: BNP (last 3 results) No results for input(s): BNP in the last 8760 hours. Basic Metabolic Panel:  Recent Labs Lab 01/05/17 1355 01/06/17 0215 01/07/17 1015  NA 139 139  --   K 3.7 3.5  --   CL 104 104  --   CO2 27 28  --   GLUCOSE 94 96  --   BUN 9 11  --   CREATININE 0.82 0.90  --   CALCIUM 9.6 9.1  --   MG  --   --  2.1   Liver Function Tests:  Recent Labs Lab 01/06/17 0215  AST 18  ALT 5*  ALKPHOS 94  BILITOT 0.8  PROT 7.0  ALBUMIN 3.8   No results for input(s): LIPASE, AMYLASE in the last 168 hours. No results for input(s): AMMONIA in the last 168 hours. CBC:  Recent Labs Lab 01/05/17 1355 01/06/17 0215  WBC 3.4* 3.4*  HGB 12.1 11.7*  HCT 37.0 35.5*  MCV 86.7 86.8  PLT 171 226   Cardiac Enzymes:  Recent Labs Lab 01/05/17 1355 01/05/17 1836 01/06/17 0215 01/06/17 0611  TROPONINI <0.03 <0.03 <0.03 <0.03   BNP: Invalid input(s): POCBNP CBG: No results for input(s): GLUCAP in the last 168 hours. D-Dimer  Recent Labs  01/05/17 1355  DDIMER 0.70*   Hgb A1c No results for input(s): HGBA1C in the last 72 hours. Lipid Profile No results for input(s): CHOL, HDL, LDLCALC, TRIG, CHOLHDL, LDLDIRECT in the last 72 hours. Thyroid function studies No results for input(s): TSH, T4TOTAL, T3FREE, THYROIDAB in the last 72 hours.  Invalid input(s): FREET3 Anemia work up No results for input(s): VITAMINB12, FOLATE, FERRITIN, TIBC, IRON, RETICCTPCT in the last 72 hours. Urinalysis No results found for: COLORURINE, APPEARANCEUR, LABSPEC, [place], GLUCOSEU, HGBUR, BILIRUBINUR, KETONESUR, PROTEINUR, UROBILINOGEN, NITRITE, LEUKOCYTESUR Sepsis Labs Invalid input(s): PROCALCITONIN,  WBC,  LACTICIDVEN Microbiology No results found for  this or any previous visit (from the past 240 hour(s)).   Time coordinating discharge: 45 minutes  SIGNED:   Tawni Millers, MD  [facility] 01/07/2017, 12:18 PM Pager   If 7PM-7AM, please contact night-coverage [email or web address] Password [email or web address]

## 2017-01-07 NOTE — Progress Notes (Signed)
Progress Note  Patient Name: Lisa Torres Date of Encounter: 01/07/2017  Primary Cardiologist: Harl Bowie MD  Subjective   Patient assessed in stress lab. Complained of chest tightness overnight.   Inpatient Medications    Scheduled Meds: . amLODipine  5 mg Oral Daily  . anastrozole  1 mg Oral q morning - 10a  . aspirin EC  325 mg Oral Daily  . carbidopa-levodopa  1 tablet Oral TID  . docusate sodium  100 mg Oral Daily  . enoxaparin (LOVENOX) injection  40 mg Subcutaneous Q24H  . levothyroxine  100 mcg Oral QAC breakfast  . potassium chloride  40 mEq Oral Once   Followed by  . [START ON 01/08/2017] potassium chloride  20 mEq Oral Daily  . pramipexole  1.5 mg Oral TID   Continuous Infusions:  PRN Meds: acetaminophen, gi cocktail, hydrALAZINE, morphine injection, nitroGLYCERIN, ondansetron (ZOFRAN) IV, technetium tetrofosmin, traZODone   Vital Signs    Vitals:   01/06/17 1717 01/06/17 2117 01/07/17 0111 01/07/17 0511  BP: (!) 144/58 (!) 167/75 (!) 142/66 (!) 151/56  Pulse: 70 (!) 135 65 80  Resp: 19 19 20 18   Temp: 97.9 F (36.6 C) 97.9 F (36.6 C) 98.6 F (37 C) 99 F (37.2 C)  TempSrc: Oral Oral Oral Oral  SpO2: 100% 100% 100% 99%  Weight:    164 lb 14.5 oz (74.8 kg)  Height:    5' 1.5" (1.562 m)    Intake/Output Summary (Last 24 hours) at 01/07/17 0741 Last data filed at 01/06/17 1718  Gross per 24 hour  Intake              480 ml  Output              400 ml  Net               80 ml   Filed Weights   01/05/17 1353 01/06/17 0145 01/07/17 0511  Weight: 163 lb (73.9 kg) 164 lb 14.5 oz (74.8 kg) 164 lb 14.5 oz (74.8 kg)    Telemetry    NSR with NSVT around 2 am this am.  - Personally Reviewed  ECG     -NSR, with T-wave abnormalities in the lateral leads.  Personally Reviewed  Physical Exam   GEN: No acute distress.   Neck: No JVD Cardiac: RRR, RRR,murmurs, rubs, or gallops.  Respiratory: Clear to auscultation bilaterally. GI: Soft,  nontender, non-distended  MS: No edema; No deformity. Neuro:  Nonfocal  Psych: Normal affect   Labs    Chemistry Recent Labs Lab 01/05/17 1355 01/06/17 0215  NA 139 139  K 3.7 3.5  CL 104 104  CO2 27 28  GLUCOSE 94 96  BUN 9 11  CREATININE 0.82 0.90  CALCIUM 9.6 9.1  PROT  --  7.0  ALBUMIN  --  3.8  AST  --  18  ALT  --  5*  ALKPHOS  --  94  BILITOT  --  0.8  GFRNONAA >60 >60  GFRAA >60 >60  ANIONGAP 8 7     Hematology Recent Labs Lab 01/05/17 1355 01/06/17 0215  WBC 3.4* 3.4*  RBC 4.27 4.09  HGB 12.1 11.7*  HCT 37.0 35.5*  MCV 86.7 86.8  MCH 28.3 28.6  MCHC 32.7 33.0  RDW 12.5 12.4  PLT 171 226    Cardiac Enzymes Recent Labs Lab 01/05/17 1355 01/05/17 1836 01/06/17 0215 01/06/17 0611  TROPONINI <0.03 <0.03 <0.03 <0.03    Recent  Labs Lab 01/05/17 1405  TROPIPOC 0.00     DDimer  Recent Labs Lab 01/05/17 1355  DDIMER 0.70*     Radiology    Ct Angio Chest Pe W And/or Wo Contrast  Result Date: 01/05/2017 CLINICAL DATA:  Chest pain history of breast cancer EXAM: CT ANGIOGRAPHY CHEST WITH CONTRAST TECHNIQUE: Multidetector CT imaging of the chest was performed using the standard protocol during bolus administration of intravenous contrast. Multiplanar CT image reconstructions and MIPs were obtained to evaluate the vascular anatomy. CONTRAST:  100 mL Isovue 370 intravenous COMPARISON:  Radiograph 01/05/2017, 07/21/2016 FINDINGS: Cardiovascular: Satisfactory opacification of the pulmonary arteries to the segmental level. No evidence of pulmonary embolism. Non aneurysmal aorta. Atherosclerotic calcifications. No dissection is seen. Coronary artery calcification. Heart size within normal limits. No pericardial effusion. Mediastinum/Nodes: Midline trachea. 11 mm hypodense nodule left lobe of thyroid. No significantly enlarged mediastinal or hilar nodes. Esophagus within normal limits. Lungs/Pleura: Mild emphysema. No acute consolidation or pleural  effusion. No pneumothorax. Upper Abdomen: No acute abnormality. Musculoskeletal: No chest wall abnormality. No acute or significant osseous findings. Review of the MIP images confirms the above findings. IMPRESSION: 1. Negative for acute pulmonary embolus or aortic dissection 2. Mild emphysema.  No acute infiltrates 3. 11 mm hypodense nodule left lobe of thyroid, may be correlated with nonemergent thyroid ultrasound if clinically appropriate. Aortic Atherosclerosis (ICD10-I70.0) and Emphysema (ICD10-J43.9). Electronically Signed   By: Donavan Foil M.D.   On: 01/05/2017 17:57   Dg Chest Port 1 View  Result Date: 01/05/2017 CLINICAL DATA:  Central chest and epigastric pain. EXAM: PORTABLE CHEST 1 VIEW COMPARISON:  07/10/2016 FINDINGS: 1404 hours. The lungs are clear without focal pneumonia, edema, pneumothorax or pleural effusion. The cardiopericardial silhouette is within normal limits for size. The visualized bony structures of the thorax are intact. Telemetry leads overlie the chest. IMPRESSION: No active disease. Electronically Signed   By: Misty Stanley M.D.   On: 01/05/2017 14:25    Cardiac Studies   None  Patient Profile     71 y.o. female admitted with chest pain, with hx of hypertension and Parkinson's disease.   Assessment & Plan    1. Chest Pain: Had recurrent chest tightness overnight. Lexiscan this am. She has PSVT overnight, symptomatic with chest tightness. Low normal potassium this am. Will replete and check magnesium. No ectopy seen during stress portion of Lexiscan. Nuclear scintigraphy to follow.   2. Hypertension:  Remains elevated. On amlodipine with hydralazine prn. Increase amlodipine for optimal BP control. Echo ordered today.   3. PSVT: Not on BB currently. Replaced potassium for more therapeutic level to 4.0. Can consider adding BB if recurrence of arrhythmia.   4. Parkinson's Disease: Per PCP.    Signed, Phill Myron. West Pugh, ANP, AACC   01/07/2017, 7:41 AM     Attending note Patient seen and discussed with DNP Purcell Nails, I agree with her documentation above. Admitted with somewhat atypical chest pain that was somewhat positional and reproducible to palpation, EKGs and enzymes negative for ACS. Lexiscan MPI is negative for ischemia. No further cardiac testing is planned at this time. Episode of PSVT with aberrancy yesterday in setting of low K, replace today. If symptomatic in the future can consider beta blocker. [place] for discharge from cardiac standpoint. We will arrange f/u in 2 weeks in cardiology clinic.   Carlyle Dolly MD

## 2017-01-15 HISTORY — PX: JOINT REPLACEMENT: SHX530

## 2017-01-20 NOTE — Progress Notes (Signed)
Cardiology Office Note   Date:  01/21/2017   ID:  Lisa, Torres [birth year], MRN [id number]  PCP:  [name], MD  Cardiologist:  Branch  Chief Complaint  Patient presents with  . Chest Pain  . Tachycardia      History of Present Illness: Lisa Torres is a 71 y.o. female who presents for post-hospitalization follow-up. She was admitted with recurrent chest pain, felt to be atypical. The patient's pain was reproducible with position and palpation. She did have one episode of PSVT and abberancy, This was in the setting of low potassium.   Study Result 01/07/2017.    There was no ST segment deviation noted during stress.  The study is normal. There are no perfusion defects consistent with prior infarct or current ischemia  This is a low risk study.  The left ventricular ejection fraction is low normal 50%.   She comes today without further complaint of chest pain, or rapid HR. She is still not very ambulatory. She states she is due to have left knee replacement at [facility] by Dr. Case on August 21,2018.   Past Medical History:  Diagnosis Date  . Breast cancer ([place]) 11/2011  . Chronic back pain   . Chronic leg pain    bilateral  . Chronic leukopenia   . DDD (degenerative disc disease), cervical   . DDD (degenerative disc disease), lumbar   . Gait abnormality   . GERD (gastroesophageal reflux disease)   . Hypertension   . Parkinson's disease ([place])   . Radiation 04/2012   33 treatments for breast cancer  . Right knee pain   . Thyroid disease     Past Surgical History:  Procedure Laterality Date  . ABDOMINAL HYSTERECTOMY     partial  . BREAST LUMPECTOMY Left 12/2011  . CHOLECYSTECTOMY    . COLONOSCOPY N/A 07/01/2013   Procedure: COLONOSCOPY;  Surgeon: Rogene Houston, MD;  Location: [facility];  Service: Endoscopy;  Laterality: N/A;  1030  . TUBAL LIGATION       Current Outpatient Prescriptions  Medication Sig Dispense Refill  .  acetaminophen (TYLENOL) 500 MG tablet Take 500 mg by mouth 3 (three) times daily as needed.    Marland Kitchen amLODipine (NORVASC) 5 MG tablet Take 5 mg by mouth every morning.     Marland Kitchen anastrozole (ARIMIDEX) 1 MG tablet TAKE 1 TABLET EVERY MORNING 90 tablet 3  . calcium carbonate (OS-CAL) 600 MG TABS tablet Take 600 mg by mouth 2 (two) times daily with a meal.    . carbidopa-levodopa (SINEMET IR) 25-100 MG tablet Take 1 tablet by mouth 3 (three) times daily. 90 tablet 11  . Cholecalciferol (VITAMIN D-3) 1000 UNITS CAPS Take 1,000 Units by mouth 2 (two) times daily.    Marland Kitchen docusate sodium (COLACE) 100 MG capsule Take 100 mg by mouth daily.    Marland Kitchen levothyroxine (SYNTHROID, LEVOTHROID) 100 MCG tablet Take 100 mcg by mouth daily before breakfast.    . Melatonin 3 MG CAPS Take 3 mg by mouth at bedtime.    . meloxicam (MOBIC) 15 MG tablet Take 15 mg by mouth daily.    . Misc Natural Products (TART CHERRY ADVANCED PO) Take 1 tablet by mouth daily.    . pramipexole (MIRAPEX) 1 MG tablet Take 1.5 tablets (1.5 mg total) by mouth 3 (three) times daily. 405 tablet 1  . traZODone (DESYREL) 50 MG tablet TAKE 1 TABLET BY MOUT AT BEDTIME AS NEEDED FOR SLEEP  6  . triamcinolone cream (KENALOG) 0.1 % APPLY TO AFFECTED AREA UP TO TWICE DAILY AS NEEDED (NOT TO FACE, GROIN, OR UNDERARMS)  3  . Turmeric Curcumin 500 MG CAPS Take 1 capsule by mouth daily.     No current facility-administered medications for this visit.     Allergies:   Hydrocodone and Tramadol    Social History:  The patient  reports that she quit smoking about 18 years ago. Her smoking use included Cigarettes. She has a 9.00 pack-year smoking history. She has never used smokeless tobacco. She reports that she does not drink alcohol or use drugs.   Family History:  The patient's family history includes Cancer (age of onset: 70) in her mother; Cancer (age of onset: 59) in her father; Pancreatic cancer in her brother.    ROS: All other systems are reviewed and  negative. Unless otherwise mentioned in H&P    PHYSICAL EXAM: VS:  BP 138/84   Pulse 72   Ht 5' 1.5" (1.562 m)   Wt 165 lb (74.8 kg)   SpO2 97%   BMI 30.67 kg/m  , BMI Body mass index is 30.67 kg/m. GEN: Well nourished, well developed, in no acute distress  HEENT: normal  Neck: no JVD, carotid bruits, or masses Cardiac: RRR; no murmurs, rubs, or gallops,no edema  Respiratory:  clear to auscultation bilaterally, normal work of breathing GI: soft, nontender, nondistended, + BS MS: no deformity or atrophy  Skin: warm and dry, no rash Neuro:  Strength and sensation are intact Psych: euthymic mood, full affect    Recent Labs: 06/22/2016: TSH 26.648 01/06/2017: ALT 5; BUN 11; Creatinine, Ser 0.90; Hemoglobin 11.7; Platelets 226; Potassium 3.5; Sodium 139 01/07/2017: Magnesium 2.1    Lipid Panel No results found for: CHOL, TRIG, HDL, CHOLHDL, VLDL, LDLCALC, LDLDIRECT    Wt Readings from Last 3 Encounters:  01/21/17 165 lb (74.8 kg)  01/07/17 164 lb 14.5 oz (74.8 kg)  12/03/16 166 lb 12.8 oz (75.7 kg)     ASSESSMENT AND PLAN:  1.  Chest Pain: Felt to be related to hypokalemia and rapid HR. Stress test was reassuring. Will not plan any further cardiac testing at this time. Continue current regimen.   2. Hypertension: BP is well controlled. She should continue medications peri-operatively.   3. PSVT No further episodes since discharge. No new medications will be added to her regimen.   4. Left Knee pain: Due to TKR on February 04, 2017. She is low risk from cardiac standpoint to undergo surgery. Negative stress test as above.    Current medicines are reviewed at length with the patient today.    Labs/ tests ordered today include:  Phill Myron. West Pugh, ANP, AACC   01/21/2017 3:40 PM    [facility] [address] [place], [place] [address] Phone: [phone]; Fax: [phone]

## 2017-01-21 ENCOUNTER — Encounter: Payer: Self-pay | Admitting: Adult Health

## 2017-01-21 ENCOUNTER — Ambulatory Visit (INDEPENDENT_AMBULATORY_CARE_PROVIDER_SITE_OTHER): Payer: Medicare Other | Admitting: Adult Health

## 2017-01-21 VITALS — BP 138/84 | HR 72 | Ht 61.5 in | Wt 165.0 lb

## 2017-01-21 DIAGNOSIS — I471 Supraventricular tachycardia, unspecified: Secondary | ICD-10-CM

## 2017-01-21 DIAGNOSIS — I10 Essential (primary) hypertension: Secondary | ICD-10-CM

## 2017-01-21 NOTE — Patient Instructions (Signed)

## 2017-02-06 ENCOUNTER — Other Ambulatory Visit: Payer: Self-pay | Admitting: Adult Health

## 2017-02-06 ENCOUNTER — Other Ambulatory Visit (HOSPITAL_COMMUNITY): Payer: Self-pay | Admitting: Hematology & Oncology

## 2017-02-06 DIAGNOSIS — Z9889 Other specified postprocedural states: Secondary | ICD-10-CM

## 2017-02-07 DIAGNOSIS — Z471 Aftercare following joint replacement surgery: Secondary | ICD-10-CM | POA: Diagnosis not present

## 2017-02-07 DIAGNOSIS — E039 Hypothyroidism, unspecified: Secondary | ICD-10-CM | POA: Diagnosis not present

## 2017-02-07 DIAGNOSIS — M1990 Unspecified osteoarthritis, unspecified site: Secondary | ICD-10-CM | POA: Diagnosis not present

## 2017-02-07 DIAGNOSIS — G259 Extrapyramidal and movement disorder, unspecified: Secondary | ICD-10-CM | POA: Diagnosis not present

## 2017-02-07 DIAGNOSIS — Z7401 Bed confinement status: Secondary | ICD-10-CM | POA: Diagnosis not present

## 2017-02-07 DIAGNOSIS — K5900 Constipation, unspecified: Secondary | ICD-10-CM | POA: Diagnosis not present

## 2017-02-07 DIAGNOSIS — I10 Essential (primary) hypertension: Secondary | ICD-10-CM | POA: Diagnosis not present

## 2017-02-07 DIAGNOSIS — D0590 Unspecified type of carcinoma in situ of unspecified breast: Secondary | ICD-10-CM | POA: Diagnosis not present

## 2017-02-07 DIAGNOSIS — Z96651 Presence of right artificial knee joint: Secondary | ICD-10-CM | POA: Diagnosis not present

## 2017-02-07 DIAGNOSIS — R278 Other lack of coordination: Secondary | ICD-10-CM | POA: Diagnosis not present

## 2017-02-07 DIAGNOSIS — R2689 Other abnormalities of gait and mobility: Secondary | ICD-10-CM | POA: Diagnosis not present

## 2017-02-07 DIAGNOSIS — R279 Unspecified lack of coordination: Secondary | ICD-10-CM | POA: Diagnosis not present

## 2017-02-07 DIAGNOSIS — G20 Parkinson's disease: Secondary | ICD-10-CM | POA: Diagnosis not present

## 2017-02-07 DIAGNOSIS — G4700 Insomnia, unspecified: Secondary | ICD-10-CM | POA: Diagnosis not present

## 2017-02-07 DIAGNOSIS — M6281 Muscle weakness (generalized): Secondary | ICD-10-CM | POA: Diagnosis not present

## 2017-02-11 ENCOUNTER — Encounter (HOSPITAL_COMMUNITY): Payer: Medicare Other

## 2017-03-28 DIAGNOSIS — T8453XD Infection and inflammatory reaction due to internal right knee prosthesis, subsequent encounter: Secondary | ICD-10-CM | POA: Diagnosis not present

## 2017-03-28 DIAGNOSIS — E039 Hypothyroidism, unspecified: Secondary | ICD-10-CM | POA: Diagnosis not present

## 2017-03-28 DIAGNOSIS — R278 Other lack of coordination: Secondary | ICD-10-CM | POA: Diagnosis not present

## 2017-03-28 DIAGNOSIS — G4700 Insomnia, unspecified: Secondary | ICD-10-CM | POA: Diagnosis not present

## 2017-03-28 DIAGNOSIS — K5900 Constipation, unspecified: Secondary | ICD-10-CM | POA: Diagnosis not present

## 2017-03-28 DIAGNOSIS — Z4789 Encounter for other orthopedic aftercare: Secondary | ICD-10-CM | POA: Diagnosis not present

## 2017-03-28 DIAGNOSIS — G259 Extrapyramidal and movement disorder, unspecified: Secondary | ICD-10-CM | POA: Diagnosis not present

## 2017-03-28 DIAGNOSIS — R2689 Other abnormalities of gait and mobility: Secondary | ICD-10-CM | POA: Diagnosis not present

## 2017-03-28 DIAGNOSIS — M6281 Muscle weakness (generalized): Secondary | ICD-10-CM | POA: Diagnosis not present

## 2017-03-28 DIAGNOSIS — Z471 Aftercare following joint replacement surgery: Secondary | ICD-10-CM | POA: Diagnosis not present

## 2017-03-28 DIAGNOSIS — Z886 Allergy status to analgesic agent status: Secondary | ICD-10-CM | POA: Diagnosis not present

## 2017-03-28 DIAGNOSIS — M1990 Unspecified osteoarthritis, unspecified site: Secondary | ICD-10-CM | POA: Diagnosis not present

## 2017-03-28 DIAGNOSIS — G20 Parkinson's disease: Secondary | ICD-10-CM | POA: Diagnosis not present

## 2017-03-28 DIAGNOSIS — I10 Essential (primary) hypertension: Secondary | ICD-10-CM | POA: Diagnosis not present

## 2017-03-28 DIAGNOSIS — D0590 Unspecified type of carcinoma in situ of unspecified breast: Secondary | ICD-10-CM | POA: Diagnosis not present

## 2017-03-28 DIAGNOSIS — Z96651 Presence of right artificial knee joint: Secondary | ICD-10-CM | POA: Diagnosis not present

## 2017-05-06 ENCOUNTER — Ambulatory Visit (HOSPITAL_COMMUNITY): Payer: Medicare Other

## 2017-05-06 ENCOUNTER — Other Ambulatory Visit (HOSPITAL_COMMUNITY): Payer: Medicare Other

## 2017-05-29 ENCOUNTER — Ambulatory Visit (HOSPITAL_COMMUNITY): Payer: Medicare Other

## 2017-05-29 ENCOUNTER — Other Ambulatory Visit (HOSPITAL_COMMUNITY): Payer: Medicare Other

## 2017-06-04 ENCOUNTER — Ambulatory Visit: Payer: Medicare Other | Admitting: Nurse Practitioner

## 2017-07-22 IMAGING — CR DG ABDOMEN 1V
1 series · 1 of 1 positions shown · non-contrast
Comparison: None.

CLINICAL DATA: Constipation.

EXAM:
ABDOMEN - 1 VIEW

[supine ap]
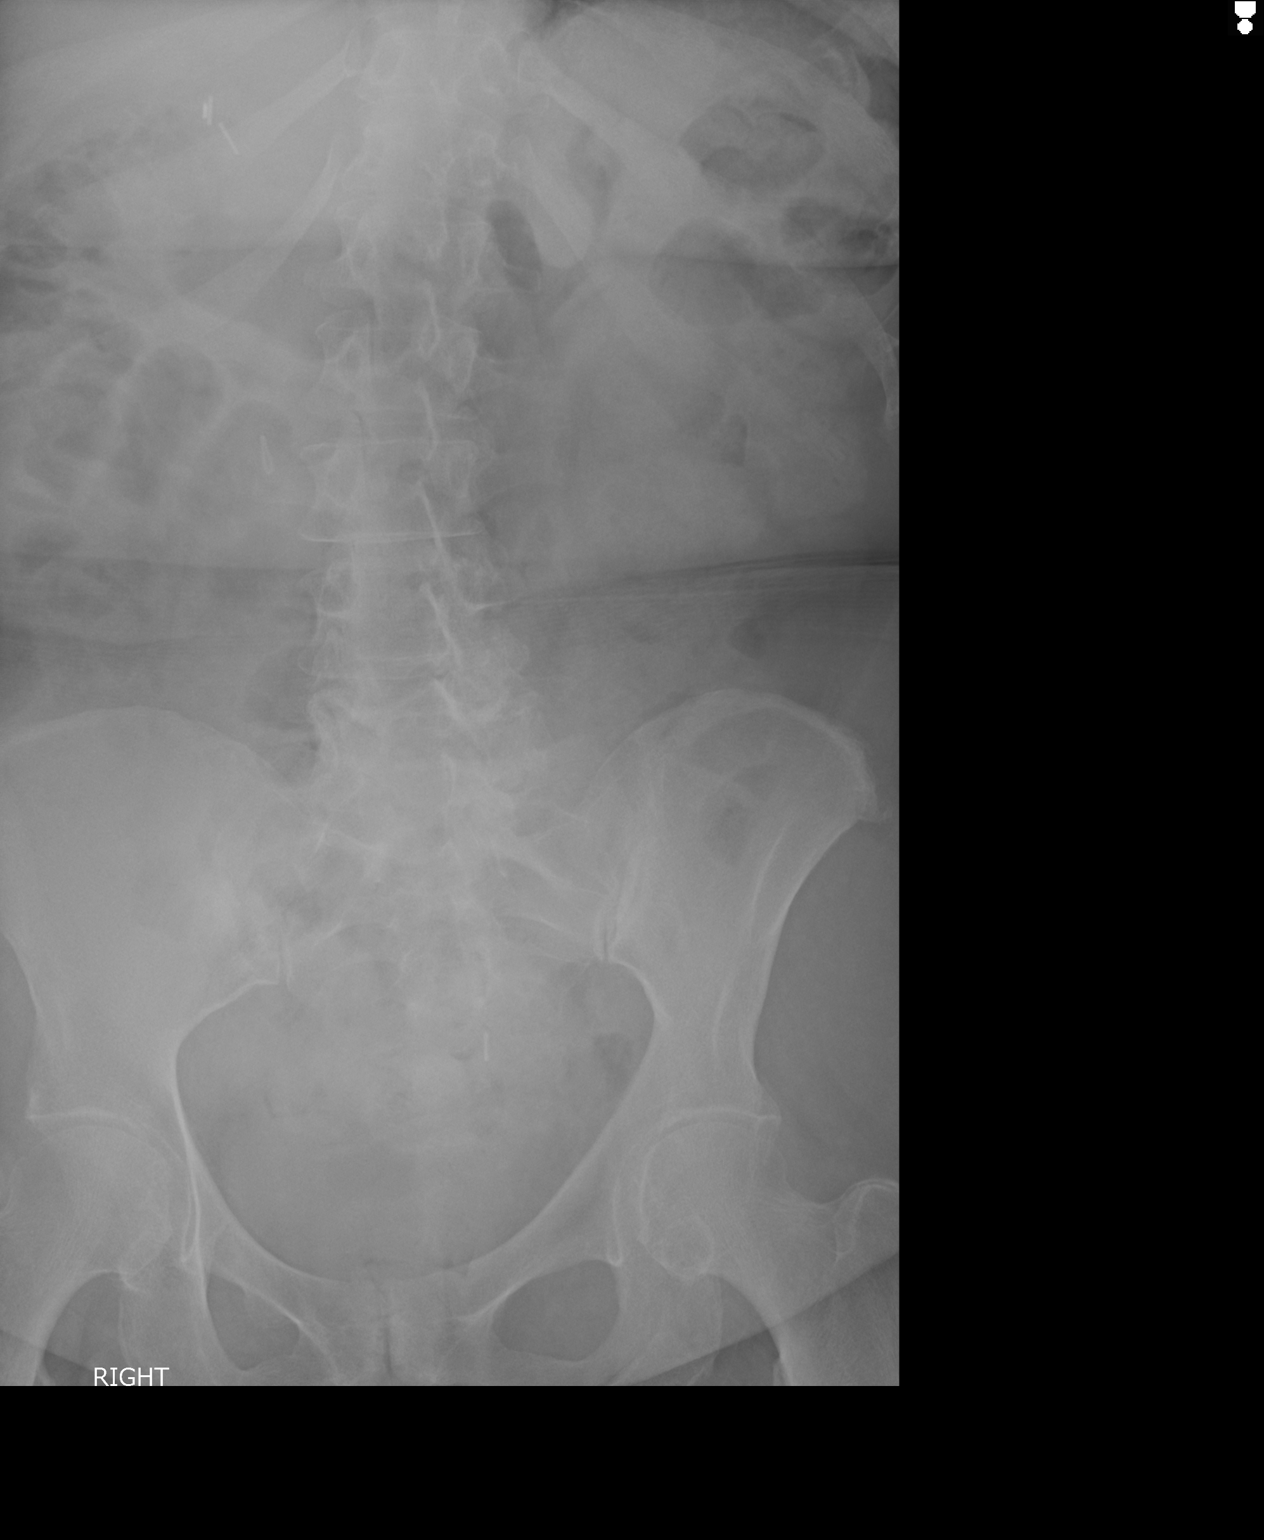

[1 of 1 positions shown; findings below may reference images not displayed]

FINDINGS: The bowel gas pattern is normal. No radio-opaque calculi or other
significant radiographic abnormality are seen. Status post
cholecystectomy. Significant amount of stool burden is not
identified on this radiograph.
IMPRESSION: No evidence of bowel obstruction or ileus. No significant stool
burden is noted.

## 2017-07-22 IMAGING — DX DG CHEST 2V
2 series · 2 of 2 positions shown · non-contrast
Comparison: None.

CLINICAL DATA: Cough and fever.

EXAM:
CHEST  2 VIEW

[chest lat]
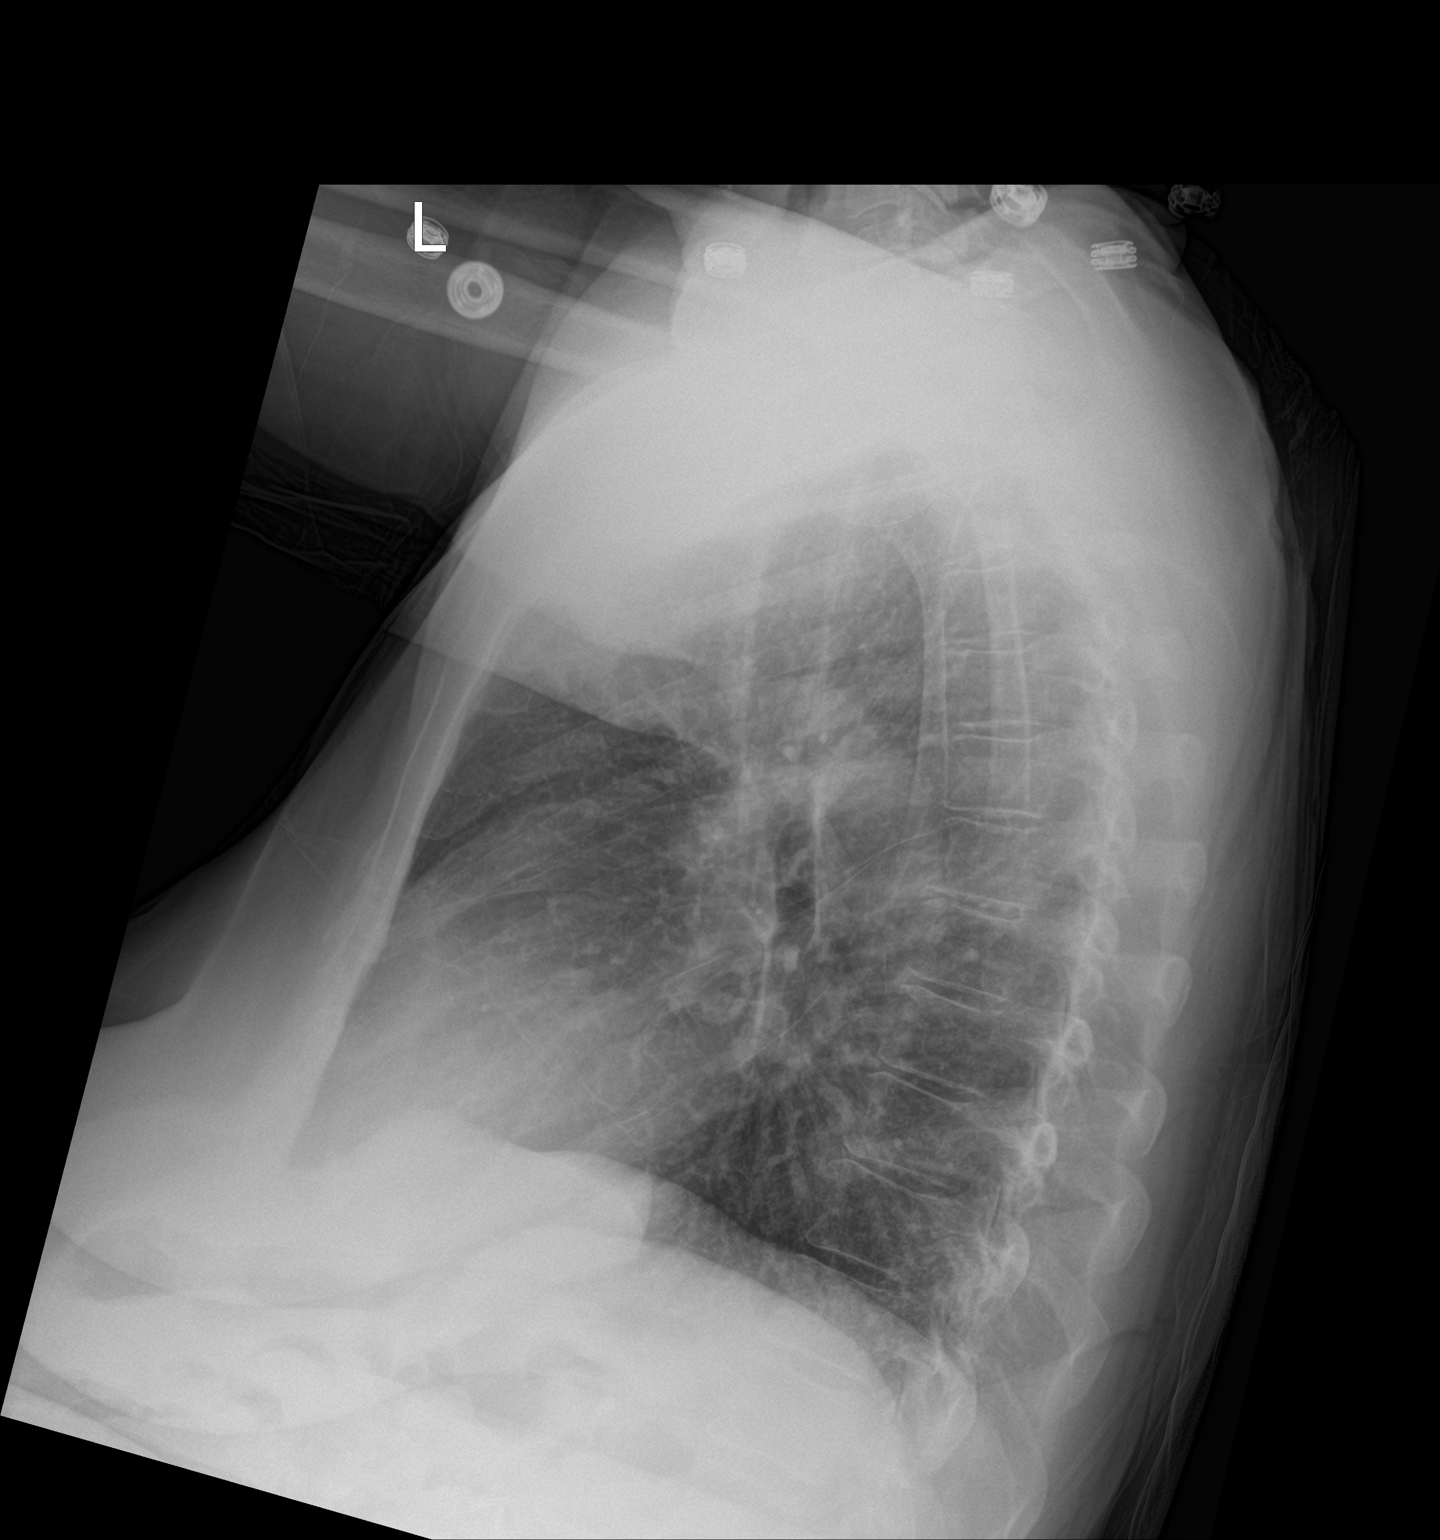

[chest ap]
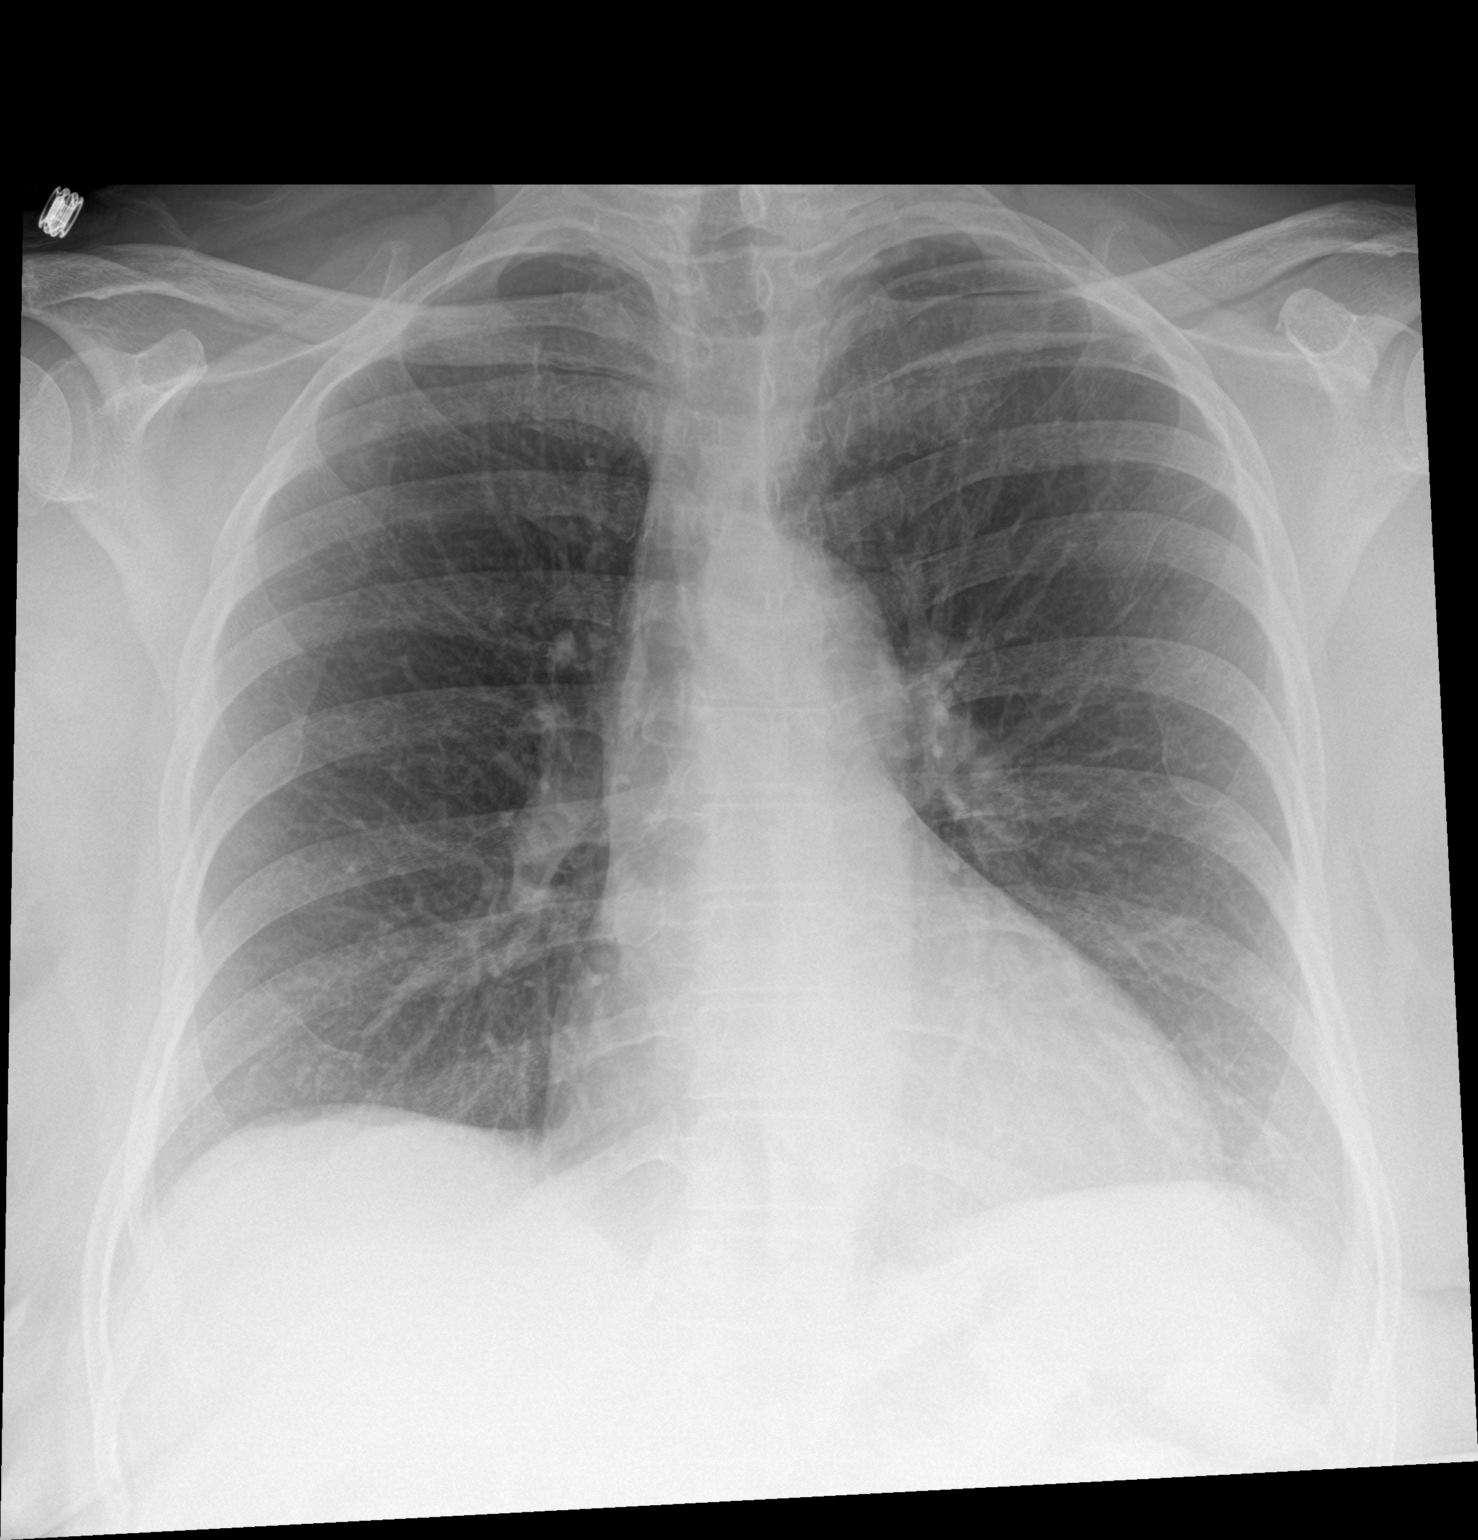

[2 of 2 positions shown; findings below may reference images not displayed]

FINDINGS: Mild cardiomegaly. The hila and mediastinum are normal. No pulmonary
nodules, masses, or focal infiltrates.
IMPRESSION: No active cardiopulmonary disease.

## 2017-08-11 ENCOUNTER — Ambulatory Visit: Payer: Medicare Other | Admitting: Nurse Practitioner

## 2017-10-10 ENCOUNTER — Emergency Department (HOSPITAL_COMMUNITY): Payer: Medicare Other

## 2017-10-10 ENCOUNTER — Emergency Department (HOSPITAL_COMMUNITY)
Admission: EM | Admit: 2017-10-10 | Discharge: 2017-10-11 | Disposition: A | Discharge status: Home / Self Care | Payer: Medicare Other | Attending: Emergency Medicine | Admitting: Emergency Medicine

## 2017-10-10 ENCOUNTER — Encounter (HOSPITAL_COMMUNITY): Payer: Self-pay | Admitting: *Deleted

## 2017-10-10 ENCOUNTER — Other Ambulatory Visit: Payer: Self-pay

## 2017-10-10 DIAGNOSIS — Z79899 Other long term (current) drug therapy: Secondary | ICD-10-CM | POA: Insufficient documentation

## 2017-10-10 DIAGNOSIS — I10 Essential (primary) hypertension: Secondary | ICD-10-CM | POA: Insufficient documentation

## 2017-10-10 DIAGNOSIS — G20 Parkinson's disease: Secondary | ICD-10-CM | POA: Insufficient documentation

## 2017-10-10 DIAGNOSIS — Z87891 Personal history of nicotine dependence: Secondary | ICD-10-CM | POA: Insufficient documentation

## 2017-10-10 DIAGNOSIS — E039 Hypothyroidism, unspecified: Secondary | ICD-10-CM | POA: Insufficient documentation

## 2017-10-10 DIAGNOSIS — R072 Precordial pain: Secondary | ICD-10-CM

## 2017-10-10 DIAGNOSIS — M542 Cervicalgia: Secondary | ICD-10-CM

## 2017-10-10 DIAGNOSIS — R079 Chest pain, unspecified: Secondary | ICD-10-CM | POA: Diagnosis present

## 2017-10-10 LAB — CBC
HEMATOCRIT: 37.1 % (ref 36.0–46.0)
Hemoglobin: 11.9 g/dL — ABNORMAL LOW (ref 12.0–15.0)
MCH: 27.8 pg (ref 26.0–34.0)
MCHC: 32.1 g/dL (ref 30.0–36.0)
MCV: 86.7 fL (ref 78.0–100.0)
Platelets: 262 10*3/uL (ref 150–400)
RBC: 4.28 MIL/uL (ref 3.87–5.11)
RDW: 12.9 % (ref 11.5–15.5)
WBC: 3.3 10*3/uL — AB (ref 4.0–10.5)

## 2017-10-10 LAB — BASIC METABOLIC PANEL WITH GFR
Anion gap: 11 (ref 5–15)
BUN: 10 mg/dL (ref 6–20)
CO2: 27 mmol/L (ref 22–32)
Calcium: 9.4 mg/dL (ref 8.9–10.3)
Chloride: 102 mmol/L (ref 101–111)
Creatinine, Ser: 0.73 mg/dL (ref 0.44–1.00)
GFR calc Af Amer: >60 mL/min
GFR calc non Af Amer: >60 mL/min
Glucose, Bld: 79 mg/dL (ref 65–99)
Potassium: 3.5 mmol/L (ref 3.5–5.1)
Sodium: 140 mmol/L (ref 135–145)

## 2017-10-10 LAB — TROPONIN I: Troponin I: <0.03 ng/mL (ref <0.03)

## 2017-10-10 MED ORDER — FENTANYL CITRATE (PF) 100 MCG/2ML IJ SOLN
50.0000 ug | Freq: Once | INTRAMUSCULAR | Status: AC
  Administered 2017-10-11: 50 ug via INTRAVENOUS
  Filled 2017-10-10: qty 2

## 2017-10-10 NOTE — ED Provider Notes (Signed)
[facility] EMERGENCY DEPARTMENT Provider Note   CSN: [id number] Arrival date & time: 10/10/17  2041     History   Chief Complaint Chief Complaint  Patient presents with  . Chest Pain    HPI MISTEE SOLIMAN is a 72 y.o. female.  The history is provided by the patient.  Chest Pain   This is a new problem. The current episode started 6 to 12 hours ago. The problem occurs constantly. The problem has not changed since onset.The pain is present in the substernal region. The pain is moderate. The pain does not radiate. The symptoms are aggravated by certain positions. Pertinent negatives include no abdominal pain, no back pain, no diaphoresis, no fever, no lower extremity edema, no numbness, no shortness of breath, no syncope, no vomiting and no weakness. She has tried nothing for the symptoms. Risk factors include being elderly.  Her past medical history is significant for hypertension.  Pertinent negatives for past medical history include no CAD and no PE.   Patient reports that she started having chest pain about 6 to 12 hours ago.  It occurred at rest and is not improved.  It is not radiating.  It seems to be worse with palpation/movement.  She denies shortness of breath/diaphoresis.  She also reports right-sided neck pain that is similar to prior episodes.  She is unsure what causes this neck pain.  The chest pain does not radiate to her neck Past Medical History:  Diagnosis Date  . Breast cancer ([place]) 11/2011  . Chronic back pain   . Chronic leg pain    bilateral  . Chronic leukopenia   . DDD (degenerative disc disease), cervical   . DDD (degenerative disc disease), lumbar   . Gait abnormality   . GERD (gastroesophageal reflux disease)   . Hypertension   . Parkinson's disease ([place])   . Radiation 04/2012   33 treatments for breast cancer  . Right knee pain   . Thyroid disease     Patient Active Problem List   Diagnosis Date Noted  . Chest pain 01/05/2017  . Breast  cancer, left breast ([place]) 11/02/2015  . Difficulty walking 10/20/2013  . Parkinson disease ([place]) 08/24/2013  . Hypothyroid 05/05/2013  . Hypertension 05/05/2013    Past Surgical History:  Procedure Laterality Date  . ABDOMINAL HYSTERECTOMY     partial  . BREAST LUMPECTOMY Left 12/2011  . CHOLECYSTECTOMY    . COLONOSCOPY N/A 07/01/2013   Procedure: COLONOSCOPY;  Surgeon: Rogene Houston, MD;  Location: [facility];  Service: Endoscopy;  Laterality: N/A;  1030  . TUBAL LIGATION       OB History    Gravida  3   Para  3   Term  3   Preterm      AB      Living  3     SAB      TAB      Ectopic      Multiple      Live Births               Home Medications    Prior to Admission medications   Medication Sig Start Date End Date Taking? Authorizing Provider  acetaminophen (TYLENOL) 500 MG tablet Take 500 mg by mouth 3 (three) times daily as needed.    [provider]  amLODipine (NORVASC) 5 MG tablet Take 5 mg by mouth every morning.     [provider]  anastrozole (ARIMIDEX) 1  MG tablet TAKE 1 TABLET EVERY MORNING 05/26/16   Pen[place], Kelby Fam, MD  calcium carbonate (OS-CAL) 600 MG TABS tablet Take 600 mg by mouth 2 (two) times daily with a meal.    [provider]  carbidopa-levodopa (SINEMET IR) 25-100 MG tablet Take 1 tablet by mouth 3 (three) times daily. 06/04/16   Marcial Pacas, MD  Cholecalciferol (VITAMIN D-3) 1000 UNITS CAPS Take 1,000 Units by mouth 2 (two) times daily.    [provider]  docusate sodium (COLACE) 100 MG capsule Take 100 mg by mouth daily.    [provider]  levothyroxine (SYNTHROID, LEVOTHROID) 100 MCG tablet Take 100 mcg by mouth daily before breakfast.    [provider]  Melatonin 3 MG CAPS Take 3 mg by mouth at bedtime.    [provider]  meloxicam (MOBIC) 15 MG tablet Take 15 mg by mouth daily.    [provider]  Misc Natural Products (TART CHERRY ADVANCED PO)  Take 1 tablet by mouth daily.    [provider]  pramipexole (MIRAPEX) 1 MG tablet Take 1.5 tablets (1.5 mg total) by mouth 3 (three) times daily. 12/03/16   Dennie Bible, NP  traZODone (DESYREL) 50 MG tablet TAKE 1 TABLET BY MOUT AT BEDTIME AS NEEDED FOR SLEEP 11/08/15   [provider]  triamcinolone cream (KENALOG) 0.1 % APPLY TO AFFECTED AREA UP TO TWICE DAILY AS NEEDED (NOT TO FACE, GROIN, OR UNDERARMS) 03/12/16   [provider]  Turmeric Curcumin 500 MG CAPS Take 1 capsule by mouth daily.    [provider]    Family History Family History  Problem Relation Age of Onset  . Cancer Mother 52       colon cancer  . Cancer Father 75       throat cancer  . Pancreatic cancer Brother        [phone] 11-14-15    Social History Social History   Tobacco Use  . Smoking status: Former Smoker    Packs/day: 0.50    Years: 18.00    Pack years: 9.00    Types: Cigarettes    Last attempt to quit: 09/30/1998    Years since quitting: 19.0  . Smokeless tobacco: Never Used  Substance Use Topics  . Alcohol use: No  . Drug use: No     Allergies   Hydrocodone and Tramadol   Review of Systems Review of Systems  Constitutional: Negative for diaphoresis and fever.  Respiratory: Negative for shortness of breath.   Cardiovascular: Positive for chest pain. Negative for syncope.       Chronic swelling to right leg from prior knee surgery   Gastrointestinal: Negative for abdominal pain and vomiting.  Musculoskeletal: Positive for arthralgias. Negative for back pain and neck pain.  Neurological: Negative for weakness and numbness.  All other systems reviewed and are negative.    Physical Exam Updated Vital Signs BP (!) 178/77   Pulse 66   Temp 97.9 F (36.6 C) (Oral)   Resp 11   Ht 1.549 m (5\' 1" )   Wt 72.6 kg (160 lb)   SpO2 100%   BMI 30.23 kg/m   Physical Exam  CONSTITUTIONAL: Elderly, no acute distress HEAD:  Normocephalic/atraumatic EYES: EOMI/PERRL ENMT: Mucous membranes moist NECK: supple no meningeal signs SPINE/BACK:entire spine nontender, cervical paraspinal tenderness, no erythema or abscess CV: S1/S2 noted, no murmurs/rubs/gallops noted LUNGS: Lungs are clear to auscultation bilaterally, no apparent distress Chest-diffuse chest wall tenderness, no crepitus  ABDOMEN: soft, nontender, no rebound or guarding, bowel sounds noted throughout abdomen GU:no cva tenderness NEURO: Pt is awake/alert/appropriate, moves all extremitiesx4.  No facial droop.  No arm drift.  Equal handgrips.  No focal sensory deficit in arms. EXTREMITIES: pulses normal/equal x4, full ROM SKIN: warm, color normal, scarring and chronic swelling noted to right knee that is unchanged PSYCH: no abnormalities of mood noted, alert and oriented to situation  ED Treatments / Results  Labs (all labs ordered are listed, but only abnormal results are displayed) Labs Reviewed  CBC - Abnormal; Notable for the following components:      Result Value   WBC 3.3 (*)    Hemoglobin 11.9 (*)    All other components within normal limits  HEPATIC FUNCTION PANEL - Abnormal; Notable for the following components:   ALT 12 (*)    All other components within normal limits  BASIC METABOLIC PANEL  TROPONIN I  LIPASE, BLOOD  TROPONIN I    EKG EKG Interpretation  Date/Time:  Friday October 10 2017 20:52:18 EDT Ventricular Rate:  71 PR Interval:  124 QRS Duration: 86 QT Interval:  392 QTC Calculation: 425 R Axis:   14 Text Interpretation:  Normal sinus rhythm Minimal voltage criteria for LVH, may be normal variant Nonspecific T wave abnormality Abnormal ECG No significant change since last tracing Confirmed by Ripley Fraise [phone]) on 10/10/2017 11:39:14 PM   Radiology Dg Chest 2 View  Result Date: 10/10/2017 CLINICAL DATA:  Mid central chest pain and posterior neck pain starting today. History of breast cancer, hypertension,  gastroesophageal reflux disease. EXAM: CHEST - 2 VIEW COMPARISON:  05/02/2017 FINDINGS: The heart size and mediastinal contours are within normal limits. Both lungs are clear. The visualized skeletal structures are unremarkable. IMPRESSION: No active cardiopulmonary disease. Electronically Signed   By: Lucienne Capers M.D.   On: 10/10/2017 21:24    Procedures Procedures   Medications Ordered in ED Medications  fentaNYL (SUBLIMAZE) injection 50 mcg (50 mcg Intravenous Given 10/11/17 0001)  acetaminophen (TYLENOL) tablet 650 mg (650 mg Oral Given 10/11/17 0135)     Initial Impression / Assessment and Plan / ED Course  I have reviewed the triage vital signs and the nursing notes.  Pertinent labs & imaging results that were available during my care of the patient were reviewed by me and considered in my medical decision making (see chart for details).     12:06 AM Patient presents with 2 complaints.  Her neck pain, that she admits to having previously.  There is no obvious neuro deficits.  She has had this worked up previously.  I do not feel this indicates an acute neurologic emergency She also was admitted last year for similar chest pain episode with extensive work-up.  She had a negative CT chest that time as well as low risk stress imaging. We will get repeat troponin here in the emergency department   Labs reassuring.  Repeat troponin negative.  She is in no distress.  She reports her chest pain is improved. Unclear cause of her chest pain, but it was reproducible on exam.  With 2 negative troponins, I feel ACS is been ruled out.  Doubt PE given her history.  Chest x-ray reviewed and is negative No signs of acute aortic dissection I do not feel admission is required at this time  For her neck pain, she has had this documented previously.  It appears to be musculoskeletal in nature.  She has no signs of any  neurologic deficits. She continues to have some neck pain but with some  improvement.  She is requesting Tylenol and discharge home Patient wanting to go home.  We discussed strict return precautions Final Clinical Impressions(s) / ED Diagnoses   Final diagnoses:  Precordial pain  Neck pain    ED Discharge Orders    None       Ripley Fraise, MD 10/11/17 [phone]

## 2017-10-10 NOTE — ED Triage Notes (Signed)
Pt c/o mid center chest pain and posterior neck pain that started today, became worse tonight after eating dinner, denies any sob, n/v

## 2017-10-11 ENCOUNTER — Encounter (HOSPITAL_COMMUNITY): Payer: Self-pay | Admitting: Cardiology

## 2017-10-11 ENCOUNTER — Emergency Department (HOSPITAL_COMMUNITY)
Admission: EM | Admit: 2017-10-11 | Discharge: 2017-10-11 | Disposition: A | Discharge status: Home / Self Care | Payer: Medicare Other | Source: Home / Self Care | Attending: Emergency Medicine | Admitting: Emergency Medicine

## 2017-10-11 ENCOUNTER — Emergency Department (HOSPITAL_COMMUNITY): Payer: Medicare Other

## 2017-10-11 DIAGNOSIS — E039 Hypothyroidism, unspecified: Secondary | ICD-10-CM

## 2017-10-11 DIAGNOSIS — K219 Gastro-esophageal reflux disease without esophagitis: Secondary | ICD-10-CM

## 2017-10-11 DIAGNOSIS — Z853 Personal history of malignant neoplasm of breast: Secondary | ICD-10-CM | POA: Insufficient documentation

## 2017-10-11 DIAGNOSIS — Z79899 Other long term (current) drug therapy: Secondary | ICD-10-CM

## 2017-10-11 DIAGNOSIS — G20 Parkinson's disease: Secondary | ICD-10-CM

## 2017-10-11 DIAGNOSIS — M542 Cervicalgia: Secondary | ICD-10-CM | POA: Insufficient documentation

## 2017-10-11 DIAGNOSIS — Z87891 Personal history of nicotine dependence: Secondary | ICD-10-CM

## 2017-10-11 DIAGNOSIS — I10 Essential (primary) hypertension: Secondary | ICD-10-CM

## 2017-10-11 DIAGNOSIS — R0789 Other chest pain: Secondary | ICD-10-CM

## 2017-10-11 DIAGNOSIS — R079 Chest pain, unspecified: Secondary | ICD-10-CM

## 2017-10-11 LAB — CBC
HEMATOCRIT: 39.1 % (ref 36.0–46.0)
Hemoglobin: 12.6 g/dL (ref 12.0–15.0)
MCH: 27.6 pg (ref 26.0–34.0)
MCHC: 32.2 g/dL (ref 30.0–36.0)
MCV: 85.7 fL (ref 78.0–100.0)
Platelets: 294 10*3/uL (ref 150–400)
RBC: 4.56 MIL/uL (ref 3.87–5.11)
RDW: 12.6 % (ref 11.5–15.5)
WBC: 5 10*3/uL (ref 4.0–10.5)

## 2017-10-11 LAB — TROPONIN I: Troponin I: <0.03 ng/mL (ref <0.03)

## 2017-10-11 LAB — BASIC METABOLIC PANEL
Anion gap: 10 (ref 5–15)
BUN: 8 mg/dL (ref 6–20)
CHLORIDE: 101 mmol/L (ref 101–111)
CO2: 25 mmol/L (ref 22–32)
Calcium: 9.4 mg/dL (ref 8.9–10.3)
Creatinine, Ser: 0.65 mg/dL (ref 0.44–1.00)
GFR calc Af Amer: >60 mL/min (ref >60)
GFR calc non Af Amer: >60 mL/min (ref >60)
Glucose, Bld: 114 mg/dL — ABNORMAL HIGH (ref 65–99)
POTASSIUM: 3.4 mmol/L — AB (ref 3.5–5.1)
SODIUM: 136 mmol/L (ref 135–145)

## 2017-10-11 LAB — HEPATIC FUNCTION PANEL
ALBUMIN: 3.5 g/dL (ref 3.5–5.0)
ALT: 12 U/L — ABNORMAL LOW (ref 14–54)
AST: 15 U/L (ref 15–41)
Alkaline Phosphatase: 111 U/L (ref 38–126)
BILIRUBIN DIRECT: 0.1 mg/dL (ref 0.1–0.5)
BILIRUBIN INDIRECT: 0.3 mg/dL (ref 0.3–0.9)
BILIRUBIN TOTAL: 0.4 mg/dL (ref 0.3–1.2)
TOTAL PROTEIN: 7.3 g/dL (ref 6.5–8.1)

## 2017-10-11 LAB — LIPASE, BLOOD: LIPASE: 25 U/L (ref 11–51)

## 2017-10-11 MED ORDER — GI COCKTAIL ~~LOC~~
30.0000 mL | Freq: Once | ORAL | Status: AC
  Administered 2017-10-11: 30 mL via ORAL
  Filled 2017-10-11: qty 30

## 2017-10-11 MED ORDER — PANTOPRAZOLE SODIUM 20 MG PO TBEC: 20.0000 mg | DELAYED_RELEASE_TABLET | Freq: Every day | ORAL | 2 refills | Status: DC

## 2017-10-11 MED ORDER — ACETAMINOPHEN 325 MG PO TABS
650.0000 mg | ORAL_TABLET | Freq: Once | ORAL | Status: AC
  Administered 2017-10-11: 650 mg via ORAL
  Filled 2017-10-11: qty 2

## 2017-10-11 NOTE — ED Notes (Signed)
ED Provider at bedside. 

## 2017-10-11 NOTE — Discharge Instructions (Addendum)

## 2017-10-11 NOTE — Discharge Instructions (Addendum)
The testing today is reassuring that you are not having a heart attack or likely to have one soon.  It seems that your chest discomfort is related to heartburn or GERD.  To help treat this we are prescribing Protonix, a proton pump inhibitor.  This can take 1 or 2 weeks to work, so until that time use Maalox 2 tablespoons before meals and at bedtime to help control your symptoms.  It is important to get plenty of rest, drink a lot of fluids and follow-up with your primary care doctor and cardiologist.  Please call your cardiologist on Monday morning to schedule the follow-up appointment.  For the neck pain which you are having, try using heat on the sore area.  You can always return here if needed for problems or additional concerns.

## 2017-10-11 NOTE — ED Notes (Signed)
Received report on pt, pt c/o neck pain, pt repositioned, update given,

## 2017-10-11 NOTE — ED Notes (Signed)
Patient transported to X-ray 

## 2017-10-11 NOTE — ED Notes (Signed)
EDP at bedside updating patient and family. 

## 2017-10-11 NOTE — ED Provider Notes (Signed)
[facility] EMERGENCY DEPARTMENT Provider Note   CSN: [id number] Arrival date & time: 10/11/17  1226     History   Chief Complaint Chief Complaint  Patient presents with  . Chest Pain    HPI Lisa Torres is a 72 y.o. female.  Patient presents for evaluation of persistent chest pain which started last evening.  She was evaluated here for that yesterday evening.  Today she became more concerned after she had persistent pain, then vomited twice.  This occurred after she ate breakfast.  Subsequent to that she took a oxycodone pain pill, and it helped her pain.  She has not had any more vomiting.  She has a "tight feeling" in the center of her chest.  Sometimes she feels like she has shortness of breath, and rapid heartbeat, since last evening.  She does not have chronic ongoing cardiac problems.  She has seen a cardiologist in the past, for "murmur."  She is here with her sister who recently had a myocardial infarction, is currently in cardiac rehab.  The patient sister is very concerned that the patient may have a acute cardiac disorder and needs to see her cardiologist.  There is been no diaphoresis, focal weakness or paresthesia.  She is taking her usual medications including this morning.  There are no other known modifying factors.  HPI  Past Medical History:  Diagnosis Date  . Breast cancer ([place]) 11/2011  . Chronic back pain   . Chronic leg pain    bilateral  . Chronic leukopenia   . DDD (degenerative disc disease), cervical   . DDD (degenerative disc disease), lumbar   . Gait abnormality   . GERD (gastroesophageal reflux disease)   . Hypertension   . Parkinson's disease ([place])   . Radiation 04/2012   33 treatments for breast cancer  . Right knee pain   . Thyroid disease     Patient Active Problem List   Diagnosis Date Noted  . Chest pain 01/05/2017  . Breast cancer, left breast ([place]) 11/02/2015  . Difficulty walking 10/20/2013  . Parkinson disease ([place])  08/24/2013  . Hypothyroid 05/05/2013  . Hypertension 05/05/2013    Past Surgical History:  Procedure Laterality Date  . ABDOMINAL HYSTERECTOMY     partial  . BREAST LUMPECTOMY Left 12/2011  . CHOLECYSTECTOMY    . COLONOSCOPY N/A 07/01/2013   Procedure: COLONOSCOPY;  Surgeon: Rogene Houston, MD;  Location: [facility] ENDO [facility];  Service: Endoscopy;  Laterality: N/A;  1030  . TUBAL LIGATION       OB History    Gravida  3   Para  3   Term  3   Preterm      AB      Living  3     SAB      TAB      Ectopic      Multiple      Live Births               Home Medications    Prior to Admission medications   Medication Sig Start Date End Date Taking? Authorizing Provider  acetaminophen (TYLENOL) 500 MG tablet Take 500 mg by mouth 3 (three) times daily as needed.    [provider]  amLODipine (NORVASC) 5 MG tablet Take 5 mg by mouth every morning.     [provider]  anastrozole (ARIMIDEX) 1 MG tablet TAKE 1 TABLET EVERY MORNING 05/26/16   Penland, Kelby Fam, MD  calcium  carbonate (OS-CAL) 600 MG TABS tablet Take 600 mg by mouth 2 (two) times daily with a meal.    [provider]  carbidopa-levodopa (SINEMET IR) 25-100 MG tablet Take 1 tablet by mouth 3 (three) times daily. 06/04/16   Marcial Pacas, MD  Cholecalciferol (VITAMIN D-3) 1000 UNITS CAPS Take 1,000 Units by mouth 2 (two) times daily.    [provider]  docusate sodium (COLACE) 100 MG capsule Take 100 mg by mouth daily.    [provider]  levothyroxine (SYNTHROID, LEVOTHROID) 100 MCG tablet Take 100 mcg by mouth daily before breakfast.    [provider]  Melatonin 3 MG CAPS Take 3 mg by mouth at bedtime.    [provider]  meloxicam (MOBIC) 15 MG tablet Take 15 mg by mouth daily.    [provider]  Misc Natural Products (TART CHERRY ADVANCED PO) Take 1 tablet by mouth daily.    [provider]  pantoprazole (PROTONIX) 20 MG tablet  Take 1 tablet (20 mg total) by mouth daily. 10/11/17   Daleen Bo, MD  pramipexole (MIRAPEX) 1 MG tablet Take 1.5 tablets (1.5 mg total) by mouth 3 (three) times daily. 12/03/16   Dennie Bible, NP  traZODone (DESYREL) 50 MG tablet TAKE 1 TABLET BY MOUT AT BEDTIME AS NEEDED FOR SLEEP 11/08/15   [provider]  triamcinolone cream (KENALOG) 0.1 % APPLY TO AFFECTED AREA UP TO TWICE DAILY AS NEEDED (NOT TO FACE, GROIN, OR UNDERARMS) 03/12/16   [provider]  Turmeric Curcumin 500 MG CAPS Take 1 capsule by mouth daily.    [provider]    Family History Family History  Problem Relation Age of Onset  . Cancer Mother 3       colon cancer  . Cancer Father 75       throat cancer  . Pancreatic cancer Brother        [phone] 11-14-15    Social History Social History   Tobacco Use  . Smoking status: Former Smoker    Packs/day: 0.50    Years: 18.00    Pack years: 9.00    Types: Cigarettes    Last attempt to quit: 09/30/1998    Years since quitting: 19.0  . Smokeless tobacco: Never Used  Substance Use Topics  . Alcohol use: No  . Drug use: No     Allergies   Hydrocodone and Tramadol   Review of Systems Review of Systems  All other systems reviewed and are negative.    Physical Exam Updated Vital Signs BP (!) 175/74   Pulse 66   Temp 98.2 F (36.8 C) (Oral)   Resp (!) 21   Ht 5\' 1"  (1.549 m)   Wt 72.6 kg (160 lb)   SpO2 100%   BMI 30.23 kg/m   Physical Exam  Constitutional: She is oriented to person, place, and time. She appears well-developed.  Elderly, frail  HENT:  Head: Normocephalic and atraumatic.  Right Ear: External ear normal.  Left Ear: External ear normal.  Eyes: Pupils are equal, round, and reactive to light. Conjunctivae and EOM are normal.  Neck: Normal range of motion and phonation normal. Neck supple.  Cardiovascular: Normal rate, regular rhythm and normal heart sounds.  Pulmonary/Chest: Effort normal and breath  sounds normal. She exhibits tenderness (Anterior, diffuse, mild). She exhibits no bony tenderness.  Abdominal: Soft. She exhibits no mass. There is tenderness (Epigastric, mild). There is no rebound and no guarding.  Musculoskeletal: Normal  range of motion. She exhibits no edema, tenderness or deformity.  Neurological: She is alert and oriented to person, place, and time. No cranial nerve deficit or sensory deficit. She exhibits normal muscle tone. Coordination normal.  Mild tremor is present.  Sister states this is been there for many years.  No dysarthria or aphasia.  Skin: Skin is warm, dry and intact.  Psychiatric: She has a normal mood and affect. Her behavior is normal. Judgment and thought content normal.  Nursing note and vitals reviewed.    ED Treatments / Results  Labs (all labs ordered are listed, but only abnormal results are displayed) Labs Reviewed  BASIC METABOLIC PANEL - Abnormal; Notable for the following components:      Result Value   Potassium 3.4 (*)    Glucose, Bld 114 (*)    All other components within normal limits  CBC  TROPONIN I    EKG EKG Interpretation  Date/Time:  Saturday October 11 2017 12:34:19 EDT Ventricular Rate:  88 PR Interval:    QRS Duration: 87 QT Interval:  362 QTC Calculation: 438 R Axis:   49 Text Interpretation:  Sinus rhythm Consider left ventricular hypertrophy since last tracing no significant change Confirmed by Daleen Bo [phone]) on 10/11/2017 1:18:28 PM   Radiology Dg Chest 2 View  Result Date: 10/11/2017 CLINICAL DATA:  Chest pain. EXAM: CHEST - 2 VIEW COMPARISON:  October 10, 2017 FINDINGS: The heart size and mediastinal contours are within normal limits. Both lungs are clear. The visualized skeletal structures are unremarkable. IMPRESSION: No active cardiopulmonary disease. Electronically Signed   By: Dorise Bullion III M.D   On: 10/11/2017 13:40   Dg Chest 2 View  Result Date: 10/10/2017 CLINICAL DATA:  Mid central  chest pain and posterior neck pain starting today. History of breast cancer, hypertension, gastroesophageal reflux disease. EXAM: CHEST - 2 VIEW COMPARISON:  05/02/2017 FINDINGS: The heart size and mediastinal contours are within normal limits. Both lungs are clear. The visualized skeletal structures are unremarkable. IMPRESSION: No active cardiopulmonary disease. Electronically Signed   By: Lucienne Capers M.D.   On: 10/10/2017 21:24    Procedures Procedures (including critical care time)  Medications Ordered in ED Medications  gi cocktail (Maalox,Lidocaine,Donnatal) (30 mLs Oral Given 10/11/17 1459)     Initial Impression / Assessment and Plan / ED Course  I have reviewed the triage vital signs and the nursing notes.  Pertinent labs & imaging results that were available during my care of the patient were reviewed by me and considered in my medical decision making (see chart for details).      Patient Vitals for the past 24 hrs:  BP Temp Temp src Pulse Resp SpO2 Height Weight  10/11/17 1530 (!) 175/74 - - 66 (!) 21 100 % - -  10/11/17 1500 (!) 187/88 - - 74 16 100 % - -  10/11/17 1430 (!) 186/81 - - 69 16 100 % - -  10/11/17 1400 (!) 189/88 - - 73 13 100 % - -  10/11/17 1330 (!) 163/75 - - 69 18 99 % - -  10/11/17 1300 (!) 161/81 - - 69 15 98 % - -  10/11/17 1233 (!) 178/91 98.2 F (36.8 C) Oral 81 14 96 % - -  10/11/17 1232 - - - - - - 5\' 1"  (1.549 m) 72.6 kg (160 lb)    4:00 PM Reevaluation with update and discussion. After initial assessment and treatment, an updated evaluation reveals at this time  the patient's blood pressure improved and her chest discomfort has completely resolved.  She has mild soreness in the right side of her neck which she states has been present for a long time.  Findings discussed with patient and sister, all questions answered. [place] decision making-nonspecific chest discomfort, and neck pain.  Patient symptoms improved after  treatment with GI cocktail.  This is a second ED visit for chest pain, first last night.  Patient is low risk for acute coronary syndrome, and has a reassuring evaluation.  She has access to follow-up care.  Doubt ACS, PE or pneumonia.  Nursing Notes Reviewed/ Care Coordinated Applicable Imaging Reviewed Interpretation of Laboratory Data incorporated into ED treatment  The patient appears reasonably screened and/or stabilized for discharge and I doubt any other medical condition or other [facility] requiring further screening, evaluation, or treatment in the ED at this time prior to discharge.  Plan: Home Medications-continue current home medications; Home Treatments-rest, heat to sore area neck; return here if the recommended treatment, does not improve the symptoms; Recommended follow up-PCP follow-up PRN.  Cardiology follow-up regarding hypertension and chest pain as soon as possible.  Family to call for an appointment.      Final Clinical Impressions(s) / ED Diagnoses   Final diagnoses:  Nonspecific chest pain  Gastroesophageal reflux disease, esophagitis presence not specified  Neck pain    ED Discharge Orders        Ordered    pantoprazole (PROTONIX) 20 MG tablet  Daily     10/11/17 1616       Daleen Bo, MD 10/11/17 1622

## 2017-10-11 NOTE — ED Notes (Signed)
Pt given gingerale with EDP approval.  

## 2017-10-11 NOTE — ED Triage Notes (Signed)
Chest and neck pain  since yesterday.  Seen here with same last night.  Pt states she vomited this morning after breakfast and now her pain is worse.

## 2017-10-11 NOTE — ED Notes (Signed)
EDP at bedside  

## 2017-10-21 ENCOUNTER — Encounter: Payer: Self-pay | Admitting: Cardiology

## 2017-10-21 ENCOUNTER — Ambulatory Visit: Payer: Medicare Other | Admitting: Cardiology

## 2017-10-21 VITALS — BP 128/78 | HR 80 | Ht 61.5 in

## 2017-10-21 DIAGNOSIS — R1084 Generalized abdominal pain: Secondary | ICD-10-CM | POA: Diagnosis not present

## 2017-10-21 NOTE — Patient Instructions (Addendum)
Your physician wants you to follow-up in:6 months with Dr.Branch You will receive a reminder letter in the mail two months in advance. If you don't receive a letter, please call our office to schedule the follow-up appointment.      You have been referred to GI, Dr Laural Golden, they will call you for an apt [phone]     Your physician recommends that you continue on your current medications as directed. Please refer to the Current Medication list given to you today.    If you need a refill on your cardiac medications before your next appointment, please call your pharmacy.     No lab work or tests ordered today      Thank you for choosing [facility] !

## 2017-10-21 NOTE — Progress Notes (Signed)
Clinical Summary Lisa Torres is a 72 y.o.female seen for follow up of the following medical problems.    1. History of atypical chest pain - admit 12/2016 with atypical chest pain. Negative lexiscan at that time  - seen in ER 09/2017 with chest pain on 2 separate days back to back - atypical symptoms for cardiac etiology. Tender to palpation.  - N/V - trop neg x 2, CXR no acute process, EKG SR without ischemic changes.   - pressure like pain, midchest/epigastric. 7/10 in severity. Feels hot. No SOB. Some nausea with vomiting.  - lasts 5-10 minutes, reoccurs. Can occur at rest or with activity.  - episdoe on Friday occurred after eating dinner. Mild burning feeling in throat. - areas tender to palpation in epigastric area. Worst with position.  - pain improved with antacids.  2. HTN - home bp's rund 130s/70s.  - compliant with meds   Past Medical History:  Diagnosis Date  . Breast cancer ([place]) 11/2011  . Chronic back pain   . Chronic leg pain    bilateral  . Chronic leukopenia   . DDD (degenerative disc disease), cervical   . DDD (degenerative disc disease), lumbar   . Gait abnormality   . GERD (gastroesophageal reflux disease)   . Hypertension   . Parkinson's disease ([place])   . Radiation 04/2012   33 treatments for breast cancer  . Right knee pain   . Thyroid disease      Allergies  Allergen Reactions  . Hydrocodone   . Tramadol     Hallucinations, abd pain     Current Outpatient Medications  Medication Sig Dispense Refill  . acetaminophen (TYLENOL) 500 MG tablet Take 500 mg by mouth 3 (three) times daily as needed.    Marland Kitchen amLODipine (NORVASC) 5 MG tablet Take 5 mg by mouth every morning.     Marland Kitchen anastrozole (ARIMIDEX) 1 MG tablet TAKE 1 TABLET EVERY MORNING 90 tablet 3  . calcium carbonate (OS-CAL) 600 MG TABS tablet Take 600 mg by mouth 2 (two) times daily with a meal.    . carbidopa-levodopa (SINEMET IR) 25-100 MG tablet Take 1 tablet by mouth 3 (three)  times daily. 90 tablet 11  . Cholecalciferol (VITAMIN D-3) 1000 UNITS CAPS Take 1,000 Units by mouth 2 (two) times daily.    Marland Kitchen docusate sodium (COLACE) 100 MG capsule Take 100 mg by mouth daily.    Marland Kitchen levothyroxine (SYNTHROID, LEVOTHROID) 100 MCG tablet Take 100 mcg by mouth daily before breakfast.    . Melatonin 3 MG CAPS Take 3 mg by mouth at bedtime.    . meloxicam (MOBIC) 15 MG tablet Take 15 mg by mouth daily.    . Misc Natural Products (TART CHERRY ADVANCED PO) Take 1 tablet by mouth daily.    . pantoprazole (PROTONIX) 20 MG tablet Take 1 tablet (20 mg total) by mouth daily. 30 tablet 2  . pramipexole (MIRAPEX) 1 MG tablet Take 1.5 tablets (1.5 mg total) by mouth 3 (three) times daily. 405 tablet 1  . traZODone (DESYREL) 50 MG tablet TAKE 1 TABLET BY MOUT AT BEDTIME AS NEEDED FOR SLEEP  6  . triamcinolone cream (KENALOG) 0.1 % APPLY TO AFFECTED AREA UP TO TWICE DAILY AS NEEDED (NOT TO FACE, GROIN, OR UNDERARMS)  3  . Turmeric Curcumin 500 MG CAPS Take 1 capsule by mouth daily.     No current facility-administered medications for this visit.      Past Surgical History:  Procedure Laterality Date  . ABDOMINAL HYSTERECTOMY     partial  . BREAST LUMPECTOMY Left 12/2011  . CHOLECYSTECTOMY    . COLONOSCOPY N/A 07/01/2013   Procedure: COLONOSCOPY;  Surgeon: Rogene Houston, MD;  Location: [facility];  Service: Endoscopy;  Laterality: N/A;  1030  . TUBAL LIGATION       Allergies  Allergen Reactions  . Hydrocodone   . Tramadol     Hallucinations, abd pain      Family History  Problem Relation Age of Onset  . Cancer Mother 52       colon cancer  . Cancer Father 75       throat cancer  . Pancreatic cancer Brother        [phone] 11-14-15     Social History Lisa Torres reports that she quit smoking about 19 years ago. Her smoking use included cigarettes. She has a 9.00 pack-year smoking history. She has never used smokeless tobacco. Lisa Torres reports that she does not drink  alcohol.   Review of Systems CONSTITUTIONAL: No weight loss, fever, chills, weakness or fatigue.  HEENT: Eyes: No visual loss, blurred vision, double vision or yellow sclerae.No hearing loss, sneezing, congestion, runny nose or sore throat.  SKIN: No rash or itching.  CARDIOVASCULAR: per hpi RESPIRATORY: No shortness of breath, cough or sputum.  GASTROINTESTINAL: per hpi  GENITOURINARY: No burning on urination, no polyuria NEUROLOGICAL: No headache, dizziness, syncope, paralysis, ataxia, numbness or tingling in the extremities. No change in bowel or bladder control.  MUSCULOSKELETAL: No muscle, back pain, joint pain or stiffness.  LYMPHATICS: No enlarged nodes. No history of splenectomy.  PSYCHIATRIC: No history of depression or anxiety.  ENDOCRINOLOGIC: No reports of sweating, cold or heat intolerance. No polyuria or polydipsia.  Marland Kitchen   Physical Examination Vitals:   10/21/17 1328  BP: 128/78  Pulse: 80  SpO2: 97%   Vitals:   10/21/17 1328  Height: 5' 1.5" (1.562 m)    Gen: resting comfortably, no acute distress HEENT: no scleral icterus, pupils equal round and reactive, no palptable cervical adenopathy,  CV: RRR, no m/r/g, no jvd Resp: Clear to auscultation bilaterally GI: abdomen is soft, non-tender, non-distended, normal bowel sounds, no hepatosplenomegaly MSK: extremities are warm, no edema.  Skin: warm, no rash Neuro:  no focal deficits Psych: appropriate affect      Assessment and Plan   1. Chest pain - symptoms not consistent with cardiac etiology. Negative lexiscan 12/2016. No plans for repeat ischemic testing at this time. Etiology most suggestive of GI etiology. Refer to GI.   2. HTN - at goal, continue current meds   Arnoldo Lenis, M.D.

## 2017-10-27 ENCOUNTER — Encounter: Payer: Self-pay | Admitting: Cardiology

## 2017-11-04 ENCOUNTER — Encounter (INDEPENDENT_AMBULATORY_CARE_PROVIDER_SITE_OTHER): Payer: Self-pay | Admitting: *Deleted

## 2017-11-04 ENCOUNTER — Encounter (INDEPENDENT_AMBULATORY_CARE_PROVIDER_SITE_OTHER): Payer: Self-pay | Admitting: Internal Medicine

## 2017-11-04 ENCOUNTER — Ambulatory Visit (INDEPENDENT_AMBULATORY_CARE_PROVIDER_SITE_OTHER): Payer: Medicare Other | Admitting: Internal Medicine

## 2017-11-04 VITALS — BP 140/74 | HR 72 | Temp 97.9°F | Ht 61.5 in | Wt 150.0 lb

## 2017-11-04 DIAGNOSIS — R0789 Other chest pain: Secondary | ICD-10-CM | POA: Diagnosis not present

## 2017-11-04 DIAGNOSIS — R1013 Epigastric pain: Secondary | ICD-10-CM | POA: Diagnosis not present

## 2017-11-04 NOTE — Patient Instructions (Signed)
The risks of bleeding, perforation and infection were reviewed with patient.  

## 2017-11-04 NOTE — Progress Notes (Signed)
Subjective:    Patient ID: Lisa Torres, female    DOB: [birth year], 72 y.o.   MRN: [id number] Patient in [place]. Walks very little.  HPI Referred by Dr. Harl Bowie for atypical chest pain. Seen in ED 09/2017 for chest pain x 2. Troponin's were negative. CXR showed no acute process. EKG without ischemic changes. The pain  was in the substernal region. Pain did not radiated.  She tells me she has been having chest pain. It comes and goes. The pain will last 2-3 minutes. She has been having the chest pain since March.  The pain occurs after she eats or when she get ready to lie down.  She does have acid reflux and is covered with Protonix.  She thinks her acid reflux is causing her chest pain. Sometimes she has the pain with movement.  She c/o chest tightness. Pain get better sometimes with Maalox.  Hx of breast cancer, rt knee replacement in 2018  She does not like to eat breakfast. She has lost about 10 pounds since her knee surgery last year.  01/07/2017 NM Myocardial multi w/spect w/wall motion EF  There was no ST segment deviation noted during stress.  The study is normal. There are no perfusion defects consistent with prior infarct or current ischemia  This is a low risk study.  The left ventricular ejection fraction is low normal 50%.      Her last Colonoscopy was in 2015 for hx of adenoma and family hx of colon cancer in mother.  Impression:  Examination performed to cecum. Single small polyp ablated via cold biopsy from hepatic flexure. Small external hemorrhoids.    Review of Systems Past Medical History:  Diagnosis Date  . Breast cancer ([place]) 11/2011  . Chronic back pain   . Chronic leg pain    bilateral  . Chronic leukopenia   . DDD (degenerative disc disease), cervical   . DDD (degenerative disc disease), lumbar   . Gait abnormality   . GERD (gastroesophageal reflux disease)   . Hypertension   . Parkinson's disease ([place])   . Radiation 04/2012   33 treatments  for breast cancer  . Right knee pain   . Thyroid disease     Past Surgical History:  Procedure Laterality Date  . ABDOMINAL HYSTERECTOMY     partial  . BREAST LUMPECTOMY Left 12/2011  . CHOLECYSTECTOMY    . COLONOSCOPY N/A 07/01/2013   Procedure: COLONOSCOPY;  Surgeon: Rogene Houston, MD;  Location: [facility];  Service: Endoscopy;  Laterality: N/A;  1030  . TUBAL LIGATION      Allergies  Allergen Reactions  . Hydrocodone   . Tramadol     Hallucinations, abd pain    Current Outpatient Medications on File Prior to Visit  Medication Sig Dispense Refill  . acetaminophen (TYLENOL) 500 MG tablet Take 500 mg by mouth 3 (three) times daily as needed.    Marland Kitchen amLODipine (NORVASC) 5 MG tablet Take 5 mg by mouth every morning.     Marland Kitchen levothyroxine (SYNTHROID, LEVOTHROID) 100 MCG tablet Take 100 mcg by mouth daily before breakfast.    . Melatonin 3 MG CAPS Take 3 mg by mouth at bedtime.    . Misc Natural Products (TART CHERRY ADVANCED PO) Take 1 tablet by mouth daily.    Marland Kitchen oxycodone (OXY-IR) 5 MG capsule Take 5 mg by mouth every 4 (four) hours as needed.    . pantoprazole (PROTONIX) 20 MG tablet Take 1 tablet (20 mg  total) by mouth daily. 30 tablet 2  . traZODone (DESYREL) 50 MG tablet TAKE 1 TABLET BY MOUT AT BEDTIME AS NEEDED FOR SLEEP  6  . vitamin B-12 (CYANOCOBALAMIN) 100 MCG tablet Take by mouth daily.    Marland Kitchen triamcinolone cream (KENALOG) 0.1 % APPLY TO AFFECTED AREA UP TO TWICE DAILY AS NEEDED (NOT TO FACE, GROIN, OR UNDERARMS)  3  . Turmeric Curcumin 500 MG CAPS Take 1 capsule by mouth daily.     No current facility-administered medications on file prior to visit.         Objective:   Physical Exam. Blood pressure 140/74, pulse 72, temperature 97.9 F (36.6 C), height 5' 1.5" (1.562 m), weight 150 lb (68 kg). Alert and oriented. Skin warm and dry. Oral mucosa is moist.   . Sclera anicteric, conjunctivae is pink. Thyroid not enlarged. No cervical lymphadenopathy. Lungs clear.  Heart regular rate and rhythm.  Abdomen is soft. Bowel sounds are positive. No hepatomegaly. No abdominal masses felt Epigastric  tenderness. Tenderness to chest wall with slight pressure.   No edema to lower extremities.     Examined from [place].  Unable to ambulate.   (Weight is stated. She was unable to stand)    Assessment & Plan:  Chest pain: Cardiac origin ruled out. Will set up for an EGD.

## 2017-11-21 ENCOUNTER — Encounter (HOSPITAL_COMMUNITY): Payer: Self-pay | Admitting: *Deleted

## 2017-11-21 ENCOUNTER — Other Ambulatory Visit: Payer: Self-pay

## 2017-11-21 ENCOUNTER — Ambulatory Visit (HOSPITAL_COMMUNITY)
Admission: RE | Admit: 2017-11-21 | Discharge: 2017-11-21 | Disposition: A | Discharge status: Home / Self Care | Payer: Medicare Other | Source: Ambulatory Visit | Attending: Internal Medicine | Admitting: Internal Medicine

## 2017-11-21 ENCOUNTER — Encounter (HOSPITAL_COMMUNITY)
Admission: RE | Disposition: A | Discharge status: Home / Self Care | Payer: Self-pay | Source: Ambulatory Visit | Attending: Internal Medicine

## 2017-11-21 DIAGNOSIS — R1013 Epigastric pain: Secondary | ICD-10-CM | POA: Diagnosis not present

## 2017-11-21 DIAGNOSIS — M79605 Pain in left leg: Secondary | ICD-10-CM | POA: Diagnosis not present

## 2017-11-21 DIAGNOSIS — K317 Polyp of stomach and duodenum: Secondary | ICD-10-CM | POA: Diagnosis not present

## 2017-11-21 DIAGNOSIS — I10 Essential (primary) hypertension: Secondary | ICD-10-CM | POA: Insufficient documentation

## 2017-11-21 DIAGNOSIS — G20 Parkinson's disease: Secondary | ICD-10-CM | POA: Diagnosis not present

## 2017-11-21 DIAGNOSIS — K228 Other specified diseases of esophagus: Secondary | ICD-10-CM | POA: Insufficient documentation

## 2017-11-21 DIAGNOSIS — Z79899 Other long term (current) drug therapy: Secondary | ICD-10-CM | POA: Insufficient documentation

## 2017-11-21 DIAGNOSIS — G8929 Other chronic pain: Secondary | ICD-10-CM | POA: Diagnosis not present

## 2017-11-21 DIAGNOSIS — M79604 Pain in right leg: Secondary | ICD-10-CM | POA: Diagnosis not present

## 2017-11-21 DIAGNOSIS — Z885 Allergy status to narcotic agent status: Secondary | ICD-10-CM | POA: Diagnosis not present

## 2017-11-21 DIAGNOSIS — Z87891 Personal history of nicotine dependence: Secondary | ICD-10-CM | POA: Diagnosis not present

## 2017-11-21 DIAGNOSIS — Z888 Allergy status to other drugs, medicaments and biological substances status: Secondary | ICD-10-CM | POA: Insufficient documentation

## 2017-11-21 DIAGNOSIS — R0789 Other chest pain: Secondary | ICD-10-CM | POA: Insufficient documentation

## 2017-11-21 HISTORY — PX: ESOPHAGOGASTRODUODENOSCOPY: SHX5428

## 2017-11-21 SURGERY — EGD (ESOPHAGOGASTRODUODENOSCOPY)
Anesthesia: Moderate Sedation

## 2017-11-21 MED ORDER — MIDAZOLAM HCL 5 MG/5ML IJ SOLN
INTRAMUSCULAR | Status: DC | PRN
  Administered 2017-11-21: 2 mg via INTRAVENOUS
  Administered 2017-11-21: 1 mg via INTRAVENOUS

## 2017-11-21 MED ORDER — SODIUM CHLORIDE 0.9 % IV SOLN
INTRAVENOUS | Status: DC
  Administered 2017-11-21: 14:00:00 via INTRAVENOUS

## 2017-11-21 MED ORDER — MIDAZOLAM HCL 5 MG/5ML IJ SOLN
INTRAMUSCULAR | Status: AC
  Filled 2017-11-21: qty 10

## 2017-11-21 MED ORDER — MEPERIDINE HCL 50 MG/ML IJ SOLN
INTRAMUSCULAR | Status: AC
  Filled 2017-11-21: qty 1

## 2017-11-21 MED ORDER — LIDOCAINE VISCOUS HCL 2 % MT SOLN
OROMUCOSAL | Status: DC | PRN
  Administered 2017-11-21: 1 via OROMUCOSAL

## 2017-11-21 MED ORDER — MEPERIDINE HCL 50 MG/ML IJ SOLN
INTRAMUSCULAR | Status: DC | PRN
  Administered 2017-11-21 (×2): 25 mg via INTRAVENOUS

## 2017-11-21 MED ORDER — PANTOPRAZOLE SODIUM 40 MG PO TBEC: 40.0000 mg | DELAYED_RELEASE_TABLET | Freq: Every day | ORAL | 5 refills | Status: DC

## 2017-11-21 NOTE — H&P (Signed)
Lisa Torres is an 72 y.o. female.   Chief Complaint: Patient is here for EGD. HPI: Patient is 72 year old American female who presents with over 19-month history of intermittent chest pain described as tightness.  Denies heartburn.  She also complains of midepigastric pain with these 2 pains do not occur simultaneously.  Sometimes pain is noticeable after meals at the time it is not.  She does not have a good appetite.  She states she is lost 10 pounds the last few months.  She has difficulty walking.  She had knee replacement last year and still having pain.  She takes Aleve and or Advil on as-needed basis but less than twice a week. CBC, LFTs and serum lipase normal about 6 weeks ago when she was seen in emergency room for chest pain. He has been on pantoprazole for about 6 weeks and cannot tell any difference.  Past Medical History:  Diagnosis Date  . Breast cancer ([place]) 11/2011  . Chronic back pain   . Chronic leg pain    bilateral  . Chronic leukopenia   . DDD (degenerative disc disease), cervical   . DDD (degenerative disc disease), lumbar   . Gait abnormality   . GERD (gastroesophageal reflux disease)   . Hypertension   . Parkinson's disease ([place])   . Radiation 04/2012   33 treatments for breast cancer  . Right knee pain   . Thyroid disease     Past Surgical History:  Procedure Laterality Date  . ABDOMINAL HYSTERECTOMY     partial  . BREAST LUMPECTOMY Left 12/2011  . CHOLECYSTECTOMY    . COLONOSCOPY N/A 07/01/2013   Procedure: COLONOSCOPY;  Surgeon: Rogene Houston, MD;  [facility];  Service: Endoscopy;  Laterality: N/A;  1030  . TUBAL LIGATION      Family History  Problem Relation Age of Onset  . Cancer Mother 78       colon cancer  . Cancer Father 75       throat cancer  . Pancreatic cancer Brother        [id number] 11-14-15   Social History:  reports that she quit smoking about 19 years ago. Her smoking use included cigarettes. She has a 9.00 pack-year  smoking history. She has never used smokeless tobacco. She reports that she does not drink alcohol or use drugs.  Allergies:  Allergies  Allergen Reactions  . Hydrocodone Other (See Comments)    Intolerance  . Tramadol Other (See Comments)    Hallucinations, abd pain    Medications Prior to Admission  Medication Sig Dispense Refill  . acetaminophen (TYLENOL) 500 MG tablet Take 1,000 mg by mouth 3 (three) times daily as needed for moderate pain or headache.     Marland Kitchen amLODipine (NORVASC) 5 MG tablet Take 5 mg by mouth every morning.     Marland Kitchen b complex vitamins tablet Take 1 tablet by mouth daily.    Marland Kitchen ibuprofen (ADVIL,MOTRIN) 200 MG tablet Take 400 mg by mouth every 6 (six) hours as needed for headache or moderate pain.    Marland Kitchen levothyroxine (SYNTHROID, LEVOTHROID) 100 MCG tablet Take 100 mcg by mouth daily before breakfast.    . Melatonin 3 MG CAPS Take 3 mg by mouth at bedtime.    . pantoprazole (PROTONIX) 20 MG tablet Take 1 tablet (20 mg total) by mouth daily. 30 tablet 2  . traZODone (DESYREL) 50 MG tablet TAKE 1 TABLET BY MOUT AT BEDTIME AS NEEDED FOR SLEEP  6  .  triamcinolone cream (KENALOG) 0.1 % APPLY TO AFFECTED AREA UP TO TWICE DAILY AS NEEDED (NOT TO FACE, GROIN, OR UNDERARMS)  3    No results found for this or any previous visit (from the past 48 hour(s)). No results found.  ROS  Blood pressure (!) 165/82, pulse 79, temperature 98.6 F (37 C), temperature source Oral, resp. rate 18, SpO2 100 %. Physical Exam  Constitutional: She appears well-developed and well-nourished.  HENT:  Mouth/Throat: Oropharynx is clear and moist.  Eyes: Conjunctivae are normal. No scleral icterus.  Neck: No thyromegaly present.  Cardiovascular: Normal rate, regular rhythm and normal heart sounds.  No murmur heard. Respiratory: Effort normal and breath sounds normal.  GI:  Abdomen is symmetrical.  It is soft with mild tenderness and left lower quadrant, left upper quadrant and right upper quadrant.   Tenderness is more pronounced in epigastric region.  No organomegaly or masses.  Musculoskeletal: She exhibits no edema.  Lymphadenopathy:    She has no cervical adenopathy.  Neurological: She is alert.  Skin: Skin is warm and dry.     Assessment/Plan Noncardiac chest pain and epigastric pain. No sick EGD.  Hildred Laser, MD 11/21/2017, 2:58 PM

## 2017-11-21 NOTE — Op Note (Signed)
[facility] Patient Name: Lisa Torres Procedure Date: 11/21/2017 2:36 PM MRN: [id number] Date of Birth: Sep 21, 1945 Attending MD: Hildred Laser , MD CSN: [id number] Age: 72 Admit Type: Outpatient Procedure:                Upper GI endoscopy Indications:              Epigastric abdominal pain, Chest pain (non cardiac) Providers:                Hildred Laser, MD, Lurline Del, RN, Aram Candela Referring MD:             Arnoldo Lenis MD and [name], MD Medicines:                Lidocaine spray, Meperidine 50 mg IV, Midazolam 3                            mg IV Complications:            No immediate complications. Estimated Blood Loss:     Estimated blood loss: none. Procedure:                Pre-Anesthesia Assessment:                           - Prior to the procedure, a History and Physical                            was performed, and patient medications and                            allergies were reviewed. The patient's tolerance of                            previous anesthesia was also reviewed. The risks                            and benefits of the procedure and the sedation                            options and risks were discussed with the patient.                            All questions were answered, and informed consent                            was obtained. Prior Anticoagulants: The patient                            last took ibuprofen 14 days prior to the procedure.                            ASA Grade Assessment: III - A patient with severe                            systemic disease. After reviewing the risks and  benefits, the patient was deemed in satisfactory                            condition to undergo the procedure.                           After obtaining informed consent, the endoscope was                            passed under direct vision. Throughout the                            procedure, the patient's  blood pressure, pulse, and                            oxygen saturations were monitored continuously. The                            EG-[id number] ([id number]) scope was introduced through the                            mouth, and advanced to the second part of duodenum.                            The upper GI endoscopy was accomplished without                            difficulty. The patient tolerated the procedure                            well. Scope In: 3:14:59 PM Scope Out: 3:20:16 PM Total Procedure Duration: 0 hours 5 minutes 17 seconds  Findings:      The examined esophagus was normal.      The Z-line was irregular and was found 38 cm from the incisors.      Three diminutive sessile polyps were found in the gastric fundus.      The exam of the stomach was otherwise normal.      The duodenal bulb and second portion of the duodenum were normal. Impression:               - Normal esophagus.                           - Z-line irregular, 38 cm from the incisors.                           - Three dimunitive gastric polyps. These were left                            alone.                           - Normal duodenal bulb and second portion of the  duodenum.                           - No specimens collected.                           Comment: No abnormality to explain patient,s                            symptoms. Moderate Sedation:      Moderate (conscious) sedation was administered by the endoscopy nurse       and supervised by the endoscopist. The following parameters were       monitored: oxygen saturation, heart rate, blood pressure, CO2       capnography and response to care. Total physician intraservice time was       13 minutes. Recommendation:           - Patient has a contact number available for                            emergencies. The signs and symptoms of potential                            delayed complications were discussed with the                             patient. Return to normal activities tomorrow.                            Written discharge instructions were provided to the                            patient.                           - Resume previous diet today.                           - Continue present medications.                           - Increase Pantoprazole to 40 mg po qam.                           - Keep symptom diary until OV in 4 to 6 weeks. Procedure Code(s):        --- Professional ---                           [phone], Esophagogastroduodenoscopy, flexible,                            transoral; diagnostic, including collection of                            specimen(s) by brushing or washing, when performed                            (  separate procedure)                           G0500, Moderate sedation services provided by the                            same physician or other qualified health care                            professional performing a gastrointestinal                            endoscopic service that sedation supports,                            requiring the presence of an independent trained                            observer to assist in the monitoring of the                            patient's level of consciousness and physiological                            status; initial 15 minutes of intra-service time;                            patient age 58 years or older (additional time may                            be reported with [phone], as appropriate) Diagnosis Code(s):        --- Professional ---                           K22.8, Other specified diseases of esophagus                           K31.7, Polyp of stomach and duodenum                           R10.13, Epigastric pain                           R07.89, Other chest pain CPT copyright 2017 American Medical Association. All rights reserved. The codes documented in this report are preliminary and upon coder review may  be  revised to meet current compliance requirements. Hildred Laser, MD Hildred Laser, MD 11/21/2017 3:33:29 PM This report has been signed electronically. Number of Addenda: 0

## 2017-11-21 NOTE — Discharge Instructions (Signed)
Discontinue pantoprazole 20 mg. Begin pantoprazole 40 mg by mouth 30 minutes before breakfast daily. Resume other medications as before.  Keep ibuprofen use to minimum. Resume usual diet. Keep symptom diary as to frequency of epigastric and chest pain for the next 4 weeks. Office visit in 4 to 6 weeks.   Upper Endoscopy, Care After Refer to this sheet in the next few weeks. These instructions provide you with information about caring for yourself after your procedure. Your health care provider may also give you more specific instructions. Your treatment has been planned according to current medical practices, but problems sometimes occur. Call your health care provider if you have any problems or questions after your procedure. What can I expect after the procedure? After the procedure, it is common to have:  A sore throat.  Bloating.  Nausea.  Follow these instructions at home:  Follow instructions from your health care provider about what to eat or drink after your procedure.  Return to your normal activities as told by your health care provider. Ask your health care provider what activities are safe for you.  Take over-the-counter and prescription medicines only as told by your health care provider.  Do not drive for 24 hours if you received a sedative.  Keep all follow-up visits as told by your health care provider. This is important. Contact a health care provider if:  You have a sore throat that lasts longer than one day.  You have trouble swallowing. Get help right away if:  You have a fever.  You vomit blood or your vomit looks like coffee grounds.  You have bloody, black, or tarry stools.  You have a severe sore throat or you cannot swallow.  You have difficulty breathing.  You have severe pain in your chest or belly. This information is not intended to replace advice given to you by your health care provider. Make sure you discuss any questions you have with  your health care provider. Document Released: 12/03/2011 Document Revised: 11/09/2015 Document Reviewed: 03/16/2015 Elsevier Interactive Patient Education  [employer].

## 2017-11-24 ENCOUNTER — Telehealth (INDEPENDENT_AMBULATORY_CARE_PROVIDER_SITE_OTHER): Payer: Self-pay | Admitting: Internal Medicine

## 2017-11-24 ENCOUNTER — Telehealth (INDEPENDENT_AMBULATORY_CARE_PROVIDER_SITE_OTHER): Payer: Self-pay | Admitting: *Deleted

## 2017-11-24 NOTE — Telephone Encounter (Signed)
Message was rec'd earlier, this will be addressed with Dr.Rehman 11/25/2017  And the patient will be made aware.

## 2017-11-24 NOTE — Telephone Encounter (Signed)
Patient called stated she needs a note stating she can go back to therapy - her ph# [phone]

## 2017-11-24 NOTE — Telephone Encounter (Signed)
Will review with Dr.Rehman.

## 2017-11-24 NOTE — Telephone Encounter (Signed)
Patient states she needs a note from Dr Laural Golden stating she can start back on PT since she had her procedure

## 2017-11-26 ENCOUNTER — Encounter (INDEPENDENT_AMBULATORY_CARE_PROVIDER_SITE_OTHER): Payer: Self-pay | Admitting: *Deleted

## 2017-11-26 NOTE — Telephone Encounter (Signed)
Patient called and so has [facility] , this is where patient gets therapy. They are requesting a note that the patient may resume PT after having had her procedure 11/21/2017.  Per Dr.Rehman patient may resume PT. [facility] needs a noted stating this.  Their fax number is [phone].

## 2017-11-26 NOTE — Telephone Encounter (Signed)
Notes has been faxed.

## 2017-11-26 NOTE — Telephone Encounter (Signed)
Dr.Rehman states that the patient may resume PT. A note will need to be faxed to [facility] @ [phone], stating this. A note has been sent to staff for this to be done.

## 2017-11-28 ENCOUNTER — Encounter (HOSPITAL_COMMUNITY): Payer: Self-pay | Admitting: Internal Medicine

## 2018-01-02 NOTE — Pre-Procedure Instructions (Signed)
Lisa Torres  01/02/2018      [id number] [place] [place] [address] [place] [address] Phone: [phone] Fax: [phone]  [id number] - [place], [address] [place] [address] [place] [address] Phone: [phone] Fax: [phone]    Your procedure is scheduled on Monday, July 29th   Report to [facility] Admitting at 1:30 PM             (posted surgery time 3:25p - 5:57p)   Call this number if you have problems the morning of surgery:  [phone]   Remember:   Do not eat any food or drink any liquids after midnight, Sunday.             7 days prior to surgery, STOP TAKING any vitamins, herbal supplements, anti-inflammatories.   Take these medicines the morning of surgery with A SIP OF WATER : Amlodipine, Levothyroxine, Pantoprazole.    Do not wear jewelry, make-up or nail polish.  Do not wear lotions, powders,  perfumes, or deodorant.  Do not shave 48 hours prior to surgery.    Do not bring valuables to the hospital.  [facility] is not responsible for any belongings or valuables.  Contacts, dentures or bridgework may not be worn into surgery.  Leave your suitcase in the car.  After surgery it may be brought to your room.  For patients admitted to the hospital, discharge time will be determined by your treatment team.  Please read over the following fact sheets that you were given. Pain Booklet, Coughing and Deep Breathing, MRSA Information and Surgical Site Infection Prevention       [facility]- Preparing For Surgery  Before surgery, you can play an important role. Because skin is not sterile, your skin needs to be as free of germs as possible. You can reduce the number of germs on your skin by washing with CHG (chlorahexidine gluconate) Soap before surgery.  CHG is an antiseptic cleaner which kills germs and bonds with the skin to  continue killing germs even after washing.    Oral Hygiene is also important to reduce your risk of infection.    Remember - BRUSH YOUR TEETH THE MORNING OF SURGERY WITH YOUR REGULAR TOOTHPASTE  Please do not use if you have an allergy to CHG or antibacterial soaps. If your skin becomes reddened/irritated stop using the CHG.  Do not shave (including legs and underarms) for at least 48 hours prior to first CHG shower. It is OK to shave your face.  Please follow these instructions carefully.   1. Shower the NIGHT BEFORE SURGERY and the MORNING OF SURGERY with CHG.   2. If you chose to wash your hair, wash your hair first as usual with your normal shampoo.  3. After you shampoo, rinse your hair and body thoroughly to remove the shampoo.  4. Use CHG as you would any other liquid soap. You can apply CHG directly to the skin and wash gently with a scrungie or a clean washcloth.   5. Apply the CHG Soap to your body ONLY FROM THE NECK DOWN.  Do not use on open wounds or open sores. Avoid contact with your eyes, ears, mouth and genitals (private parts). Wash Face and genitals (private parts)  with your normal soap.  6. Wash thoroughly, paying special attention to the area where your surgery  will be performed.  7. Thoroughly rinse your body with warm water from the neck down.  8. DO NOT shower/wash with your normal soap after using and rinsing off the CHG Soap.  9. Pat yourself dry with a CLEAN TOWEL.  10. Wear CLEAN PAJAMAS to bed the night before surgery, wear comfortable clothes the morning of surgery  11. Place CLEAN SHEETS on your bed the night of your first shower and DO NOT SLEEP WITH PETS.    Day of Surgery:  Do not apply any deodorants/lotions.  Please wear clean clothes to the hospital/surgery center.    Remember to brush your teeth WITH YOUR REGULAR TOOTHPASTE.

## 2018-01-05 ENCOUNTER — Encounter (HOSPITAL_COMMUNITY): Payer: Self-pay

## 2018-01-05 ENCOUNTER — Encounter (HOSPITAL_COMMUNITY)
Admission: RE | Admit: 2018-01-05 | Discharge: 2018-01-05 | Disposition: A | Discharge status: Home / Self Care | Payer: Medicare Other | Source: Ambulatory Visit | Attending: Orthopedic Surgery | Admitting: Orthopedic Surgery

## 2018-01-05 ENCOUNTER — Other Ambulatory Visit: Payer: Self-pay

## 2018-01-05 ENCOUNTER — Other Ambulatory Visit: Payer: Self-pay | Admitting: Orthopedic Surgery

## 2018-01-05 DIAGNOSIS — Z01818 Encounter for other preprocedural examination: Secondary | ICD-10-CM | POA: Diagnosis present

## 2018-01-05 DIAGNOSIS — Y838 Other surgical procedures as the cause of abnormal reaction of the patient, or of later complication, without mention of misadventure at the time of the procedure: Secondary | ICD-10-CM | POA: Diagnosis not present

## 2018-01-05 DIAGNOSIS — T8453XA Infection and inflammatory reaction due to internal right knee prosthesis, initial encounter: Secondary | ICD-10-CM | POA: Diagnosis not present

## 2018-01-05 LAB — SURGICAL PCR SCREEN
MRSA, PCR: NEGATIVE
Staphylococcus aureus: NEGATIVE

## 2018-01-05 LAB — CBC WITH DIFFERENTIAL/PLATELET
Abs Immature Granulocytes: 0 10*3/uL (ref 0.0–0.1)
Basophils Absolute: 0 10*3/uL (ref 0.0–0.1)
Basophils Relative: 1 %
EOS PCT: 1 %
Eosinophils Absolute: 0 10*3/uL (ref 0.0–0.7)
HEMATOCRIT: 37.1 % (ref 36.0–46.0)
Hemoglobin: 11.8 g/dL — ABNORMAL LOW (ref 12.0–15.0)
IMMATURE GRANULOCYTES: 0 %
LYMPHS ABS: 0.7 10*3/uL (ref 0.7–4.0)
LYMPHS PCT: 17 %
MCH: 28 pg (ref 26.0–34.0)
MCHC: 31.8 g/dL (ref 30.0–36.0)
MCV: 88.1 fL (ref 78.0–100.0)
MONO ABS: 0.2 10*3/uL (ref 0.1–1.0)
MONOS PCT: 6 %
NEUTROS ABS: 2.8 10*3/uL (ref 1.7–7.7)
Neutrophils Relative %: 75 %
PLATELETS: 293 10*3/uL (ref 150–400)
RBC: 4.21 MIL/uL (ref 3.87–5.11)
RDW: 12.4 % (ref 11.5–15.5)
WBC: 3.8 10*3/uL — ABNORMAL LOW (ref 4.0–10.5)

## 2018-01-05 LAB — COMPREHENSIVE METABOLIC PANEL
ALBUMIN: 3.7 g/dL (ref 3.5–5.0)
ALT: 11 U/L (ref 0–44)
ANION GAP: 8 (ref 5–15)
AST: 18 U/L (ref 15–41)
Alkaline Phosphatase: 101 U/L (ref 38–126)
BILIRUBIN TOTAL: 0.5 mg/dL (ref 0.3–1.2)
BUN: 6 mg/dL — ABNORMAL LOW (ref 8–23)
CHLORIDE: 107 mmol/L (ref 98–111)
CO2: 24 mmol/L (ref 22–32)
Calcium: 9.1 mg/dL (ref 8.9–10.3)
Creatinine, Ser: 0.65 mg/dL (ref 0.44–1.00)
GFR calc Af Amer: >60 mL/min (ref >60)
GFR calc non Af Amer: >60 mL/min (ref >60)
Glucose, Bld: 96 mg/dL (ref 70–99)
POTASSIUM: 3.6 mmol/L (ref 3.5–5.1)
SODIUM: 139 mmol/L (ref 135–145)
TOTAL PROTEIN: 8.1 g/dL (ref 6.5–8.1)

## 2018-01-05 NOTE — Progress Notes (Addendum)
PCP: Consuello Masse, MD  Cardiologist: Carlyle Dolly, MD  EKG: 10/13/17 in [facility]  Stress test: 01/07/17  ECHO: pt denies  Cardiac Cath: pt denies  Chest x-ray: 10/11/17 in [facility]

## 2018-01-06 IMAGING — CR DG CHEST 1V PORT
1 series · 1 of 1 positions shown · non-contrast
Comparison: 07/10/2016

CLINICAL DATA: Central chest and epigastric pain.

EXAM:
PORTABLE CHEST 1 VIEW

[ap]
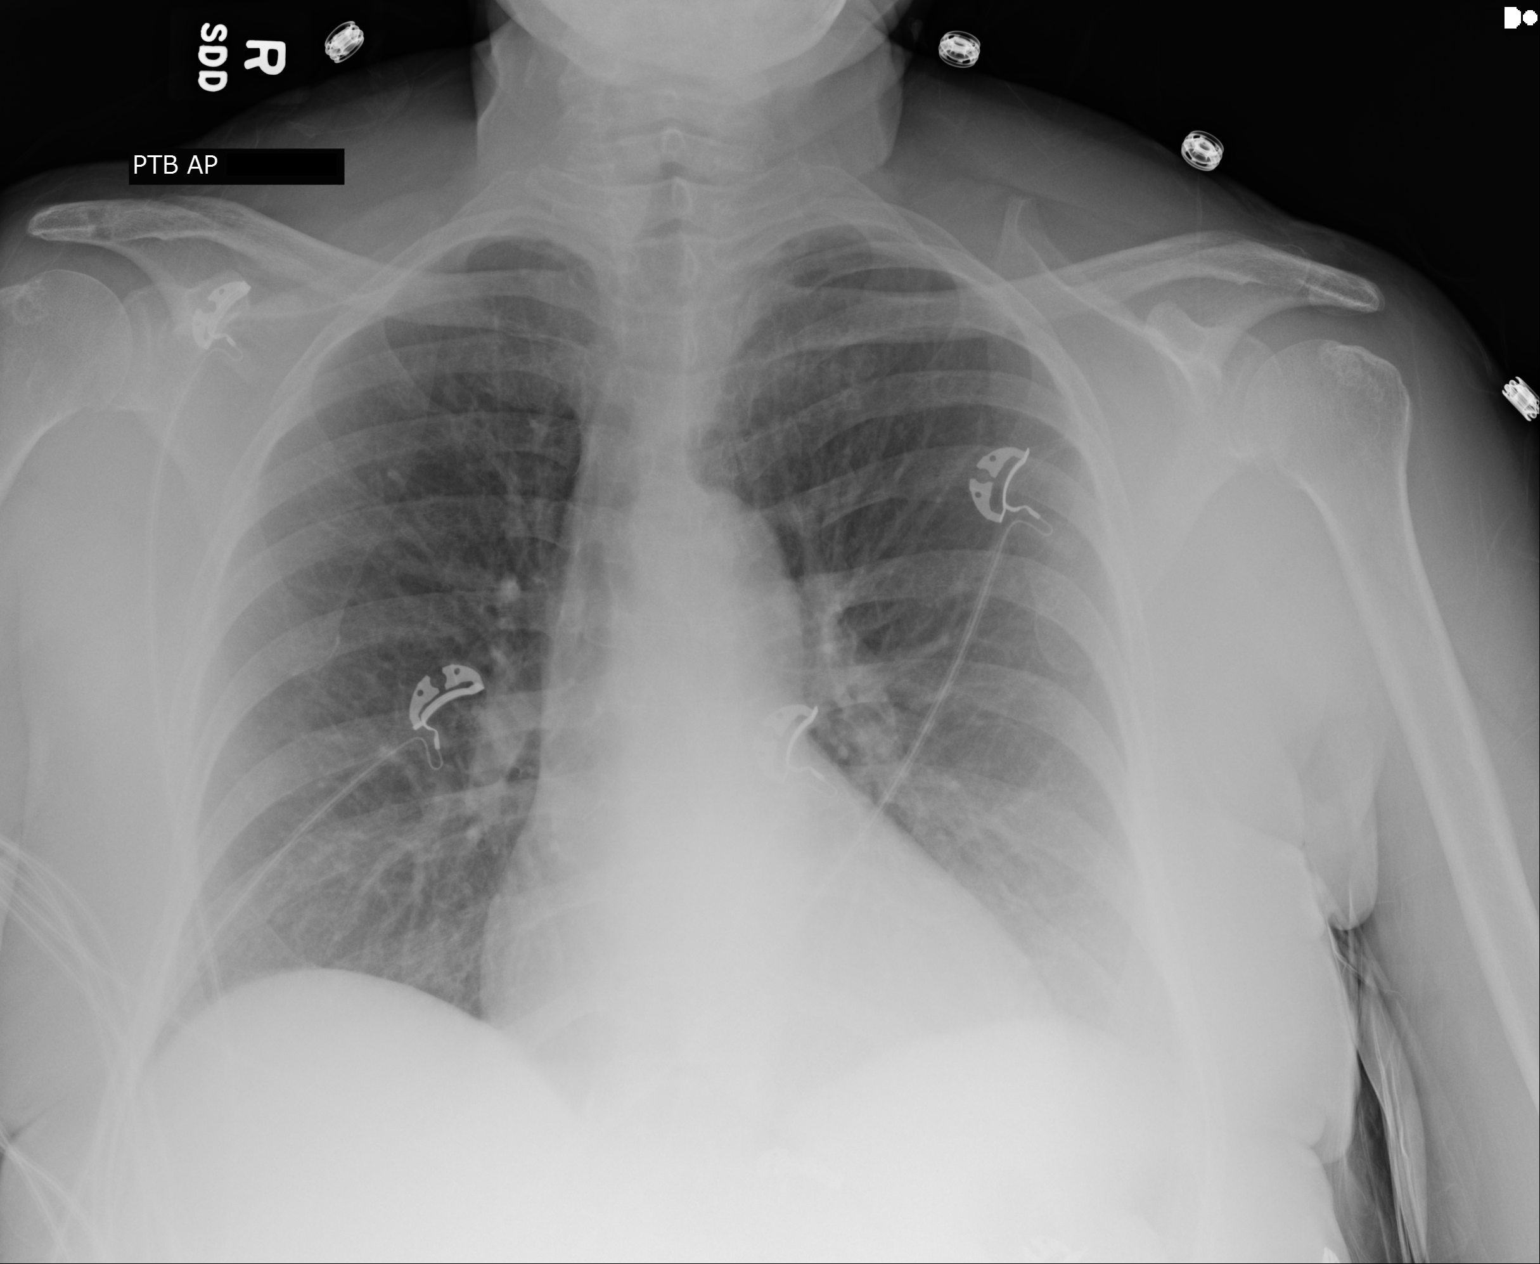

[1 of 1 positions shown; findings below may reference images not displayed]

FINDINGS: 4171 hours. The lungs are clear without focal pneumonia, edema,
pneumothorax or pleural effusion. The cardiopericardial silhouette
is within normal limits for size. The visualized bony structures of
the thorax are intact. Telemetry leads overlie the chest.
IMPRESSION: No active disease.

## 2018-01-07 ENCOUNTER — Ambulatory Visit (INDEPENDENT_AMBULATORY_CARE_PROVIDER_SITE_OTHER): Payer: Medicare Other | Admitting: Internal Medicine

## 2018-01-07 ENCOUNTER — Encounter (INDEPENDENT_AMBULATORY_CARE_PROVIDER_SITE_OTHER): Payer: Self-pay | Admitting: Internal Medicine

## 2018-01-07 VITALS — BP 150/90 | HR 68 | Temp 97.8°F | Ht 61.5 in | Wt 150.0 lb

## 2018-01-07 DIAGNOSIS — R0789 Other chest pain: Secondary | ICD-10-CM | POA: Diagnosis not present

## 2018-01-07 NOTE — Progress Notes (Signed)
Subjective:    Patient ID: Lisa Torres, female    DOB: 06-01-1946, 72 y.o.   MRN: [id number] Patient is w/c bound. Weight is stated.   PCP Dr. Quintin Alto HPI Here today for f/u. Underwent and EGD in June for chest and epigastric pain. EGD normal except for 3 gastric polyps which were left alone.  Maintained on Protonix 40mg  daily. She tells me she is doing pretty good. Sometimes she has some trouble swallowing, but otherwise she is okay. She has a BM two to three times a weeks.  Her appetite is not good.     Her last Colonoscopy was in 2015 for hx of adenoma and family hx of colon cancer in mother.  Impression:  Examination performed to cecum. Single small polyp ablated via cold biopsy from hepatic flexure. Small external hemorrhoids.     Review of Systems Past Medical History:  Diagnosis Date  . Breast cancer ([place]) 11/2011  . Chronic back pain   . Chronic leg pain    bilateral  . Chronic leukopenia   . DDD (degenerative disc disease), cervical   . DDD (degenerative disc disease), lumbar   . Gait abnormality   . GERD (gastroesophageal reflux disease)   . Hypertension   . Parkinson's disease ([place])   . Radiation 04/2012   33 treatments for breast cancer  . Right knee pain   . Thyroid disease     Past Surgical History:  Procedure Laterality Date  . ABDOMINAL HYSTERECTOMY     partial  . BREAST LUMPECTOMY Left 12/2011  . CHOLECYSTECTOMY    . COLONOSCOPY N/A 07/01/2013   Procedure: COLONOSCOPY;  Surgeon: Rogene Houston, MD;  [facility];  Service: Endoscopy;  Laterality: N/A;  1030  . ESOPHAGOGASTRODUODENOSCOPY N/A 11/21/2017   Procedure: ESOPHAGOGASTRODUODENOSCOPY (EGD);  Surgeon: Rogene Houston, MD;  [facility];  Service: Endoscopy;  Laterality: N/A;  2:45  . JOINT REPLACEMENT  01/2017   right knee  . TUBAL LIGATION      Allergies  Allergen Reactions  . Hydrocodone Other (See Comments)    Heart pain   . Tramadol Other (See  Comments)    Hallucinations, abd pain    Current Outpatient Medications on File Prior to Visit  Medication Sig Dispense Refill  . acetaminophen (TYLENOL) 500 MG tablet Take 1,000 mg by mouth 3 (three) times daily as needed for moderate pain or headache.     Marland Kitchen alum & mag hydroxide-simeth (MAALOX/MYLANTA) 200-200-20 MG/5ML suspension Take 15 mLs by mouth every 6 (six) hours as needed for indigestion or heartburn.    Marland Kitchen amLODipine (NORVASC) 5 MG tablet Take 5 mg by mouth every morning.     Marland Kitchen ibuprofen (ADVIL,MOTRIN) 200 MG tablet Take 400 mg by mouth every 6 (six) hours as needed for headache or moderate pain.    Marland Kitchen levothyroxine (SYNTHROID, LEVOTHROID) 100 MCG tablet Take 100 mcg by mouth daily before breakfast.    . Melatonin 3 MG CAPS Take 3 mg by mouth as needed.     . pantoprazole (PROTONIX) 40 MG tablet Take 1 tablet (40 mg total) by mouth daily before breakfast. 30 tablet 5  . traZODone (DESYREL) 50 MG tablet Take 50 mg by mouth at bedtime  6  . triamcinolone cream (KENALOG) 0.1 % APPLY TO AFFECTED AREA UP TO TWICE DAILY AS NEEDED FOR A RASH  (NOT TO FACE, GROIN, OR UNDERARMS)  3   No current facility-administered medications on file prior to visit.  Objective:   Physical Exam Blood pressure (!) 150/90, pulse 68, temperature 97.8 F (36.6 C), height 5' 1.5" (1.562 m), weight 150 lb (68 kg). Alert and oriented. Skin warm and dry. Oral mucosa is moist.   . Sclera anicteric, conjunctivae is pink. Thyroid not enlarged. No cervical lymphadenopathy. Lungs clear. Heart regular rate and rhythm.  Abdomen is soft. Bowel sounds are positive. No hepatomegaly. No abdominal masses felt. No tenderness.  No edema to lower extremities.           Assessment & Plan:  Chest pain. States has not had any chest pain recently. Sometimes she has tightness. She will continue the Protonix. OV in 1 year.

## 2018-01-07 NOTE — Patient Instructions (Signed)
Continue the Protonix. OV in 1 year.  

## 2018-01-10 MED ORDER — CEFAZOLIN SODIUM-DEXTROSE 2-4 GM/100ML-% IV SOLN
2.0000 g | INTRAVENOUS | Status: AC
  Administered 2018-01-12: 2 g via INTRAVENOUS
  Filled 2018-01-10: qty 100

## 2018-01-10 MED ORDER — TRANEXAMIC ACID 1000 MG/10ML IV SOLN
1000.0000 mg | INTRAVENOUS | Status: AC
  Administered 2018-01-12: 1000 mg via INTRAVENOUS
  Filled 2018-01-10: qty 1100

## 2018-01-10 MED ORDER — GABAPENTIN 300 MG PO CAPS
300.0000 mg | ORAL_CAPSULE | Freq: Once | ORAL | Status: AC
  Administered 2018-01-12: 300 mg via ORAL
  Filled 2018-01-10: qty 1

## 2018-01-10 MED ORDER — DEXAMETHASONE SODIUM PHOSPHATE 10 MG/ML IJ SOLN
8.0000 mg | Freq: Once | INTRAMUSCULAR | Status: DC
  Filled 2018-01-10: qty 1

## 2018-01-10 MED ORDER — ACETAMINOPHEN 500 MG PO TABS
1000.0000 mg | ORAL_TABLET | Freq: Once | ORAL | Status: AC
  Administered 2018-01-12: 1000 mg via ORAL
  Filled 2018-01-10: qty 2

## 2018-01-12 ENCOUNTER — Inpatient Hospital Stay (HOSPITAL_COMMUNITY): Payer: Medicare Other | Admitting: Certified Registered Nurse Anesthetist

## 2018-01-12 ENCOUNTER — Encounter (HOSPITAL_COMMUNITY): Payer: Self-pay | Admitting: Surgery

## 2018-01-12 ENCOUNTER — Inpatient Hospital Stay (HOSPITAL_COMMUNITY)
Admission: RE | Admit: 2018-01-12 | Discharge: 2018-01-16 | DRG: 468 | Disposition: A | Discharge status: Skilled Nursing Facility | Payer: Medicare Other | Attending: Orthopedic Surgery | Admitting: Orthopedic Surgery

## 2018-01-12 ENCOUNTER — Other Ambulatory Visit: Payer: Self-pay

## 2018-01-12 ENCOUNTER — Encounter (HOSPITAL_COMMUNITY)
Admission: RE | Disposition: A | Discharge status: Skilled Nursing Facility | Payer: Self-pay | Source: Ambulatory Visit | Attending: Orthopedic Surgery

## 2018-01-12 DIAGNOSIS — Z79890 Hormone replacement therapy (postmenopausal): Secondary | ICD-10-CM | POA: Diagnosis not present

## 2018-01-12 DIAGNOSIS — T8454XD Infection and inflammatory reaction due to internal left knee prosthesis, subsequent encounter: Secondary | ICD-10-CM | POA: Diagnosis not present

## 2018-01-12 DIAGNOSIS — Z96659 Presence of unspecified artificial knee joint: Secondary | ICD-10-CM

## 2018-01-12 DIAGNOSIS — T84032A Mechanical loosening of internal right knee prosthetic joint, initial encounter: Secondary | ICD-10-CM | POA: Diagnosis present

## 2018-01-12 DIAGNOSIS — Z79899 Other long term (current) drug therapy: Secondary | ICD-10-CM | POA: Diagnosis not present

## 2018-01-12 DIAGNOSIS — Z5181 Encounter for therapeutic drug level monitoring: Secondary | ICD-10-CM | POA: Diagnosis not present

## 2018-01-12 DIAGNOSIS — G20 Parkinson's disease: Secondary | ICD-10-CM | POA: Diagnosis present

## 2018-01-12 DIAGNOSIS — Z853 Personal history of malignant neoplasm of breast: Secondary | ICD-10-CM | POA: Diagnosis not present

## 2018-01-12 DIAGNOSIS — I10 Essential (primary) hypertension: Secondary | ICD-10-CM | POA: Diagnosis present

## 2018-01-12 DIAGNOSIS — Z90710 Acquired absence of both cervix and uterus: Secondary | ICD-10-CM | POA: Diagnosis not present

## 2018-01-12 DIAGNOSIS — T8450XD Infection and inflammatory reaction due to unspecified internal joint prosthesis, subsequent encounter: Secondary | ICD-10-CM | POA: Diagnosis not present

## 2018-01-12 DIAGNOSIS — Z95828 Presence of other vascular implants and grafts: Secondary | ICD-10-CM | POA: Diagnosis not present

## 2018-01-12 DIAGNOSIS — Z87891 Personal history of nicotine dependence: Secondary | ICD-10-CM | POA: Diagnosis not present

## 2018-01-12 DIAGNOSIS — Y792 Prosthetic and other implants, materials and accessory orthopedic devices associated with adverse incidents: Secondary | ICD-10-CM | POA: Diagnosis present

## 2018-01-12 DIAGNOSIS — E039 Hypothyroidism, unspecified: Secondary | ICD-10-CM | POA: Diagnosis present

## 2018-01-12 DIAGNOSIS — M25561 Pain in right knee: Secondary | ICD-10-CM | POA: Diagnosis present

## 2018-01-12 DIAGNOSIS — K219 Gastro-esophageal reflux disease without esophagitis: Secondary | ICD-10-CM | POA: Diagnosis present

## 2018-01-12 DIAGNOSIS — Z885 Allergy status to narcotic agent status: Secondary | ICD-10-CM | POA: Diagnosis not present

## 2018-01-12 DIAGNOSIS — B9689 Other specified bacterial agents as the cause of diseases classified elsewhere: Secondary | ICD-10-CM | POA: Diagnosis not present

## 2018-01-12 HISTORY — PX: KNEE SURGERY: SHX244

## 2018-01-12 HISTORY — PX: EXCISIONAL TOTAL KNEE ARTHROPLASTY WITH ANTIBIOTIC SPACERS: SHX5827

## 2018-01-12 SURGERY — REMOVAL, TOTAL ARTHROPLASTY HARDWARE, KNEE, WITH ANTIBIOTIC SPACER INSERTION
Anesthesia: Monitor Anesthesia Care | Site: Knee | Laterality: Right

## 2018-01-12 MED ORDER — CEFAZOLIN SODIUM-DEXTROSE 2-4 GM/100ML-% IV SOLN
2.0000 g | Freq: Four times a day (QID) | INTRAVENOUS | Status: AC
  Administered 2018-01-13: 2 g via INTRAVENOUS
  Filled 2018-01-12 (×3): qty 100

## 2018-01-12 MED ORDER — METOCLOPRAMIDE HCL 5 MG/ML IJ SOLN: 5.0000 mg | Freq: Three times a day (TID) | INTRAMUSCULAR | Status: DC | PRN

## 2018-01-12 MED ORDER — ASPIRIN EC 325 MG PO TBEC
325.0000 mg | DELAYED_RELEASE_TABLET | Freq: Two times a day (BID) | ORAL | Status: DC
  Administered 2018-01-12 – 2018-01-16 (×8): 325 mg via ORAL
  Filled 2018-01-12 (×8): qty 1

## 2018-01-12 MED ORDER — BUPIVACAINE LIPOSOME 1.3 % IJ SUSP
20.0000 mL | INTRAMUSCULAR | Status: DC
  Filled 2018-01-12: qty 20

## 2018-01-12 MED ORDER — SENNOSIDES-DOCUSATE SODIUM 8.6-50 MG PO TABS: 1.0000 | ORAL_TABLET | Freq: Every evening | ORAL | Status: DC | PRN

## 2018-01-12 MED ORDER — OXYCODONE HCL 5 MG PO TABS
5.0000 mg | ORAL_TABLET | ORAL | Status: DC | PRN
  Administered 2018-01-12: 10 mg via ORAL
  Administered 2018-01-12: 5 mg via ORAL
  Administered 2018-01-13 – 2018-01-16 (×10): 10 mg via ORAL
  Filled 2018-01-12 (×11): qty 2

## 2018-01-12 MED ORDER — SODIUM CHLORIDE 0.9 % IV SOLN
INTRAVENOUS | Status: DC | PRN
  Administered 2018-01-12: 10 ug/min via INTRAVENOUS

## 2018-01-12 MED ORDER — ALUM & MAG HYDROXIDE-SIMETH 200-200-20 MG/5ML PO SUSP: 30.0000 mL | ORAL | Status: DC | PRN

## 2018-01-12 MED ORDER — ZOLPIDEM TARTRATE 5 MG PO TABS: 5.0000 mg | ORAL_TABLET | Freq: Every evening | ORAL | Status: DC | PRN

## 2018-01-12 MED ORDER — ONDANSETRON HCL 4 MG/2ML IJ SOLN: 4.0000 mg | Freq: Four times a day (QID) | INTRAMUSCULAR | Status: DC | PRN

## 2018-01-12 MED ORDER — BISACODYL 5 MG PO TBEC
5.0000 mg | DELAYED_RELEASE_TABLET | Freq: Every day | ORAL | Status: DC | PRN
  Administered 2018-01-16: 5 mg via ORAL
  Filled 2018-01-12: qty 1

## 2018-01-12 MED ORDER — ONDANSETRON HCL 4 MG/2ML IJ SOLN
INTRAMUSCULAR | Status: DC | PRN
  Administered 2018-01-12: 4 mg via INTRAVENOUS

## 2018-01-12 MED ORDER — BUPIVACAINE-EPINEPHRINE 0.25% -1:200000 IJ SOLN
INTRAMUSCULAR | Status: DC | PRN
  Administered 2018-01-12: 30 mL

## 2018-01-12 MED ORDER — AMLODIPINE BESYLATE 5 MG PO TABS
5.0000 mg | ORAL_TABLET | Freq: Every morning | ORAL | Status: DC
  Administered 2018-01-13 – 2018-01-16 (×5): 5 mg via ORAL
  Filled 2018-01-12 (×5): qty 1

## 2018-01-12 MED ORDER — SODIUM CHLORIDE 0.9 % IJ SOLN
INTRAMUSCULAR | Status: DC | PRN
  Administered 2018-01-12: 30 mL

## 2018-01-12 MED ORDER — ACETAMINOPHEN 500 MG PO TABS
1000.0000 mg | ORAL_TABLET | Freq: Four times a day (QID) | ORAL | Status: AC
  Administered 2018-01-12 – 2018-01-13 (×4): 1000 mg via ORAL
  Filled 2018-01-12 (×4): qty 2

## 2018-01-12 MED ORDER — DEXAMETHASONE SODIUM PHOSPHATE 10 MG/ML IJ SOLN
10.0000 mg | Freq: Once | INTRAMUSCULAR | Status: AC
  Administered 2018-01-13: 10 mg via INTRAVENOUS
  Filled 2018-01-12: qty 1

## 2018-01-12 MED ORDER — CHLORHEXIDINE GLUCONATE 4 % EX LIQD: 60.0000 mL | Freq: Once | CUTANEOUS | Status: DC

## 2018-01-12 MED ORDER — MIDAZOLAM HCL 2 MG/2ML IJ SOLN
INTRAMUSCULAR | Status: AC
  Filled 2018-01-12: qty 2

## 2018-01-12 MED ORDER — 0.9 % SODIUM CHLORIDE (POUR BTL) OPTIME
TOPICAL | Status: DC | PRN
  Administered 2018-01-12: 1000 mL

## 2018-01-12 MED ORDER — FENTANYL CITRATE (PF) 250 MCG/5ML IJ SOLN
INTRAMUSCULAR | Status: AC
  Filled 2018-01-12: qty 5

## 2018-01-12 MED ORDER — TRANEXAMIC ACID 1000 MG/10ML IV SOLN
1000.0000 mg | Freq: Once | INTRAVENOUS | Status: AC
  Administered 2018-01-12: 1000 mg via INTRAVENOUS
  Filled 2018-01-12: qty 10

## 2018-01-12 MED ORDER — POVIDONE-IODINE 10 % EX SWAB: 2.0000 "application " | Freq: Once | CUTANEOUS | Status: DC

## 2018-01-12 MED ORDER — PHENOL 1.4 % MT LIQD: 1.0000 | OROMUCOSAL | Status: DC | PRN

## 2018-01-12 MED ORDER — GABAPENTIN 300 MG PO CAPS
300.0000 mg | ORAL_CAPSULE | Freq: Three times a day (TID) | ORAL | Status: DC
  Administered 2018-01-12 – 2018-01-16 (×12): 300 mg via ORAL
  Filled 2018-01-12 (×13): qty 1

## 2018-01-12 MED ORDER — METOCLOPRAMIDE HCL 5 MG PO TABS: 5.0000 mg | ORAL_TABLET | Freq: Three times a day (TID) | ORAL | Status: DC | PRN

## 2018-01-12 MED ORDER — DOCUSATE SODIUM 100 MG PO CAPS
100.0000 mg | ORAL_CAPSULE | Freq: Two times a day (BID) | ORAL | Status: DC
  Administered 2018-01-12 – 2018-01-16 (×8): 100 mg via ORAL
  Filled 2018-01-12 (×8): qty 1

## 2018-01-12 MED ORDER — SODIUM CHLORIDE 0.9 % IR SOLN
Status: DC | PRN
  Administered 2018-01-12: 3000 mL

## 2018-01-12 MED ORDER — LEVOTHYROXINE SODIUM 100 MCG PO TABS
100.0000 ug | ORAL_TABLET | Freq: Every day | ORAL | Status: DC
  Administered 2018-01-13 – 2018-01-16 (×4): 100 ug via ORAL
  Filled 2018-01-12 (×4): qty 1

## 2018-01-12 MED ORDER — FENTANYL CITRATE (PF) 250 MCG/5ML IJ SOLN
INTRAMUSCULAR | Status: DC | PRN
  Administered 2018-01-12: 50 ug via INTRAVENOUS

## 2018-01-12 MED ORDER — OXYCODONE HCL 5 MG PO TABS
ORAL_TABLET | ORAL | Status: AC
  Filled 2018-01-12: qty 1

## 2018-01-12 MED ORDER — LACTATED RINGERS IV SOLN
INTRAVENOUS | Status: DC
  Administered 2018-01-12: 10:00:00 via INTRAVENOUS

## 2018-01-12 MED ORDER — METHOCARBAMOL 500 MG PO TABS
500.0000 mg | ORAL_TABLET | Freq: Four times a day (QID) | ORAL | Status: DC | PRN
  Administered 2018-01-14 – 2018-01-16 (×6): 500 mg via ORAL
  Filled 2018-01-12 (×7): qty 1

## 2018-01-12 MED ORDER — ONDANSETRON HCL 4 MG PO TABS: 4.0000 mg | ORAL_TABLET | Freq: Four times a day (QID) | ORAL | Status: DC | PRN

## 2018-01-12 MED ORDER — FENTANYL CITRATE (PF) 100 MCG/2ML IJ SOLN
INTRAMUSCULAR | Status: AC
  Filled 2018-01-12: qty 2

## 2018-01-12 MED ORDER — ONDANSETRON HCL 4 MG/2ML IJ SOLN
INTRAMUSCULAR | Status: AC
  Filled 2018-01-12: qty 2

## 2018-01-12 MED ORDER — TRAZODONE HCL 50 MG PO TABS
50.0000 mg | ORAL_TABLET | Freq: Every day | ORAL | Status: DC
  Administered 2018-01-12 – 2018-01-15 (×4): 50 mg via ORAL
  Filled 2018-01-12 (×4): qty 1

## 2018-01-12 MED ORDER — BUPIVACAINE LIPOSOME 1.3 % IJ SUSP
10.0000 mL | Freq: Once | INTRAMUSCULAR | Status: AC
  Administered 2018-01-12: 10 mL
  Filled 2018-01-12: qty 10

## 2018-01-12 MED ORDER — HYDROMORPHONE HCL 1 MG/ML IJ SOLN: 0.5000 mg | INTRAMUSCULAR | Status: DC | PRN

## 2018-01-12 MED ORDER — FERROUS SULFATE 325 (65 FE) MG PO TABS
325.0000 mg | ORAL_TABLET | Freq: Three times a day (TID) | ORAL | Status: DC
  Administered 2018-01-12 – 2018-01-16 (×11): 325 mg via ORAL
  Filled 2018-01-12 (×11): qty 1

## 2018-01-12 MED ORDER — PANTOPRAZOLE SODIUM 40 MG PO TBEC
40.0000 mg | DELAYED_RELEASE_TABLET | Freq: Every day | ORAL | Status: DC
  Administered 2018-01-13 – 2018-01-16 (×4): 40 mg via ORAL
  Filled 2018-01-12 (×4): qty 1

## 2018-01-12 MED ORDER — PROPOFOL 500 MG/50ML IV EMUL
INTRAVENOUS | Status: DC | PRN
  Administered 2018-01-12: 75 ug/kg/min via INTRAVENOUS

## 2018-01-12 MED ORDER — MENTHOL 3 MG MT LOZG: 1.0000 | LOZENGE | OROMUCOSAL | Status: DC | PRN

## 2018-01-12 MED ORDER — DIPHENHYDRAMINE HCL 12.5 MG/5ML PO ELIX: 12.5000 mg | ORAL_SOLUTION | ORAL | Status: DC | PRN

## 2018-01-12 MED ORDER — METHOCARBAMOL 1000 MG/10ML IJ SOLN
500.0000 mg | Freq: Four times a day (QID) | INTRAVENOUS | Status: DC | PRN
  Filled 2018-01-12: qty 5

## 2018-01-12 MED ORDER — BUPIVACAINE IN DEXTROSE 0.75-8.25 % IT SOLN
INTRATHECAL | Status: DC | PRN
  Administered 2018-01-12: 12 mg via INTRATHECAL

## 2018-01-12 MED ORDER — BUPIVACAINE-EPINEPHRINE (PF) 0.25% -1:200000 IJ SOLN
INTRAMUSCULAR | Status: AC
  Filled 2018-01-12: qty 30

## 2018-01-12 MED ORDER — FLEET ENEMA 7-19 GM/118ML RE ENEM
1.0000 | ENEMA | Freq: Once | RECTAL | Status: AC | PRN
  Administered 2018-01-16: 1 via RECTAL
  Filled 2018-01-12: qty 1

## 2018-01-12 SURGICAL SUPPLY — 71 items
BAG URINE DRAINAGE (UROLOGICAL SUPPLIES) IMPLANT
BANDAGE ACE 6X5 VEL STRL LF (GAUZE/BANDAGES/DRESSINGS) ×2 IMPLANT
BANDAGE ESMARK 6X9 LF (GAUZE/BANDAGES/DRESSINGS) ×1 IMPLANT
BLADE SURG 10 STRL SS (BLADE) ×14 IMPLANT
BLADE SURG 21 STRL SS (BLADE) ×2 IMPLANT
BNDG CMPR 9X6 STRL LF SNTH (GAUZE/BANDAGES/DRESSINGS) ×1
BNDG ESMARK 6X9 LF (GAUZE/BANDAGES/DRESSINGS) ×3
BOWL SMART MIX CTS (DISPOSABLE) ×3 IMPLANT
CEMENT BONE REFOBACIN R1X40 US (Cement) ×8 IMPLANT
CONT SPEC 4OZ CLIKSEAL STRL BL (MISCELLANEOUS) ×6 IMPLANT
COVER SURGICAL LIGHT HANDLE (MISCELLANEOUS) ×3 IMPLANT
CUFF TOURNIQUET SINGLE 34IN LL (TOURNIQUET CUFF) IMPLANT
DRAPE HALF SHEET 40X57 (DRAPES) ×6 IMPLANT
DRAPE IMP U-DRAPE 54X76 (DRAPES) ×3 IMPLANT
DRAPE INCISE IOBAN 66X45 STRL (DRAPES) IMPLANT
DRAPE U-SHAPE 47X51 STRL (DRAPES) ×3 IMPLANT
DRSG ADAPTIC 3X8 NADH LF (GAUZE/BANDAGES/DRESSINGS) ×3 IMPLANT
DRSG AQUACEL AG ADV 3.5X10 (GAUZE/BANDAGES/DRESSINGS) ×2 IMPLANT
DRSG PAD ABDOMINAL 8X10 ST (GAUZE/BANDAGES/DRESSINGS) ×3 IMPLANT
DURAPREP 26ML APPLICATOR (WOUND CARE) ×6 IMPLANT
ELECT REM PT RETURN 9FT ADLT (ELECTROSURGICAL) ×3
ELECTRODE REM PT RTRN 9FT ADLT (ELECTROSURGICAL) ×1 IMPLANT
EVACUATOR 1/8 PVC DRAIN (DRAIN) ×3 IMPLANT
GAUZE SPONGE 4X4 12PLY STRL (GAUZE/BANDAGES/DRESSINGS) ×3 IMPLANT
GLOVE BIOGEL PI IND STRL 8.5 (GLOVE) ×4 IMPLANT
GLOVE BIOGEL PI INDICATOR 8.5 (GLOVE) ×8
GLOVE BIOGEL PI ORTHO PRO SZ8 (GLOVE)
GLOVE PI ORTHO PRO STRL SZ8 (GLOVE) IMPLANT
GLOVE SURG ORTHO 8.0 STRL STRW (GLOVE) ×18 IMPLANT
GOWN STRL REUS W/ TWL LRG LVL3 (GOWN DISPOSABLE) ×2 IMPLANT
GOWN STRL REUS W/ TWL XL LVL3 (GOWN DISPOSABLE) ×2 IMPLANT
GOWN STRL REUS W/TWL 2XL LVL3 (GOWN DISPOSABLE) ×3 IMPLANT
GOWN STRL REUS W/TWL LRG LVL3 (GOWN DISPOSABLE) ×6
GOWN STRL REUS W/TWL XL LVL3 (GOWN DISPOSABLE) ×6
HANDPIECE INTERPULSE COAX TIP (DISPOSABLE) ×3
HOOD PEEL AWAY FACE SHEILD DIS (HOOD) ×6 IMPLANT
IMMOBILIZER KNEE 20 (SOFTGOODS) ×3
IMMOBILIZER KNEE 20 THIGH 36 (SOFTGOODS) IMPLANT
KIT BASIN OR (CUSTOM PROCEDURE TRAY) ×3 IMPLANT
KIT TURNOVER KIT B (KITS) ×3 IMPLANT
MANIFOLD NEPTUNE II (INSTRUMENTS) ×3 IMPLANT
MOLD CEMENT FEMORAL 60MM (DISPOSABLE) ×2 IMPLANT
NS IRRIG 1000ML POUR BTL (IV SOLUTION) ×3 IMPLANT
PACK TOTAL JOINT (CUSTOM PROCEDURE TRAY) ×3 IMPLANT
PACK UNIVERSAL I (CUSTOM PROCEDURE TRAY) ×3 IMPLANT
PAD ARMBOARD 7.5X6 YLW CONV (MISCELLANEOUS) ×6 IMPLANT
PADDING CAST COTTON 6X4 STRL (CAST SUPPLIES) ×3 IMPLANT
SET HNDPC FAN SPRY TIP SCT (DISPOSABLE) ×1 IMPLANT
STAPLER VISISTAT 35W (STAPLE) ×3 IMPLANT
SUCTION FRAZIER HANDLE 10FR (MISCELLANEOUS) ×2
SUCTION TUBE FRAZIER 10FR DISP (MISCELLANEOUS) ×1 IMPLANT
SUT PDS AB 1 CTXB1 36 (SUTURE) IMPLANT
SUT VIC AB 0 CTB1 27 (SUTURE) IMPLANT
SUT VIC AB 1 CT1 27 (SUTURE)
SUT VIC AB 1 CT1 27XBRD ANBCTR (SUTURE) IMPLANT
SUT VIC AB 2-0 CT1 27 (SUTURE)
SUT VIC AB 2-0 CT1 TAPERPNT 27 (SUTURE) IMPLANT
SUT VLOC 180 0 24IN GS25 (SUTURE) ×4 IMPLANT
SWAB COLLECTION DEVICE MRSA (MISCELLANEOUS) ×2 IMPLANT
SWAB CULTURE ESWAB REG 1ML (MISCELLANEOUS) ×2 IMPLANT
SYR 10ML LL (SYRINGE) IMPLANT
SYR 20CC LL (SYRINGE) IMPLANT
SYSTEM VACUUM CEMENT MIXING (MISCELLANEOUS) ×4 IMPLANT
TOWEL OR 17X24 6PK STRL BLUE (TOWEL DISPOSABLE) ×3 IMPLANT
TOWEL OR 17X26 10 PK STRL BLUE (TOWEL DISPOSABLE) ×3 IMPLANT
TRAY FOLEY MTR SLVR 16FR STAT (SET/KITS/TRAYS/PACK) IMPLANT
TUBE CONNECTING 12'X1/4 (SUCTIONS) ×1
TUBE CONNECTING 12X1/4 (SUCTIONS) ×1 IMPLANT
WATER STERILE IRR 1000ML POUR (IV SOLUTION) ×9 IMPLANT
WRAP KNEE MAXI GEL POST OP (GAUZE/BANDAGES/DRESSINGS) ×2 IMPLANT
YANKAUER SUCT BULB TIP NO VENT (SUCTIONS) ×2 IMPLANT

## 2018-01-12 NOTE — Op Note (Signed)
TOTAL KNEE REPLACEMENT OPERATIVE NOTE:  01/12/2018  3:33 PM  PATIENT:  Lisa Torres  72 y.o. female  PRE-OPERATIVE DIAGNOSIS:  Loosening/Failed TKA  POST-OPERATIVE DIAGNOSIS:  Loosening/Failed TKA  PROCEDURE:  Procedure(s): EXCISIONAL TOTAL KNEE ARTHROPLASTY WITH ANTIBIOTIC SPACERS  SURGEON:  Surgeon(s): Vickey Huger, MD  PHYSICIAN ASSISTANT: Carlyon Shadow, [facility]  ANESTHESIA:   spinal  DRAINS: Hemovac  SPECIMEN: None  COUNTS:  Correct  TOURNIQUET:   Total Tourniquet Time Documented: area (laterality) - 43 minutes Total: area (laterality) - 43 minutes   DICTATION:  Indication for procedure:    The patient is a 72 y.o. female who has failed conservative treatment for Loosening/Failed TKA.  Informed consent was obtained prior to anesthesia. The risks versus benefits of the operation were explain and in a way the patient can, and did, understand.   On the implant demand matching protocol, this patient scored 9.  Therefore, this patient was not receive a polyethylene insert with vitamin E which is a high demand implant.  Description of procedure:     The patient was taken to the operating room and placed under anesthesia.  The patient was positioned in the usual fashion taking care that all body parts were adequately padded and/or protected.  I foley catheter was not placed.  A tourniquet was applied and the leg prepped and draped in the usual sterile fashion.  The extremity was exsanguinated with the esmarch and tourniquet inflated to 350 mmHg.  Pre-operative range of motion was 25-85 degrees flexion.  The knee was in 10 degree of mild varus.  A midline incision approximately 6-7 inches long was made with a #10 blade.  A new blade was used to make a parapatellar arthrotomy going 2-3 cm into the quadriceps tendon, over the patella, and alongside the medial aspect of the patellar tendon.  A synovectomy was then performed with the #10 blade and forceps. I then elevated the  deep MCL off the medial tibial metaphysis subperiosteally around to the semimembranosus attachment.    I did a complete synovectomy after sending cultures.  I easily removed the components with a micro E saw and osteotomes.  I debrided the bone and non-viable soft tissue.  I then irrigated copiously.  I fashioned a femoralo component (matching the size) with antibiotic impregnated cement and cemented into place in extension. I buttered the tibia.  My goal was for her to be in extension during the 6 weeks of antibiotics so I could get that easily at the revision operation.    Her extension space was tight and her flexion space was fine.  So, I will start the revision taking 54mm of distal femur to correct that.     The wound was dressed with xeroform, 4 x4's, 2 ABD sponges, a single layer of webril and a TED stocking.   The patient was then awakened, extubated, and taken to the recovery room in stable condition.  BLOOD LOSS:  962EZ  COMPLICATIONS:  None.  PLAN OF CARE: Admit to inpatient.  PATIENT DISPOSITION:  PACU - hemodynamically stable.   Delay start of Pharmacological VTE agent (>24hrs) due to surgical blood loss or risk of bleeding:  not applicable  Please fax a copy of this op note to my office at [phone] (please only include page 1 and 2 of the Case Information op note)

## 2018-01-12 NOTE — Transfer of Care (Signed)
Immediate Anesthesia Transfer of Care Note  Patient: Lisa Torres  Procedure(s) Performed: EXCISIONAL TOTAL KNEE ARTHROPLASTY WITH ANTIBIOTIC SPACERS (Right Knee)  Patient Location: PACU  Anesthesia Type:Spinal and MAC combined with regional for post-op pain  Level of Consciousness: awake, alert  and oriented  Airway & Oxygen Therapy: Patient Spontanous Breathing  Post-op Assessment: Report given to RN and Post -op Vital signs reviewed and stable  Post vital signs: Reviewed and stable  Last Vitals:  Vitals Value Taken Time  BP 155/97 01/12/2018  2:03 PM  Temp    Pulse 59 01/12/2018  2:07 PM  Resp 11 01/12/2018  2:07 PM  SpO2 100 % 01/12/2018  2:07 PM  Vitals shown include unvalidated device data.  Last Pain:  Vitals:   01/12/18 1405  TempSrc:   PainSc: (P) 0-No pain      Patients Stated Pain Goal: 0 (73/41/93 7902)  Complications: No apparent anesthesia complications

## 2018-01-12 NOTE — Progress Notes (Signed)
Orthopedic Tech Progress Note Patient Details:  Lisa Torres September 03, 1945 [id number]  Ortho Devices Type of Ortho Device: Bone foam zero knee Ortho Device/Splint Location: rle Ortho Device/Splint Interventions: Application   Post Interventions Patient Tolerated: Well Instructions Provided: Care of device   Hildred Priest 01/12/2018, 2:28 PM

## 2018-01-12 NOTE — Anesthesia Preprocedure Evaluation (Signed)
Anesthesia Evaluation  Patient identified by MRN, date of birth, ID band Patient awake    Reviewed: Allergy & Precautions, NPO status , Patient's Chart, lab work & pertinent test results  History of Anesthesia Complications Negative for: history of anesthetic complications  Airway Mallampati: II  TM Distance: >3 FB Neck ROM: Full    Dental  (+) Dental Advisory Given   Pulmonary neg shortness of breath, neg COPD, neg recent URI, former smoker,    breath sounds clear to auscultation       Cardiovascular hypertension, Pt. on medications  Rhythm:Regular     Neuro/Psych negative neurological ROS  negative psych ROS   GI/Hepatic Neg liver ROS, GERD  Medicated and Controlled,  Endo/Other  Hypothyroidism   Renal/GU negative Renal ROS     Musculoskeletal  (+) Arthritis ,   Abdominal   Peds  Hematology negative hematology ROS (+)   Anesthesia Other Findings   Reproductive/Obstetrics                             Anesthesia Physical Anesthesia Plan  ASA: II  Anesthesia Plan: MAC and Spinal   Post-op Pain Management:    Induction:   PONV Risk Score and Plan: 2 and Treatment may vary due to age or medical condition  Airway Management Planned: Nasal Cannula  Additional Equipment: None  Intra-op Plan:   Post-operative Plan:   Informed Consent: I have reviewed the patients History and Physical, chart, labs and discussed the procedure including the risks, benefits and alternatives for the proposed anesthesia with the patient or authorized representative who has indicated his/her understanding and acceptance.   Dental advisory given  Plan Discussed with: CRNA and Surgeon  Anesthesia Plan Comments:         Anesthesia Quick Evaluation

## 2018-01-12 NOTE — H&P (Signed)
Lisa Torres MRN:  [id number] DOB/SEX:  10/18/1945/female  CHIEF COMPLAINT:  Painful right Knee  HISTORY: Patient is a 72 y.o. female presented with a history of pain in the right knee. Onset of symptoms was gradual starting a few months ago with gradually worsening course since that time. Prior procedures on the knee include arthroplasty. Patient has been treated conservatively with over-the-counter NSAIDs and activity modification. Patient currently rates pain in the knee at 10 out of 10 with activity. There is pain at night.  PAST MEDICAL HISTORY: Patient Active Problem List   Diagnosis Date Noted  . Other chest pain 11/04/2017  . Abdominal pain, epigastric 11/04/2017  . Chest pain 01/05/2017  . Breast cancer, left breast ([place]) 11/02/2015  . Difficulty walking 10/20/2013  . Parkinson disease ([place]) 08/24/2013  . Hypothyroid 05/05/2013  . Hypertension 05/05/2013   Past Medical History:  Diagnosis Date  . Breast cancer ([place]) 11/2011  . Chronic back pain   . Chronic leg pain    bilateral  . Chronic leukopenia   . DDD (degenerative disc disease), cervical   . DDD (degenerative disc disease), lumbar   . Gait abnormality   . GERD (gastroesophageal reflux disease)   . Hypertension   . Parkinson's disease ([place])   . Radiation 04/2012   33 treatments for breast cancer  . Right knee pain   . Thyroid disease    Past Surgical History:  Procedure Laterality Date  . ABDOMINAL HYSTERECTOMY     partial  . BREAST LUMPECTOMY Left 12/2011  . CHOLECYSTECTOMY    . COLONOSCOPY N/A 07/01/2013   Procedure: COLONOSCOPY;  Surgeon: Rogene Houston, MD;  [facility];  Service: Endoscopy;  Laterality: N/A;  1030  . ESOPHAGOGASTRODUODENOSCOPY N/A 11/21/2017   Procedure: ESOPHAGOGASTRODUODENOSCOPY (EGD);  Surgeon: Rogene Houston, MD;  Location: [facility];  Service: Endoscopy;  Laterality: N/A;  2:45  . JOINT REPLACEMENT  01/2017   right knee  . TUBAL LIGATION        MEDICATIONS:   Medications Prior to Admission  Medication Sig Dispense Refill Last Dose  . acetaminophen (TYLENOL) 500 MG tablet Take 1,000 mg by mouth 3 (three) times daily as needed for moderate pain or headache.    Taking  . alum & mag hydroxide-simeth (MAALOX/MYLANTA) 200-200-20 MG/5ML suspension Take 15 mLs by mouth every 6 (six) hours as needed for indigestion or heartburn.   Taking  . amLODipine (NORVASC) 5 MG tablet Take 5 mg by mouth every morning.    Taking  . ibuprofen (ADVIL,MOTRIN) 200 MG tablet Take 400 mg by mouth every 6 (six) hours as needed for headache or moderate pain.   Taking  . levothyroxine (SYNTHROID, LEVOTHROID) 100 MCG tablet Take 100 mcg by mouth daily before breakfast.   Taking  . Melatonin 3 MG CAPS Take 3 mg by mouth as needed.    Taking  . pantoprazole (PROTONIX) 40 MG tablet Take 1 tablet (40 mg total) by mouth daily before breakfast. 30 tablet 5 Taking  . traZODone (DESYREL) 50 MG tablet Take 50 mg by mouth at bedtime  6 Taking  . triamcinolone cream (KENALOG) 0.1 % APPLY TO AFFECTED AREA UP TO TWICE DAILY AS NEEDED FOR A RASH  (NOT TO FACE, GROIN, OR UNDERARMS)  3 Taking    ALLERGIES:   Allergies  Allergen Reactions  . Hydrocodone Other (See Comments)    Heart pain  Delusions  . Tramadol Other (See Comments)    Hallucinations, abd pain Delusions (  intolerance)    REVIEW OF SYSTEMS:  A comprehensive review of systems was negative except for: Musculoskeletal: positive for arthralgias and bone pain   FAMILY HISTORY:   Family History  Problem Relation Age of Onset  . Cancer Mother 22       colon cancer  . Cancer Father 75       throat cancer  . Pancreatic cancer Brother        63y 11-14-15    SOCIAL HISTORY:   Social History   Tobacco Use  . Smoking status: Former Smoker    Packs/day: 0.50    Years: 18.00    Pack years: 9.00    Types: Cigarettes    Last attempt to quit: 09/30/1998    Years since quitting: 19.2  . Smokeless tobacco:  Never Used  Substance Use Topics  . Alcohol use: No     EXAMINATION:  Vital signs in last 24 hours:    There were no vitals taken for this visit.  General Appearance:    Alert, cooperative, no distress, appears stated age  Head:    Normocephalic, without obvious abnormality, atraumatic  Eyes:    PERRL, conjunctiva/corneas clear, EOM's intact, fundi    benign, both eyes  Ears:    Normal TM's and external ear canals, both ears  Nose:   Nares normal, septum midline, mucosa normal, no drainage    or sinus tenderness  Throat:   Lips, mucosa, and tongue normal; teeth and gums normal  Neck:   Supple, symmetrical, trachea midline, no adenopathy;    thyroid:  no enlargement/tenderness/nodules; no carotid   bruit or JVD  Back:     Symmetric, no curvature, ROM normal, no CVA tenderness  Lungs:     Clear to auscultation bilaterally, respirations unlabored  Chest Wall:    No tenderness or deformity   Heart:    Regular rate and rhythm, S1 and S2 normal, no murmur, rub   or gallop  Breast Exam:    No tenderness, masses, or nipple abnormality  Abdomen:     Soft, non-tender, bowel sounds active all four quadrants,    no masses, no organomegaly  Genitalia:    Normal female without lesion, discharge or tenderness  Rectal:    Normal tone, no masses or tenderness;   guaiac negative stool  Extremities:   Extremities normal, atraumatic, no cyanosis or edema  Pulses:   2+ and symmetric all extremities  Skin:   Skin color, texture, turgor normal, no rashes or lesions  Lymph nodes:   Cervical, supraclavicular, and axillary nodes normal  Neurologic:   CNII-XII intact, normal strength, sensation and reflexes    throughout    Musculoskeletal:  ROM -40-60, Ligaments intact,  Imaging Review Plain radiographs demonstrate s/p TKA of the right knee. The overall alignment is neutral. The bone quality appears to be good for age and reported activity level.  Assessment/Plan: Infected right TKA  The patient  history, physical examination and imaging studies are consistent with infected TKA of the right knee. The patient has failed conservative treatment.  The clearance notes were reviewed.  After discussion with the patient it was felt that excision arthroplasty with antibiotic cement spacer was indicated. The procedure,  risks, and benefits were presented and reviewed. The risks including but not limited to aseptic loosening, infection, blood clots, vascular injury, stiffness, patella tracking problems complications among others were discussed. The patient acknowledged the explanation, agreed to proceed with the plan.  Preoperative templating of the joint  replacement has been completed, documented, and submitted to the Operating Room personnel in order to optimize intra-operative equipment management.   Anticipated LOS equal to or greater than 2 midnights due to - Age 57 and older with one or more of the following:  - Obesity  - Expected need for hospital services (PT, OT, Nursing) required for safe  discharge  - Anticipated need for postoperative skilled nursing care or inpatient rehab     Donia Ast 01/12/2018, 9:10 AM

## 2018-01-12 NOTE — Anesthesia Procedure Notes (Signed)
Spinal  Patient location during procedure: OR Start time: 01/12/2018 12:05 PM End time: 01/12/2018 12:08 PM Staffing Anesthesiologist: Roderic Palau, MD Performed: anesthesiologist  Preanesthetic Checklist Completed: patient identified, surgical consent, pre-op evaluation, timeout performed, IV checked, risks and benefits discussed and monitors and equipment checked Spinal Block Patient position: sitting Prep: DuraPrep Patient monitoring: cardiac monitor, continuous pulse ox and blood pressure Approach: midline Location: L3-4 Injection technique: single-shot Needle Needle type: Pencan  Needle gauge: 24 G Needle length: 9 cm Assessment Sensory level: T8 Additional Notes Functioning IV was confirmed and monitors were applied. Sterile prep and drape, including hand hygiene and sterile gloves were used. The patient was positioned and the spine was prepped. The skin was anesthetized with lidocaine.  Free flow of clear CSF was obtained prior to injecting local anesthetic into the CSF.  The spinal needle aspirated freely following injection.  The needle was carefully withdrawn.  The patient tolerated the procedure well.

## 2018-01-12 NOTE — Anesthesia Procedure Notes (Signed)
Date/Time: 01/12/2018 12:05 PM Performed by: Carver Fila, RN Oxygen Delivery Method: Simple face mask Placement Confirmation: positive ETCO2 Dental Injury: Teeth and Oropharynx as per pre-operative assessment

## 2018-01-13 ENCOUNTER — Inpatient Hospital Stay: Payer: Self-pay

## 2018-01-13 ENCOUNTER — Encounter (HOSPITAL_COMMUNITY): Payer: Self-pay | Admitting: General Practice

## 2018-01-13 ENCOUNTER — Other Ambulatory Visit: Payer: Self-pay

## 2018-01-13 DIAGNOSIS — Z885 Allergy status to narcotic agent status: Secondary | ICD-10-CM

## 2018-01-13 DIAGNOSIS — T8454XD Infection and inflammatory reaction due to internal left knee prosthesis, subsequent encounter: Secondary | ICD-10-CM

## 2018-01-13 DIAGNOSIS — Z87891 Personal history of nicotine dependence: Secondary | ICD-10-CM

## 2018-01-13 LAB — BASIC METABOLIC PANEL
ANION GAP: 5 (ref 5–15)
BUN: 7 mg/dL — ABNORMAL LOW (ref 8–23)
CO2: 28 mmol/L (ref 22–32)
Calcium: 8.9 mg/dL (ref 8.9–10.3)
Chloride: 103 mmol/L (ref 98–111)
Creatinine, Ser: 0.78 mg/dL (ref 0.44–1.00)
GFR calc non Af Amer: >60 mL/min (ref >60)
Glucose, Bld: 96 mg/dL (ref 70–99)
POTASSIUM: 3.6 mmol/L (ref 3.5–5.1)
SODIUM: 136 mmol/L (ref 135–145)

## 2018-01-13 LAB — CBC
HEMATOCRIT: 33.3 % — AB (ref 36.0–46.0)
HEMOGLOBIN: 10.3 g/dL — AB (ref 12.0–15.0)
MCH: 27.5 pg (ref 26.0–34.0)
MCHC: 30.9 g/dL (ref 30.0–36.0)
MCV: 88.8 fL (ref 78.0–100.0)
Platelets: 204 10*3/uL (ref 150–400)
RBC: 3.75 MIL/uL — AB (ref 3.87–5.11)
RDW: 12.3 % (ref 11.5–15.5)
WBC: 5.8 10*3/uL (ref 4.0–10.5)

## 2018-01-13 MED ORDER — ASPIRIN 325 MG PO TBEC: 325.0000 mg | DELAYED_RELEASE_TABLET | Freq: Two times a day (BID) | ORAL | 0 refills | Status: DC

## 2018-01-13 MED ORDER — VANCOMYCIN HCL 10 G IV SOLR
1250.0000 mg | Freq: Once | INTRAVENOUS | Status: AC
  Administered 2018-01-13: 1250 mg via INTRAVENOUS
  Filled 2018-01-13: qty 1250

## 2018-01-13 MED ORDER — SODIUM CHLORIDE 0.9% FLUSH
10.0000 mL | Freq: Two times a day (BID) | INTRAVENOUS | Status: DC
  Administered 2018-01-14 – 2018-01-16 (×3): 10 mL

## 2018-01-13 MED ORDER — ENSURE ENLIVE PO LIQD
237.0000 mL | Freq: Two times a day (BID) | ORAL | Status: DC
  Administered 2018-01-13 – 2018-01-16 (×7): 237 mL via ORAL

## 2018-01-13 MED ORDER — SODIUM CHLORIDE 0.9 % IV SOLN
2.0000 g | INTRAVENOUS | Status: DC
  Administered 2018-01-13 – 2018-01-14 (×2): 2 g via INTRAVENOUS
  Filled 2018-01-13 (×2): qty 20

## 2018-01-13 MED ORDER — VANCOMYCIN HCL 500 MG IV SOLR
500.0000 mg | Freq: Two times a day (BID) | INTRAVENOUS | Status: DC
  Administered 2018-01-13 – 2018-01-16 (×6): 500 mg via INTRAVENOUS
  Filled 2018-01-13 (×7): qty 500

## 2018-01-13 MED ORDER — OXYCODONE HCL 5 MG PO TABS: 5.0000 mg | ORAL_TABLET | Freq: Four times a day (QID) | ORAL | 0 refills | Status: DC | PRN

## 2018-01-13 MED ORDER — METHOCARBAMOL 500 MG PO TABS: 500.0000 mg | ORAL_TABLET | Freq: Four times a day (QID) | ORAL | 0 refills | Status: DC | PRN

## 2018-01-13 MED ORDER — SODIUM CHLORIDE 0.9% FLUSH
10.0000 mL | INTRAVENOUS | Status: DC | PRN
  Administered 2018-01-16 (×2): 10 mL
  Filled 2018-01-13 (×2): qty 40

## 2018-01-13 NOTE — Progress Notes (Signed)
Pharmacy Antibiotic Note  Lisa Torres is a 72 y.o. female admitted on 01/12/2018 with prosthetic joint infection of right TKA.  Pharmacy has been consulted for vancomycin dosing. Patient went to the OR yesterday for excision of the total knee arthroplasty and placement of an antibiotic spacer. Patient does have a history of treatment with IV ceftriaxone in October of 2018 as well as courses of oral cephalexin and doxycycline. Culture from OR on 7/29 is NGTD and patient had a previous aspiration culture from 7/12 that showed corynebacterium.  Scr is stable at 0.78 with CrCl~ 58 ml/min.   Plan: Vancomycin 1250 mg X 1  Then Vancomycin 500 mg every 12 hours Monitor renal function, cultures and clinical progression    Temp (24hrs), Avg:98 F (36.7 C), Min:97.3 F (36.3 C), Max:98.4 F (36.9 C)  Recent Labs  Lab 01/13/18 0503  WBC 5.8  CREATININE 0.78    Estimated Creatinine Clearance: 57.6 mL/min (by C-G formula based on SCr of 0.78 mg/dL).    Allergies  Allergen Reactions  . Hydrocodone Other (See Comments)    Heart pain  Delusions  . Tramadol Other (See Comments)    Hallucinations, abd pain Delusions (intolerance)    Antimicrobials this admission: 7/30 Vanc>> 7/30 Ceftriaxone>>    Microbiology results: 7/29 right knee culture: NGTD  Thank you for allowing pharmacy to be a part of this patient's care.  Susa Raring, PharmD, BCPS Antimicrobial Stewardship Clinical Pharmacist Phone: [phone] 01/13/2018 10:30 AM

## 2018-01-13 NOTE — Plan of Care (Signed)
  Problem: Education: Goal: Knowledge of General Education information will improve Description Including pain rating scale, medication(s)/side effects and non-pharmacologic comfort measures Outcome: Progressing Note:  POC and pain management reviewed with pt.   

## 2018-01-13 NOTE — Consult Note (Signed)
[facility] for Infectious Disease       Reason for Consult: PJI    Referring Physician: Dr. Ronnie Derby  Active Problems:   S/P revision of total knee   . acetaminophen  1,000 mg Oral Q6H  . amLODipine  5 mg Oral q morning - 10a  . aspirin EC  325 mg Oral BID  . docusate sodium  100 mg Oral BID  . ferrous sulfate  325 mg Oral TID PC  . gabapentin  300 mg Oral TID  . levothyroxine  100 mcg Oral QAC breakfast  . pantoprazole  40 mg Oral QAC breakfast  . traZODone  50 mg Oral QHS    Recommendations: Vancomycin and ceftriaxone pending cultures [facility]   Assessment: She has had loosening of her prosthesis and now s/p resection arthroplasty with significant WBCs in knee c/w prosthetic joint infection.     Antibiotics: As above  HPI: Lisa Torres is a 72 y.o. female with a history of right knee TKA in August of 2018 has had issues with loosening and pain in the knee and recent aspiration with 17,000 WBCs and Corynebacteria in culture.  She went to the OR yesterday for above procedure.  She initially had issues with the knee with a superficial wound infection and placed on Keflex and then given doxycycline.  She underwent a washout in March 21, 2017 and continued on 5 weeks of IV antibiotics with ceftriaxone 1 gram daily and no culture ever grew out. She then continued doxycycline for 3 weeks but had continued range of motion issues and sent to Dr. Ronnie Derby for a second opinion in July with aspiration as above.     Review of Systems:  Constitutional: negative for fevers and chills Gastrointestinal: negative for nausea and diarrhea Integument/breast: negative for rash All other systems reviewed and are negative    Past Medical History:  Diagnosis Date  . Breast cancer ([place]) 11/2011  . Chronic back pain   . Chronic leg pain    bilateral  . Chronic leukopenia   . DDD (degenerative disc disease), cervical   . DDD (degenerative disc disease), lumbar   .  Gait abnormality   . GERD (gastroesophageal reflux disease)   . Hypertension   . Parkinson's disease ([place])   . Radiation 04/2012   33 treatments for breast cancer  . Right knee pain   . Thyroid disease     Social History   Tobacco Use  . Smoking status: Former Smoker    Packs/day: 0.50    Years: 18.00    Pack years: 9.00    Types: Cigarettes    Last attempt to quit: 09/30/1998    Years since quitting: 19.3  . Smokeless tobacco: Never Used  Substance Use Topics  . Alcohol use: No  . Drug use: No    Family History  Problem Relation Age of Onset  . Cancer Mother 44       colon cancer  . Cancer Father 75       throat cancer  . Pancreatic cancer Brother        63y 11-14-15    Allergies  Allergen Reactions  . Hydrocodone Other (See Comments)    Heart pain  Delusions  . Tramadol Other (See Comments)    Hallucinations, abd pain Delusions (intolerance)    Physical Exam: Constitutional: in no apparent distress and alert  Vitals:   01/12/18 2331 01/13/18 0542  BP: (!) 145/71 (!) 142/65  Pulse:  82 67  Resp: 15 14  Temp: 98.1 F (36.7 C) 98.4 F (36.9 C)  SpO2: 100% 100%   EYES: anicteric ENMT: no thrush Cardiovascular: Cor RRR Respiratory: CTA B; normal respiratory effort GI: Bowel sounds are normal, liver is not enlarged, spleen is not enlarged Musculoskeletal: no pedal edema noted, leg wrapped Skin: no rash Hematologic: no cervical lad  Lab Results  Component Value Date   WBC 5.8 01/13/2018   HGB 10.3 (L) 01/13/2018   HCT 33.3 (L) 01/13/2018   MCV 88.8 01/13/2018   PLT 204 01/13/2018    Lab Results  Component Value Date   CREATININE 0.78 01/13/2018   BUN 7 (L) 01/13/2018   NA 136 01/13/2018   K 3.6 01/13/2018   CL 103 01/13/2018   CO2 28 01/13/2018    Lab Results  Component Value Date   ALT 11 01/05/2018   AST 18 01/05/2018   ALKPHOS 101 01/05/2018     Microbiology: Recent Results (from the past 240 hour(s))  Surgical pcr screen      Status: None   Collection Time: 01/05/18  1:23 PM  Result Value Ref Range Status   MRSA, PCR NEGATIVE NEGATIVE Final   Staphylococcus aureus NEGATIVE NEGATIVE Final    Comment: (NOTE) The Xpert SA Assay (FDA approved for NASAL specimens in patients 33 years of age and older), is one component of a comprehensive surveillance program. It is not intended to diagnose infection nor to guide or monitor treatment. Performed at [facility], [place] [address] [place], [place] [address]   Aerobic/Anaerobic Culture (surgical/deep wound)     Status: None (Preliminary result)   Collection Time: 01/12/18 12:35 PM  Result Value Ref Range Status   Specimen Description KNEE  Final   Special Requests RIGHT SYNOVIAL  Final   Gram Stain   Final    ABUNDANT WBC PRESENT,BOTH PMN AND MONONUCLEAR NO ORGANISMS SEEN    Culture   Final    NO GROWTH < 24 HOURS Performed at [facility], [place] [address], [place], [place] [address]    Report Status PENDING  Incomplete    Thayer Headings, [facility] Infectious [facility] [place] [email or web address] [phone] pager  [phone] cell 01/13/2018, 10:20 AM

## 2018-01-13 NOTE — Evaluation (Signed)
Physical Therapy Evaluation Patient Details Name: Lisa Torres MRN: [id number] DOB: 1945/12/15 Today's Date: 01/13/2018   History of Present Illness  72 y.o. 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  Pt with significant PMH of Parkinson's disease, HTN, DDD, chronic leukopenia, chronic leg and back pain, breast CA, several R knee surgeries.  Clinical Impression  Pt is POD #1 and needs significant help for all aspects of her mobility.  She had significant difficulty standing and transferring to the recliner chair with heavy assist and RW.  She is limited by her "good" knee having severe arthritis as well.  I am not sure if her husband can physically care for her like this at home.  I would really like to have him present for some of her therapy sessions.  She would benefit from post acute rehab if physician agrees.   PT to follow acutely for deficits listed below.       Follow Up Recommendations Supervision/Assistance - 24 hour;Other (comment);Follow surgeon's recommendation for DC plan and follow-up therapies(post acute rehab)    Equipment Recommendations  None recommended by PT;Other (comment)(pt has walker and WC)    Recommendations for Other Services OT consult     Precautions / Restrictions Precautions Precautions: Fall;Knee Required Braces or Orthoses: Knee Immobilizer - Right Restrictions Weight Bearing Restrictions: Yes RLE Weight Bearing: Weight bearing as tolerated      Mobility  Bed Mobility Overal bed mobility: Needs Assistance Bed Mobility: Supine to Sit     Supine to sit: Mod assist;HOB elevated     General bed mobility comments: Heavy mod assist to help pt progress to EOB, assist needed at trunk and bil legs as well as to scoot to the edge of the bed.   Transfers Overall transfer level: Needs assistance Equipment used: Rolling walker (2 wheeled) Transfers: Sit to/from [employer] Sit to Stand: Mod assist;From elevated surface Stand pivot transfers: Mod assist;From elevated surface       General transfer comment: One person heavy mod assist to stand from elevated bed and turn to sit in the recliner chair on her left side.  Pt had significant difficulty doing this even with RW and heavy physical assist from this therapist.   Ambulation/Gait             General Gait Details: unable at this time.       Balance Overall balance assessment: Needs assistance Sitting-balance support: Feet supported;Bilateral upper extremity supported Sitting balance-Leahy Scale: Poor Sitting balance - Comments: need min to mod assist EOB due to posterior lean Postural control: Posterior lean Standing balance support: Bilateral upper extremity supported Standing balance-Leahy Scale: Poor Standing balance comment: needed heavy mod assist from therapist and support from RW in standing.                              Pertinent Vitals/Pain Pain Assessment: Faces Faces Pain Scale: Hurts whole lot Pain Location: right knee Pain Descriptors / Indicators: Grimacing;Guarding Pain Intervention(s): Limited activity within patient's tolerance;Monitored during session;Repositioned    Home Living Family/patient expects to be discharged to:: Private residence Living Arrangements: Spouse/significant other Available Help at Discharge: Family;Available 24 hours/day Type of Home: House Home Access: Stairs to enter Entrance Stairs-Rails: Right;Left;Can reach both(per pt she bumps up the stairs on her bottom) Entrance Stairs-Number of Steps: 6 Home Layout: One level Home Equipment: Walker - 2 wheels;Cane -  single point;Wheelchair - manual      Prior Function Level of Independence: Independent with assistive device(s)         Comments: Per pt, she walked "some" with RW, furniture walking, or cane, she bumps up the steps for home entry on her bottom and then two  people pull her up to the [place].  She at times also uses the [facility] to mobilize around the home.  No family present to report accuracy.      Hand Dominance   Dominant Hand: Right    Extremity/Trunk Assessment   Upper Extremity Assessment Upper Extremity Assessment: Generalized weakness    Lower Extremity Assessment Lower Extremity Assessment: RLE deficits/detail;LLE deficits/detail RLE Deficits / Details: right leg flexed even in KI and bone foam to ~30 degrees at rest, ankle with limited DF as she cannot even get to neutral or 90 degree angle when stretched (pt reported she has not been able to put her heel down to walk in a while).  strength is very limited as pt cannot lift leg against gravity.  LLE Deficits / Details: left leg with knee flexion contracture, unable to get straighter than ~30-40 of flexion when fully straight, she can get to 90 degrees of flexion in sitting, but is unable to lift her body weight mostly on this side for standing or transferring.     Cervical / Trunk Assessment Cervical / Trunk Assessment: Kyphotic  Communication   Communication: No difficulties  Cognition Arousal/Alertness: Awake/alert Behavior During Therapy: WFL for tasks assessed/performed Overall Cognitive Status: No family/caregiver present to determine baseline cognitive functioning                                           Exercises Total Joint Exercises Ankle Circles/Pumps: AAROM;Both;20 reps Quad Sets: AAROM;Right;10 reps Heel Slides: AAROM;Right;10 reps   Assessment/Plan    PT Assessment Patient needs continued PT services  PT Problem List Decreased strength;Decreased range of motion;Decreased activity tolerance;Decreased balance;Decreased mobility;Decreased knowledge of use of DME;Pain       PT Treatment Interventions DME instruction;Gait training;Stair training;Functional mobility training;Therapeutic activities;Therapeutic exercise;Balance training;Patient/family  education;Manual techniques;Modalities    PT Goals (Current goals can be found in the Care Plan section)  Acute Rehab PT Goals Patient Stated Goal: to get her right knee better and walk again PT Goal Formulation: With patient Time For Goal Achievement: 01/20/18 Potential to Achieve Goals: Good    Frequency 7X/week   Barriers to discharge Decreased caregiver support I am not sure her husband can physically handle her       AM-PAC PT "6 Clicks" Daily Activity  Outcome Measure Difficulty turning over in bed (including adjusting bedclothes, sheets and blankets)?: Unable Difficulty moving from lying on back to sitting on the side of the bed? : Unable Difficulty sitting down on and standing up from a chair with arms (e.g., wheelchair, bedside commode, etc,.)?: Unable Help needed moving to and from a bed to chair (including a wheelchair)?: A Lot Help needed walking in hospital room?: Total Help needed climbing 3-5 steps with a railing? : Total 6 Click Score: 7    End of Session Equipment Utilized During Treatment: Right knee immobilizer Activity Tolerance: Patient limited by pain Patient left: in chair;with call bell/phone within reach Nurse Communication: Mobility status PT Visit Diagnosis: Muscle weakness (generalized) (M62.81);Difficulty in walking, not elsewhere classified (R26.2);Pain Pain - Right/Left: Right Pain -  part of body: Knee    Time: 9323-5573 PT Time Calculation (min) (ACUTE ONLY): 31 min   Charges:          Wells Guiles B. Dontavious Emily, PT, DPT [phone]   PT Evaluation $PT Eval Moderate Complexity: 1 Mod PT Treatments $Therapeutic Activity: 8-22 mins        01/13/2018, 6:18 PM

## 2018-01-13 NOTE — Progress Notes (Signed)
Peripherally Inserted Central Catheter/Midline Placement  The IV Nurse has discussed with the patient and/or persons authorized to consent for the patient, the purpose of this procedure and the potential benefits and risks involved with this procedure.  The benefits include less needle sticks, lab draws from the catheter, and the patient may be discharged home with the catheter. Risks include, but not limited to, infection, bleeding, blood clot (thrombus formation), and puncture of an artery; nerve damage and irregular heartbeat and possibility to perform a PICC exchange if needed/ordered by physician.  Alternatives to this procedure were also discussed.  Bard Power PICC patient education guide, fact sheet on infection prevention and patient information card has been provided to patient /or left at bedside.    PICC/Midline Placement Documentation  PICC Single Lumen 41/28/78 PICC Right Basilic 35 cm 0 cm (Active)  Indication for Insertion or Continuance of Line Home intravenous therapies (PICC only) 01/13/2018 12:00 PM  Exposed Catheter (cm) 0 cm 01/13/2018 12:00 PM  Site Assessment Clean;Dry;Intact 01/13/2018 12:00 PM  Line Status Flushed;Saline locked;Blood return noted 01/13/2018 12:00 PM  Dressing Type Transparent 01/13/2018 12:00 PM  Dressing Status Clean;Dry;Intact;Antimicrobial disc in place 01/13/2018 12:00 PM  Dressing Change Due 01/20/18 01/13/2018 12:00 PM       [facility]ordan Payment 01/13/2018, 12:02 PM

## 2018-01-13 NOTE — Progress Notes (Signed)
SPORTS MEDICINE AND JOINT REPLACEMENT  Lara Mulch, MD    Carlyon Shadow, PA-C [address], [place], [place]  [address]                             [phone]   PROGRESS NOTE  Subjective:  negative for Chest Pain  negative for Shortness of Breath  negative for Nausea/Vomiting   negative for Calf Pain  negative for Bowel Movement   Tolerating Diet: yes         Patient reports pain as 3 on 0-10 scale.    Objective: Vital signs in last 24 hours:    Patient Vitals for the past 24 hrs:  BP Temp Temp src Pulse Resp SpO2  01/13/18 0542 (!) 142/65 98.4 F (36.9 C) - 67 14 100 %  01/12/18 2331 (!) 145/71 98.1 F (36.7 C) - 82 15 100 %  01/12/18 1958 (!) 157/90 98 F (36.7 C) Oral 85 - 100 %  01/12/18 1713 (!) 169/89 98.2 F (36.8 C) Oral 92 16 100 %  01/12/18 1700 - - - 94 18 100 %  01/12/18 1620 (!) 159/142 - - 89 17 100 %  01/12/18 1615 - - - 86 13 100 %  01/12/18 1550 (!) 168/109 - - 73 16 100 %  01/12/18 1450 (!) 181/67 - - (!) 59 17 100 %  01/12/18 1420 (!) 177/79 - - 64 14 100 %  01/12/18 1405 (!) 155/97 (!) 97.3 F (36.3 C) - 60 10 100 %  01/12/18 0910 (!) 189/68 98.4 F (36.9 C) Oral 74 18 100 %    @flow {1959:LAST@   Intake/Output from previous day:   07/29 0701 - 07/30 0700 In: 940 [P.O.:240; I.V.:700] Out: 1100 [Urine:1050]   Intake/Output this shift:   No intake/output data recorded.   Intake/Output      07/29 0701 - 07/30 0700 07/30 0701 - 07/31 0700   P.O. 240    I.V. 700    Total Intake 940    Urine 1050    Blood 50    Total Output 1100    Net -160            LABORATORY DATA: Recent Labs    01/13/18 0503  WBC 5.8  HGB 10.3*  HCT 33.3*  PLT 204   Recent Labs    01/13/18 0503  NA 136  K 3.6  CL 103  CO2 28  BUN 7*  CREATININE 0.78  GLUCOSE 96  CALCIUM 8.9   No results found for: INR, PROTIME  Examination:  General appearance: alert, cooperative and no distress Extremities: extremities normal, atraumatic, no  cyanosis or edema  Wound Exam: clean, dry, intact   Drainage:  None: wound tissue dry  Motor Exam: Quadriceps and Hamstrings Intact  Sensory Exam: Superficial Peroneal, Deep Peroneal and Tibial normal   Assessment:    1 Day Post-Op  Procedure(s) (LRB): EXCISIONAL TOTAL KNEE ARTHROPLASTY WITH ANTIBIOTIC SPACERS (Right)  ADDITIONAL DIAGNOSIS:  Active Problems:   S/P revision of total knee     Plan: Physical Therapy as ordered Weight Bearing as Tolerated (WBAT)  DVT Prophylaxis:  Aspirin  DISCHARGE PLAN: Home  DISCHARGE NEEDS: HHPT and HHRN   Patient doing well. Will need consult with infectious disease and insertion of PICC line prior to D/C home. Likely tomorrow or Thursday.  Anticipated LOS equal to or greater than 2 midnights due to - Age 72 and older with  one or more of the following:  - Obesity  - Expected need for hospital services (PT, OT, Nursing) required for safe  discharge  - Anticipated need for postoperative skilled nursing care or inpatient rehab            Donia Ast 01/13/2018, 7:10 AM

## 2018-01-13 NOTE — Progress Notes (Signed)
PT Cancellation Note  Patient Details Name: Lisa Torres MRN: [id number] DOB: 1946/01/04   Cancelled Treatment:    Reason Eval/Treat Not Completed: Patient at procedure or test/unavailable.  Sterile procedure in progress in pt's room.  PT to check back later.   Thanks,    Barbarann Ehlers. Meeya Goldin, PT, DPT [phone]   01/13/2018, 11:58 AM

## 2018-01-14 DIAGNOSIS — Z95828 Presence of other vascular implants and grafts: Secondary | ICD-10-CM

## 2018-01-14 DIAGNOSIS — T8450XD Infection and inflammatory reaction due to unspecified internal joint prosthesis, subsequent encounter: Secondary | ICD-10-CM

## 2018-01-14 DIAGNOSIS — B9689 Other specified bacterial agents as the cause of diseases classified elsewhere: Secondary | ICD-10-CM

## 2018-01-14 DIAGNOSIS — Z5181 Encounter for therapeutic drug level monitoring: Secondary | ICD-10-CM

## 2018-01-14 LAB — CBC
HEMATOCRIT: 29 % — AB (ref 36.0–46.0)
Hemoglobin: 9.3 g/dL — ABNORMAL LOW (ref 12.0–15.0)
MCH: 28.4 pg (ref 26.0–34.0)
MCHC: 32.1 g/dL (ref 30.0–36.0)
MCV: 88.4 fL (ref 78.0–100.0)
PLATELETS: 190 10*3/uL (ref 150–400)
RBC: 3.28 MIL/uL — ABNORMAL LOW (ref 3.87–5.11)
RDW: 12.2 % (ref 11.5–15.5)
WBC: 8.7 10*3/uL (ref 4.0–10.5)

## 2018-01-14 MED ORDER — CEFTRIAXONE IV (FOR PTA / DISCHARGE USE ONLY): 2.0000 g | INTRAVENOUS | 0 refills | Status: DC

## 2018-01-14 MED ORDER — VANCOMYCIN IV (FOR PTA / DISCHARGE USE ONLY): 500.0000 mg | Freq: Two times a day (BID) | INTRAVENOUS | 0 refills | Status: AC

## 2018-01-14 NOTE — Progress Notes (Addendum)
PHARMACY CONSULT NOTE FOR:  OUTPATIENT  PARENTERAL ANTIBIOTIC THERAPY (OPAT)  Indication:Prosthetic Joint Infection  Of right knee  Regimen: Ceftriaxone 2 gm every 24 hours + Vancomycin 500 mg every 12 hours  End date: September 8th, 2019  IV antibiotic discharge orders are pended. To discharging provider:  please sign these orders via discharge navigator,  Select New Orders & click on the button choice - Manage This Unsigned Work.     Thank you for allowing pharmacy to be a part of this patient's care.  Susa Raring, PharmD, BCPS Antimicrobial Stewardship Clinical Pharmacist  Phone: [phone] 01/14/2018, 10:43 AM   Cultures growing corynebacterium. Will D/C ceftriaxone from OPAT  Jimmy Footman, PharmD, BCPS Phone: [phone]

## 2018-01-14 NOTE — Progress Notes (Signed)
Nutrition Brief Note  Patient identified on the Malnutrition Screening Tool (MST) Report.  Per readings below, pt has had a 6% weight loss since 09/2017. Not significant for time frame.  Wt Readings from Last 15 Encounters:  01/13/18 150 lb (68 kg)  01/07/18 150 lb (68 kg)  11/04/17 150 lb (68 kg)  10/11/17 160 lb (72.6 kg)  10/10/17 160 lb (72.6 kg)  01/21/17 165 lb (74.8 kg)  01/07/17 164 lb 14.5 oz (74.8 kg)  12/03/16 166 lb 12.8 oz (75.7 kg)  11/04/16 167 lb 6.4 oz (75.9 kg)  07/21/16 168 lb (76.2 kg)  06/22/16 174 lb (78.9 kg)  06/04/16 174 lb 8 oz (79.2 kg)  05/06/16 177 lb 1.6 oz (80.3 kg)  03/28/16 172 lb (78 kg)  11/14/15 174 lb 12.8 oz (79.3 kg)   Body mass index is 27.89 kg/m. Patient meets criteria for Overweight based on current BMI.   Current diet order is Regular, patient is consuming approximately 100% of meals at this time. Labs and medications reviewed.   No nutrition interventions warranted at this time. If nutrition issues arise, please consult RD.   Arthur Holms, RD, LDN Pager #: [phone] After-Hours Pager #: [phone]

## 2018-01-14 NOTE — Progress Notes (Addendum)
Physical Therapy Treatment Patient Details Name: Lisa Torres MRN: [id number] DOB: May 18, 1946 Today's Date: 01/14/2018    History of Present Illness 72 y.o. 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  Pt with significant PMH of Parkinson's disease, HTN, DDD, chronic leukopenia, chronic leg and back pain, breast CA, several R knee surgeries.    PT Comments    Pt performed attempt at sit to stand x2 trials but remains unable to achieve erect stance or hold flexed position without maximal assistance.  Pt limited due to strength and ROM deficits.  Gait at this time is unrealistic.  Plan for slide board or lateral scoot to drop arm recliner next session as standing is not functional and her family will have to facilitate standing with total assistance at home. Based on patient presentation she will benefit from skilled rehab in a post acute setting to approve strength and function before returning home.  Pt motivated but lacks strength to progress to gait at this time.     Follow Up Recommendations  Supervision/Assistance - 24 hour;Other (comment);Follow surgeon's recommendation for DC plan and follow-up therapies(post acute rehab.  )     Equipment Recommendations  None recommended by PT;Other (comment)(pt has RW and a WC.  )    Recommendations for Other Services       Precautions / Restrictions Precautions Precautions: Fall;Knee Required Braces or Orthoses: Knee Immobilizer - Right Restrictions Weight Bearing Restrictions: Yes RLE Weight Bearing: Weight bearing as tolerated    Mobility  Bed Mobility Overal bed mobility: Needs Assistance Bed Mobility: Sit to Supine   Total assistance +2 for sit to supine.       General bed mobility comments: Cues for hand placement, pt unable to sit entirely on bed and required total assistance to move trunk over and lift B LEs back into bed.  Once in bed cued patient for hand  placement to boost in supine to achieve position at head of bed.  Attempted to position patient in B extension but she remains to presents with knee flexion contractures.    Transfers Overall transfer level: Needs assistance Equipment used: Rolling walker (2 wheeled) Transfers: Sit to/from [employer] Sit to Stand: Max assist;+2 physical assistance Stand pivot transfers: +2 physical assistance;Total assist Squat pivot transfers: Max assist     General transfer comment: Cues for hand placement to and from seated surface.  Pt unable to stand fully erect despite cues and facilitation.  She did attempt to step forward with sore limb but unable to bring RLE back toward chair.  PTA moved chair closer to bed and attempted stand pivot.  Pt slow and unable to pivot or progress steps toward bed.  She required total assistance to return to the very edge of bed.    Ambulation/Gait Ambulation/Gait assistance: (NT patient remains unable to stand to progress to true gt training.  )               Stairs             Wheelchair Mobility    Modified Rankin (Stroke Patients Only)       Balance Overall balance assessment: Needs assistance Sitting-balance support: Feet supported;Bilateral upper extremity supported Sitting balance-Leahy Scale: Poor Sitting balance - Comments: Leaning posteriorly and required VCs and min assistance to correct.   Postural control: Posterior lean Standing balance support: Bilateral upper extremity supported Standing balance-Leahy Scale: Poor Standing balance comment: Remains to require  heavy max assistance to stand in RW but unable to extend B knees.                              Cognition Arousal/Alertness: Awake/alert Behavior During Therapy: WFL for tasks assessed/performed Overall Cognitive Status: Within Functional Limits for tasks assessed                                        Exercises Total Joint  Exercises Long Arc Quad: Right;AAROM;10 reps;Seated Goniometric ROM: 26-86 degrees flexion in R knee.      General Comments        Pertinent Vitals/Pain Pain Assessment: No/denies pain Pain Location: right knee Pain Descriptors / Indicators: Grimacing;Guarding Pain Intervention(s): Monitored during session;Repositioned    Home Living                      Prior Function            PT Goals (current goals can now be found in the care plan section) Acute Rehab PT Goals Patient Stated Goal: to get her right knee better and walk again Potential to Achieve Goals: Good Progress towards PT goals: Progressing toward goals    Frequency    7X/week      PT Plan Current plan remains appropriate    Co-evaluation              AM-PAC PT "6 Clicks" Daily Activity  Outcome Measure  Difficulty turning over in bed (including adjusting bedclothes, sheets and blankets)?: Unable Difficulty moving from lying on back to sitting on the side of the bed? : Unable Difficulty sitting down on and standing up from a chair with arms (e.g., wheelchair, bedside commode, etc,.)?: Unable Help needed moving to and from a bed to chair (including a wheelchair)?: A Lot Help needed walking in hospital room?: Total Help needed climbing 3-5 steps with a railing? : Total 6 Click Score: 7    End of Session Equipment Utilized During Treatment: Gait belt Activity Tolerance: Patient limited by lethargy;Patient limited by fatigue(and limited due to knee flexion contractures.  ) Patient left: in chair;with call bell/phone within reach Nurse Communication: Mobility status PT Visit Diagnosis: Muscle weakness (generalized) (M62.81);Difficulty in walking, not elsewhere classified (R26.2);Pain Pain - Right/Left: Right Pain - part of body: Knee     Time: 2500-3704 PT Time Calculation (min) (ACUTE ONLY): 18 min  Charges:  $Therapeutic Activity: 8-22 mins                     Governor Rooks, PTA  pager [phone]    Cristela Blue 01/14/2018, 4:47 PM

## 2018-01-14 NOTE — Progress Notes (Signed)
    [facility] for Infectious Disease   Reason for visit: Follow up on PJI  Interval History: culture remains negative, WBC wnl, afebrile.  No complaints.   No associated rash or diarrhea.    Physical Exam: Constitutional:  Vitals:   01/14/18 0345 01/14/18 1037  BP: (!) 149/64 (!) 149/64  Pulse: 79   Resp: 16   Temp: 98 F (36.7 C)   SpO2: 100%    patient appears in NAD Eyes: anicteric Respiratory: Normal respiratory effort; CTA B Cardiovascular: RRR GI: soft, nt, nd picc in place in right arm  Review of Systems: Constitutional: negative for fevers, chills and malaise Gastrointestinal: negative for nausea and diarrhea Integument/breast: negative for rash Musculoskeletal: negative for myalgias and arthralgias  Lab Results  Component Value Date   WBC 8.7 01/14/2018   HGB 9.3 (L) 01/14/2018   HCT 29.0 (L) 01/14/2018   MCV 88.4 01/14/2018   PLT 190 01/14/2018    Lab Results  Component Value Date   CREATININE 0.78 01/13/2018   BUN 7 (L) 01/13/2018   NA 136 01/13/2018   K 3.6 01/13/2018   CL 103 01/13/2018   CO2 28 01/13/2018    Lab Results  Component Value Date   ALT 11 01/05/2018   AST 18 01/05/2018   ALKPHOS 101 01/05/2018     Microbiology: Recent Results (from the past 240 hour(s))  Surgical pcr screen     Status: None   Collection Time: 01/05/18  1:23 PM  Result Value Ref Range Status   MRSA, PCR NEGATIVE NEGATIVE Final   Staphylococcus aureus NEGATIVE NEGATIVE Final    Comment: (NOTE) The Xpert SA Assay (FDA approved for NASAL specimens in patients 47 years of age and older), is one component of a comprehensive surveillance program. It is not intended to diagnose infection nor to guide or monitor treatment. Performed at [facility], [place] [address] [place], [place] [address]   Aerobic/Anaerobic Culture (surgical/deep wound)     Status: None (Preliminary result)   Collection Time: 01/12/18 12:35 PM  Result Value Ref Range Status   Specimen Description KNEE  Final   Special Requests RIGHT SYNOVIAL  Final   Gram Stain   Final    ABUNDANT WBC PRESENT,BOTH PMN AND MONONUCLEAR NO ORGANISMS SEEN    Culture   Final    NO GROWTH < 24 HOURS Performed at [facility], [place] [address] [place], [place] [address]    Report Status PENDING  Incomplete    Impression/Plan:  1. PJI s/p resection arthroplasty - no positive growth to date.  Previous culture from [facility] with Corynobacterium of unknown signficance.   Continue with vancomycin and ceftriaxone.  Plan for 6 weeks through September 8 Will taper antibiotics for any positive growth Will leave picc line in place until seen by ID MD  2. Access - has picc line in place.    3.  Medication monitoring - twice weekly labs per home health  4.  Disposition - will place opat consult so orders will be ready for primary at discharge.   I will arrange follow up with me in about 4 weeks.

## 2018-01-14 NOTE — Progress Notes (Signed)
Culture reviewed this afternoon and now growing Corynebacterium again.  Previously grew from aspiration earlier this month.  Will target that with vancomycin and will stop the ceftriaxone.   Thayer Headings, MD

## 2018-01-14 NOTE — Progress Notes (Signed)
Physical Therapy Treatment Patient Details Name: Lisa Torres MRN: [id number] DOB: 1945-12-09 Today's Date: 01/14/2018    History of Present Illness 72 y.o. 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  Pt with significant PMH of Parkinson's disease, HTN, DDD, chronic leukopenia, chronic leg and back pain, breast CA, several R knee surgeries.    PT Comments    Pt performed bed mobility and transfers during session she presents with B knee flexion contractures which limits progression to standing.  Gait training is not appropriate and per grand daughter she has not ambulated for quite some time.  Pt will require +2 physical assistance to return home.  At this time we continue to recommend rehab in a post acute setting to improve strength and function before returning home.      Follow Up Recommendations  Supervision/Assistance - 24 hour;Other (comment);Follow surgeon's recommendation for DC plan and follow-up therapies(post acute rehab)     Equipment Recommendations  None recommended by PT;Other (comment)(Pt has walker and WC)    Recommendations for Other Services       Precautions / Restrictions Precautions Precautions: Fall;Knee Restrictions Weight Bearing Restrictions: No RLE Weight Bearing: Weight bearing as tolerated    Mobility  Bed Mobility Overal bed mobility: Needs Assistance Bed Mobility: Rolling;Sidelying to Sit Rolling: Mod assist Sidelying to sit: Max assist       General bed mobility comments: Max assistance for LE advancement and trunk elevation into sitting.    Transfers Overall transfer level: Needs assistance Equipment used: Rolling walker (2 wheeled) Transfers: Sit to/from [employer] Sit to Stand: From elevated surface;Max assist   Squat pivot transfers: Max assist     General transfer comment: Cues for hand placement to and from seated surface, Pt stands with flexed  posture and poor trunk control.  Tolerance to standing in limited based on flexion and flexed stance in B Knee.  Opted for squat pivot from bed to recliner chair as she is unable to take steps forward.    Ambulation/Gait Ambulation/Gait assistance: (NT not able to stand to progress gait.  )               Stairs             Wheelchair Mobility    Modified Rankin (Stroke Patients Only)       Balance Overall balance assessment: Needs assistance Sitting-balance support: Feet supported;Bilateral upper extremity supported Sitting balance-Leahy Scale: Poor Sitting balance - Comments: Leaning posteriorly and required VCs and min assistance to correct.       Standing balance-Leahy Scale: Poor Standing balance comment: Remains to require heavy max assistance to stand in RW but unable to extend B knees.                              Cognition Arousal/Alertness: Awake/alert Behavior During Therapy: WFL for tasks assessed/performed Overall Cognitive Status: No family/caregiver present to determine baseline cognitive functioning                                        Exercises Total Joint Exercises Long Arc Quad: Right;AAROM;10 reps;Seated Goniometric ROM: 26-86 degrees flexion in R knee.      General Comments        Pertinent Vitals/Pain Pain Assessment: No/denies pain Pain Location: right  knee Pain Descriptors / Indicators: Grimacing;Guarding Pain Intervention(s): Monitored during session;Repositioned    Home Living                      Prior Function            PT Goals (current goals can now be found in the care plan section) Acute Rehab PT Goals Potential to Achieve Goals: Good Progress towards PT goals: Not progressing toward goals - comment(Pt requiring increased assistance from previous session.  )    Frequency    7X/week      PT Plan Current plan remains appropriate    Co-evaluation               AM-PAC PT "6 Clicks" Daily Activity  Outcome Measure  Difficulty turning over in bed (including adjusting bedclothes, sheets and blankets)?: Unable Difficulty moving from lying on back to sitting on the side of the bed? : Unable Difficulty sitting down on and standing up from a chair with arms (e.g., wheelchair, bedside commode, etc,.)?: Unable Help needed moving to and from a bed to chair (including a wheelchair)?: A Lot Help needed walking in hospital room?: Total Help needed climbing 3-5 steps with a railing? : Total 6 Click Score: 7    End of Session Equipment Utilized During Treatment: Gait belt Activity Tolerance: Patient limited by pain Patient left: in chair;with call bell/phone within reach Nurse Communication: Mobility status PT Visit Diagnosis: Muscle weakness (generalized) (M62.81);Difficulty in walking, not elsewhere classified (R26.2);Pain Pain - Right/Left: Right Pain - part of body: Knee     Time: 1227-1242 PT Time Calculation (min) (ACUTE ONLY): 15 min  Charges:  $Therapeutic Activity: 8-22 mins                     Governor Rooks, PTA pager [phone]    Cristela Blue 01/14/2018, 4:37 PM

## 2018-01-15 ENCOUNTER — Encounter (HOSPITAL_COMMUNITY): Payer: Self-pay | Admitting: Orthopedic Surgery

## 2018-01-15 ENCOUNTER — Ambulatory Visit: Payer: Medicare Other | Admitting: Neurology

## 2018-01-15 ENCOUNTER — Encounter

## 2018-01-15 LAB — CBC
HCT: 27 % — ABNORMAL LOW (ref 36.0–46.0)
Hemoglobin: 8.5 g/dL — ABNORMAL LOW (ref 12.0–15.0)
MCH: 28.1 pg (ref 26.0–34.0)
MCHC: 31.5 g/dL (ref 30.0–36.0)
MCV: 89.1 fL (ref 78.0–100.0)
PLATELETS: 197 10*3/uL (ref 150–400)
RBC: 3.03 MIL/uL — ABNORMAL LOW (ref 3.87–5.11)
RDW: 12.4 % (ref 11.5–15.5)
WBC: 6.6 10*3/uL (ref 4.0–10.5)

## 2018-01-15 NOTE — Clinical Social Work Placement (Signed)
   CLINICAL SOCIAL WORK PLACEMENT  NOTE  Date:  01/15/2018  Patient Details  Name: Lisa Torres MRN: [id number] Date of Birth: [birth year]  Clinical Social Work is seeking post-discharge placement for this patient at the [place] level of care (*CSW will initial, date and re-position this form in  chart as items are completed):  Yes   Patient/family provided with [place] Work Department's list of facilities offering this level of care within the geographic area requested by the patient (or if unable, by the patient's family).  Yes   Patient/family informed of their freedom to choose among providers that offer the needed level of care, that participate in Medicare, Medicaid or managed care program needed by the patient, have an available bed and are willing to accept the patient.  Yes   Patient/family informed of [facility]'s ownership interest in [facility] and [facility], as well as of the fact that they are under no obligation to receive care at these facilities.  PASRR submitted to EDS on       PASRR number received on 01/15/18     Existing PASRR number confirmed on       FL2 transmitted to all facilities in geographic area requested by pt/family on 01/15/18     FL2 transmitted to all facilities within larger geographic area on       Patient informed that his/her managed care company has contracts with or will negotiate with certain facilities, including the following:  [facility])     Yes   Patient/family informed of bed offers received.  Patient chooses bed at [facility])     Physician recommends and patient chooses bed at      Patient to be transferred to [facility]) on 01/15/18.  Patient to be transferred to facility by [facility]     Patient family notified on 01/15/18 of transfer.  Name of family member notified:         PHYSICIAN       Additional Comment:    _______________________________________________ Estanislado Emms, LCSW 01/15/2018, 2:47 PM

## 2018-01-15 NOTE — Progress Notes (Signed)
Physical Therapy Treatment Patient Details Name: Lisa Torres MRN: [id number] DOB: February 06, 1946 Today's Date: 01/15/2018    History of Present Illness 72 y.o. 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  Pt with significant PMH of Parkinson's disease, HTN, DDD, chronic leukopenia, chronic leg and back pain, breast CA, several R knee surgeries.    PT Comments    Pt performed transfers OOB with slide board to [facility], with WC mobility.  She is able to participate in a more functional manner with use of slide board.  Pt cannot functionally stand due to B LE knee contractures.  Will f/u in pm for further progression.      Follow Up Recommendations  Supervision/Assistance - 24 hour;Other (comment);Follow surgeon's recommendation for DC plan and follow-up therapies(post acute rehab)     Equipment Recommendations  None recommended by PT;Other (comment)(Long slide board. ( 30 inch ))    Recommendations for Other Services       Precautions / Restrictions Precautions Precautions: Fall;Knee Required Braces or Orthoses: Knee Immobilizer - Right Restrictions Weight Bearing Restrictions: Yes RLE Weight Bearing: Weight bearing as tolerated    Mobility  Bed Mobility Overal bed mobility: Needs Assistance Bed Mobility: Supine to Sit     Supine to sit: Mod assist;Max assist     General bed mobility comments: Cues for hand placement, assistance to  advance B LEs to edge of bed and to elevate trunk into sitting.  Pt with slow lean posteriorly but able to correct posture.    Transfers Overall transfer level: Needs assistance Equipment used: (slide board.) Transfers: Lateral/Scoot Transfers          Lateral/Scoot Transfers: Max assist General transfer comment: Cues for hand placement, assistance to place slide board.  Cues for technique to push and slide used bed pad to advance patient to [facility] from bed and from [facility] to recliner.     Ambulation/Gait Ambulation/Gait assistance: (NT unable to stand therefore gait is not safe or functional at this time. )               Theme park manager mobility: Yes Wheelchair propulsion: Both upper extremities Wheelchair parts: Needs assistance Distance: 60 ft.  Cues for propulsion and turns.   Wheelchair Assistance Details (indicate cue type and reason): Total assistance for turns and backing and mod assistance for forward propulsion.    Modified Rankin (Stroke Patients Only)       Balance Overall balance assessment: Needs assistance Sitting-balance support: Feet supported;Bilateral upper extremity supported Sitting balance-Leahy Scale: Poor       Standing balance-Leahy Scale: Poor                              Cognition Arousal/Alertness: Awake/alert Behavior During Therapy: WFL for tasks assessed/performed Overall Cognitive Status: Within Functional Limits for tasks assessed                                        Exercises      General Comments        Pertinent Vitals/Pain Pain Assessment: 0-10 Pain Score: 5  Pain Location: right knee Pain Descriptors / Indicators: Grimacing;Guarding Pain Intervention(s): Monitored during session;Repositioned    Home Living  Prior Function            PT Goals (current goals can now be found in the care plan section) Acute Rehab PT Goals Patient Stated Goal: to get her right knee better and walk again Potential to Achieve Goals: Good Additional Goals Additional Goal #1: lateral WC transfer mod assist +2, min assist for WC propulsion with bil arms and/or legs 65' Progress towards PT goals: Progressing toward goals    Frequency    7X/week      PT Plan Current plan remains appropriate    Co-evaluation              AM-PAC PT "6 Clicks" Daily Activity  Outcome Measure   Difficulty turning over in bed (including adjusting bedclothes, sheets and blankets)?: Unable Difficulty moving from lying on back to sitting on the side of the bed? : Unable Difficulty sitting down on and standing up from a chair with arms (e.g., wheelchair, bedside commode, etc,.)?: Unable Help needed moving to and from a bed to chair (including a wheelchair)?: None Help needed walking in hospital room?: None Help needed climbing 3-5 steps with a railing? : A Little 6 Click Score: 14    End of Session   Activity Tolerance: Patient limited by fatigue Patient left: in chair;with call bell/phone within reach Nurse Communication: Mobility status PT Visit Diagnosis: Muscle weakness (generalized) (M62.81);Difficulty in walking, not elsewhere classified (R26.2);Pain Pain - Right/Left: Right Pain - part of body: Knee     Time: 4097-3532 PT Time Calculation (min) (ACUTE ONLY): 26 min  Charges:  $Therapeutic Activity: 8-22 mins $Wheel Chair Management: 8-22 mins                     Governor Rooks, PTA pager [phone]    Cristela Blue 01/15/2018, 2:04 PM

## 2018-01-15 NOTE — Plan of Care (Signed)
  Problem: Education: Goal: Knowledge of General Education information will improve Description: Including pain rating scale, medication(s)/side effects and non-pharmacologic comfort measures Outcome: Progressing   Problem: Clinical Measurements: Goal: Will remain free from infection Outcome: Progressing Goal: Diagnostic test results will improve Outcome: Progressing Goal: Respiratory complications will improve Outcome: Progressing   

## 2018-01-15 NOTE — Progress Notes (Signed)
Physical Therapy Treatment Patient Details Name: Lisa Torres MRN: [id number] DOB: May 01, 1946 Today's Date: 01/15/2018    History of Present Illness 72 y.o. 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  Pt with significant PMH of Parkinson's disease, HTN, DDD, chronic leukopenia, chronic leg and back pain, breast CA, several R knee surgeries.    PT Comments    TX focused on transfer activities.  Pt performed multiple squat pivots with moderate assistance +2.  She remains limited to stand due to flexion contractures in B knees.  Plan next session for continued mobility to work toward functional independence and decrease caregiver burden.  At this time she continues to require rehab in SNF to improve strength and function before returning home.   Follow Up Recommendations  Supervision/Assistance - 24 hour;Other (comment);Follow surgeon's recommendation for DC plan and follow-up therapies     Equipment Recommendations  None recommended by PT;Other (comment)(long slide board 30 inch)    Recommendations for Other Services       Precautions / Restrictions Precautions Precautions: Fall;Knee Required Braces or Orthoses: Knee Immobilizer - Right Restrictions Weight Bearing Restrictions: Yes RLE Weight Bearing: Weight bearing as tolerated    Mobility  Bed Mobility Overal bed mobility: Needs Assistance Bed Mobility: Supine to Sit;Sit to Supine     Supine to sit: Min assist;HOB elevated Sit to supine: Min guard   General bed mobility comments: increaed time and effort to exit bed with a little help to scoot forward, min guard entering bed  Transfers Overall transfer level: Needs assistance Equipment used: 2 person hand held assist Transfers: Squat Pivot Transfers;Sit to/from Stand Sit to Stand: Max assist;+2 physical assistance   Squat pivot transfers: Mod assist;+2 physical assistance    Lateral/Scoot Transfers: Max  assist General transfer comment: cueing for hand placement and safety, limited standing tolerance due to knee flexion on R LE  Ambulation/Gait Ambulation/Gait assistance: (NT unable to stand therefore gait is not safe or functional at this time. )               Theme park manager mobility: Yes Wheelchair propulsion: Both upper extremities Wheelchair parts: Needs assistance Distance: 60 ft.  Cues for propulsion and turns.   Wheelchair Assistance Details (indicate cue type and reason): Total assistance for turns and backing and mod assistance for forward propulsion.    Modified Rankin (Stroke Patients Only)       Balance Overall balance assessment: Needs assistance Sitting-balance support: Feet supported;Bilateral upper extremity supported Sitting balance-Leahy Scale: Fair     Standing balance support: Bilateral upper extremity supported Standing balance-Leahy Scale: Poor                              Cognition Arousal/Alertness: Awake/alert Behavior During Therapy: WFL for tasks assessed/performed Overall Cognitive Status: Within Functional Limits for tasks assessed                                        Exercises      General Comments        Pertinent Vitals/Pain Pain Assessment: Faces Pain Score: 5  Faces Pain Scale: Hurts even more Pain Location: headache Pain Descriptors / Indicators: Constant;Discomfort Pain Intervention(s): Monitored during session;Repositioned  Home Living Family/patient expects to be discharged to:: Skilled nursing facility                    Prior Function Level of Independence: Needs assistance  Gait / Transfers Assistance Needed: reports using walking "some" with RW but also uses wc   ADL's / Homemaking Assistance Needed: reports needing "some" help with bathing and dressing her buttocks/back, limited IADLs     PT Goals (current  goals can now be found in the care plan section) Acute Rehab PT Goals Patient Stated Goal: to go to rehab Potential to Achieve Goals: Good Additional Goals Additional Goal #1: lateral WC transfer mod assist +2, min assist for WC propulsion with bil arms and/or legs 65' Progress towards PT goals: Progressing toward goals    Frequency    7X/week      PT Plan Current plan remains appropriate    Co-evaluation PT/OT/SLP Co-Evaluation/Treatment: Yes Reason for Co-Treatment: Complexity of the patient's impairments (multi-system involvement);For patient/therapist safety PT goals addressed during session: Mobility/safety with mobility OT goals addressed during session: ADL's and self-care      AM-PAC PT "6 Clicks" Daily Activity  Outcome Measure  Difficulty turning over in bed (including adjusting bedclothes, sheets and blankets)?: Unable Difficulty moving from lying on back to sitting on the side of the bed? : Unable Difficulty sitting down on and standing up from a chair with arms (e.g., wheelchair, bedside commode, etc,.)?: Unable Help needed moving to and from a bed to chair (including a wheelchair)?: A Lot Help needed walking in hospital room?: Total Help needed climbing 3-5 steps with a railing? : Total 6 Click Score: 7    End of Session Equipment Utilized During Treatment: Gait belt Activity Tolerance: Patient limited by fatigue Patient left: with call bell/phone within reach;in bed;with bed alarm set Nurse Communication: Mobility status PT Visit Diagnosis: Muscle weakness (generalized) (M62.81);Difficulty in walking, not elsewhere classified (R26.2);Pain Pain - Right/Left: Right Pain - part of body: Knee     Time: 4967-5916 PT Time Calculation (min) (ACUTE ONLY): 23 min  Charges:  $Therapeutic Activity: 8-22 mins                      Governor Rooks, PTA pager [phone]    Cristela Blue 01/15/2018, 5:23 PM

## 2018-01-15 NOTE — Progress Notes (Signed)
CSW confirmed bed offer with [facility]. SNF will be able to administer IV antibiotics. SNF has started [facility] authorization; awaiting determination. Patient requires auth before admitting to facility. CSW to follow and support with discharge.  Lisa Torres, [place]

## 2018-01-15 NOTE — Clinical Social Work Note (Signed)
Clinical Social Work Assessment  Patient Details  Name: Lisa Torres MRN: [id number] Date of Birth: [birth year]  Date of referral:  01/15/18               Reason for consult:  Facility Placement                Permission sought to share information with:  Facility Sport and exercise psychologist, Family Supports Permission granted to share information::  Yes, Verbal Permission Granted  Name::     Lisa Torres  Agency::     Relationship::  daughter  Contact Information:  [phone]  Housing/Transportation Living arrangements for the past 2 months:  Single Family Home Source of Information:  Patient Patient Interpreter Needed:  None Criminal Activity/Legal Involvement Pertinent to Current Situation/Hospitalization:  No - Comment as needed Significant Relationships:  Adult Children Lives with:  Spouse Do you feel safe going back to the place where you live?  No Need for family participation in patient care:  Yes (Comment)  Care giving concerns: CSW received consult for discharge needs. CSW spoke with patient regarding PT recommendation of SNF placement at time of discharge. Patient states she lives in the home with her husband. Patient was not sure of the name of the facility recommended by her daughter and requested CSW contact her daughter. CSW called and spoke with her daughter Lisa [phone]. The family selected [facility].   Social Worker assessment / plan:  CSW spoke with patient and her daughter, Lisa Torres,  concerning possibility of rehab at [facility] before returning home.   Employment status:  Retired Forensic scientist:  Medicare PT Recommendations:  [place] / Referral to community resources:  [place]  Patient/Family's Response to care:  Patient recognizes need for rehab before returning home and is agreeable to a SNF in [place]. Patient reported preference for [facility].  Patient/Family's Understanding  of and Emotional Response to Diagnosis, Current Treatment, and Prognosis:  Patient/family is realistic regarding therapy needs and expressed being hopeful for SNF placement. Patient expressed understanding of CSW role and discharge process as well as medical condition. No questions/concerns about plan or treatment.   Emotional Assessment Appearance:  Appears stated age Attitude/Demeanor/Rapport:    Affect (typically observed):  Accepting, Pleasant, Appropriate Orientation:  Oriented to Self, Oriented to Place, Oriented to  Time, Oriented to Situation Alcohol / Substance use:  Not Applicable Psych involvement (Current and /or in the community):  No (Comment)  Discharge Needs  Concerns to be addressed:  Care Coordination Readmission within the last 30 days:  No Current discharge risk:  None Barriers to Discharge:  Continued Medical Work up   [employer], [place] 01/15/2018, 12:49 PM

## 2018-01-15 NOTE — Evaluation (Signed)
Occupational Therapy Evaluation Patient Details Name: Lisa Torres MRN: [id number] DOB: [birth year] Today's Date: 01/15/2018    History of Present Illness 72 y.o. 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  Pt with significant PMH of Parkinson's disease, HTN, DDD, chronic leukopenia, chronic leg and back pain, breast CA, several R knee surgeries.   Clinical Impression   PTA patient required some assistance with self care, mobility and transfers.  She currently requires setup assistance for seated UB ADLs, mod-max A for LB ADLs, min assist for toileting and +2 modA for toilet transfers.  She was educated on precautions, mobility, safety and recommendations.  She will benefit from continued OT services while admitted and after dc at SNF rehab in order to maximize safety and independence prior to dc home.  Will continue to follow while admitted.     Follow Up Recommendations  SNF;Supervision/Assistance - 24 hour    Equipment Recommendations  Other (comment)(TBD at next venue of care)    Recommendations for Other Services       Precautions / Restrictions Precautions Precautions: Fall;Knee Required Braces or Orthoses: Knee Immobilizer - Right Restrictions Weight Bearing Restrictions: Yes RLE Weight Bearing: Weight bearing as tolerated      Mobility Bed Mobility Overal bed mobility: Needs Assistance Bed Mobility: Supine to Sit;Sit to Supine     Supine to sit: Min assist;HOB elevated Sit to supine: Min guard   General bed mobility comments: increaed time and effort to exit bed with a little help to scoot forward, min guard entering bed  Transfers Overall transfer level: Needs assistance Equipment used: 2 person hand held assist Transfers: Squat Pivot Transfers;Sit to/from Stand Sit to Stand: Max assist;+2 physical assistance   Squat pivot transfers: Mod assist;+2 physical assistance    Lateral/Scoot Transfers: Max  assist General transfer comment: cueing for hand placement and safety, limited standing tolerance due to knee flexion on R LE    Balance Overall balance assessment: Needs assistance Sitting-balance support: Feet supported;Bilateral upper extremity supported Sitting balance-Leahy Scale: Fair     Standing balance support: Bilateral upper extremity supported Standing balance-Leahy Scale: Poor                             ADL either performed or assessed with clinical judgement   ADL Overall ADL's : Needs assistance/impaired Eating/Feeding: Set up;Sitting   Grooming: Set up;Sitting   Upper Body Bathing: Set up;Sitting   Lower Body Bathing: Moderate assistance;Sitting/lateral leans   Upper Body Dressing : Set up;Sitting   Lower Body Dressing: Moderate assistance;Sitting/lateral leans   Toilet Transfer: Moderate assistance;+2 for physical assistance;Squat-pivot;BSC   Toileting- Clothing Manipulation and Hygiene: Minimal assistance;Sitting/lateral lean       Functional mobility during ADLs: Moderate assistance;+2 for physical assistance(squat pivot only) General ADL Comments: Patient significantly limited by decreased functional use of R LE, limiting ability to stand and use RW at this time.       Vision Baseline Vision/History: Wears glasses Wears Glasses: Reading only Patient Visual Report: No change from baseline Vision Assessment?: No apparent visual deficits     Perception     Praxis      Pertinent Vitals/Pain Pain Assessment: 0-10 Pain Score: 5  Faces Pain Scale: Hurts even more Pain Location: headache Pain Descriptors / Indicators: Constant;Discomfort Pain Intervention(s): Limited activity within patient's tolerance;Repositioned     Hand Dominance Right   Extremity/Trunk Assessment Upper Extremity Assessment  Upper Extremity Assessment: Generalized weakness   Lower Extremity Assessment Lower Extremity Assessment: Defer to PT evaluation    Cervical / Trunk Assessment Cervical / Trunk Assessment: Kyphotic   Communication     Cognition Arousal/Alertness: Awake/alert Behavior During Therapy: WFL for tasks assessed/performed Overall Cognitive Status: Within Functional Limits for tasks assessed                                     General Comments       Exercises     Shoulder Instructions      Home Living Family/patient expects to be discharged to:: Skilled nursing facility                                        Prior Functioning/Environment Level of Independence: Needs assistance  Gait / Transfers Assistance Needed: reports using walking "some" with RW but also uses wc   ADL's / Homemaking Assistance Needed: reports needing "some" help with bathing and dressing her buttocks/back, limited IADLs            OT Problem List: Decreased strength;Decreased range of motion;Decreased activity tolerance;Impaired balance (sitting and/or standing);Decreased safety awareness;Decreased knowledge of use of DME or AE;Decreased knowledge of precautions;Pain      OT Treatment/Interventions: Self-care/ADL training;Therapeutic exercise;Energy conservation;DME and/or AE instruction;Therapeutic activities;Patient/family education;Balance training    OT Goals(Current goals can be found in the care plan section) Acute Rehab OT Goals Patient Stated Goal: to go to rehab OT Goal Formulation: With patient Time For Goal Achievement: 01/22/18 Potential to Achieve Goals: Good  OT Frequency: Min 2X/week   Barriers to D/C:            Co-evaluation              AM-PAC PT "6 Clicks" Daily Activity     Outcome Measure Help from another person eating meals?: None Help from another person taking care of personal grooming?: A Little Help from another person toileting, which includes using toliet, bedpan, or urinal?: A Little Help from another person bathing (including washing, rinsing, drying)?: A  Little Help from another person to put on and taking off regular upper body clothing?: A Little Help from another person to put on and taking off regular lower body clothing?: A Lot 6 Click Score: 18   End of Session Equipment Utilized During Treatment: Gait belt;Rolling walker  Activity Tolerance: Patient tolerated treatment well Patient left: in bed;with call bell/phone within reach;with bed alarm set  OT Visit Diagnosis: Other abnormalities of gait and mobility (R26.89);Muscle weakness (generalized) (M62.81);Pain Pain - Right/Left: Right Pain - part of body: Knee                Time: 2585-2778 OT Time Calculation (min): 20 min Charges:  OT General Charges $OT Visit: 1 Visit OT Evaluation $OT Eval Moderate Complexity: 1 [address] OTR/L  Pager [place] 01/15/2018, 4:26 PM

## 2018-01-15 NOTE — Anesthesia Postprocedure Evaluation (Signed)
Anesthesia Post Note  Patient: Lisa Torres  Procedure(s) Performed: EXCISIONAL TOTAL KNEE ARTHROPLASTY WITH ANTIBIOTIC SPACERS (Right Knee)     Patient location during evaluation: PACU Anesthesia Type: MAC and Spinal Level of consciousness: awake and alert Pain management: pain level controlled Vital Signs Assessment: post-procedure vital signs reviewed and stable Respiratory status: spontaneous breathing, nonlabored ventilation, respiratory function stable and patient connected to nasal cannula oxygen Cardiovascular status: stable and blood pressure returned to baseline Postop Assessment: no apparent nausea or vomiting and spinal receding Anesthetic complications: no    Last Vitals:  Vitals:   01/15/18 1425 01/15/18 2000  BP: (!) 144/74 (!) 161/68  Pulse: 85 81  Resp: 16 16  Temp: 36.7 C   SpO2: 100% 100%    Last Pain:  Vitals:   01/15/18 1949  TempSrc:   PainSc: 6                  Bette Brienza

## 2018-01-15 NOTE — Care Management Important Message (Signed)
Important Message  Patient Details  Name: Lisa Torres MRN: [id number] Date of Birth: 1945/11/03   Medicare Important Message Given:  Yes    Orbie Pyo 01/15/2018, 1:33 PM

## 2018-01-15 NOTE — Discharge Summary (Addendum)
SPORTS MEDICINE & JOINT REPLACEMENT   Lara Mulch, MD   Carlyon Shadow, PA-C [place], [place], [place]  [address]                             [phone]  PATIENT ID: Lisa Torres        MRN:  [id number]          DOB/AGE: [birth year] / 72 y.o.    DISCHARGE SUMMARY  ADMISSION DATE:    01/12/2018 DISCHARGE DATE:   01/16/18  ADMISSION DIAGNOSIS: Loosening Failed TKA    DISCHARGE DIAGNOSIS:  Loosening/Failed TKA    ADDITIONAL DIAGNOSIS: Active Problems:   S/P revision of total knee  Past Medical History:  Diagnosis Date  . Breast cancer ([place]) 11/2011  . Chronic back pain   . Chronic leg pain    bilateral  . Chronic leukopenia   . DDD (degenerative disc disease), cervical   . DDD (degenerative disc disease), lumbar   . Gait abnormality   . GERD (gastroesophageal reflux disease)   . Hypertension   . Parkinson's disease ([place])   . Radiation 04/2012   33 treatments for breast cancer  . Right knee pain   . Thyroid disease     PROCEDURE: Procedure(s): EXCISIONAL TOTAL KNEE ARTHROPLASTY WITH ANTIBIOTIC SPACERS on 01/12/2018  CONSULTS:    See H&P in chart  HOSPITAL COURSE:  Lisa Torres is a 72 y.o. admitted on 01/12/2018 and found to have a diagnosis of Loosening/Failed TKA.  After appropriate laboratory studies were obtained  they were taken to the operating room on 01/12/2018 and underwent Procedure(s): EXCISIONAL TOTAL KNEE ARTHROPLASTY WITH ANTIBIOTIC SPACERS.   They were given perioperative antibiotics:  Anti-infectives (From admission, onward)   Start     Dose/Rate Route Frequency Ordered Stop   01/14/18 0000  cefTRIAXone (ROCEPHIN) IVPB  Status:  Discontinued     2 g Intravenous Every 24 hours 01/14/18 1126 01/14/18    01/14/18 0000  vancomycin IVPB     500 mg Intravenous Every 12 hours 01/14/18 1126 02/22/18 2359   01/13/18 2200  vancomycin (VANCOCIN) 500 mg in sodium chloride 0.9 % 100 mL IVPB     500 mg 100 mL/hr over 60 Minutes Intravenous  Every 12 hours 01/13/18 1043     01/13/18 1130  vancomycin (VANCOCIN) 1,250 mg in sodium chloride 0.9 % 250 mL IVPB     1,250 mg 166.7 mL/hr over 90 Minutes Intravenous  Once 01/13/18 1043 01/13/18 1425   01/13/18 1100  cefTRIAXone (ROCEPHIN) 2 g in sodium chloride 0.9 % 100 mL IVPB  Status:  Discontinued     2 g 200 mL/hr over 30 Minutes Intravenous Every 24 hours 01/13/18 1022 01/14/18 1453   01/12/18 1815  ceFAZolin (ANCEF) IVPB 2g/100 mL premix     2 g 200 mL/hr over 30 Minutes Intravenous Every 6 hours 01/12/18 1727 01/13/18 0614   01/12/18 1100  ceFAZolin (ANCEF) IVPB 2g/100 mL premix     2 g 200 mL/hr over 30 Minutes Intravenous To ShortStay Surgical 01/10/18 1517 01/12/18 1209    .  Patient given tranexamic acid IV or topical and exparel intra-operatively.  Tolerated the procedure well.    POD# 1: Vital signs were stable.  Patient denied Chest pain, shortness of breath, or calf pain.  Patient was started on Lovenox 30 mg subcutaneously twice daily at 8am.  Consults to PT, OT, and care management were made.  The patient was weight bearing as tolerated.  CPM was placed on the operative leg 0-90 degrees for 6-8 hours a day. When out of the CPM, patient was placed in the foam block to achieve full extension. Incentive spirometry was taught.  Dressing was changed.       POD #2, Continued  PT for ambulation and exercise program.  IV saline locked.  O2 discontinued.    The remainder of the hospital course was dedicated to ambulation and strengthening.   The patient was discharged on 3 Days Post-Op in  Good condition.  Blood products given:none  DIAGNOSTIC STUDIES: Recent vital signs:  Patient Vitals for the past 24 hrs:  BP Temp Temp src Pulse Resp SpO2  01/15/18 0752 (!) 156/74 - - - - -  01/15/18 0425 134/70 99.9 F (37.7 C) Oral 93 16 100 %  01/14/18 1945 136/72 98.2 F (36.8 C) Oral 87 16 100 %  01/14/18 1414 130/60 98.7 F (37.1 C) Oral 87 16 100 %       Recent  laboratory studies: Recent Labs    01/13/18 0503 01/14/18 0341 01/15/18 0346  WBC 5.8 8.7 6.6  HGB 10.3* 9.3* 8.5*  HCT 33.3* 29.0* 27.0*  PLT 204 190 197   Recent Labs    01/13/18 0503  NA 136  K 3.6  CL 103  CO2 28  BUN 7*  CREATININE 0.78  GLUCOSE 96  CALCIUM 8.9   No results found for: INR, PROTIME   Recent Radiographic Studies :  [place] Ekg Site Rite  Result Date: 01/13/2018 If Site Rite image not attached, placement could not be confirmed due to current cardiac rhythm.   DISCHARGE INSTRUCTIONS: Discharge Instructions    Call MD / Call 911   Complete by:  As directed    If you experience chest pain or shortness of breath, CALL 911 and be transported to the hospital emergency room.  If you develope a fever above 101 F, pus (white drainage) or increased drainage or redness at the wound, or calf pain, call your surgeon's office.   Constipation Prevention   Complete by:  As directed    Drink plenty of fluids.  Prune juice may be helpful.  You may use a stool softener, such as Colace (over the counter) 100 mg twice a day.  Use MiraLax (over the counter) for constipation as needed.   Diet - low sodium heart healthy   Complete by:  As directed    Discharge instructions   Complete by:  As directed    INSTRUCTIONS AFTER JOINT REPLACEMENT   Remove items at home which could result in a fall. This includes throw rugs or furniture in walking pathways ICE to the affected joint every three hours while awake for 30 minutes at a time, for at least the first 3-5 days, and then as needed for pain and swelling.  Continue to use ice for pain and swelling. You may notice swelling that will progress down to the foot and ankle.  This is normal after surgery.  Elevate your leg when you are not up walking on it.   Continue to use the breathing machine you got in the hospital (incentive spirometer) which will help keep your temperature down.  It is common for your temperature to cycle up and down  following surgery, especially at night when you are not up moving around and exerting yourself.  The breathing machine keeps your lungs expanded and your temperature down.   DIET:  As you were doing prior to hospitalization, we recommend a well-balanced diet.  DRESSING / WOUND CARE / SHOWERING  Keep the surgical dressing until follow up.  The dressing is water proof, so you can shower without any extra covering.  IF THE DRESSING FALLS OFF or the wound gets wet inside, change the dressing with sterile gauze.  Please use good hand washing techniques before changing the dressing.  Do not use any lotions or creams on the incision until instructed by your surgeon.    ACTIVITY  Increase activity slowly as tolerated, but follow the weight bearing instructions below.   No driving for 6 weeks or until further direction given by your physician.  You cannot drive while taking narcotics.  No lifting or carrying greater than 10 lbs. until further directed by your surgeon. Avoid periods of inactivity such as sitting longer than an hour when not asleep. This helps prevent blood clots.  You may return to work once you are authorized by your doctor.     WEIGHT BEARING   Weight bearing as tolerated with assist device (walker, cane, etc) as directed, use it as long as suggested by your surgeon or therapist, typically at least 4-6 weeks.   EXERCISES  Results after joint replacement surgery are often greatly improved when you follow the exercise, range of motion and muscle strengthening exercises prescribed by your doctor. Safety measures are also important to protect the joint from further injury. Any time any of these exercises cause you to have increased pain or swelling, decrease what you are doing until you are comfortable again and then slowly increase them. If you have problems or questions, call your caregiver or physical therapist for advice.   Rehabilitation is important following a joint  replacement. After just a few days of immobilization, the muscles of the leg can become weakened and shrink (atrophy).  These exercises are designed to build up the tone and strength of the thigh and leg muscles and to improve motion. Often times heat used for twenty to thirty minutes before working out will loosen up your tissues and help with improving the range of motion but do not use heat for the first two weeks following surgery (sometimes heat can increase post-operative swelling).   These exercises can be done on a training (exercise) mat, on the floor, on a table or on a bed. Use whatever works the best and is most comfortable for you.    Use music or television while you are exercising so that the exercises are a pleasant break in your day. This will make your life better with the exercises acting as a break in your routine that you can look forward to.   Perform all exercises about fifteen times, three times per day or as directed.  You should exercise both the operative leg and the other leg as well.   Exercises include:   Quad Sets - Tighten up the muscle on the front of the thigh (Quad) and hold for 5-10 seconds.   Straight Leg Raises - With your knee straight (if you were given a brace, keep it on), lift the leg to 60 degrees, hold for 3 seconds, and slowly lower the leg.  Perform this exercise against resistance later as your leg gets stronger.  Leg Slides: Lying on your back, slowly slide your foot toward your buttocks, bending your knee up off the floor (only go as far as is comfortable). Then slowly slide your foot back down until your leg  is flat on the floor again.  Angel Wings: Lying on your back spread your legs to the side as far apart as you can without causing discomfort.  Hamstring Strength:  Lying on your back, push your heel against the floor with your leg straight by tightening up the muscles of your buttocks.  Repeat, but this time bend your knee to a comfortable angle, and  push your heel against the floor.  You may put a pillow under the heel to make it more comfortable if necessary.   A rehabilitation program following joint replacement surgery can speed recovery and prevent re-injury in the future due to weakened muscles. Contact your doctor or a physical therapist for more information on knee rehabilitation.    CONSTIPATION  Constipation is defined medically as fewer than three stools per week and severe constipation as less than one stool per week.  Even if you have a regular bowel pattern at home, your normal regimen is likely to be disrupted due to multiple reasons following surgery.  Combination of anesthesia, postoperative narcotics, change in appetite and fluid intake all can affect your bowels.   YOU MUST use at least one of the following options; they are listed in order of increasing strength to get the job done.  They are all available over the counter, and you may need to use some, POSSIBLY even all of these options:    Drink plenty of fluids (prune juice may be helpful) and high fiber foods Colace 100 mg by mouth twice a day  Senokot for constipation as directed and as needed Dulcolax (bisacodyl), take with full glass of water  Miralax (polyethylene glycol) once or twice a day as needed.  If you have tried all these things and are unable to have a bowel movement in the first 3-4 days after surgery call either your surgeon or your primary doctor.    If you experience loose stools or diarrhea, hold the medications until you stool forms back up.  If your symptoms do not get better within 1 week or if they get worse, check with your doctor.  If you experience "the worst abdominal pain ever" or develop nausea or vomiting, please contact the office immediately for further recommendations for treatment.   ITCHING:  If you experience itching with your medications, try taking only a single pain pill, or even half a pain pill at a time.  You can also use  Benadryl over the counter for itching or also to help with sleep.   TED HOSE STOCKINGS:  Use stockings on both legs until for at least 2 weeks or as directed by physician office. They may be removed at night for sleeping.  MEDICATIONS:  See your medication summary on the "After Visit Summary" that nursing will review with you.  You may have some home medications which will be placed on hold until you complete the course of blood thinner medication.  It is important for you to complete the blood thinner medication as prescribed.  PRECAUTIONS:  If you experience chest pain or shortness of breath - call 911 immediately for transfer to the hospital emergency department.   If you develop a fever greater that 101 F, purulent drainage from wound, increased redness or drainage from wound, foul odor from the wound/dressing, or calf pain - CONTACT YOUR SURGEON.  FOLLOW-UP APPOINTMENTS:  If you do not already have a post-op appointment, please call the office for an appointment to be seen by your surgeon.  Guidelines for how soon to be seen are listed in your "After Visit Summary", but are typically between 1-4 weeks after surgery.  OTHER INSTRUCTIONS:   Knee Replacement:  Do not place pillow under knee, focus on keeping the knee straight while resting. CPM instructions: 0-90 degrees, 2 hours in the morning, 2 hours in the afternoon, and 2 hours in the evening. Place foam block, curve side up under heel at all times except when in CPM or when walking.  DO NOT modify, tear, cut, or change the foam block in any way.  MAKE SURE YOU:  Understand these instructions.  Get help right away if you are not doing well or get worse.    Thank you for letting us be a part of your medical care team.  It is a privilege we respect greatly.  We hope these instructions will help you stay on track for a fast and full recovery!   Home infusion instructions [facility]  May follow [place] Dosing Protocol; May administer Cathflo as needed to maintain patency of vascular access device.; Flushing of vascular access device: per [facility] Protocol: 0.9% NaCl pre/post medica...   Complete by:  As directed    Instructions:  May follow [place] Dosing Protocol   Instructions:  May administer Cathflo as needed to maintain patency of vascular access device.   Instructions:  Flushing of vascular access device: per [facility] Protocol: 0.9% NaCl pre/post medication administration and prn patency; Heparin 100 u/ml, 31m for implanted ports and Heparin 10u/ml, 528mfor all other central venous catheters.   Instructions:  May follow [facility] Anaphylaxis Protocol for First Dose Administration in the home: 0.9% NaCl at 25-50 ml/hr to maintain IV access for protocol meds. Epinephrine 0.3 ml IV/IM PRN and Benadryl 25-50 IV/IM PRN s/s of anaphylaxis.   Instructions:  [facility] Coordinator (RN) to assist per patient IV care needs in the home PRN.   Increase activity slowly as tolerated   Complete by:  As directed       DISCHARGE MEDICATIONS:   Allergies as of 01/15/2018      Reactions   Hydrocodone Other (See Comments)   Heart pain  Delusions   Tramadol Other (See Comments)   Hallucinations, abd pain Delusions (intolerance)      Medication List    STOP taking these medications   ibuprofen 200 MG tablet Commonly known as:  ADVIL,MOTRIN   triamcinolone cream 0.1 % Commonly known as:  KENALOG     TAKE these medications   acetaminophen 500 MG tablet Commonly known as:  TYLENOL Take 1,000 mg by mouth 3 (three) times daily as needed for moderate pain or headache.   alum & mag hydroxide-simeth 200-200-20 MG/5ML suspension Commonly known as:  MAALOX/MYLANTA Take 15 mLs by mouth every 6 (six) hours as needed for indigestion or heartburn.   amLODipine 5 MG tablet Commonly known as:  NORVASC Take 5 mg by mouth every morning.   aspirin 325 MG EC tablet Take 1  tablet (325 mg total) by mouth 2 (two) times daily.   levothyroxine 100 MCG tablet Commonly known as:  SYNTHROID, LEVOTHROID Take 100 mcg by mouth daily before breakfast.   Melatonin 3 MG Caps Take 3 mg by mouth as needed.   methocarbamol 500 MG tablet Commonly known as:  ROBAXIN Take 1-2 tablets (  500-1,000 mg total) by mouth every 6 (six) hours as needed for muscle spasms.   oxyCODONE 5 MG immediate release tablet Commonly known as:  Oxy IR/ROXICODONE Take 1-2 tablets (5-10 mg total) by mouth every 6 (six) hours as needed for moderate pain (pain score 4-6).   pantoprazole 40 MG tablet Commonly known as:  PROTONIX Take 1 tablet (40 mg total) by mouth daily before breakfast.   traZODone 50 MG tablet Commonly known as:  DESYREL Take 50 mg by mouth at bedtime   vancomycin IVPB Inject 500 mg into the vein every 12 (twelve) hours. Indication:  Prosthetic Joint Infection  Last Day of Therapy:  02/22/18 Labs - _0 /29/19 1727   01/12/18 1728  DME Bedside commode  Once    Question:  Patient needs a bedside commode to treat with the following condition  Answer:  S/P revision of total knee   01/12/18 1727      FOLLOW UP VISIT:    DISPOSITION: HOME VS. SNF  CONDITION:  Good   Donia Ast 01/15/2018, 12:50 PM  SPORTS MEDICINE & JOINT REPLACEMENT   Lara Mulch, MD   Carlyon Shadow, PA-C [place], [place], [place]  [address]                             [phone]  PATIENT ID: Lisa Torres        MRN:  [id number]          DOB/AGE: 27-Apr-1946 / 72 y.o.    DISCHARGE SUMMARY  ADMISSION DATE:    01/12/2018 DISCHARGE DATE:   01/15/2018   ADMISSION DIAGNOSIS: Loosening Failed TKA  DISCHARGE DIAGNOSIS:  Loosening/Failed TKA    ADDITIONAL DIAGNOSIS: Active Problems:   S/P revision of total knee  Past Medical History:  Diagnosis Date  . Breast cancer ([place]) 11/2011  . Chronic back pain   . Chronic leg pain    bilateral  . Chronic leukopenia   . DDD (degenerative disc disease), cervical   . DDD (degenerative disc disease), lumbar   . Gait abnormality   . GERD (gastroesophageal reflux disease)   . Hypertension   . Parkinson's disease ([place])   . Radiation 04/2012   33 treatments for breast cancer  . Right knee pain   . Thyroid disease     PROCEDURE: Procedure(s): EXCISIONAL TOTAL  KNEE ARTHROPLASTY WITH ANTIBIOTIC SPACERS on 01/12/2018  CONSULTS:    HISTORY:  See H&P in chart  HOSPITAL COURSE:  NARIYAH OSIAS is a 72 y.o. admitted on 01/12/2018 and found to have a diagnosis of Loosening/Failed TKA.  After appropriate laboratory studies were obtained  they were taken to the operating room on 01/12/2018 and underwent Procedure(s): EXCISIONAL TOTAL KNEE ARTHROPLASTY WITH ANTIBIOTIC SPACERS.   They were given perioperative antibiotics:  Anti-infectives (From admission, onward)   Start     Dose/Rate Route Frequency Ordered Stop   01/14/18 0000  cefTRIAXone (ROCEPHIN) IVPB  Status:  Discontinued     2 g Intravenous Every 24 hours 01/14/18 1126 01/14/18    01/14/18 0000  vancomycin IVPB     500 mg Intravenous Every 12 hours 01/14/18 1126 02/22/18 2359   01/13/18 2200  vancomycin (VANCOCIN) 500 mg in sodium chloride 0.9 % 100 mL IVPB     500 mg 100 mL/hr over 60 Minutes Intravenous Every 12 hours 01/13/18 1043     01/13/18 1130  vancomycin (VANCOCIN) 1,250 mg in sodium chloride 0.9 % 250 mL IVPB     1,250 mg 166.7 mL/hr over 90 Minutes Intravenous  Once 01/13/18 1043 01/13/18 1425   01/13/18 1100  cefTRIAXone (ROCEPHIN) 2 g in sodium chloride 0.9 % 100 mL IVPB  Status:  Discontinued     2 g 200 mL/hr over 30 Minutes Intravenous Every 24 hours 01/13/18 1022 01/14/18 1453   01/12/18 1815  ceFAZolin (ANCEF) IVPB 2g/100 mL premix     2 g 200 mL/hr over 30 Minutes Intravenous Every 6 hours 01/12/18 1727 01/13/18 0614   01/12/18 1100  ceFAZolin (ANCEF) IVPB 2g/100 mL premix     2 g 200 mL/hr over 30 Minutes Intravenous To ShortStay Surgical 01/10/18 1517 01/12/18 1209    .  Patient given tranexamic acid IV or topical and exparel intra-operatively.  Tolerated the procedure well.    POD# 1: Vital signs were stable.  Patient denied Chest pain, shortness of breath, or calf pain.  Patient was started on Lovenox 30 mg subcutaneously twice daily at 8am.  Consults to PT, OT,  and care management were made.  The patient was weight bearing as tolerated.  CPM was placed on the operative leg 0-90 degrees for 6-8 hours a day. When out of the CPM, patient was placed in the foam block to achieve full extension. Incentive spirometry was taught.  Dressing was changed.       POD #2, Continued  PT for ambulation and exercise program.  IV saline locked.  O2 discontinued.    The remainder of the hospital course was dedicated to ambulation and strengthening.   The patient was discharged on 3 Days Post-Op in  Good condition.  Blood products given:none  DIAGNOSTIC STUDIES:  Recent vital signs:  Patient Vitals for the past 24 hrs:  BP Temp Temp src Pulse Resp SpO2  01/15/18 0752 (!) 156/74 - - - - -  01/15/18 0425 134/70 99.9 F (37.7 C) Oral 93 16 100 %  01/14/18 1945 136/72 98.2 F (36.8 C) Oral 87 16 100 %  01/14/18 1414 130/60 98.7 F (37.1 C) Oral 87 16 100 %       Recent laboratory studies: Recent Labs    01/13/18 0503 01/14/18 0341 01/15/18 0346  WBC 5.8 8.7 6.6  HGB 10.3* 9.3* 8.5*  HCT 33.3* 29.0* 27.0*  PLT 204 190 197   Recent Labs    01/13/18 0503  NA 136  K 3.6  CL 103  CO2 28  BUN 7*  CREATININE 0.78  GLUCOSE 96  CALCIUM 8.9   No results found for: INR, PROTIME   Recent Radiographic Studies :  [place] Ekg Site Rite  Result Date: 01/13/2018 If Site Rite image not attached, placement could not be confirmed due to current cardiac rhythm.   DISCHARGE INSTRUCTIONS: Discharge Instructions    Call MD / Call 911   Complete by:  As directed    If you experience chest pain or shortness of breath, CALL 911 and be transported to the hospital emergency room.  If you develope a fever above 101 F, pus (white drainage) or increased drainage or redness at the wound, or calf pain, call your surgeon's office.   Constipation Prevention   Complete by:  As directed    Drink plenty of fluids.  Prune juice may be helpful.  You may use a stool softener, such as  Colace (over the counter) 100 mg twice a day.  Use MiraLax (over the counter) for constipation as needed.   Diet - low sodium heart healthy   Complete by:  As directed    Discharge instructions   Complete by:  As directed    INSTRUCTIONS AFTER JOINT REPLACEMENT   Remove items at home which could result in a fall. This includes throw rugs or furniture in walking pathways ICE to the affected joint every three hours while awake for 30 minutes at a time, for at least the first 3-5 days, and then as needed for pain and swelling.  Continue to use ice for pain and swelling. You may notice swelling that will progress down to the foot and ankle.  This is normal after surgery.  Elevate your leg when you are not up walking on it.   Continue to use the breathing machine you got in the hospital (incentive spirometer) which will help keep your temperature down.  It is common for your temperature to cycle up and down following surgery, especially at night when you are not up moving around and exerting yourself.  The breathing machine keeps your lungs expanded and your temperature down.   DIET:  As you were doing prior to hospitalization, we recommend a well-balanced diet.  DRESSING / WOUND CARE / SHOWERING  Keep the surgical dressing until follow up.  The dressing is water proof, so you can shower without any extra covering.  IF THE DRESSING FALLS OFF or the wound gets wet inside, change the dressing with sterile gauze.  Please use good hand washing techniques before changing the dressing.  Do not use any lotions or creams on the incision until instructed by your surgeon.    ACTIVITY  Increase activity slowly as tolerated, but follow the weight bearing instructions below.   No  driving for 6 weeks or until further direction given by your physician.  You cannot drive while taking narcotics.  No lifting or carrying greater than 10 lbs. until further directed by your surgeon. Avoid periods of inactivity such as  sitting longer than an hour when not asleep. This helps prevent blood clots.  You may return to work once you are authorized by your doctor.     WEIGHT BEARING   Weight bearing as tolerated with assist device (walker, cane, etc) as directed, use it as long as suggested by your surgeon or therapist, typically at least 4-6 weeks.   EXERCISES  Results after joint replacement surgery are often greatly improved when you follow the exercise, range of motion and muscle strengthening exercises prescribed by your doctor. Safety measures are also important to protect the joint from further injury. Any time any of these exercises cause you to have increased pain or swelling, decrease what you are doing until you are comfortable again and then slowly increase them. If you have problems or questions, call your caregiver or physical therapist for advice.   Rehabilitation is important following a joint replacement. After just a few days of immobilization, the muscles of the leg can become weakened and shrink (atrophy).  These exercises are designed to build up the tone and strength of the thigh and leg muscles and to improve motion. Often times heat used for twenty to thirty minutes before working out will loosen up your tissues and help with improving the range of motion but do not use heat for the first two weeks following surgery (sometimes heat can increase post-operative swelling).   These exercises can be done on a training (exercise) mat, on the floor, on a table or on a bed. Use whatever works the best and is most comfortable for you.    Use music or television while you are exercising so that the exercises are a pleasant break in your day. This will make your life better with the exercises acting as a break in your routine that you can look forward to.   Perform all exercises about fifteen times, three times per day or as directed.  You should exercise both the operative leg and the other leg as well.    Exercises include:   Quad Sets - Tighten up the muscle on the front of the thigh (Quad) and hold for 5-10 seconds.   Straight Leg Raises - With your knee straight (if you were given a brace, keep it on), lift the leg to 60 degrees, hold for 3 seconds, and slowly lower the leg.  Perform this exercise against resistance later as your leg gets stronger.  Leg Slides: Lying on your back, slowly slide your foot toward your buttocks, bending your knee up off the floor (only go as far as is comfortable). Then slowly slide your foot back down until your leg is flat on the floor again.  Angel Wings: Lying on your back spread your legs to the side as far apart as you can without causing discomfort.  Hamstring Strength:  Lying on your back, push your heel against the floor with your leg straight by tightening up the muscles of your buttocks.  Repeat, but this time bend your knee to a comfortable angle, and push your heel against the floor.  You may put a pillow under the heel to make it more comfortable if necessary.   A rehabilitation program following joint replacement surgery can speed recovery and prevent re-injury in  the future due to weakened muscles. Contact your doctor or a physical therapist for more information on knee rehabilitation.    CONSTIPATION  Constipation is defined medically as fewer than three stools per week and severe constipation as less than one stool per week.  Even if you have a regular bowel pattern at home, your normal regimen is likely to be disrupted due to multiple reasons following surgery.  Combination of anesthesia, postoperative narcotics, change in appetite and fluid intake all can affect your bowels.   YOU MUST use at least one of the following options; they are listed in order of increasing strength to get the job done.  They are all available over the counter, and you may need to use some, POSSIBLY even all of these options:    Drink plenty of fluids (prune juice may  be helpful) and high fiber foods Colace 100 mg by mouth twice a day  Senokot for constipation as directed and as needed Dulcolax (bisacodyl), take with full glass of water  Miralax (polyethylene glycol) once or twice a day as needed.  If you have tried all these things and are unable to have a bowel movement in the first 3-4 days after surgery call either your surgeon or your primary doctor.    If you experience loose stools or diarrhea, hold the medications until you stool forms back up.  If your symptoms do not get better within 1 week or if they get worse, check with your doctor.  If you experience "the worst abdominal pain ever" or develop nausea or vomiting, please contact the office immediately for further recommendations for treatment.   ITCHING:  If you experience itching with your medications, try taking only a single pain pill, or even half a pain pill at a time.  You can also use Benadryl over the counter for itching or also to help with sleep.   TED HOSE STOCKINGS:  Use stockings on both legs until for at least 2 weeks or as directed by physician office. They may be removed at night for sleeping.  MEDICATIONS:  See your medication summary on the "After Visit Summary" that nursing will review with you.  You may have some home medications which will be placed on hold until you complete the course of blood thinner medication.  It is important for you to complete the blood thinner medication as prescribed.  PRECAUTIONS:  If you experience chest pain or shortness of breath - call 911 immediately for transfer to the hospital emergency department.   If you develop a fever greater that 101 F, purulent drainage from wound, increased redness or drainage from wound, foul odor from the wound/dressing, or calf pain - CONTACT YOUR SURGEON.                                                   FOLLOW-UP APPOINTMENTS:  If you do not already have a post-op appointment, please call the office for an  appointment to be seen by your surgeon.  Guidelines for how soon to be seen are listed in your "After Visit Summary", but are typically between 1-4 weeks after surgery.  OTHER INSTRUCTIONS:   Knee Replacement:  Do not place pillow under knee, focus on keeping the knee straight while resting. CPM instructions: 0-90 degrees, 2 hours in the morning, 2 hours in the afternoon,  and 2 hours in the evening. Place foam block, curve side up under heel at all times except when in CPM or when walking.  DO NOT modify, tear, cut, or change the foam block in any way.  MAKE SURE YOU:  Understand these instructions.  Get help right away if you are not doing well or get worse.    Thank you for letting us be a part of your medical care team.  It is a privilege we respect greatly.  We hope these instructions will help you stay on track for a fast and full recovery!   Home infusion instructions [facility] May follow [place] Dosing Protocol; May administer Cathflo as needed to maintain patency of vascular access device.; Flushing of vascular access device: per [facility] Protocol: 0.9% NaCl pre/post medica...   Complete by:  As directed    Instructions:  May follow [place] Dosing Protocol   Instructions:  May administer Cathflo as needed to maintain patency of vascular access device.   Instructions:  Flushing of vascular access device: per [facility] Protocol: 0.9% NaCl pre/post medication administration and prn patency; Heparin 100 u/ml, 85m for implanted ports and Heparin 10u/ml, 582mfor all other central venous catheters.   Instructions:  May follow [facility] Anaphylaxis Protocol for First Dose Administration in the home: 0.9% NaCl at 25-50 ml/hr to maintain IV access for protocol meds. Epinephrine 0.3 ml IV/IM PRN and Benadryl 25-50 IV/IM PRN s/s of anaphylaxis.   Instructions:  [facility] Coordinator (RN) to assist per patient IV care needs in the home PRN.   Increase activity slowly as tolerated    Complete by:  As directed       DISCHARGE MEDICATIONS:   Allergies as of 01/15/2018      Reactions   Hydrocodone Other (See Comments)   Heart pain  Delusions   Tramadol Other (See Comments)   Hallucinations, abd pain Delusions (intolerance)      Medication List    STOP taking these medications   ibuprofen 200 MG tablet Commonly known as:  ADVIL,MOTRIN   triamcinolone cream 0.1 % Commonly known as:  KENALOG     TAKE these medications   acetaminophen 500 MG tablet Commonly known as:  TYLENOL Take 1,000 mg by mouth 3 (three) times daily as needed for moderate pain or headache.   alum & mag hydroxide-simeth 200-200-20 MG/5ML suspension Commonly known as:  MAALOX/MYLANTA Take 15 mLs by mouth every 6 (six) hours as needed for indigestion or heartburn.   amLODipine 5 MG tablet Commonly known as:  NORVASC Take 5 mg by mouth every morning.   aspirin 325 MG EC tablet Take 1 tablet (325 mg total) by mouth 2 (two) times daily.   levothyroxine 100 MCG tablet Commonly known as:  SYNTHROID, LEVOTHROID Take 100 mcg by mouth daily before breakfast.   Melatonin 3 MG Caps Take 3 mg by mouth as needed.   methocarbamol 500 MG tablet Commonly known as:  ROBAXIN Take 1-2 tablets (500-1,000 mg total) by mouth every 6 (six) hours as needed for muscle spasms.   oxyCODONE 5 MG immediate release tablet Commonly known as:  Oxy IR/ROXICODONE Take 1-2 tablets (5-10 mg total) by mouth every 6 (six) hours as needed for moderate pain (pain score 4-6).   pantoprazole 40 MG tablet Commonly known as:  PROTONIX Take 1 tablet (40 mg total) by mouth daily before breakfast.   traZODone 50 MG tablet Commonly known as:  DESYREL Take 50 mg by  mouth at bedtime   vancomycin IVPB Inject 500 mg into the vein every 12 (twelve) hours. Indication:  Prosthetic Joint Infection  Last Day of Therapy:  02/22/18 Labs - _0 /29/19 1727   01/12/18 1728  DME Bedside commode  Once    Question:  Patient needs a bedside commode to treat with the following condition  Answer:  S/P revision of total knee   01/12/18 1727      FOLLOW UP VISIT:    DISPOSITION: HOME VS. SNF  CONDITION:  Good   Donia Ast 01/15/2018, 12:50 PM

## 2018-01-15 NOTE — NC FL2 (Signed)
[place] MEDICAID FL2 LEVEL OF CARE SCREENING TOOL     IDENTIFICATION  Patient Name: Lisa Torres Birthdate: [birth year] Sex: female Admission Date (Current Location): 01/12/2018  [facility] and [place] Number:  Herbalist and Address:  [facility] [place], [place] [address] [place], [place] [address]      Provider Number: [id number]  Attending Physician Name and Address:  Vickey Huger, MD  Relative Name and Phone Number:  Lanora Manis [phone]    Current Level of Care: Hospital Recommended Level of Care: [place] Prior Approval Number:    Date Approved/Denied:   PASRR Number: [id number]  Discharge Plan: SNF    Current Diagnoses: Patient Active Problem List   Diagnosis Date Noted  . 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 Other chest pain 11/04/2017  . Abdominal pain, epigastric 11/04/2017  . Chest pain 01/05/2017  . Breast cancer, left breast ([place]) 11/02/2015  . Difficulty walking 10/20/2013  . Parkinson disease ([place]) 08/24/2013  . Hypothyroid 05/05/2013  . Hypertension 05/05/2013    Orientation RESPIRATION BLADDER Height & Weight     Self, Time, Situation, Place  Normal Continent Weight: 150 lb (68 kg) Height:  5' 1.5" (156.2 cm)  BEHAVIORAL SYMPTOMS/MOOD NEUROLOGICAL BOWEL NUTRITION STATUS      Continent Diet(see Discharge Summary )  AMBULATORY STATUS COMMUNICATION OF NEEDS Skin     Verbally                         Personal Care Assistance Level of Assistance  Bathing, Feeding, Dressing Bathing Assistance: Maximum assistance Feeding assistance: Independent Dressing Assistance: Maximum assistance     Functional Limitations Info  Sight, Hearing, Speech Sight Info: Adequate Hearing Info: Adequate Speech Info: Adequate    SPECIAL CARE FACTORS FREQUENCY  PT (By licensed PT), OT (By licensed OT)     PT Frequency: 7x per wk OT Frequency: 5x per wk            Contractures Contractures  Info: Not present    Additional Factors Info  Code Status, Allergies Code Status Info: FULL Allergies Info: Hydrocodone, Tramadol            Current Medications (01/15/2018):  This is the current hospital active medication list Current Facility-Administered Medications  Medication Dose Route Frequency Provider Last Rate Last Dose  . alum & mag hydroxide-simeth (MAALOX/MYLANTA) 200-200-20 MG/5ML suspension 30 mL  30 mL Oral Q4H PRN Donia Ast, [place]      . amLODipine (NORVASC) tablet 5 mg  5 mg Oral q morning - 10a Donia Ast, [place]   5 mg at 01/15/18 8413  . aspirin EC tablet 325 mg  325 mg Oral BID Donia Ast, [place]   325 mg at 01/15/18 0756  . bisacodyl (DULCOLAX) EC tablet 5 mg  5 mg Oral Daily PRN Donia Ast, [place]      . diphenhydrAMINE (BENADRYL) 12.5 MG/5ML elixir 12.5-25 mg  12.5-25 mg Oral Q4H PRN Donia Ast, [place]      . docusate sodium (COLACE) capsule 100 mg  100 mg Oral BID Donia Ast, [place]   100 mg at 01/15/18 0757  . feeding supplement (ENSURE ENLIVE) (ENSURE ENLIVE) liquid 237 mL  237 mL Oral BID BM Vickey Huger, MD   237 mL at 01/15/18 0843  . ferrous sulfate tablet 325 mg  325 mg Oral TID PC Donia Ast, [place]   325 mg  at 01/15/18 0749  . gabapentin (NEURONTIN) capsule 300 mg  300 mg Oral TID Donia Ast, PA   300 mg at 01/15/18 0843  . HYDROmorphone (DILAUDID) injection 0.5-1 mg  0.5-1 mg Intravenous Q4H PRN Donia Ast, [place]      . levothyroxine (SYNTHROID, LEVOTHROID) tablet 100 mcg  100 mcg Oral QAC breakfast Donia Ast, [place]   100 mcg at 01/15/18 0753  . menthol-cetylpyridinium (CEPACOL) lozenge 3 mg  1 lozenge Oral PRN Donia Ast, PA       Or  . phenol (CHLORASEPTIC) mouth spray 1 spray  1 spray Mouth/Throat PRN Donia Ast, PA      . methocarbamol (ROBAXIN) tablet 500 mg  500 mg Oral Q6H PRN Donia Ast, [place]   500 mg at 01/15/18 0840   Or  . methocarbamol (ROBAXIN) 500 mg in  dextrose 5 % 50 mL IVPB  500 mg Intravenous Q6H PRN Donia Ast, PA      . metoCLOPramide (REGLAN) tablet 5-10 mg  5-10 mg Oral Q8H PRN Donia Ast, PA       Or  . metoCLOPramide (REGLAN) injection 5-10 mg  5-10 mg Intravenous Q8H PRN Donia Ast, [place]      . ondansetron [facility]) tablet 4 mg  4 mg Oral Q6H PRN Donia Ast, PA       Or  . ondansetron [facility]) injection 4 mg  4 mg Intravenous Q6H PRN Donia Ast, [place]      . oxyCODONE (Oxy IR/ROXICODONE) immediate release tablet 5-10 mg  5-10 mg Oral Q4H PRN Donia Ast, PA   10 mg at 01/15/18 0840  . pantoprazole (PROTONIX) EC tablet 40 mg  40 mg Oral QAC breakfast Donia Ast, [place]   40 mg at 01/15/18 0753  . senna-docusate (Senokot-S) tablet 1 tablet  1 tablet Oral QHS PRN Donia Ast, PA      . sodium chloride flush (NS) 0.9 % injection 10-40 mL  10-40 mL Intracatheter Q12H Vickey Huger, MD   10 mL at 01/14/18 1000  . sodium chloride flush (NS) 0.9 % injection 10-40 mL  10-40 mL Intracatheter PRN Vickey Huger, MD      . sodium phosphate (FLEET) 7-19 GM/118ML enema 1 enema  1 enema Rectal Once PRN Donia Ast, PA      . traZODone (DESYREL) tablet 50 mg  50 mg Oral QHS Donia Ast, [place]   50 mg at 01/14/18 2100  . vancomycin (VANCOCIN) 500 mg in sodium chloride 0.9 % 100 mL IVPB  500 mg Intravenous Q12H Susa Raring, RPH 100 mL/hr at 01/15/18 0847 500 mg at 01/15/18 0847  . zolpidem (AMBIEN) tablet 5 mg  5 mg Oral QHS PRN Donia Ast, [place]         Discharge Medications: Please see discharge summary for a list of discharge medications.  Relevant Imaging Results:  Relevant Lab Results:   Additional Information SSN [id number]  Vinie Sill, LCSWA

## 2018-01-16 MED ORDER — HEPARIN SOD (PORK) LOCK FLUSH 100 UNIT/ML IV SOLN
250.0000 [IU] | INTRAVENOUS | Status: AC | PRN
  Administered 2018-01-16: 250 [IU]

## 2018-01-16 NOTE — Progress Notes (Signed)
Physical Therapy Treatment Patient Details Name: Lisa Torres MRN: [id number] DOB: 16-Feb-1946 Today's Date: 01/16/2018    History of Present Illness 72 y.o. 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  Pt with significant PMH of Parkinson's disease, HTN, DDD, chronic leukopenia, chronic leg and back pain, breast CA, several R knee surgeries.    PT Comments    Pt performed multiple transfers via multiple technque including sit to stand, squat pivot and lateral scoot via sliding board.  She requires mod to max assistance to complete transfers.  Pt performed 140 ft of WC mobility with mod assistance to improve upper body strength.  Plan remains appropriate for SNF placement to improve strength and function before returning home.      Follow Up Recommendations  Supervision/Assistance - 24 hour;Other (comment);Follow surgeon's recommendation for DC plan and follow-up therapies     Equipment Recommendations  None recommended by PT;Other (comment)(long slide board 30 inch.  )    Recommendations for Other Services       Precautions / Restrictions Precautions Precautions: Fall;Knee Required Braces or Orthoses: Knee Immobilizer - Right Restrictions Weight Bearing Restrictions: Yes RLE Weight Bearing: Weight bearing as tolerated    Mobility  Bed Mobility Overal bed mobility: Needs Assistance Bed Mobility: Supine to Sit     Supine to sit: HOB elevated;Min guard     General bed mobility comments: increased time and effort to exit bed but able to scoot forward unassisted, min guard for safety.    Transfers Overall transfer level: Needs assistance Equipment used: Sliding board(sliding board and sara stedy.  ) Transfers: Sit to/from Stand Sit to Stand: Max assist;+2 physical assistance   Squat pivot transfers: Max assist(from WC to recliner and recliner to [facility])    Lateral/Scoot Transfers: Mod assist;With slide  board General transfer comment: Cues for hand placement, assistance to place slide board and used chux pad to assist.  Pt performed multiple sit to stand from [facility] with use of sara stedy she remains unable to stand erect due to flexion contractures.    Ambulation/Gait Ambulation/Gait assistance: (NT)               Stairs             Wheelchair Mobility    Modified Rankin (Stroke Patients Only)       Balance Overall balance assessment: Needs assistance Sitting-balance support: Feet supported;Bilateral upper extremity supported Sitting balance-Leahy Scale: Fair Sitting balance - Comments: Leaning posteriorly and required VCs and min assistance to correct.       Standing balance-Leahy Scale: Poor                              Cognition Arousal/Alertness: Awake/alert Behavior During Therapy: WFL for tasks assessed/performed Overall Cognitive Status: Within Functional Limits for tasks assessed                                        Exercises      General Comments        Pertinent Vitals/Pain Pain Assessment: Faces Faces Pain Scale: Hurts even more Pain Location: headache Pain Descriptors / Indicators: Constant;Discomfort Pain Intervention(s): Monitored during session;Repositioned    Home Living  Prior Function            PT Goals (current goals can now be found in the care plan section) Acute Rehab PT Goals Patient Stated Goal: to go to rehab Potential to Achieve Goals: Good Additional Goals Additional Goal #1: lateral WC transfer mod assist +2, min assist for WC propulsion with bil arms and/or legs 65' Progress towards PT goals: Progressing toward goals    Frequency    7X/week      PT Plan Current plan remains appropriate    Co-evaluation              AM-PAC PT "6 Clicks" Daily Activity  Outcome Measure  Difficulty turning over in bed (including adjusting bedclothes, sheets  and blankets)?: Unable Difficulty moving from lying on back to sitting on the side of the bed? : Unable Difficulty sitting down on and standing up from a chair with arms (e.g., wheelchair, bedside commode, etc,.)?: Unable Help needed moving to and from a bed to chair (including a wheelchair)?: A Lot Help needed walking in hospital room?: Total Help needed climbing 3-5 steps with a railing? : Total 6 Click Score: 7    End of Session Equipment Utilized During Treatment: Gait belt Activity Tolerance: Patient limited by fatigue Patient left: in chair;with call bell/phone within reach(pt left sitting on BSC reports she needs to have BM.  ) Nurse Communication: Mobility status PT Visit Diagnosis: Muscle weakness (generalized) (M62.81);Difficulty in walking, not elsewhere classified (R26.2);Pain Pain - Right/Left: Right Pain - part of body: Knee     Time: 0165-5374 PT Time Calculation (min) (ACUTE ONLY): 45 min  Charges:  $Therapeutic Activity: 23-37 mins $Wheel Chair Management: 8-22 mins                     Governor Rooks, PTA pager [phone]    Cristela Blue 01/16/2018, 3:34 PM

## 2018-01-16 NOTE — Progress Notes (Addendum)
RN called report to [place]. Facility currently refusing pt due to no BM. SW aware. PRNs given to help with BM. Will recall PTAR once this occurs.

## 2018-01-16 NOTE — Progress Notes (Signed)
Pt had BM. PTAR recalled for transport to facility.

## 2018-01-16 NOTE — Clinical Social Work Note (Signed)
Clinical Social Worker facilitated patient discharge including contacting patient family and facility to confirm patient discharge plans.  Clinical information faxed to facility and family agreeable with plan.  CSW arranged ambulance transport via PT[facility] to [facility]--room 106.  RN to call [phone] for report prior to discharge.  Clinical Social Worker will sign off for now as social work intervention is no longer needed. Please consult [place] again if new need arises.  Loletha Grayer, MSW [phone]

## 2018-01-16 NOTE — Clinical Social Work Placement (Signed)
   CLINICAL SOCIAL WORK PLACEMENT  NOTE  Date:  01/16/2018  Patient Details  Name: Lisa Torres MRN: [id number] Date of Birth: 1945-11-28  Clinical Social Work is seeking post-discharge placement for this patient at the [place] level of care (*CSW will initial, date and re-position this form in  chart as items are completed):  Yes   Patient/family provided with [place] Work Department's list of facilities offering this level of care within the geographic area requested by the patient (or if unable, by the patient's family).  Yes   Patient/family informed of their freedom to choose among providers that offer the needed level of care, that participate in Medicare, Medicaid or managed care program needed by the patient, have an available bed and are willing to accept the patient.  Yes   Patient/family informed of 's ownership interest in [facility] and [facility], as well as of the fact that they are under no obligation to receive care at these facilities.  PASRR submitted to EDS on       PASRR number received on 01/15/18     Existing PASRR number confirmed on       FL2 transmitted to all facilities in geographic area requested by pt/family on 01/15/18     FL2 transmitted to all facilities within larger geographic area on       Patient informed that his/her managed care company has contracts with or will negotiate with certain facilities, including the following:  [facility])     Yes   Patient/family informed of bed offers received.  Patient chooses bed at [facility])     Physician recommends and patient chooses bed at      Patient to be transferred to [facility]) on 01/16/18.  Patient to be transferred to facility by PTAR     Patient family notified on 01/16/18 of transfer.  Name of family member notified:  spouse      PHYSICIAN       Additional Comment:    _______________________________________________ Eileen Stanford, LCSW 01/16/2018, 12:04 PM

## 2018-01-17 LAB — AEROBIC/ANAEROBIC CULTURE W GRAM STAIN (SURGICAL/DEEP WOUND)

## 2018-01-17 LAB — AEROBIC/ANAEROBIC CULTURE (SURGICAL/DEEP WOUND)

## 2018-02-02 ENCOUNTER — Other Ambulatory Visit: Payer: Self-pay | Admitting: Orthopedic Surgery

## 2018-02-04 ENCOUNTER — Ambulatory Visit (INDEPENDENT_AMBULATORY_CARE_PROVIDER_SITE_OTHER): Payer: Medicare Other | Admitting: Internal Medicine

## 2018-02-04 ENCOUNTER — Encounter: Payer: Self-pay | Admitting: Internal Medicine

## 2018-02-04 ENCOUNTER — Telehealth: Payer: Self-pay

## 2018-02-04 DIAGNOSIS — T8450XA Infection and inflammatory reaction due to unspecified internal joint prosthesis, initial encounter: Secondary | ICD-10-CM | POA: Insufficient documentation

## 2018-02-04 DIAGNOSIS — T8450XD Infection and inflammatory reaction due to unspecified internal joint prosthesis, subsequent encounter: Secondary | ICD-10-CM

## 2018-02-04 DIAGNOSIS — Z5181 Encounter for therapeutic drug level monitoring: Secondary | ICD-10-CM | POA: Diagnosis not present

## 2018-02-04 DIAGNOSIS — Z452 Encounter for adjustment and management of vascular access device: Secondary | ICD-10-CM | POA: Diagnosis not present

## 2018-02-04 NOTE — Telephone Encounter (Addendum)
Corrected note: Prior to Tammy RN attempting blood draw from Picc  Line the dressing  was saturated. After removing the saturated dressing a skin tear was noted to left arm under picc line site. Patient's son present at this time and  Dr. Linus Salmons was notified.  S.Allizon Woznick, LPN  Call placed to nursing facility [facility] and spoke with facility administer Loie Lopardi and made her aware of skin tear to left arm under picc line site which occurred during picc dressing change.   Area covered and new Picc line  dressing applied. Made Loie aware that this dressing will need to be changed once patient arrived back to the facility to properly secure Picc line. Patient very appreciative of care given and tolerated dressing change well.  S.Kerrie Timm LPN

## 2018-02-05 DIAGNOSIS — Z5181 Encounter for therapeutic drug level monitoring: Secondary | ICD-10-CM | POA: Insufficient documentation

## 2018-02-05 DIAGNOSIS — Z452 Encounter for adjustment and management of vascular access device: Secondary | ICD-10-CM | POA: Insufficient documentation

## 2018-02-05 LAB — CBC WITH DIFFERENTIAL/PLATELET
BASOS PCT: 1.5 %
Basophils Absolute: 51 cells/uL (ref 0–200)
EOS ABS: 177 {cells}/uL (ref 15–500)
Eosinophils Relative: 5.2 %
HEMATOCRIT: 34.2 % — AB (ref 35.0–45.0)
HEMOGLOBIN: 11 g/dL — AB (ref 11.7–15.5)
LYMPHS ABS: 823 {cells}/uL — AB (ref 850–3900)
MCH: 27.8 pg (ref 27.0–33.0)
MCHC: 32.2 g/dL (ref 32.0–36.0)
MCV: 86.6 fL (ref 80.0–100.0)
MPV: 10.1 fL (ref 7.5–12.5)
Monocytes Relative: 9 %
NEUTROS ABS: 2043 {cells}/uL (ref 1500–7800)
Neutrophils Relative %: 60.1 %
PLATELETS: 257 10*3/uL (ref 140–400)
RBC: 3.95 10*6/uL (ref 3.80–5.10)
RDW: 12.8 % (ref 11.0–15.0)
Total Lymphocyte: 24.2 %
WBC: 3.4 10*3/uL — AB (ref 3.8–10.8)
WBCMIX: 306 {cells}/uL (ref 200–950)

## 2018-02-05 LAB — SEDIMENTATION RATE: Sed Rate: 41 mm/h — ABNORMAL HIGH (ref 0–30)

## 2018-02-05 LAB — C-REACTIVE PROTEIN: CRP: 18.2 mg/L — ABNORMAL HIGH (ref <8.0)

## 2018-02-05 NOTE — Assessment & Plan Note (Signed)
Needs more frequent dressing changes and SNF informed.

## 2018-02-05 NOTE — Assessment & Plan Note (Signed)
Doing well with no major concerns on exam.  I will check her ESR and CRP today.   She will return at the end of treatment and determine if 6 weeks is adequate.

## 2018-02-05 NOTE — Assessment & Plan Note (Signed)
Labs being monitored by the SNF.   Not available to me.  Will check a CBC now.  Creat per snf Vancomycin trough goal of 15-20

## 2018-02-05 NOTE — Progress Notes (Signed)
   Subjective:    Patient ID: Lisa Torres, female    DOB: 07/11/1945, 72 y.o.   MRN: [id number]  HPI Here for follow up of PJI Underwent resection arthroplasty for 2 stage revision on 01/12/18 by Dr. Ronnie Derby.  She had a TKA in August 2018 and recently with pain and loosening.  Aspiration of the knee revealed 17,000 WBCs in July and culture with Corynebacterium stratium.  Culture from the OR also with Corynebacterium and she was placed on vancomycin for 6 weeks through September 8th.  She is currently in a SNF.  Her complaints are that she was unaware of the appt and she has not been out of bed for 2 days.  She has no complaints of diarrhea, rash.  No associated fever or chills.    Review of Systems  Constitutional: Negative for fatigue.  Skin: Negative for rash.       Objective:   Physical Exam  Constitutional: She appears well-developed and well-nourished. No distress.  HENT:  Mouth/Throat: No oropharyngeal exudate.  Eyes: No scleral icterus.  Cardiovascular: Normal rate, regular rhythm and normal heart sounds.  No murmur heard. Pulmonary/Chest: Effort normal and breath sounds normal. No respiratory distress.  Musculoskeletal:  Some mild warmth, some edema, incision closed, no erythema  Skin: No rash noted.    SH: currently at SNF      Assessment & Plan:

## 2018-02-23 ENCOUNTER — Telehealth: Payer: Self-pay

## 2018-02-23 ENCOUNTER — Encounter: Payer: Self-pay | Admitting: Internal Medicine

## 2018-02-23 ENCOUNTER — Ambulatory Visit (INDEPENDENT_AMBULATORY_CARE_PROVIDER_SITE_OTHER): Payer: Medicare Other | Admitting: Internal Medicine

## 2018-02-23 VITALS — BP 156/84 | HR 80 | Temp 97.8°F

## 2018-02-23 DIAGNOSIS — Z23 Encounter for immunization: Secondary | ICD-10-CM | POA: Diagnosis not present

## 2018-02-23 DIAGNOSIS — T8450XD Infection and inflammatory reaction due to unspecified internal joint prosthesis, subsequent encounter: Secondary | ICD-10-CM | POA: Diagnosis not present

## 2018-02-23 DIAGNOSIS — Z452 Encounter for adjustment and management of vascular access device: Secondary | ICD-10-CM

## 2018-02-23 DIAGNOSIS — Z5181 Encounter for therapeutic drug level monitoring: Secondary | ICD-10-CM | POA: Diagnosis not present

## 2018-02-23 NOTE — Telephone Encounter (Signed)
Faxed referral to [facility] for [facility] therapy. Per Dr. Linus Salmons continue vancomycin for one week. [name] Pharmacists dosed first at 1500mg  IV x 1 dose then pharmacy to dose per protocol.   Also spoke with social worker at [facility] which patient will discharge from facility to home today and made aware of patient needing IV therapy for additional week, follow up appointment with [facility] on 03/10/18, and [facility] for [facility].  Patient made aware during office visit today. Lisa Torres

## 2018-02-23 NOTE — Progress Notes (Signed)
   Subjective:    Patient ID: Lisa Torres, female    DOB: 1946/01/10, 72 y.o.   MRN: [id number]  HPI Here for follow up of PJI Underwent resection arthroplasty for 2 stage revision on 01/12/18 by Dr. Ronnie Derby.  She had a TKA in August 2018 and recently with pain and loosening.  Aspiration of the knee revealed 17,000 WBCs in July and culture with Corynebacterium stratium.  Culture from the OR also with Corynebacterium and she was placed on vancomycin for 6 weeks.  She is currently in a SNF but leaving today.   She has no complaints of diarrhea, rash.  No associated fever or chills. Surgery for reimplantation scheduled for October 7th.     Review of Systems  Constitutional: Negative for fatigue.  Skin: Negative for rash.       Objective:   Physical Exam  Constitutional: She appears well-developed and well-nourished. No distress.  HENT:  Mouth/Throat: No oropharyngeal exudate.  Eyes: No scleral icterus.  Cardiovascular: Normal rate, regular rhythm and normal heart sounds.  No murmur heard. Pulmonary/Chest: Effort normal and breath sounds normal. No respiratory distress.  Musculoskeletal:  Knee with no warmth, no swelling, no tenderness.    Skin: No rash noted.    SH: currently at SNF      Assessment & Plan:

## 2018-02-23 NOTE — Assessment & Plan Note (Signed)
Will check creat today 

## 2018-02-23 NOTE — Assessment & Plan Note (Addendum)
At this point, she is healing well and feels well.  No concerns on exam.  I will check her inflammatory markers and if no concerns, she will continue one week and stop antibiotics next Monday then observe off of antibiotics.  Surgery scheduled for October 7th.    ADDENDUM 02/24/18: labs reviewed including inflammatory markers and no concerns, all within normal limits.  At this point she will complete antibiotics on 03/02/18 and stop and pull picc line and ok from ID standpoint to proceed with surgery on October 7th as scheduled with Dr. Ronnie Derby.  She will follow up with me in 2 weeks to confirm she is still doing well.

## 2018-02-23 NOTE — Assessment & Plan Note (Signed)
picc stable.  Will notify home health and continue one week and have picc pulled after the last dose.

## 2018-02-24 LAB — COMPREHENSIVE METABOLIC PANEL
AG Ratio: 1.2 (calc) (ref 1.0–2.5)
ALT: 12 U/L (ref 6–29)
AST: 18 U/L (ref 10–35)
Albumin: 3.9 g/dL (ref 3.6–5.1)
Alkaline phosphatase (APISO): 127 U/L (ref 33–130)
BUN: 10 mg/dL (ref 7–25)
CO2: 25 mmol/L (ref 20–32)
CREATININE: 0.72 mg/dL (ref 0.60–0.93)
Calcium: 9.2 mg/dL (ref 8.6–10.4)
Chloride: 105 mmol/L (ref 98–110)
GLUCOSE: 83 mg/dL (ref 65–99)
Globulin: 3.2 g/dL (calc) (ref 1.9–3.7)
Potassium: 3.6 mmol/L (ref 3.5–5.3)
Sodium: 139 mmol/L (ref 135–146)
Total Bilirubin: 0.3 mg/dL (ref 0.2–1.2)
Total Protein: 7.1 g/dL (ref 6.1–8.1)

## 2018-02-24 LAB — CBC WITH DIFFERENTIAL/PLATELET
BASOS PCT: 0.8 %
Basophils Absolute: 29 cells/uL (ref 0–200)
EOS ABS: 112 {cells}/uL (ref 15–500)
Eosinophils Relative: 3.1 %
HCT: 35.5 % (ref 35.0–45.0)
HEMOGLOBIN: 11.4 g/dL — AB (ref 11.7–15.5)
LYMPHS ABS: 760 {cells}/uL — AB (ref 850–3900)
MCH: 27.3 pg (ref 27.0–33.0)
MCHC: 32.1 g/dL (ref 32.0–36.0)
MCV: 84.9 fL (ref 80.0–100.0)
MONOS PCT: 8.7 %
MPV: 9.7 fL (ref 7.5–12.5)
NEUTROS ABS: 2387 {cells}/uL (ref 1500–7800)
Neutrophils Relative %: 66.3 %
Platelets: 212 10*3/uL (ref 140–400)
RBC: 4.18 10*6/uL (ref 3.80–5.10)
RDW: 12.7 % (ref 11.0–15.0)
Total Lymphocyte: 21.1 %
WBC: 3.6 10*3/uL — ABNORMAL LOW (ref 3.8–10.8)
WBCMIX: 313 {cells}/uL (ref 200–950)

## 2018-02-24 LAB — SEDIMENTATION RATE: SED RATE: 19 mm/h (ref 0–30)

## 2018-02-24 LAB — C-REACTIVE PROTEIN: CRP: 5.2 mg/L (ref <8.0)

## 2018-03-03 ENCOUNTER — Telehealth: Payer: Self-pay | Admitting: *Deleted

## 2018-03-03 NOTE — Telephone Encounter (Signed)
Rn taking care of the patient called to advise she has her last dose of medication IV today but does not see is in follow up until 03/10/18 and wants to know if it is ok to leave the line in place until visit as she is also set to have surgery in 2 weeks. Advised yes and will let the provider know. Also advised if leaving in place they will need to leave maintenance  supplies with her.   PT also in the home and advised they will need verbal orders to continue with patient for 2 more weeks as she is still weak. Advised they will need twice weekly visits for 3 weeks  To end on 03/21/18. Advised will ask the provider and give them a call back once he responds.

## 2018-03-04 ENCOUNTER — Encounter: Payer: Self-pay | Admitting: Internal Medicine

## 2018-03-04 ENCOUNTER — Encounter (HOSPITAL_COMMUNITY): Payer: Self-pay | Admitting: Emergency Medicine

## 2018-03-04 ENCOUNTER — Emergency Department (HOSPITAL_COMMUNITY)
Admission: EM | Admit: 2018-03-04 | Discharge: 2018-03-04 | Disposition: A | Discharge status: Home / Self Care | Payer: Medicare Other | Attending: Emergency Medicine | Admitting: Emergency Medicine

## 2018-03-04 ENCOUNTER — Other Ambulatory Visit: Payer: Self-pay

## 2018-03-04 DIAGNOSIS — Z452 Encounter for adjustment and management of vascular access device: Secondary | ICD-10-CM | POA: Insufficient documentation

## 2018-03-04 DIAGNOSIS — Z87891 Personal history of nicotine dependence: Secondary | ICD-10-CM | POA: Diagnosis not present

## 2018-03-04 DIAGNOSIS — Y69 Unspecified misadventure during surgical and medical care: Secondary | ICD-10-CM | POA: Insufficient documentation

## 2018-03-04 DIAGNOSIS — I10 Essential (primary) hypertension: Secondary | ICD-10-CM | POA: Insufficient documentation

## 2018-03-04 DIAGNOSIS — E039 Hypothyroidism, unspecified: Secondary | ICD-10-CM | POA: Diagnosis not present

## 2018-03-04 DIAGNOSIS — Z7982 Long term (current) use of aspirin: Secondary | ICD-10-CM | POA: Diagnosis not present

## 2018-03-04 DIAGNOSIS — Z853 Personal history of malignant neoplasm of breast: Secondary | ICD-10-CM | POA: Diagnosis not present

## 2018-03-04 DIAGNOSIS — G20 Parkinson's disease: Secondary | ICD-10-CM | POA: Insufficient documentation

## 2018-03-04 DIAGNOSIS — T829XXA Unspecified complication of cardiac and vascular prosthetic device, implant and graft, initial encounter: Secondary | ICD-10-CM

## 2018-03-04 DIAGNOSIS — Z79899 Other long term (current) drug therapy: Secondary | ICD-10-CM | POA: Diagnosis not present

## 2018-03-04 NOTE — ED Triage Notes (Signed)
Pt was sent here for evaluation of picc line. Pt states home health nurse said it looked as if PICC line was pulled out some. Pt sent here for evaluation of PICC line. In review of patient chart MD dr. Linus Salmons states pt does not need PICC line anymore and should be removed.

## 2018-03-04 NOTE — Telephone Encounter (Signed)
She should have the picc line pulled, she will not get IV antibiotics after the surgery and I would be concerned of a line infection leaving it in which would delay the surgery.  It can wait until the appt with me though if needed.   [place] for PT

## 2018-03-04 NOTE — Discharge Instructions (Addendum)
As discussed, your evaluation today has been largely reassuring.  But, it is important that you monitor your condition carefully, and do not hesitate to return to the ED if you develop new, or concerning changes in your condition. ? ?Otherwise, please follow-up with your physician for appropriate ongoing care. ? ?

## 2018-03-04 NOTE — ED Provider Notes (Signed)
[facility] EMERGENCY DEPARTMENT Provider Note   CSN: [id number] Arrival date & time: 03/04/18  1446     History   Chief Complaint Chief Complaint  Patient presents with  . Vascular Access Problem    HPI Lisa Torres is a 72 y.o. female.  HPI Patient presents with concern of disrupted right-sided PICC line. Patient has multiple medical issues, but has been using her PICC line to receive IV antibiotics due to infected prosthetic knee. She notes that she has actually just indicated that she does not require additional dosing. Patient has history of complicated knee surgery, and is scheduled for revision in 3 weeks. She notes that she tolerated her antibiotics well, but over the past day noticed that her PICC line was disturbed. She denies arm pain, fever, chills, nausea, vomiting.  She states that she feels otherwise well. She is here with her female companion who seems to corroborate the HPI.    Past Medical History:  Diagnosis Date  . Breast cancer ([place]) 11/2011  . Chronic back pain   . Chronic leg pain    bilateral  . Chronic leukopenia   . DDD (degenerative disc disease), cervical   . DDD (degenerative disc disease), lumbar   . Gait abnormality   . GERD (gastroesophageal reflux disease)   . Hypertension   . Parkinson's disease ([place])   . Radiation 04/2012   33 treatments for breast cancer  . Right knee pain   . Thyroid disease     Patient Active Problem List   Diagnosis Date Noted  . Medication monitoring encounter 02/05/2018  . PICC (peripherally inserted central catheter) in place 02/05/2018  . Prosthetic joint infection ([place]) 02/04/2018  . 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 Abdominal pain, epigastric 11/04/2017  . Breast cancer, left breast ([place]) 11/02/2015  . Difficulty walking 10/20/2013  . Parkinson disease ([place]) 08/24/2013  . Hypothyroid 05/05/2013  . Hypertension 05/05/2013    Past Surgical History:  Procedure Laterality Date  .  ABDOMINAL HYSTERECTOMY     partial  . BREAST LUMPECTOMY Left 12/2011  . CHOLECYSTECTOMY    . COLONOSCOPY N/A 07/01/2013   Procedure: COLONOSCOPY;  Surgeon: Rogene Houston, MD;  Location: [facility];  Service: Endoscopy;  Laterality: N/A;  1030  . ESOPHAGOGASTRODUODENOSCOPY N/A 11/21/2017   Procedure: ESOPHAGOGASTRODUODENOSCOPY (EGD);  Surgeon: Rogene Houston, MD;  Location: [facility];  Service: Endoscopy;  Laterality: N/A;  2:45  . EXCISIONAL TOTAL KNEE ARTHROPLASTY WITH ANTIBIOTIC SPACERS Right 01/12/2018   Procedure: EXCISIONAL TOTAL KNEE ARTHROPLASTY WITH ANTIBIOTIC SPACERS;  Surgeon: Vickey Huger, MD;  Location: [facility];  Service: Orthopedics;  Laterality: Right;  . JOINT REPLACEMENT  01/2017   right knee  . KNEE SURGERY Right 01/12/2018   EXCISIONAL TOTAL KNEE ARTHROPLASTY WITH ANTIBIOTIC SPACERS  . TUBAL LIGATION       OB History    Gravida  3   Para  3   Term  3   Preterm      AB      Living  3     SAB      TAB      Ectopic      Multiple      Live Births               Home Medications    Prior to Admission medications   Medication Sig Start Date End Date Taking? Authorizing Provider  acetaminophen (TYLENOL) 500 MG tablet Take 1,000 mg by mouth  3 (three) times daily as needed for moderate pain or headache.    Yes [provider]  amLODipine (NORVASC) 5 MG tablet Take 5 mg by mouth every morning.    Yes [provider]  aspirin EC 325 MG EC tablet Take 1 tablet (325 mg total) by mouth 2 (two) times daily. 01/13/18  Yes Donia Ast, PA  levothyroxine (SYNTHROID, LEVOTHROID) 100 MCG tablet Take 100 mcg by mouth daily before breakfast.   Yes [provider]  Melatonin 3 MG CAPS Take 3 mg by mouth at bedtime as needed (for sleep).    Yes [provider]  methocarbamol (ROBAXIN) 500 MG tablet Take 1-2 tablets (500-1,000 mg total) by mouth every 6 (six) hours as needed for muscle spasms. 01/13/18  Yes Donia Ast, PA  oxyCODONE (OXY IR/ROXICODONE) 5 MG immediate release tablet Take 1-2 tablets (5-10 mg total) by mouth every 6 (six) hours as needed for moderate pain (pain score 4-6). 01/13/18  Yes Donia Ast, PA  pantoprazole (PROTONIX) 40 MG tablet Take 1 tablet (40 mg total) by mouth daily before breakfast. 11/21/17  Yes Rehman, Mechele Dawley, MD  polyethylene glycol powder (GLYCOLAX/MIRALAX) powder Take 17 g by mouth daily as needed for mild constipation or moderate constipation.   Yes [provider]  traZODone (DESYREL) 50 MG tablet Take 50 mg by mouth at bedtime.  11/08/15  Yes [provider]  vancomycin (VANCOCIN) 10 G SOLR injection as directed.  02/27/18   [provider]    Family History Family History  Problem Relation Age of Onset  . Cancer Mother 51       colon cancer  . Cancer Father 75       throat cancer  . Pancreatic cancer Brother        [phone] 11-14-15    Social History Social History   Tobacco Use  . Smoking status: Former Smoker    Packs/day: 0.50    Years: 18.00    Pack years: 9.00    Types: Cigarettes    Last attempt to quit: 09/30/1998    Years since quitting: 19.4  . Smokeless tobacco: Never Used  Substance Use Topics  . Alcohol use: No  . Drug use: No     Allergies   Hydrocodone and Tramadol   Review of Systems Review of Systems  Constitutional:       Per HPI, otherwise negative  HENT:       Per HPI, otherwise negative  Respiratory:       Per HPI, otherwise negative  Cardiovascular:       Per HPI, otherwise negative  Gastrointestinal: Negative for vomiting.  Endocrine:       Negative aside from HPI  Genitourinary:       Neg aside from HPI   Musculoskeletal:       Per HPI, otherwise negative  Skin: Negative.   Neurological: Positive for weakness. Negative for syncope.     Physical Exam Updated Vital Signs BP (!) 141/66 (BP Location: Left Arm)   Pulse 76   Temp 98.2 F (36.8 C) (Oral)   Resp 16   Ht 5'  1.5" (1.562 m)   Wt 61.2 kg   SpO2 100%   BMI 25.09 kg/m   Physical Exam  Constitutional: She is oriented to person, place, and time. She has a sickly appearance. No distress.  HENT:  Head: Normocephalic and atraumatic.  Eyes: Conjunctivae and EOM are normal.  Cardiovascular: Normal rate  and regular rhythm.  Pulmonary/Chest: Effort normal and breath sounds normal. No stridor. No respiratory distress.  Abdominal: She exhibits no distension.  Musculoskeletal: She exhibits no edema.       Legs: Neurological: She is alert and oriented to person, place, and time. No cranial nerve deficit.  Skin: Skin is warm and dry.     Psychiatric: She has a normal mood and affect.  Nursing note and vitals reviewed.    ED Treatments / Results   Procedures Procedures (including critical care time)   After the initial evaluation and consideration with our nursing staff, the patient had successful removal of her PICC line.   Medications Ordered in ED Medications - No data to display   Initial Impression / Assessment and Plan / ED Course  I have reviewed the triage vital signs and the nursing notes.  Pertinent labs & imaging results that were available during my care of the patient were reviewed by me and considered in my medical decision making (see chart for details).  Patient with chronic inflammatory condition of her right knee status post multiple knee revisions, with preparation for additional surgery next month presents with concern of disrupted PICC line. Here the patient states that she feels otherwise generally well, is afebrile, is not hypotensive, there is low suspicion for new infection. With her infectious disease physician indicating that she no longer needs IV antibiotics, the PICC line was removed without complication, patient discharged in stable condition.  Final Clinical Impressions(s) / ED Diagnoses   Final diagnoses:  Complication of central venous catheter,  unspecified complication, initial encounter      Carmin Muskrat, MD 03/04/18 1622

## 2018-03-04 NOTE — ED Notes (Signed)
Patient states that she had knee surgery in July and that "something went wrong" during the procedure and that it has developed an infection. She has been getting IV antibiotics at the nursing facility where she lives, but that she got her last dose on Tuesday. Her right knee is still very large, about the size of a small cantaloupe, (about 4x the size of the other knee), and is very warm to the touch. She states that she is having another surgery on it October 7th at [facility].

## 2018-03-10 ENCOUNTER — Ambulatory Visit (INDEPENDENT_AMBULATORY_CARE_PROVIDER_SITE_OTHER): Payer: Medicare Other | Admitting: Internal Medicine

## 2018-03-10 ENCOUNTER — Encounter: Payer: Self-pay | Admitting: Internal Medicine

## 2018-03-10 VITALS — BP 148/82 | HR 69 | Temp 97.7°F

## 2018-03-10 DIAGNOSIS — Z452 Encounter for adjustment and management of vascular access device: Secondary | ICD-10-CM

## 2018-03-10 DIAGNOSIS — Z5181 Encounter for therapeutic drug level monitoring: Secondary | ICD-10-CM

## 2018-03-10 DIAGNOSIS — T8450XD Infection and inflammatory reaction due to unspecified internal joint prosthesis, subsequent encounter: Secondary | ICD-10-CM | POA: Diagnosis not present

## 2018-03-10 NOTE — Assessment & Plan Note (Signed)
This has been removed.

## 2018-03-10 NOTE — Assessment & Plan Note (Signed)
I will check her creatinine again today to be sure there is no significant issues

## 2018-03-10 NOTE — Progress Notes (Signed)
   Subjective:    Patient ID: Lisa Torres, female    DOB: Jul 02, 1945, 72 y.o.   MRN: [id number]  HPI Here for follow up of PJI Underwent resection arthroplasty for 2 stage revision on 01/12/18 by Dr. Ronnie Derby.  She had a TKA in August 2018 and recently with pain and loosening.  Aspiration of the knee revealed 17,000 WBCs in July and culture with Corynebacterium stratium.  Culture from the OR also with Corynebacterium and she was placed on vancomycin for 6 weeks which she completed and is now out of the SNF. No associated fever or chills. Surgery for reimplantation scheduled for October 7th.   Otherwise no new concerns with no significant increase in pain in her knee.   Review of Systems  Constitutional: Negative for fatigue.  Skin: Negative for rash.       Objective:   Physical Exam  Constitutional: She appears well-developed and well-nourished. No distress.  HENT:  Mouth/Throat: No oropharyngeal exudate.  Eyes: No scleral icterus.  Cardiovascular: Normal rate, regular rhythm and normal heart sounds.  No murmur heard. Pulmonary/Chest: Effort normal and breath sounds normal. No respiratory distress.  Musculoskeletal:  Knee with some warmth, no swelling, no tenderness.    Skin: No rash noted.    SH: currently at SNF      Assessment & Plan:

## 2018-03-10 NOTE — Assessment & Plan Note (Signed)
Previous CRP and sed rate were reassuring.  I will recheck those today.  If stable, no follow-up with me indicated and okay from an infectious disease standpoint to proceed with the reimplantation on October 7.

## 2018-03-11 LAB — COMPREHENSIVE METABOLIC PANEL
AG Ratio: 1.3 (calc) (ref 1.0–2.5)
ALBUMIN MSPROF: 4 g/dL (ref 3.6–5.1)
ALKALINE PHOSPHATASE (APISO): 125 U/L (ref 33–130)
ALT: 15 U/L (ref 6–29)
AST: 19 U/L (ref 10–35)
BUN: 11 mg/dL (ref 7–25)
CO2: 27 mmol/L (ref 20–32)
CREATININE: 0.74 mg/dL (ref 0.60–0.93)
Calcium: 9.6 mg/dL (ref 8.6–10.4)
Chloride: 105 mmol/L (ref 98–110)
Globulin: 3.2 g/dL (calc) (ref 1.9–3.7)
Glucose, Bld: 86 mg/dL (ref 65–99)
POTASSIUM: 4.1 mmol/L (ref 3.5–5.3)
Sodium: 140 mmol/L (ref 135–146)
Total Bilirubin: 0.3 mg/dL (ref 0.2–1.2)
Total Protein: 7.2 g/dL (ref 6.1–8.1)

## 2018-03-11 LAB — SEDIMENTATION RATE: Sed Rate: 11 mm/h (ref 0–30)

## 2018-03-11 LAB — C-REACTIVE PROTEIN: CRP: 2.4 mg/L (ref <8.0)

## 2018-03-12 NOTE — Progress Notes (Signed)
CMP 03-10-18 epic   CBCdiff, 02-23-18 epic   EKG, CXR 10-11-17 epic

## 2018-03-12 NOTE — Patient Instructions (Addendum)
Lisa Torres  03/12/2018   Your procedure is scheduled on: 03-23-18   Report to [facility] Main  Entrance    Report to admitting at 10:30AM    Call this number if you have problems the morning of surgery [phone]     Remember: Do not eat food After Midnight. YOU MAY HAVE CLEAR LIQUIDS FROM MIDNIGHT UNTIL 7:00AM. NOTHING BY MOUTH AFTER 7:00AM! BRUSH YOUR TEETH MORNING OF SURGERY AND RINSE YOUR MOUTH OUT, NO CHEWING GUM CANDY OR MINTS.      CLEAR LIQUID DIET   Foods Allowed                                                                     Foods Excluded  Coffee and tea, regular and decaf                             liquids that you cannot  Plain Jell-O in any flavor                                             see through such as: Fruit ices (not with fruit pulp)                                     milk, soups, orange juice  Iced Popsicles                                    All solid food Carbonated beverages, regular and diet                                    Cranberry, grape and apple juices Sports drinks like Gatorade Lightly seasoned clear broth or consume(fat free) Sugar, honey syrup  Sample Menu Breakfast                                Lunch                                     Supper Cranberry juice                    Beef broth                            Chicken broth Jell-O                                     Grape juice  Apple juice Coffee or tea                        Jell-O                                      Popsicle                                                Coffee or tea                        Coffee or tea  _____________________________________________________________________       Take these medicines the morning of surgery with A SIP OF WATER: amlodipine, levothyroxine, pantoprazole, tylenol if needed                                You may not have any metal on your body including hair  pins and              piercings  Do not wear jewelry, make-up, lotions, powders or perfumes, deodorant             Do not wear nail polish.  Do not shave  48 hours prior to surgery.             Do not bring valuables to the hospital. [place].  Contacts, dentures or bridgework may not be worn into surgery.  Leave suitcase in the car. After surgery it may be brought to your room.                 Please read over the following fact sheets you were given: _____________________________________________________________________             [facility] - Preparing for Surgery Before surgery, you can play an important role.  Because skin is not sterile, your skin needs to be as free of germs as possible.  You can reduce the number of germs on your skin by washing with CHG (chlorahexidine gluconate) soap before surgery.  CHG is an antiseptic cleaner which kills germs and bonds with the skin to continue killing germs even after washing. Please DO NOT use if you have an allergy to CHG or antibacterial soaps.  If your skin becomes reddened/irritated stop using the CHG and inform your nurse when you arrive at Short Stay. Do not shave (including legs and underarms) for at least 48 hours prior to the first CHG shower.  You may shave your face/neck. Please follow these instructions carefully:  1.  Shower with CHG Soap the night before surgery and the  morning of Surgery.  2.  If you choose to wash your hair, wash your hair first as usual with your  normal  shampoo.  3.  After you shampoo, rinse your hair and body thoroughly to remove the  shampoo.                           4.  Use CHG as you would any other liquid soap.  You can apply chg directly  to the skin and wash                       Gently with a scrungie or clean washcloth.  5.  Apply the CHG Soap to your body ONLY FROM THE NECK DOWN.   Do not use on face/ open                           Wound or  open sores. Avoid contact with eyes, ears mouth and genitals (private parts).                       Wash face,  Genitals (private parts) with your normal soap.             6.  Wash thoroughly, paying special attention to the area where your surgery  will be performed.  7.  Thoroughly rinse your body with warm water from the neck down.  8.  DO NOT shower/wash with your normal soap after using and rinsing off  the CHG Soap.                9.  Pat yourself dry with a clean towel.            10.  Wear clean pajamas.            11.  Place clean sheets on your bed the night of your first shower and do not  sleep with pets. Day of Surgery : Do not apply any lotions/deodorants the morning of surgery.  Please wear clean clothes to the hospital/surgery center.  FAILURE TO FOLLOW THESE INSTRUCTIONS MAY RESULT IN THE CANCELLATION OF YOUR SURGERY PATIENT SIGNATURE_________________________________  NURSE SIGNATURE__________________________________  ________________________________________________________________________   Adam Phenix  An incentive spirometer is a tool that can help keep your lungs clear and active. This tool measures how well you are filling your lungs with each breath. Taking long deep breaths may help reverse or decrease the chance of developing breathing (pulmonary) problems (especially infection) following:  A long period of time when you are unable to move or be active. BEFORE THE PROCEDURE   If the spirometer includes an indicator to show your best effort, your nurse or respiratory therapist will set it to a desired goal.  If possible, sit up straight or lean slightly forward. Try not to slouch.  Hold the incentive spirometer in an upright position. INSTRUCTIONS FOR USE  1. Sit on the edge of your bed if possible, or sit up as far as you can in bed or on a chair. 2. Hold the incentive spirometer in an upright position. 3. Breathe out normally. 4. Place the  mouthpiece in your mouth and seal your lips tightly around it. 5. Breathe in slowly and as deeply as possible, raising the piston or the ball toward the top of the column. 6. Hold your breath for 3-5 seconds or for as long as possible. Allow the piston or ball to fall to the bottom of the column. 7. Remove the mouthpiece from your mouth and breathe out normally. 8. Rest for a few seconds and repeat Steps 1 through 7 at least 10 times every 1-2 hours when you are awake. Take your time and take a few normal breaths between deep breaths. 9. The spirometer may include an indicator to show your best effort. Use the indicator as a goal to work  toward during each repetition. 10. After each set of 10 deep breaths, practice coughing to be sure your lungs are clear. If you have an incision (the cut made at the time of surgery), support your incision when coughing by placing a pillow or rolled up towels firmly against it. Once you are able to get out of bed, walk around indoors and cough well. You may stop using the incentive spirometer when instructed by your caregiver.  RISKS AND COMPLICATIONS  Take your time so you do not get dizzy or light-headed.  If you are in pain, you may need to take or ask for pain medication before doing incentive spirometry. It is harder to take a deep breath if you are having pain. AFTER USE  Rest and breathe slowly and easily.  It can be helpful to keep track of a log of your progress. Your caregiver can provide you with a simple table to help with this. If you are using the spirometer at home, follow these instructions: [place] IF:   You are having difficultly using the spirometer.  You have trouble using the spirometer as often as instructed.  Your pain medication is not giving enough relief while using the spirometer.  You develop fever of 100.5 F (38.1 C) or higher. SEEK IMMEDIATE MEDICAL CARE IF:   You cough up bloody sputum that had not been present  before.  You develop fever of 102 F (38.9 C) or greater.  You develop worsening pain at or near the incision site. MAKE SURE YOU:   Understand these instructions.  Will watch your condition.  Will get help right away if you are not doing well or get worse. Document Released: 10/14/2006 Document Revised: 08/26/2011 Document Reviewed: 12/15/2006 [employer] Patient Information 2014 [place], [place].   ________________________________________________________________________

## 2018-03-17 ENCOUNTER — Encounter (HOSPITAL_COMMUNITY)
Admission: RE | Admit: 2018-03-17 | Discharge: 2018-03-17 | Disposition: A | Discharge status: Home / Self Care | Payer: Medicare Other | Source: Ambulatory Visit | Attending: Orthopedic Surgery | Admitting: Orthopedic Surgery

## 2018-03-17 ENCOUNTER — Other Ambulatory Visit: Payer: Self-pay

## 2018-03-17 DIAGNOSIS — Z01812 Encounter for preprocedural laboratory examination: Secondary | ICD-10-CM | POA: Insufficient documentation

## 2018-03-17 LAB — ABO/RH: ABO/RH(D): O POS

## 2018-03-17 LAB — SURGICAL PCR SCREEN
MRSA, PCR: NEGATIVE
Staphylococcus aureus: NEGATIVE

## 2018-03-22 MED ORDER — BUPIVACAINE LIPOSOME 1.3 % IJ SUSP
20.0000 mL | Freq: Once | INTRAMUSCULAR | Status: DC
  Filled 2018-03-22: qty 20

## 2018-03-23 ENCOUNTER — Encounter (HOSPITAL_COMMUNITY): Payer: Self-pay | Admitting: *Deleted

## 2018-03-23 ENCOUNTER — Encounter (HOSPITAL_COMMUNITY)
Admission: RE | Disposition: A | Discharge status: Skilled Nursing Facility | Payer: Self-pay | Source: Ambulatory Visit | Attending: Orthopedic Surgery

## 2018-03-23 ENCOUNTER — Inpatient Hospital Stay (HOSPITAL_COMMUNITY): Payer: Medicare Other | Admitting: Certified Registered Nurse Anesthetist

## 2018-03-23 ENCOUNTER — Other Ambulatory Visit: Payer: Self-pay

## 2018-03-23 ENCOUNTER — Inpatient Hospital Stay (HOSPITAL_COMMUNITY)
Admission: RE | Admit: 2018-03-23 | Discharge: 2018-03-25 | DRG: 468 | Disposition: A | Discharge status: Skilled Nursing Facility | Payer: Medicare Other | Source: Ambulatory Visit | Attending: Orthopedic Surgery | Admitting: Orthopedic Surgery

## 2018-03-23 DIAGNOSIS — E669 Obesity, unspecified: Secondary | ICD-10-CM | POA: Diagnosis present

## 2018-03-23 DIAGNOSIS — Z6826 Body mass index (BMI) 26.0-26.9, adult: Secondary | ICD-10-CM

## 2018-03-23 DIAGNOSIS — Z885 Allergy status to narcotic agent status: Secondary | ICD-10-CM

## 2018-03-23 DIAGNOSIS — Z853 Personal history of malignant neoplasm of breast: Secondary | ICD-10-CM | POA: Diagnosis not present

## 2018-03-23 DIAGNOSIS — E039 Hypothyroidism, unspecified: Secondary | ICD-10-CM | POA: Diagnosis present

## 2018-03-23 DIAGNOSIS — Z7982 Long term (current) use of aspirin: Secondary | ICD-10-CM

## 2018-03-23 DIAGNOSIS — M549 Dorsalgia, unspecified: Secondary | ICD-10-CM | POA: Diagnosis present

## 2018-03-23 DIAGNOSIS — I10 Essential (primary) hypertension: Secondary | ICD-10-CM | POA: Diagnosis present

## 2018-03-23 DIAGNOSIS — Z90710 Acquired absence of both cervix and uterus: Secondary | ICD-10-CM | POA: Diagnosis not present

## 2018-03-23 DIAGNOSIS — Z9049 Acquired absence of other specified parts of digestive tract: Secondary | ICD-10-CM | POA: Diagnosis not present

## 2018-03-23 DIAGNOSIS — T84099A Other mechanical complication of unspecified internal joint prosthesis, initial encounter: Principal | ICD-10-CM | POA: Diagnosis present

## 2018-03-23 DIAGNOSIS — Z79890 Hormone replacement therapy (postmenopausal): Secondary | ICD-10-CM

## 2018-03-23 DIAGNOSIS — M25561 Pain in right knee: Secondary | ICD-10-CM | POA: Diagnosis present

## 2018-03-23 DIAGNOSIS — Z800 Family history of malignant neoplasm of digestive organs: Secondary | ICD-10-CM | POA: Diagnosis not present

## 2018-03-23 DIAGNOSIS — G8929 Other chronic pain: Secondary | ICD-10-CM | POA: Diagnosis present

## 2018-03-23 DIAGNOSIS — G20 Parkinson's disease: Secondary | ICD-10-CM | POA: Diagnosis present

## 2018-03-23 DIAGNOSIS — Z87891 Personal history of nicotine dependence: Secondary | ICD-10-CM

## 2018-03-23 DIAGNOSIS — Z808 Family history of malignant neoplasm of other organs or systems: Secondary | ICD-10-CM | POA: Diagnosis not present

## 2018-03-23 DIAGNOSIS — Y792 Prosthetic and other implants, materials and accessory orthopedic devices associated with adverse incidents: Secondary | ICD-10-CM | POA: Diagnosis present

## 2018-03-23 DIAGNOSIS — Z96659 Presence of unspecified artificial knee joint: Secondary | ICD-10-CM

## 2018-03-23 HISTORY — PX: TOTAL KNEE REVISION: SHX996

## 2018-03-23 LAB — CBC WITH DIFFERENTIAL/PLATELET
BASOS PCT: 0 %
Basophils Absolute: 0 10*3/uL (ref 0.0–0.1)
EOS ABS: 0.1 10*3/uL (ref 0.0–0.7)
Eosinophils Relative: 2 %
HEMATOCRIT: 39.9 % (ref 36.0–46.0)
Hemoglobin: 12.8 g/dL (ref 12.0–15.0)
Lymphocytes Relative: 27 %
Lymphs Abs: 0.8 10*3/uL (ref 0.7–4.0)
MCH: 27.9 pg (ref 26.0–34.0)
MCHC: 32.1 g/dL (ref 30.0–36.0)
MCV: 86.9 fL (ref 78.0–100.0)
MONOS PCT: 6 %
Monocytes Absolute: 0.2 10*3/uL (ref 0.1–1.0)
NEUTROS PCT: 65 %
Neutro Abs: 2 10*3/uL (ref 1.7–7.7)
Platelets: 275 10*3/uL (ref 150–400)
RBC: 4.59 MIL/uL (ref 3.87–5.11)
RDW: 13.3 % (ref 11.5–15.5)
WBC: 3.1 10*3/uL — AB (ref 4.0–10.5)

## 2018-03-23 LAB — COMPREHENSIVE METABOLIC PANEL
ALBUMIN: 4.4 g/dL (ref 3.5–5.0)
ALK PHOS: 109 U/L (ref 38–126)
ALT: 14 U/L (ref 0–44)
AST: 19 U/L (ref 15–41)
Anion gap: 5 (ref 5–15)
BILIRUBIN TOTAL: 0.7 mg/dL (ref 0.3–1.2)
BUN: 10 mg/dL (ref 8–23)
CALCIUM: 9.7 mg/dL (ref 8.9–10.3)
CO2: 30 mmol/L (ref 22–32)
Chloride: 107 mmol/L (ref 98–111)
Creatinine, Ser: 0.65 mg/dL (ref 0.44–1.00)
GFR calc Af Amer: >60 mL/min (ref >60)
GFR calc non Af Amer: >60 mL/min (ref >60)
GLUCOSE: 92 mg/dL (ref 70–99)
Potassium: 3.9 mmol/L (ref 3.5–5.1)
SODIUM: 142 mmol/L (ref 135–145)
TOTAL PROTEIN: 7.8 g/dL (ref 6.5–8.1)

## 2018-03-23 LAB — TYPE AND SCREEN
ABO/RH(D): O POS
ANTIBODY SCREEN: NEGATIVE

## 2018-03-23 SURGERY — TOTAL KNEE REVISION
Anesthesia: Monitor Anesthesia Care | Site: Knee | Laterality: Right

## 2018-03-23 MED ORDER — AMLODIPINE BESYLATE 5 MG PO TABS
5.0000 mg | ORAL_TABLET | Freq: Every morning | ORAL | Status: DC
  Administered 2018-03-24 – 2018-03-25 (×2): 5 mg via ORAL
  Filled 2018-03-23 (×2): qty 1

## 2018-03-23 MED ORDER — ZOLPIDEM TARTRATE 5 MG PO TABS: 5.0000 mg | ORAL_TABLET | Freq: Every evening | ORAL | Status: DC | PRN

## 2018-03-23 MED ORDER — ALUM & MAG HYDROXIDE-SIMETH 200-200-20 MG/5ML PO SUSP: 30.0000 mL | ORAL | Status: DC | PRN

## 2018-03-23 MED ORDER — CHLORHEXIDINE GLUCONATE 4 % EX LIQD: 60.0000 mL | Freq: Once | CUTANEOUS | Status: DC

## 2018-03-23 MED ORDER — SENNOSIDES-DOCUSATE SODIUM 8.6-50 MG PO TABS: 1.0000 | ORAL_TABLET | Freq: Every evening | ORAL | Status: DC | PRN

## 2018-03-23 MED ORDER — PROPOFOL 10 MG/ML IV BOLUS
INTRAVENOUS | Status: DC | PRN
  Administered 2018-03-23: 30 mg via INTRAVENOUS

## 2018-03-23 MED ORDER — METOCLOPRAMIDE HCL 5 MG/ML IJ SOLN: 5.0000 mg | Freq: Three times a day (TID) | INTRAMUSCULAR | Status: DC | PRN

## 2018-03-23 MED ORDER — PROPOFOL 10 MG/ML IV BOLUS
INTRAVENOUS | Status: AC
  Filled 2018-03-23: qty 40

## 2018-03-23 MED ORDER — METHOCARBAMOL 500 MG PO TABS
500.0000 mg | ORAL_TABLET | Freq: Four times a day (QID) | ORAL | Status: DC | PRN
  Administered 2018-03-24: 500 mg via ORAL
  Filled 2018-03-23: qty 1

## 2018-03-23 MED ORDER — BISACODYL 5 MG PO TBEC: 5.0000 mg | DELAYED_RELEASE_TABLET | Freq: Every day | ORAL | Status: DC | PRN

## 2018-03-23 MED ORDER — DIPHENHYDRAMINE HCL 12.5 MG/5ML PO ELIX: 12.5000 mg | ORAL_SOLUTION | ORAL | Status: DC | PRN

## 2018-03-23 MED ORDER — BUPIVACAINE IN DEXTROSE 0.75-8.25 % IT SOLN
INTRATHECAL | Status: DC | PRN
  Administered 2018-03-23: 1.5 mL via INTRATHECAL

## 2018-03-23 MED ORDER — LACTATED RINGERS IV SOLN
INTRAVENOUS | Status: DC
  Administered 2018-03-23: 1000 mL via INTRAVENOUS
  Administered 2018-03-23: 13:00:00 via INTRAVENOUS

## 2018-03-23 MED ORDER — PROPOFOL 10 MG/ML IV BOLUS
INTRAVENOUS | Status: AC
  Filled 2018-03-23: qty 20

## 2018-03-23 MED ORDER — PANTOPRAZOLE SODIUM 40 MG PO TBEC
40.0000 mg | DELAYED_RELEASE_TABLET | Freq: Every day | ORAL | Status: DC
  Administered 2018-03-24 – 2018-03-25 (×2): 40 mg via ORAL
  Filled 2018-03-23 (×2): qty 1

## 2018-03-23 MED ORDER — FENTANYL CITRATE (PF) 100 MCG/2ML IJ SOLN
50.0000 ug | INTRAMUSCULAR | Status: DC
  Administered 2018-03-23: 50 ug via INTRAVENOUS
  Filled 2018-03-23: qty 2

## 2018-03-23 MED ORDER — MIDAZOLAM HCL 2 MG/2ML IJ SOLN
1.0000 mg | INTRAMUSCULAR | Status: DC
  Administered 2018-03-23: 0.5 mg via INTRAVENOUS
  Filled 2018-03-23: qty 2

## 2018-03-23 MED ORDER — GABAPENTIN 300 MG PO CAPS
300.0000 mg | ORAL_CAPSULE | Freq: Three times a day (TID) | ORAL | Status: DC
  Administered 2018-03-23 – 2018-03-25 (×7): 300 mg via ORAL
  Filled 2018-03-23 (×7): qty 1

## 2018-03-23 MED ORDER — PHENOL 1.4 % MT LIQD: 1.0000 | OROMUCOSAL | Status: DC | PRN

## 2018-03-23 MED ORDER — ACETAMINOPHEN 500 MG PO TABS
1000.0000 mg | ORAL_TABLET | Freq: Three times a day (TID) | ORAL | Status: DC | PRN
  Administered 2018-03-25 (×2): 1000 mg via ORAL
  Filled 2018-03-23 (×2): qty 2

## 2018-03-23 MED ORDER — ONDANSETRON HCL 4 MG PO TABS: 4.0000 mg | ORAL_TABLET | Freq: Four times a day (QID) | ORAL | Status: DC | PRN

## 2018-03-23 MED ORDER — ACETAMINOPHEN 500 MG PO TABS
1000.0000 mg | ORAL_TABLET | Freq: Four times a day (QID) | ORAL | Status: AC
  Administered 2018-03-23 – 2018-03-24 (×4): 1000 mg via ORAL
  Filled 2018-03-23 (×4): qty 2

## 2018-03-23 MED ORDER — DEXAMETHASONE SODIUM PHOSPHATE 10 MG/ML IJ SOLN
8.0000 mg | Freq: Once | INTRAMUSCULAR | Status: AC
  Administered 2018-03-23: 8 mg via INTRAVENOUS

## 2018-03-23 MED ORDER — FERROUS SULFATE 325 (65 FE) MG PO TABS
325.0000 mg | ORAL_TABLET | Freq: Three times a day (TID) | ORAL | Status: DC
  Administered 2018-03-24 – 2018-03-25 (×6): 325 mg via ORAL
  Filled 2018-03-23 (×5): qty 1

## 2018-03-23 MED ORDER — BUPIVACAINE LIPOSOME 1.3 % IJ SUSP
INTRAMUSCULAR | Status: DC | PRN
  Administered 2018-03-23: 15 mL

## 2018-03-23 MED ORDER — FENTANYL CITRATE (PF) 100 MCG/2ML IJ SOLN
25.0000 ug | INTRAMUSCULAR | Status: DC | PRN
  Administered 2018-03-23 (×2): 50 ug via INTRAVENOUS

## 2018-03-23 MED ORDER — ONDANSETRON HCL 4 MG/2ML IJ SOLN
INTRAMUSCULAR | Status: AC
  Filled 2018-03-23: qty 2

## 2018-03-23 MED ORDER — LEVOTHYROXINE SODIUM 100 MCG PO TABS
100.0000 ug | ORAL_TABLET | Freq: Every day | ORAL | Status: DC
  Administered 2018-03-24 – 2018-03-25 (×2): 100 ug via ORAL
  Filled 2018-03-23 (×2): qty 1

## 2018-03-23 MED ORDER — METOCLOPRAMIDE HCL 5 MG PO TABS: 5.0000 mg | ORAL_TABLET | Freq: Three times a day (TID) | ORAL | Status: DC | PRN

## 2018-03-23 MED ORDER — SODIUM CHLORIDE 0.9 % IV SOLN
INTRAVENOUS | Status: DC
  Administered 2018-03-24: 03:00:00 via INTRAVENOUS

## 2018-03-23 MED ORDER — ONDANSETRON HCL 4 MG/2ML IJ SOLN
INTRAMUSCULAR | Status: DC | PRN
  Administered 2018-03-23: 4 mg via INTRAVENOUS

## 2018-03-23 MED ORDER — DEXAMETHASONE SODIUM PHOSPHATE 10 MG/ML IJ SOLN
INTRAMUSCULAR | Status: AC
  Filled 2018-03-23: qty 1

## 2018-03-23 MED ORDER — ACETAMINOPHEN 500 MG PO TABS
1000.0000 mg | ORAL_TABLET | Freq: Once | ORAL | Status: AC
  Administered 2018-03-23: 1000 mg via ORAL
  Filled 2018-03-23: qty 2

## 2018-03-23 MED ORDER — METHOCARBAMOL 500 MG IVPB - SIMPLE MED
INTRAVENOUS | Status: AC
  Filled 2018-03-23: qty 50

## 2018-03-23 MED ORDER — PANTOPRAZOLE SODIUM 40 MG PO TBEC: 40.0000 mg | DELAYED_RELEASE_TABLET | Freq: Every day | ORAL | Status: DC

## 2018-03-23 MED ORDER — METHOCARBAMOL 500 MG IVPB - SIMPLE MED
500.0000 mg | Freq: Four times a day (QID) | INTRAVENOUS | Status: DC | PRN
  Administered 2018-03-23: 500 mg via INTRAVENOUS
  Filled 2018-03-23: qty 50

## 2018-03-23 MED ORDER — CEFAZOLIN SODIUM-DEXTROSE 2-4 GM/100ML-% IV SOLN
2.0000 g | INTRAVENOUS | Status: AC
  Administered 2018-03-23: 2 g via INTRAVENOUS
  Filled 2018-03-23: qty 100

## 2018-03-23 MED ORDER — POLYETHYLENE GLYCOL 3350 17 G PO PACK
17.0000 g | PACK | Freq: Every day | ORAL | Status: DC | PRN
  Administered 2018-03-25: 17 g via ORAL
  Filled 2018-03-23: qty 1

## 2018-03-23 MED ORDER — CEFAZOLIN SODIUM-DEXTROSE 2-4 GM/100ML-% IV SOLN
2.0000 g | Freq: Four times a day (QID) | INTRAVENOUS | Status: AC
  Administered 2018-03-23 – 2018-03-24 (×2): 2 g via INTRAVENOUS
  Filled 2018-03-23 (×2): qty 100

## 2018-03-23 MED ORDER — MENTHOL 3 MG MT LOZG: 1.0000 | LOZENGE | OROMUCOSAL | Status: DC | PRN

## 2018-03-23 MED ORDER — SODIUM CHLORIDE 0.9 % IR SOLN
Status: DC | PRN
  Administered 2018-03-23: 2000 mL

## 2018-03-23 MED ORDER — TRANEXAMIC ACID 1000 MG/10ML IV SOLN
1000.0000 mg | Freq: Once | INTRAVENOUS | Status: AC
  Administered 2018-03-23: 1000 mg via INTRAVENOUS
  Filled 2018-03-23: qty 1000

## 2018-03-23 MED ORDER — PROPOFOL 500 MG/50ML IV EMUL
INTRAVENOUS | Status: DC | PRN
  Administered 2018-03-23: 75 ug/kg/min via INTRAVENOUS

## 2018-03-23 MED ORDER — BUPIVACAINE-EPINEPHRINE 0.5% -1:200000 IJ SOLN
INTRAMUSCULAR | Status: DC | PRN
  Administered 2018-03-23: 15 mL

## 2018-03-23 MED ORDER — FLEET ENEMA 7-19 GM/118ML RE ENEM: 1.0000 | ENEMA | Freq: Once | RECTAL | Status: DC | PRN

## 2018-03-23 MED ORDER — FENTANYL CITRATE (PF) 100 MCG/2ML IJ SOLN
INTRAMUSCULAR | Status: AC
  Filled 2018-03-23: qty 2

## 2018-03-23 MED ORDER — ONDANSETRON HCL 4 MG/2ML IJ SOLN: 4.0000 mg | Freq: Once | INTRAMUSCULAR | Status: DC | PRN

## 2018-03-23 MED ORDER — GABAPENTIN 300 MG PO CAPS
300.0000 mg | ORAL_CAPSULE | Freq: Once | ORAL | Status: AC
  Administered 2018-03-23: 300 mg via ORAL
  Filled 2018-03-23: qty 1

## 2018-03-23 MED ORDER — ONDANSETRON HCL 4 MG/2ML IJ SOLN: 4.0000 mg | Freq: Four times a day (QID) | INTRAMUSCULAR | Status: DC | PRN

## 2018-03-23 MED ORDER — HYDROMORPHONE HCL 1 MG/ML IJ SOLN: 0.5000 mg | INTRAMUSCULAR | Status: DC | PRN

## 2018-03-23 MED ORDER — DEXAMETHASONE SODIUM PHOSPHATE 10 MG/ML IJ SOLN
10.0000 mg | Freq: Once | INTRAMUSCULAR | Status: AC
  Administered 2018-03-24: 10 mg via INTRAVENOUS
  Filled 2018-03-23: qty 1

## 2018-03-23 MED ORDER — TRAZODONE HCL 50 MG PO TABS: 50.0000 mg | ORAL_TABLET | Freq: Every evening | ORAL | Status: DC | PRN

## 2018-03-23 MED ORDER — DOCUSATE SODIUM 100 MG PO CAPS
100.0000 mg | ORAL_CAPSULE | Freq: Two times a day (BID) | ORAL | Status: DC
  Administered 2018-03-23 – 2018-03-25 (×4): 100 mg via ORAL
  Filled 2018-03-23 (×4): qty 1

## 2018-03-23 MED ORDER — ASPIRIN EC 325 MG PO TBEC
325.0000 mg | DELAYED_RELEASE_TABLET | Freq: Two times a day (BID) | ORAL | Status: DC
  Administered 2018-03-24 – 2018-03-25 (×3): 325 mg via ORAL
  Filled 2018-03-23 (×3): qty 1

## 2018-03-23 MED ORDER — SODIUM CHLORIDE 0.9 % IJ SOLN
INTRAMUSCULAR | Status: DC | PRN
  Administered 2018-03-23: 20 mL

## 2018-03-23 MED ORDER — OXYCODONE HCL 5 MG PO TABS
5.0000 mg | ORAL_TABLET | ORAL | Status: DC | PRN
  Administered 2018-03-23 – 2018-03-25 (×4): 5 mg via ORAL
  Filled 2018-03-23 (×5): qty 1

## 2018-03-23 MED ORDER — TRANEXAMIC ACID 1000 MG/10ML IV SOLN
1000.0000 mg | INTRAVENOUS | Status: AC
  Administered 2018-03-23: 1000 mg via INTRAVENOUS
  Filled 2018-03-23: qty 10

## 2018-03-23 SURGICAL SUPPLY — 71 items
ARTI SURFACE ZIMMER (Orthopedic Implant) ×3 IMPLANT
BAG SPEC THK2 15X12 ZIP CLS (MISCELLANEOUS) ×1
BAG ZIPLOCK 12X15 (MISCELLANEOUS) ×3 IMPLANT
BANDAGE ACE 6X5 VEL STRL LF (GAUZE/BANDAGES/DRESSINGS) ×3 IMPLANT
BLADE 10 SAFETY STRL DISP (BLADE) ×4 IMPLANT
BLADE SAW SGTL 18X1.27X75 (BLADE) ×1 IMPLANT
BLADE SAW SGTL 18X1.27X75MM (BLADE) ×1
BLADE SURG SZ11 CARB STEEL (BLADE) IMPLANT
BOWL SMART MIX CTS (DISPOSABLE) ×3 IMPLANT
CEMENT BONE TOBRAMYCIN 40GM (Cement) ×4 IMPLANT
CMPNT FEM C 60X53.5STSCR (Orthopedic Implant) ×1 IMPLANT
COMPONENT FEM C 60X53.5STSCR (Orthopedic Implant) IMPLANT
COVER SURGICAL LIGHT HANDLE (MISCELLANEOUS) ×3 IMPLANT
CUFF TOURN SGL QUICK 34 (TOURNIQUET CUFF) ×3
CUFF TOURN SGL QUICK 44 (TOURNIQUET CUFF) IMPLANT
CUFF TRNQT CYL 34X4X40X1 (TOURNIQUET CUFF) IMPLANT
DECANTER SPIKE VIAL GLASS SM (MISCELLANEOUS) ×6 IMPLANT
DRAPE ORTHO SPLIT 77X108 STRL (DRAPES) ×6
DRAPE SURG ORHT 6 SPLT 77X108 (DRAPES) ×2 IMPLANT
DRAPE U-SHAPE 47X51 STRL (DRAPES) ×3 IMPLANT
DRSG AQUACEL AG ADV 3.5X10 (GAUZE/BANDAGES/DRESSINGS) ×2 IMPLANT
DRSG PAD ABDOMINAL 8X10 ST (GAUZE/BANDAGES/DRESSINGS) ×3 IMPLANT
DURAPREP 26ML APPLICATOR (WOUND CARE) ×5 IMPLANT
ELECT REM PT RETURN 15FT ADLT (MISCELLANEOUS) ×3 IMPLANT
EVACUATOR 1/8 PVC DRAIN (DRAIN) ×3 IMPLANT
FACESHIELD WRAPAROUND (MASK) IMPLANT
FACESHIELD WRAPAROUND OR TEAM (MASK) ×3 IMPLANT
FEMORAL COMP ZIMMER (Orthopedic Implant) ×3 IMPLANT
GAUZE XEROFORM 5X9 LF (GAUZE/BANDAGES/DRESSINGS) ×3 IMPLANT
GLOVE BIO SURGEON STRL SZ7.5 (GLOVE) ×1 IMPLANT
GLOVE BIO SURGEON STRL SZ8.5 (GLOVE) ×3 IMPLANT
GLOVE SURG ORTHO 8.0 STRL STRW (GLOVE) ×7 IMPLANT
HANDPIECE INTERPULSE COAX TIP (DISPOSABLE) ×3
HOLDER FOLEY CATH W/STRAP (MISCELLANEOUS) IMPLANT
HOOD PEEL AWAY FLYTE STAYCOOL (MISCELLANEOUS) ×8 IMPLANT
KIT BASIN OR (CUSTOM PROCEDURE TRAY) ×3 IMPLANT
MANIFOLD NEPTUNE II (INSTRUMENTS) ×3 IMPLANT
NDL MA TROC 1/2 (NEEDLE) ×1 IMPLANT
NDL SAFETY ECLIPSE 18X1.5 (NEEDLE) IMPLANT
NDL SPNL 18GX3.5 QUINCKE PK (NEEDLE) IMPLANT
NEEDLE HYPO 18GX1.5 SHARP (NEEDLE)
NEEDLE MA TROC 1/2 (NEEDLE) IMPLANT
NEEDLE SPNL 18GX3.5 QUINCKE PK (NEEDLE) ×3 IMPLANT
NEX PRCOAT STEM TIBIA PLAT SZ2 (Joint) ×3 IMPLANT
NEXGEN COMPLETE KNEE SOLUTION LEGACY KNEE-CONSTRAI (Knees) ×2 IMPLANT
NS IRRIG 1000ML POUR BTL (IV SOLUTION) ×3 IMPLANT
PACK TOTAL KNEE CUSTOM (KITS) ×3 IMPLANT
PAD CAST 4YDX4 CTTN HI CHSV (CAST SUPPLIES) ×1 IMPLANT
PADDING CAST COTTON 4X4 STRL (CAST SUPPLIES)
PADDING CAST COTTON 6X4 STRL (CAST SUPPLIES) ×1 IMPLANT
POSITIONER SURGICAL ARM (MISCELLANEOUS) ×3 IMPLANT
SET HNDPC FAN SPRY TIP SCT (DISPOSABLE) ×1 IMPLANT
SRFC ARTC 1-2 C-D MIC 58X40X10 (Orthopedic Implant) ×1 IMPLANT
STAPLER VISISTAT 35W (STAPLE) ×4 IMPLANT
STEM ST EXT ZIER 100X145X12X (Stem) IMPLANT
STEM ST EXT ZIER 100X145X16X (Stem) IMPLANT
STEM ST EXT ZIMMER (Stem) ×6 IMPLANT
STEM TIBIA NEX PRCOAT PLAT SZ2 (Joint) IMPLANT
SURFACE ARTC 1-2 C-D 58X40X10 (Orthopedic Implant) IMPLANT
SUT TICRON (SUTURE) ×12 IMPLANT
SUT VIC AB 1 CT1 27 (SUTURE) ×9
SUT VIC AB 1 CT1 27XBRD ANTBC (SUTURE) ×3 IMPLANT
SUT VIC AB 2-0 CT1 27 (SUTURE) ×9
SUT VIC AB 2-0 CT1 TAPERPNT 27 (SUTURE) ×3 IMPLANT
SYR 3ML LL SCALE MARK (SYRINGE) IMPLANT
TRAY FOLEY MTR SLVR 16FR STAT (SET/KITS/TRAYS/PACK) ×3 IMPLANT
WATER STERILE IRR 1000ML POUR (IV SOLUTION) ×3 IMPLANT
WRAP KNEE MAXI GEL POST OP (GAUZE/BANDAGES/DRESSINGS) ×2 IMPLANT
ZIMMER LEGACY KNE FEMORAL COMPONENT (Knees) ×2 IMPLANT
ZIMMER NEXGEN KNEE SOLUTION STEM EXTENSION (Knees) ×2 IMPLANT
ZIMMER STEMMED TIBIAL COMPONENT (Knees) ×2 IMPLANT

## 2018-03-23 NOTE — Anesthesia Procedure Notes (Signed)
Anesthesia Regional Block: Adductor canal block   Pre-Anesthetic Checklist: ,, timeout performed, Correct Patient, Correct Site, Correct Laterality, Correct Procedure, Correct Position, site marked, Risks and benefits discussed, pre-op evaluation,  At surgeon's request and post-op pain management  Laterality: Right  Prep: Maximum Sterile Barrier Precautions used, chloraprep       Needles:  Injection technique: Single-shot  Needle Type: Echogenic Stimulator Needle     Needle Length: 9cm  Needle Gauge: 21     Additional Needles:   Procedures:,,,, ultrasound used (permanent image in chart),,,,  Narrative:  Start time: 03/23/2018 11:55 AM End time: 03/23/2018 12:05 PM Injection made incrementally with aspirations every 5 mL.  Performed by: Personally  Anesthesiologist: Roberts Gaudy, MD  Additional Notes: 15 cc 0.75% Ropivacaine with 0.2 mg clonidine injected easily

## 2018-03-23 NOTE — H&P (Signed)
Lisa Torres MRN:  [id number] DOB/SEX:  August 19, 1945/female  CHIEF COMPLAINT:  Painful right Knee  HISTORY: Patient is a 72 y.o. female presented with a history of pain in the right knee. Onset of symptoms was gradual starting a few years ago with gradually worsening course since that time. Prior procedures on the knee include arthroplasty. Patient has been treated conservatively with over-the-counter NSAIDs and activity modification. Patient currently rates pain in the knee at 10 out of 10 with activity. There is pain at night.  PAST MEDICAL HISTORY: Patient Active Problem List   Diagnosis Date Noted  . Medication monitoring encounter 02/05/2018  . PICC (peripherally inserted central catheter) in place 02/05/2018  . Prosthetic joint infection ([place]) 02/04/2018  . 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 Abdominal pain, epigastric 11/04/2017  . Breast cancer, left breast ([place]) 11/02/2015  . Difficulty walking 10/20/2013  . Parkinson disease ([place]) 08/24/2013  . Hypothyroid 05/05/2013  . Hypertension 05/05/2013   Past Medical History:  Diagnosis Date  . Breast cancer ([place]) 11/2011  . Chronic back pain   . Chronic leg pain    bilateral  . Chronic leukopenia   . DDD (degenerative disc disease), cervical   . DDD (degenerative disc disease), lumbar   . Gait abnormality   . GERD (gastroesophageal reflux disease)   . Hypertension   . Parkinson's disease ([place])   . Radiation 04/2012   33 treatments for breast cancer  . Right knee pain   . Thyroid disease    Past Surgical History:  Procedure Laterality Date  . ABDOMINAL HYSTERECTOMY     partial  . BREAST LUMPECTOMY Left 12/2011  . CHOLECYSTECTOMY    . COLONOSCOPY N/A 07/01/2013   Procedure: COLONOSCOPY;  Surgeon: Rogene Houston, MD;  Location: [facility];  Service: Endoscopy;  Laterality: N/A;  1030  . ESOPHAGOGASTRODUODENOSCOPY N/A 11/21/2017   Procedure: ESOPHAGOGASTRODUODENOSCOPY (EGD);  Surgeon: Rogene [place], MD;   Location: [facility];  Service: Endoscopy;  Laterality: N/A;  2:45  . EXCISIONAL TOTAL KNEE ARTHROPLASTY WITH ANTIBIOTIC SPACERS Right 01/12/2018   Procedure: EXCISIONAL TOTAL KNEE ARTHROPLASTY WITH ANTIBIOTIC SPACERS;  Surgeon: Vickey Huger, MD;  Location: [facility];  Service: Orthopedics;  Laterality: Right;  . JOINT REPLACEMENT  01/2017   right knee  . KNEE SURGERY Right 01/12/2018   EXCISIONAL TOTAL KNEE ARTHROPLASTY WITH ANTIBIOTIC SPACERS  . TUBAL LIGATION       MEDICATIONS:   No medications prior to admission.    ALLERGIES:   Allergies  Allergen Reactions  . Hydrocodone Other (See Comments)    Heart pain  Delusions  . Tramadol Other (See Comments)    Hallucinations, abd pain Delusions (intolerance)    REVIEW OF SYSTEMS:  A comprehensive review of systems was negative except for: Musculoskeletal: positive for arthralgias and bone pain   FAMILY HISTORY:   Family History  Problem Relation Age of Onset  . Cancer Mother 29       colon cancer  . Cancer Father 75       throat cancer  . Pancreatic cancer Brother        63y 11-14-15    SOCIAL HISTORY:   Social History   Tobacco Use  . Smoking status: Former Smoker    Packs/day: 0.50    Years: 18.00    Pack years: 9.00    Types: Cigarettes    Last attempt to quit: 09/30/1998    Years since quitting: 19.4  . Smokeless tobacco: Never Used  Substance Use Topics  . Alcohol use: No     EXAMINATION:  Vital signs in last 24 hours:    There were no vitals taken for this visit.  General Appearance:    Alert, cooperative, no distress, appears stated age  Head:    Normocephalic, without obvious abnormality, atraumatic  Eyes:    PERRL, conjunctiva/corneas clear, EOM's intact, fundi    benign, both eyes  Ears:    Normal TM's and external ear canals, both ears  Nose:   Nares normal, septum midline, mucosa normal, no drainage    or sinus tenderness  Throat:   Lips, mucosa, and tongue normal; teeth and gums normal   Neck:   Supple, symmetrical, trachea midline, no adenopathy;    thyroid:  no enlargement/tenderness/nodules; no carotid   bruit or JVD  Back:     Symmetric, no curvature, ROM normal, no CVA tenderness  Lungs:     Clear to auscultation bilaterally, respirations unlabored  Chest Wall:    No tenderness or deformity   Heart:    Regular rate and rhythm, S1 and S2 normal, no murmur, rub   or gallop  Breast Exam:    No tenderness, masses, or nipple abnormality  Abdomen:     Soft, non-tender, bowel sounds active all four quadrants,    no masses, no organomegaly  Genitalia:    Normal female without lesion, discharge or tenderness  Rectal:    Normal tone, no masses or tenderness;   guaiac negative stool  Extremities:   Extremities normal, atraumatic, no cyanosis or edema  Pulses:   2+ and symmetric all extremities  Skin:   Skin color, texture, turgor normal, no rashes or lesions  Lymph nodes:   Cervical, supraclavicular, and axillary nodes normal  Neurologic:   CNII-XII intact, normal strength, sensation and reflexes    throughout    Musculoskeletal:  ROM -10-90, Ligaments intact,  Imaging Review Plain radiographs demonstrate s/p excision arthroplasty of the right knee. The overall alignment is neutral. The bone quality appears to be good for age and reported activity level.  Assessment/Plan: S/p excision arthroplasty, infected TKA  The patient history, physical examination and imaging studies are consistent with excision arthroplasty  of the right knee. The patient has failed conservative treatment.  The clearance notes were reviewed.  After discussion with the patient it was felt that Total Knee Revision was indicated. The procedure,  risks, and benefits of total knee Revision were presented and reviewed. The risks including but not limited to aseptic loosening, infection, blood clots, vascular injury, stiffness, patella tracking problems complications among others were discussed. The patient  acknowledged the explanation, agreed to proceed with the plan.  Preoperative templating of the joint replacement has been completed, documented, and submitted to the Operating Room personnel in order to optimize intra-operative equipment management.   Anticipated LOS equal to or greater than 2 midnights due to - Age 37 and older with one or more of the following:  - Obesity  - Expected need for hospital services (PT, OT, Nursing) required for safe  discharge  - Anticipated need for postoperative skilled nursing care or inpatient rehab     Donia Ast 03/23/2018, 6:09 AM

## 2018-03-23 NOTE — Anesthesia Postprocedure Evaluation (Signed)
Anesthesia Post Note  Patient: Lisa Torres  Procedure(s) Performed: RIGHT TOTAL KNEE REVISION (Right Knee)     Patient location during evaluation: PACU Anesthesia Type: MAC Level of consciousness: oriented and awake and alert Pain management: pain level controlled Vital Signs Assessment: post-procedure vital signs reviewed and stable Respiratory status: spontaneous breathing, respiratory function stable and patient connected to nasal cannula oxygen Cardiovascular status: blood pressure returned to baseline and stable Postop Assessment: no headache, no backache and no apparent nausea or vomiting Anesthetic complications: no    Last Vitals:  Vitals:   03/23/18 1545 03/23/18 1600  BP: (!) 163/77 (!) 145/71  Pulse: 63 (!) 57  Resp: 11 15  Temp:  36.7 C  SpO2: 100% 99%    Last Pain:  Vitals:   03/23/18 1600  TempSrc:   PainSc: 3                  Tashara Suder COKER

## 2018-03-23 NOTE — Transfer of Care (Signed)
Immediate Anesthesia Transfer of Care Note  Patient: Lisa Torres  Procedure(s) Performed: RIGHT TOTAL KNEE REVISION (Right Knee)  Patient Location: PACU  Anesthesia Type:Spinal and MAC combined with regional for post-op pain  Level of Consciousness: awake, alert , oriented and patient cooperative  Airway & Oxygen Therapy: Patient Spontanous Breathing and Patient connected to face mask oxygen  Post-op Assessment: Report given to RN and Post -op Vital signs reviewed and stable  Post vital signs: Reviewed and stable  Last Vitals:  Vitals Value Taken Time  BP    Temp    Pulse 52 03/23/2018  2:49 PM  Resp 10 03/23/2018  2:49 PM  SpO2 100 % 03/23/2018  2:49 PM  Vitals shown include unvalidated device data.  Last Pain:  Vitals:   03/23/18 1205  TempSrc:   PainSc: 0-No pain         Complications: No apparent anesthesia complications

## 2018-03-23 NOTE — Progress Notes (Signed)
AssistedDr. Joslin with right, ultrasound guided, adductor canal block. Side rails up, monitors on throughout procedure. See vital signs in flow sheet. Tolerated Procedure well.  

## 2018-03-23 NOTE — Anesthesia Procedure Notes (Signed)
Spinal  Patient location during procedure: OR Start time: 03/23/2018 12:20 PM Staffing Resident/CRNA: [place] Tracye Szuch C, CRNA Performed: resident/CRNA  Preanesthetic Checklist Completed: patient identified, site marked, surgical consent, pre-op evaluation, timeout performed, IV checked, risks and benefits discussed and monitors and equipment checked Spinal Block Prep: site prepped and draped and DuraPrep Patient monitoring: heart rate, cardiac monitor, continuous pulse ox and blood pressure Approach: midline Location: L3-4 Injection technique: single-shot Needle Needle type: Pencan  Needle gauge: 24 G Assessment Sensory level: T6 Additional Notes Spinal kit date/exp checked, TO performed.  Pt tolerated procedure well VSS

## 2018-03-23 NOTE — Anesthesia Preprocedure Evaluation (Signed)
Anesthesia Evaluation  Patient identified by MRN, date of birth, ID band Patient awake    Reviewed: NPO status , Patient's Chart, lab work & pertinent test results  Airway Mallampati: II  TM Distance: >3 FB     Dental  (+) Edentulous Upper, Edentulous Lower   Pulmonary former smoker,    breath sounds clear to auscultation       Cardiovascular hypertension,  Rhythm:Regular Rate:Normal     Neuro/Psych    GI/Hepatic   Endo/Other    Renal/GU      Musculoskeletal   Abdominal   Peds  Hematology   Anesthesia Other Findings   Reproductive/Obstetrics                             Anesthesia Physical Anesthesia Plan  ASA: III  Anesthesia Plan: MAC and Spinal   Post-op Pain Management:  Regional for Post-op pain   Induction: Intravenous  PONV Risk Score and Plan: Ondansetron and Dexamethasone  Airway Management Planned: Natural Airway and Simple Face Mask  Additional Equipment:   Intra-op Plan:   Post-operative Plan:   Informed Consent: I have reviewed the patients History and Physical, chart, labs and discussed the procedure including the risks, benefits and alternatives for the proposed anesthesia with the patient or authorized representative who has indicated his/her understanding and acceptance.     Plan Discussed with: CRNA and Anesthesiologist  Anesthesia Plan Comments:         Anesthesia Quick Evaluation

## 2018-03-23 NOTE — Evaluation (Signed)
Physical Therapy Evaluation Patient Details Name: Lisa Torres MRN: [id number] DOB: 1946-05-20 Today's Date: 03/23/2018   History of Present Illness  72 YO female s/p R TKR revision, R TKR and resection/spacer previously on 01/16/18. Pt with significant PMH of Parkinson's disease, HTN, DDD, chronic leukopenia, chronic leg and back pain, breast CA, several R knee surgeries.  Clinical Impression   Pt presents with severe bilateral knee ROM deficits, generalized weakness, difficulty with all mobility tasks, poor standing balance, and unsteadiness requiring PT support when performing transfers. Pt to benefit from acute PT to address deficits. Pt tolerated stand pivot to chair but required mod assist +2 to support pt. Pt unsteady in standing, and crouched position of LEs increased with increased time in standing. Pt reports this is baseline mobility for her. Unsure of pt's history, given she stated that she ambulates in the home 10 ft at a time and did not tell PT that she uses w/c for mobility. PT agreeing with SNF recommendation at this time, will continue to follow acutely and progress mobility as tolerated. Pt to benefit from transfer board training next session.     Follow Up Recommendations Follow surgeon's recommendation for DC plan and follow-up therapies;Supervision for mobility/OOB(SNF)    Equipment Recommendations  None recommended by PT    Recommendations for Other Services       Precautions / Restrictions Precautions Precautions: Fall;Knee Restrictions Weight Bearing Restrictions: No Other Position/Activity Restrictions: WBAT       Mobility  Bed Mobility Overal bed mobility: Needs Assistance Bed Mobility: Supine to Sit     Supine to sit: Min assist;HOB elevated     General bed mobility comments: Min assist for scooting to EOB. Very increased time and effort to get to EOB.   Transfers Overall transfer level: Needs assistance Equipment used: Rolling walker (2  wheeled) Transfers: Sit to/from [facility] Sit to Stand: From elevated surface;Mod assist;+2 physical assistance;+2 safety/equipment Stand pivot transfers: Mod assist;From elevated surface;+2 safety/equipment;+2 physical assistance       General transfer comment: Sit to stand x2. First sit to stand, mod assist for power up and steadying in standing. Pt unable to stand fully erect due to bilat knee flexion (lacking 20* knee extension). Pt sat back down, pt stating this is baseline. PT reattempted sit to stand, stand pivot transfer to the chair with mod assist for trunk support/maintaining upright/steadying. Pt relying on PT assist for maintenance of standing.   Ambulation/Gait Ambulation/Gait assistance: (Deferred, pt not standing erect enough for ambulation )              Stairs            Wheelchair Mobility    Modified Rankin (Stroke Patients Only)       Balance Overall balance assessment: Needs assistance Sitting-balance support: Bilateral upper extremity supported;Feet supported Sitting balance-Leahy Scale: Fair Sitting balance - Comments: able to sit unsupported for a few seconds, will not tolerate challenge    Standing balance support: Bilateral upper extremity supported Standing balance-Leahy Scale: Poor Standing balance comment: relies on PT, UE support in standing                              Pertinent Vitals/Pain Pain Assessment: 0-10 Pain Score: 7  Pain Location: R knee, pt also with mild headache that has been present for hours Pain Descriptors / Indicators: Sore Pain Intervention(s): Limited activity within patient's tolerance;Repositioned;Monitored during session;Ice applied  Home Living Family/patient expects to be discharged to:: Skilled nursing facility Living Arrangements: Spouse/significant other                    Prior Function Level of Independence: Needs assistance   Gait / Transfers Assistance  Needed: Reports using RW for short distance mobility, no more than 10 ft. Pt also reports using furniture for [place] in home. Pt reports using transfer board to transfer from bed to chair or bedside commode at times. Pt states she has rollator but she does not like to use it, prefers to use RW.    ADL's / Homemaking Assistance Needed: needs assist, bathing/dressing per OT note 2 months ago   Comments: Per previous note 2 months ago, "Per pt, she walked "some" with RW, furniture walking, or cane, she bumps up the steps for home entry on her bottom and then two people pull her up to the [employer].  She at times also uses the [facility] to mobilize around the home.  No family present to report accuracy." Pt gave no indication this session that she uses w/c for mobility.       Hand Dominance   Dominant Hand: Right    Extremity/Trunk Assessment   Upper Extremity Assessment Upper Extremity Assessment: Generalized weakness    Lower Extremity Assessment Lower Extremity Assessment: Generalized weakness(Bilat knees lacking approximately 20* knee extension. Pt able to perform R quad set to get to lacking 10* knee extension but unable to maintain. )    Cervical / Trunk Assessment Cervical / Trunk Assessment: Kyphotic  Communication   Communication: No difficulties  Cognition Arousal/Alertness: Awake/alert   Overall Cognitive Status: No family/caregiver present to determine baseline cognitive functioning                                 General Comments: Pt did not tell PT she uses w/c at home, listed in other notes from 2 months ago. PT unsure if pt truly is ambulating at home, given posture and unsteadiness in standing.       General Comments General comments (skin integrity, edema, etc.): Pt with difficulty moving LEs in standing, suspected due to Parkinson's disease. Pt reports she is no longer taking PD medications. Pt also with hamstring tightness limiting knee extension, lacking 20*  bilat. No tremor noted during session    Exercises Total Joint Exercises Ankle Circles/Pumps: AROM;Both;10 reps;Seated Goniometric ROM: lacking 20* knee extension bilaterally. Able to get to lacking 10* knee extension with quad set.    Assessment/Plan    PT Assessment Patient needs continued PT services  PT Problem List Decreased strength;Pain;Decreased range of motion;Decreased cognition;Decreased activity tolerance;Decreased knowledge of use of DME;Decreased balance;Decreased safety awareness;Decreased mobility       PT Treatment Interventions DME instruction;Therapeutic activities;Gait training;Therapeutic exercise;Patient/family education;Balance training;Functional mobility training    PT Goals (Current goals can be found in the Care Plan section)  Acute Rehab PT Goals Patient Stated Goal: walk household distances  PT Goal Formulation: With patient Time For Goal Achievement: 04/06/18 Potential to Achieve Goals: Fair    Frequency 7X/week   Barriers to discharge        Co-evaluation               AM-PAC PT "6 Clicks" Daily Activity  Outcome Measure Difficulty turning over in bed (including adjusting bedclothes, sheets and blankets)?: Unable Difficulty moving from lying on back to sitting on the side  of the bed? : Unable Difficulty sitting down on and standing up from a chair with arms (e.g., wheelchair, bedside commode, etc,.)?: Unable Help needed moving to and from a bed to chair (including a wheelchair)?: A Lot Help needed walking in hospital room?: Total Help needed climbing 3-5 steps with a railing? : Total 6 Click Score: 7    End of Session Equipment Utilized During Treatment: Gait belt Activity Tolerance: Other (comment)(Limited by knee flexion, weakness, unsteadiness in standing ) Patient left: in chair;with chair alarm set;with call bell/phone within reach;with SCD's reapplied Nurse Communication: Mobility status(Nurse tech told to inform RN of status, RN  busy in another room ) PT Visit Diagnosis: Unsteadiness on feet (R26.81);Difficulty in walking, not elsewhere classified (R26.2)    Time: 1779-3903 PT Time Calculation (min) (ACUTE ONLY): 25 min   Charges:   PT Evaluation $PT Eval Low Complexity: 1 Low PT Treatments $Therapeutic Activity: 8-22 mins        Julien Girt, PT Acute Rehabilitation Services Pager [phone]  Office [phone]   Kyson Kupper D Azaliah Carrero 03/23/2018, 8:14 PM

## 2018-03-24 ENCOUNTER — Encounter (HOSPITAL_COMMUNITY): Payer: Self-pay | Admitting: Orthopedic Surgery

## 2018-03-24 LAB — BASIC METABOLIC PANEL
ANION GAP: 9 (ref 5–15)
BUN: 10 mg/dL (ref 8–23)
CHLORIDE: 106 mmol/L (ref 98–111)
CO2: 24 mmol/L (ref 22–32)
Calcium: 9 mg/dL (ref 8.9–10.3)
Creatinine, Ser: 0.6 mg/dL (ref 0.44–1.00)
Glucose, Bld: 105 mg/dL — ABNORMAL HIGH (ref 70–99)
POTASSIUM: 4.4 mmol/L (ref 3.5–5.1)
SODIUM: 139 mmol/L (ref 135–145)

## 2018-03-24 LAB — CBC
HEMATOCRIT: 32.3 % — AB (ref 36.0–46.0)
Hemoglobin: 10.7 g/dL — ABNORMAL LOW (ref 12.0–15.0)
MCH: 28.4 pg (ref 26.0–34.0)
MCHC: 33.1 g/dL (ref 30.0–36.0)
MCV: 85.7 fL (ref 80.0–100.0)
Platelets: 206 10*3/uL (ref 150–400)
RBC: 3.77 MIL/uL — AB (ref 3.87–5.11)
RDW: 13.2 % (ref 11.5–15.5)
WBC: 8.4 10*3/uL (ref 4.0–10.5)

## 2018-03-24 LAB — URINALYSIS, ROUTINE W REFLEX MICROSCOPIC
Bilirubin Urine: NEGATIVE
Glucose, UA: NEGATIVE mg/dL
Hgb urine dipstick: NEGATIVE
KETONES UR: NEGATIVE mg/dL
LEUKOCYTES UA: NEGATIVE
NITRITE: NEGATIVE
PH: 6 (ref 5.0–8.0)
Protein, ur: NEGATIVE mg/dL
Specific Gravity, Urine: 1.017 (ref 1.005–1.030)

## 2018-03-24 MED ORDER — OXYCODONE HCL 5 MG PO TABS: 5.0000 mg | ORAL_TABLET | Freq: Four times a day (QID) | ORAL | 0 refills | Status: DC | PRN

## 2018-03-24 MED ORDER — ASPIRIN 325 MG PO TBEC: 325.0000 mg | DELAYED_RELEASE_TABLET | Freq: Two times a day (BID) | ORAL | 0 refills | Status: DC

## 2018-03-24 MED ORDER — METHOCARBAMOL 500 MG PO TABS: 500.0000 mg | ORAL_TABLET | Freq: Four times a day (QID) | ORAL | 0 refills | Status: DC | PRN

## 2018-03-24 NOTE — NC FL2 (Signed)
[place] MEDICAID FL2 LEVEL OF CARE SCREENING TOOL     IDENTIFICATION  Patient Name: Lisa Torres Birthdate: 01/12/1946 Sex: female Admission Date (Current Location): 03/23/2018  [facility] and [place] Number:  Herbalist and Address:  [facility],  [place] [address] [place]      Provider Number: [id number]  Attending Physician Name and Address:  Vickey Huger, MD  Relative Name and Phone Number:       Current Level of Care: SNF Recommended Level of Care: [place] Prior Approval Number:    Date Approved/Denied:   PASRR Number: [id number]  Discharge Plan: SNF    Current Diagnoses: Patient Active Problem List   Diagnosis Date Noted  . S/P total knee replacement 03/23/2018  . Medication monitoring encounter 02/05/2018  . PICC (peripherally inserted central catheter) in place 02/05/2018  . Prosthetic joint infection ([place]) 02/04/2018  . 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 Abdominal pain, epigastric 11/04/2017  . Breast cancer, left breast ([place]) 11/02/2015  . Difficulty walking 10/20/2013  . Parkinson disease ([place]) 08/24/2013  . Hypothyroid 05/05/2013  . Hypertension 05/05/2013    Orientation RESPIRATION BLADDER Height & Weight     Self, Time, Situation, Place  Normal Continent Weight: 140 lb (63.5 kg) Height:     BEHAVIORAL SYMPTOMS/MOOD NEUROLOGICAL BOWEL NUTRITION STATUS      Continent Diet(Regular )  AMBULATORY STATUS COMMUNICATION OF NEEDS Skin   Extensive Assist Verbally Surgical wounds    Knee                       Personal Care Assistance Level of Assistance  Bathing, Feeding, Dressing Bathing Assistance: Maximum assistance Feeding assistance: Independent Dressing Assistance: Maximum assistance     Functional Limitations Info  Sight, Hearing, Speech Sight Info: Adequate Hearing Info: Adequate Speech Info: Adequate    SPECIAL CARE FACTORS FREQUENCY  PT (By licensed PT), OT (By  licensed OT)     PT Frequency: 5x/week OT Frequency: 5x/week             Contractures Contractures Info: Not present    Additional Factors Info  Code Status, Allergies, Psychotropic Code Status Info: Fullcode Allergies Info: Allergies: Hydrocodone, Tramadol Psychotropic Info: gabapentin          Current Medications (03/24/2018):  This is the current hospital active medication list Current Facility-Administered Medications  Medication Dose Route Frequency Provider Last Rate Last Dose  . 0.9 %  sodium chloride infusion   Intravenous Continuous Donia Ast, [place] 75 mL/hr at 03/24/18 0600    . acetaminophen (TYLENOL) tablet 1,000 mg  1,000 mg Oral TID PRN Donia Ast, PA      . acetaminophen (TYLENOL) tablet 1,000 mg  1,000 mg Oral Q6H Donia Ast, [place]   1,000 mg at 03/24/18 9390  . alum & mag hydroxide-simeth (MAALOX/MYLANTA) 200-200-20 MG/5ML suspension 30 mL  30 mL Oral Q4H PRN Donia Ast, [place]      . amLODipine (NORVASC) tablet 5 mg  5 mg Oral q morning - 10a Donia Ast, [place]      . aspirin EC tablet 325 mg  325 mg Oral BID Donia Ast, [place]      . bisacodyl (DULCOLAX) EC tablet 5 mg  5 mg Oral Daily PRN Donia Ast, PA      . dexamethasone (DECADRON) injection 10 mg  10 mg Intravenous Once Donia Ast, [place]      .  diphenhydrAMINE (BENADRYL) 12.5 MG/5ML elixir 12.5-25 mg  12.5-25 mg Oral Q4H PRN Donia Ast, [place]      . docusate sodium (COLACE) capsule 100 mg  100 mg Oral BID Donia Ast, [place]   100 mg at 03/23/18 2136  . ferrous sulfate tablet 325 mg  325 mg Oral TID PC Robbins, Enedina Finner, [place]      . gabapentin (NEURONTIN) capsule 300 mg  300 mg Oral TID Donia Ast, [place]   300 mg at 03/23/18 2136  . HYDROmorphone (DILAUDID) injection 0.5-1 mg  0.5-1 mg Intravenous Q4H PRN Donia Ast, [place]      . levothyroxine (SYNTHROID, LEVOTHROID) tablet 100 mcg  100 mcg Oral QAC breakfast Donia Ast, [place]    100 mcg at 03/24/18 9030  . menthol-cetylpyridinium (CEPACOL) lozenge 3 mg  1 lozenge Oral PRN Donia Ast, PA       Or  . phenol [facility]) mouth spray 1 spray  1 spray Mouth/Throat PRN Donia Ast, [place]      . methocarbamol (ROBAXIN) tablet 500 mg  500 mg Oral Q6H PRN Donia Ast, [place]   500 mg at 03/24/18 0013   Or  . methocarbamol (ROBAXIN) 500 mg in dextrose 5 % 50 mL IVPB  500 mg Intravenous Q6H PRN Donia Ast, [place]   Stopped at 03/23/18 1620  . metoCLOPramide (REGLAN) tablet 5-10 mg  5-10 mg Oral Q8H PRN Donia Ast, [place]       Or  . metoCLOPramide (REGLAN) injection 5-10 mg  5-10 mg Intravenous Q8H PRN Donia Ast, [place]      . ondansetron [facility]) tablet 4 mg  4 mg Oral Q6H PRN Donia Ast, [place]       Or  . ondansetron [facility]) injection 4 mg  4 mg Intravenous Q6H PRN Donia Ast, [place]      . oxyCODONE (Oxy IR/ROXICODONE) immediate release tablet 5-10 mg  5-10 mg Oral Q4H PRN Donia Ast, [place]   5 mg at 03/23/18 2136  . pantoprazole (PROTONIX) EC tablet 40 mg  40 mg Oral QAC breakfast Donia Ast, [place]   40 mg at 03/24/18 0923  . polyethylene glycol (MIRALAX / GLYCOLAX) packet 17 g  17 g Oral Daily PRN Donia Ast, PA      . senna-docusate (Senokot-S) tablet 1 tablet  1 tablet Oral QHS PRN Donia Ast, PA      . sodium phosphate (FLEET) 7-19 GM/118ML enema 1 enema  1 enema Rectal Once PRN Donia Ast, PA      . traZODone (DESYREL) tablet 50 mg  50 mg Oral QHS PRN Donia Ast, PA      . zolpidem [facility]) tablet 5 mg  5 mg Oral QHS PRN Donia Ast, [place]         Discharge Medications: Please see discharge summary for a list of discharge medications.  Relevant Imaging Results:  Relevant Lab Results:   Additional Information SSN [id number]  Lia Hopping, LCSW

## 2018-03-24 NOTE — Clinical Social Work Note (Signed)
Clinical Social Work Assessment  Patient Details  Name: Lisa Torres MRN: [id number] Date of Birth: [birth year]  Date of referral:  03/24/18               Reason for consult:  Facility Placement, Discharge Planning                Permission sought to share information with:  Facility Sport and exercise psychologist, Family Supports Permission granted to share information::  Yes, Verbal Permission Granted  Name::       Orthoptist::  SNF   Relationship::  Spouse   Contact Information:    [phone]  Housing/Transportation Living arrangements for the past 2 months:  Single Family Home Source of Information:  Patient Patient Interpreter Needed:  None Criminal Activity/Legal Involvement Pertinent to Current Situation/Hospitalization:  No - Comment as needed Significant Relationships:  Adult Children, Spouse Lives with:  Spouse Do you feel safe going back to the place where you live?  Yes Need for family participation in patient care:  Yes  Care giving concerns:   SNF placement for short rehab.  Patient admitted for painful right knee/surgical intervention.    Social Worker assessment / plan:  CSW discussed discharge plan to SNF for short rehab. Patient knowledgeable of plan and SNF process to discharge to [facility]. Patient reports she recently had a procedure and went to local SNF for short rehab and then discharged home.  CSW completed FL2, and checked for PASRR.  CSW reached out to SNF to initiate  the authorization process.   Plan: SNF   Employment status:  Retired Nurse, adult PT Recommendations:  [place] / Referral to community resources:  [place]  Patient/Family's Response to care:  Agreeable and Responding well to care.   Patient/Family's Understanding of and Emotional Response to Diagnosis, Current Treatment, and Prognosis:  Patient has a good understanding of diagnosis and  follow up care.   Emotional Assessment Appearance:  Appears stated age Attitude/Demeanor/Rapport:    Affect (typically observed):  Accepting, Pleasant Orientation:  Oriented to Self, Oriented to Place, Oriented to  Time, Oriented to Situation Alcohol / Substance use:  Not Applicable Psych involvement (Current and /or in the community):  No (Comment)  Discharge Needs  Concerns to be addressed:  Discharge Planning Concerns Readmission within the last 30 days:  No Current discharge risk:  Dependent with Mobility, Physical Impairment Barriers to Discharge:  Continued Medical Work up, [place], LCSW 03/24/2018, 11:57 AM

## 2018-03-24 NOTE — Care Management Note (Signed)
Case Management Note  Patient Details  Name: Lisa Torres MRN: [id number] Date of Birth: [birth year]  Subjective/Objective:   Plan for d/c to SNF, discharge planning per CSW. [phone]                 Action/Plan:   Expected Discharge Date:  03/25/18               Expected Discharge Plan:  Skilled Nursing Facility  In-House Referral:  Clinical Social Work  Discharge planning Services  CM Consult  Post Acute Care Choice:  NA Choice offered to:  Patient  DME Arranged:  N/A DME Agency:  NA  HH Arranged:  NA HH Agency:  NA  Status of Service:  Completed, signed off  If discussed at [employer] of Stay Meetings, dates discussed:    Additional Comments:  Guadalupe Maple, RN 03/24/2018, 8:40 AM

## 2018-03-24 NOTE — Progress Notes (Signed)
SPORTS MEDICINE AND JOINT REPLACEMENT  Lara Mulch, MD    Carlyon Shadow, PA-C [place], [place], [place]  [address]                             [phone]   PROGRESS NOTE  Subjective:  negative for Chest Pain  negative for Shortness of Breath  negative for Nausea/Vomiting   negative for Calf Pain  negative for Bowel Movement   Tolerating Diet: yes         Patient reports pain as 3 on 0-10 scale.    Objective: Vital signs in last 24 hours:    Patient Vitals for the past 24 hrs:  BP Temp Temp src Pulse Resp SpO2 Weight  03/24/18 0512 140/72 (!) 97.4 F (36.3 C) Oral - - - -  03/24/18 0120 (!) 142/67 (!) 97.4 F (36.3 C) Oral 61 - 100 % -  03/23/18 2124 120/66 (!) 97.4 F (36.3 C) Oral 68 15 100 % -  03/23/18 1941 136/77 (!) 97.4 F (36.3 C) Oral 80 - 100 % -  03/23/18 1838 135/65 - - 67 14 100 % -  03/23/18 1732 (!) 144/70 97.8 F (36.6 C) Oral (!) 54 16 100 % -  03/23/18 1643 (!) 155/77 (!) 97.5 F (36.4 C) Oral 61 14 100 % -  03/23/18 1600 (!) 145/71 98 F (36.7 C) - (!) 57 15 99 % -  03/23/18 1545 (!) 163/77 - - 63 11 100 % -  03/23/18 1530 (!) 170/69 98 F (36.7 C) - (!) 57 15 100 % -  03/23/18 1515 - - - - 15 - -  03/23/18 1500 (!) 156/69 - - 61 15 (!) 89 % -  03/23/18 1449 - - - - 15 - -  03/23/18 1445 (!) 120/59 97.8 F (36.6 C) - 60 16 100 % -  03/23/18 1205 - - - 64 19 100 % -  03/23/18 1204 - - - 65 17 100 % -  03/23/18 1203 - - - 60 14 100 % -  03/23/18 1202 (!) 153/66 - - (!) 59 11 100 % -  03/23/18 1201 - - - 60 12 100 % -  03/23/18 1200 - - - 65 18 100 % -  03/23/18 1159 - - - 61 14 100 % -  03/23/18 1158 - - - 64 16 100 % -  03/23/18 1157 (!) 170/74 - - 65 17 100 % -  03/23/18 1156 - - - 65 14 100 % -  03/23/18 1155 - - - 67 (!) 21 100 % -  03/23/18 1154 - - - - 15 - -  03/23/18 1035 (!) 182/70 98.3 F (36.8 C) Oral 67 18 100 % 63.5 kg    @flow {1959:LAST@   Intake/Output from previous day:   10/07 0701 - 10/08 0700 In:  3618 [P.O.:480; I.V.:2838] Out: 2450 [Urine:2300]   Intake/Output this shift:   No intake/output data recorded.   Intake/Output      10/07 0701 - 10/08 0700 10/08 0701 - 10/09 0700   P.O. 480    I.V. (mL/kg) 2838 (44.7)    IV Piggyback 300    Total Intake(mL/kg) 3618 (57)    Urine (mL/kg/hr) 2300    Blood 150    Total Output 2450    Net +1168            LABORATORY DATA: Recent Labs    03/23/18  1101  WBC 3.1*  HGB 12.8  HCT 39.9  PLT 275   Recent Labs    03/23/18 1101 03/24/18 0510  NA 142 139  K 3.9 4.4  CL 107 106  CO2 30 24  BUN 10 10  CREATININE 0.65 0.60  GLUCOSE 92 105*  CALCIUM 9.7 9.0   No results found for: INR, PROTIME  Examination:  General appearance: alert, cooperative and no distress Extremities: extremities normal, atraumatic, no cyanosis or edema  Wound Exam: clean, dry, intact   Drainage:  None: wound tissue dry  Motor Exam: Quadriceps and Hamstrings Intact  Sensory Exam: Superficial Peroneal, Deep Peroneal and Tibial normal   Assessment:    1 Day Post-Op  Procedure(s) (LRB): RIGHT TOTAL KNEE REVISION (Right)  ADDITIONAL DIAGNOSIS:  Active Problems:   S/P total knee replacement     Plan: Physical Therapy as ordered Weight Bearing as Tolerated (WBAT)  DVT Prophylaxis:  Aspirin  DISCHARGE PLAN: Home  DISCHARGE NEEDS: HHPT  Patient looks well, will continue to follow. Expect D/C to SNF tomorrow     Anticipated LOS equal to or greater than 2 midnights due to - Age 14 and older with one or more of the following:  - Obesity  - Expected need for hospital services (PT, OT, Nursing) required for safe  discharge  - Anticipated need for postoperative skilled nursing care or inpatient rehab       Donia Ast 03/24/2018, 7:07 AM

## 2018-03-24 NOTE — Op Note (Signed)
TOTAL KNEE REPLACEMENT OPERATIVE NOTE:  03/23/2018  8:47 PM  PATIENT:  Lisa Torres  72 y.o. female  PRE-OPERATIVE DIAGNOSIS:  Failed Total Knee Replacement  T84.019A  POST-OPERATIVE DIAGNOSIS:  failed total knee replacement  PROCEDURE:  Procedure(s): RIGHT TOTAL KNEE REVISION  SURGEON:  Surgeon(s): Vickey Huger, MD  PHYSICIAN ASSISTANT: Carlyon Shadow, PA-C   ANESTHESIA:   spinal  SPECIMEN: None  COUNTS:  Correct  TOURNIQUET:   Total Tourniquet Time Documented: Thigh (Right) - 80 minutes Total: Thigh (Right) - 80 minutes   DICTATION:  Indication for procedure:    The patient is a 72 y.o. female who has failed conservative treatment for Failed Total Knee Replacement  T84.019A.  Informed consent was obtained prior to anesthesia. The risks versus benefits of the operation were explain and in a way the patient can, and did, understand.   On the implant demand matching protocol, this patient scored 6.  Therefore, this patient was not receive a polyethylene insert with vitamin E which is a high demand implant.  Description of procedure:     The patient was taken to the operating room and placed under anesthesia.  The patient was positioned in the usual fashion taking care that all body parts were adequately padded and/or protected.  A tourniquet was applied and the leg prepped and draped in the usual sterile fashion.  The extremity was exsanguinated with the esmarch and tourniquet inflated to 350 mmHg.  Pre-operative range of motion was normal.  The knee was in 10 degree of significant varus.  A midline incision approximately 6-7 inches long was made with a #10 blade.  A new blade was used to make a parapatellar arthrotomy going 2-3 cm into the quadriceps tendon, over the patella, and alongside the medial aspect of the patellar tendon.  A synovectomy was then performed with the #10 blade and forceps. I then elevated the deep MCL off the medial tibial metaphysis  subperiosteally around to the semimembranosus attachment.    I everted the patella and used calipers to measure patellar thickness.  I used the reamer to ream down to appropriate thickness to recreate the native thickness.  I then removed excess bone with the rongeur and sagittal saw.  I used the appropriately sized template and drilled the three lug holes.  I then put the trial in place and measured the thickness with the calipers to ensure recreation of the native thickness.  The trial was then removed and the patella subluxed and the knee brought into flexion.  A homan retractor was place to retract and protect the patella and lateral structures.  A Z-retractor was place medially to protect the medial structures.  The extra-medullary alignment system was used to make cut the tibial articular surface perpendicular to the anamotic axis of the tibia and in 3 degrees of posterior slope.  The cut surface and alignment jig was removed.  I then used the intramedullary alignment guide to make a 6 valgus cut on the distal femur.  I then removed the antibiotic spacer and removed all scar tissue and necrotic bone.  I used The 4-In-1 cutting block was screwed into place in external rotation matching the posterior condylar angle, making our cuts perpendicular to the epicondylar axis.  Anterior, posterior and chamfer cuts were made with the sagittal saw.  The cutting block and cut pieces were removed. A 68mm stem was used and this recreated the femur well with a D femur.  The tibia was delivered forward in deep  flexion and external rotation. I reamed to a 48mm stem and recut the tibia to a size 2 tibia.  This recreated the tibia well.  The knee was balanced with a size 51mm CCK tibial insert.  These components were chosen and opened and assembled.  The knee was lavaged copiously and the cement mixed.  The soft tissue was infiltrated with 60cc of exparel 1.3% through a 21 gauge needle.  I cemented in the  components and removed all excess cement.  The polyethylene tibial component was snapped into place and the knee placed in extension while cement was hardening.  The capsule was infilltrated with a 60cc exparel/marcaine/saline mixture.   Once the cement was hard, the tourniquet was let down.  Hemostasis was obtained.  The arthrotomy was closed using a #1 stratofix running suture.  The deep soft tissues were closed with #0 vicryls and the subcuticular layer closed with #2-0 vicryl.  The skin was reapproximated and closed with 3.0 Monocryl.  The wound was covered with steristrips, aquacel dressing, and a TED stocking.   The patient was then awakened, extubated, and taken to the recovery room in stable condition.  BLOOD LOSS:  191YN COMPLICATIONS:  None.  PLAN OF CARE: Admit to inpatient   PATIENT DISPOSITION:  PACU - hemodynamically stable.   Delay start of Pharmacological VTE agent (>24hrs) due to surgical blood loss or risk of bleeding:  not applicable  Please fax a copy of this op note to my office at [phone] (please only include page 1 and 2 of the Case Information op note)

## 2018-03-24 NOTE — Progress Notes (Signed)
Physical Therapy Treatment Patient Details Name: Lisa Torres MRN: [id number] DOB: 25-Jul-1945 Today's Date: 03/24/2018    History of Present Illness 72 YO female s/p R TKR revision, R TKR and resection/spacer previously on 01/16/18. Pt with significant PMH of Parkinson's disease, HTN, DDD, chronic leukopenia, chronic leg and back pain, breast CA, several R knee surgeries.    PT Comments    Pt with improved transfer ability with use of transfer board. Pt also requiring less physical assist with this method. Pt with addition of long leg cast on RLE, requires close guarding with mobility due to the weight. PT to progress mobility as able, will continue to follow acutely.    Follow Up Recommendations  SNF     Equipment Recommendations   None recommended by PT    Recommendations for Other Services       Precautions / Restrictions Precautions Precautions: Fall;Knee Restrictions Weight Bearing Restrictions: No Other Position/Activity Restrictions: WBAT     Mobility  Bed Mobility Overal bed mobility: Needs Assistance Bed Mobility: Supine to Sit     Supine to sit: Mod assist;+2 for physical assistance     General bed mobility comments: Mod assist +2 for trunk elevation and LE management. Pt in long sitting at first to place transfer board. Mod-max assist for scooting to EOB and lowering legs to prepare for transfer board transfer to drop arm recliner.   Transfers Overall transfer level: Needs assistance Equipment used: Sliding board Transfers: Lateral/Scoot Transfers          Lateral/Scoot Transfers: Mod assist;+2 safety/equipment;With slide board General transfer comment: Mod assist for LE management, placement of transfer board, scooting on transfer board, and placement in recliner. Pt with tendency to slide forward of sliding board, board repositioned and close LE guarding. Pt facilitated scooting from ischial tuberosities and with bed pad.  Ambulation/Gait                  Stairs             Wheelchair Mobility    Modified Rankin (Stroke Patients Only)       Balance Overall balance assessment: Needs assistance Sitting-balance support: Bilateral upper extremity supported;Feet supported Sitting balance-Leahy Scale: Fair Sitting balance - Comments: able to sit unsupported for a few seconds, will not tolerate challenge                                     Cognition Arousal/Alertness: Awake/alert Behavior During Therapy: WFL for tasks assessed/performed Overall Cognitive Status: No family/caregiver present to determine baseline cognitive functioning                                        Exercises      General Comments        Pertinent Vitals/Pain Pain Assessment: No/denies pain    Home Living                      Prior Function            PT Goals (current goals can now be found in the care plan section) Acute Rehab PT Goals Patient Stated Goal: walk household distances  PT Goal Formulation: With patient Time For Goal Achievement: 04/06/18 Potential to Achieve Goals: Fair Progress towards PT goals: Progressing toward goals  Frequency    Min 5X/week      PT Plan Current plan remains appropriate    Co-evaluation              AM-PAC PT "6 Clicks" Daily Activity  Outcome Measure  Difficulty turning over in bed (including adjusting bedclothes, sheets and blankets)?: Unable Difficulty moving from lying on back to sitting on the side of the bed? : Unable Difficulty sitting down on and standing up from a chair with arms (e.g., wheelchair, bedside commode, etc,.)?: Unable Help needed moving to and from a bed to chair (including a wheelchair)?: Total Help needed walking in hospital room?: Total Help needed climbing 3-5 steps with a railing? : Total 6 Click Score: 6    End of Session Equipment Utilized During Treatment: Right knee immobilizer;Gait belt(cast  ) Activity Tolerance: Patient tolerated treatment well Patient left: with call bell/phone within reach;in chair;with chair alarm set;with SCD's reapplied Nurse Communication: Mobility status PT Visit Diagnosis: Unsteadiness on feet (R26.81)     Time: 5638-9373 PT Time Calculation (min) (ACUTE ONLY): 12 min  Charges:  $Therapeutic Activity: 8-22 mins                     Ltanya Bayley Conception Chancy, PT Acute Rehabilitation Services Pager [phone]  Office [phone]    Corvette Orser D Aviah Sorci 03/24/2018, 7:17 PM

## 2018-03-24 NOTE — Progress Notes (Signed)
Physical Therapy Treatment Patient Details Name: Lisa Torres MRN: [id number] DOB: 03/16/1946 Today's Date: 03/24/2018    History of Present Illness 72 YO female s/p R TKR revision, R TKR and resection/spacer previously on 01/16/18. Pt with significant PMH of Parkinson's disease, HTN, DDD, chronic leukopenia, chronic leg and back pain, breast CA, several R knee surgeries.    PT Comments    The patient participated in bed mobility and sitting at bed edge. Plans are to place cast on the right knee in near future so did not  Assist to recliner. Continue PT.   Follow Up Recommendations  SNF     Equipment Recommendations    none   Recommendations for Other Services       Precautions / Restrictions Precautions Precautions: Fall;Knee Restrictions Weight Bearing Restrictions: No Other Position/Activity Restrictions: WBAT     Mobility  Bed Mobility   Bed Mobility: Supine to Sit;Sit to Supine     Supine to sit: Min assist Sit to supine: Mod assist   General bed mobility comments: Min assist for scooting to EOB. Very increased time and effort to get to EOB. Patient able lift the  legs back onto bed.  Transfers                 General transfer comment: NT- to get cast at 1200  Ambulation/Gait                 Stairs             Wheelchair Mobility    Modified Rankin (Stroke Patients Only)       Balance Overall balance assessment: Needs assistance Sitting-balance support: Bilateral upper extremity supported;Feet supported Sitting balance-Leahy Scale: Fair Sitting balance - Comments: able to sit at midline x 5 minutes, performs exercises and maintains balance                                    Cognition Arousal/Alertness: Awake/alert   Overall Cognitive Status: No family/caregiver present to determine baseline cognitive functioning                                        Exercises Total Joint  Exercises Heel Slides: AAROM;Right;Left;10 reps;Supine Straight Leg Raises: AAROM;Both;10 reps;Supine Long Arc Quad: AROM;Left;10 reps;Seated Goniometric ROM: lacking 20 degrees knee extension both knees    General Comments        Pertinent Vitals/Pain Pain Assessment: No/denies pain    Home Living                      Prior Function            PT Goals (current goals can now be found in the care plan section) Progress towards PT goals: Progressing toward goals    Frequency    Min 5X/week      PT Plan Current plan remains appropriate;Frequency needs to be updated    Co-evaluation              AM-PAC PT "6 Clicks" Daily Activity  Outcome Measure  Difficulty turning over in bed (including adjusting bedclothes, sheets and blankets)?: A Lot Difficulty moving from lying on back to sitting on the side of the bed? : A Lot Difficulty sitting down on and standing up from a chair with arms (  e.g., wheelchair, bedside commode, etc,.)?: Unable Help needed moving to and from a bed to chair (including a wheelchair)?: Total Help needed walking in hospital room?: Total Help needed climbing 3-5 steps with a railing? : Total 6 Click Score: 8    End of Session Equipment Utilized During Treatment: Right knee immobilizer Activity Tolerance: Patient tolerated treatment well Patient left: in bed;with call bell/phone within reach;with family/visitor present;with bed alarm set Nurse Communication: Mobility status PT Visit Diagnosis: Unsteadiness on feet (R26.81)     Time: 7841-2820 PT Time Calculation (min) (ACUTE ONLY): 20 min  Charges:  $Therapeutic Activity: 8-22 mins                      Tresa Endo PT Acute Rehabilitation Services Pager [phone] Office [phone]    Lisa Torres 03/24/2018, 12:57 PM

## 2018-03-25 ENCOUNTER — Inpatient Hospital Stay
Admission: RE | Admit: 2018-03-25 | Discharge: 2018-04-16 | Disposition: A | Discharge status: Home / Self Care | Payer: Medicare Other | Source: Ambulatory Visit | Attending: Internal Medicine | Admitting: Internal Medicine

## 2018-03-25 LAB — CBC
HEMATOCRIT: 30.4 % — AB (ref 36.0–46.0)
Hemoglobin: 9.3 g/dL — ABNORMAL LOW (ref 12.0–15.0)
MCH: 27 pg (ref 26.0–34.0)
MCHC: 30.6 g/dL (ref 30.0–36.0)
MCV: 88.4 fL (ref 80.0–100.0)
Platelets: 198 10*3/uL (ref 150–400)
RBC: 3.44 MIL/uL — ABNORMAL LOW (ref 3.87–5.11)
RDW: 13.2 % (ref 11.5–15.5)
WBC: 6.6 10*3/uL (ref 4.0–10.5)
nRBC: 0 % (ref 0.0–0.2)

## 2018-03-25 NOTE — Progress Notes (Signed)
At 1420, I called [facility] and gave report to a nurse named Vicky. Upon completion of report, Vicky reported no further questions.

## 2018-03-25 NOTE — Discharge Summary (Signed)
SPORTS MEDICINE & JOINT REPLACEMENT   Lara Mulch, MD   Carlyon Shadow, PA-C [address], [place], [place]  [address]                             [phone]  PATIENT ID: Lisa Torres        MRN:  [id number]          DOB/AGE: 72-Jun-1947 / 72 y.o.    DISCHARGE SUMMARY  ADMISSION DATE:    03/23/2018 DISCHARGE DATE:   03/25/2018   ADMISSION DIAGNOSIS: Failed Total Knee Replacement  T84.019A    DISCHARGE DIAGNOSIS:  Failed Total Knee Replacement  T84.019A    ADDITIONAL DIAGNOSIS: Active Problems:   S/P total knee replacement  Past Medical History:  Diagnosis Date  . Breast cancer ([place]) 11/2011  . Chronic back pain   . Chronic leg pain    bilateral  . Chronic leukopenia   . DDD (degenerative disc disease), cervical   . DDD (degenerative disc disease), lumbar   . Gait abnormality   . GERD (gastroesophageal reflux disease)   . Hypertension   . Parkinson's disease ([place])   . Radiation 04/2012   33 treatments for breast cancer  . Right knee pain   . Thyroid disease     PROCEDURE: Procedure(s): RIGHT TOTAL KNEE REVISION on 03/23/2018  CONSULTS:    HISTORY:  See H&P in chart  HOSPITAL COURSE:  Lisa Torres is a 72 y.o. admitted on 03/23/2018 and found to have a diagnosis of Failed Total Knee Replacement  T84.019A.  After appropriate laboratory studies were obtained  they were taken to the operating room on 03/23/2018 and underwent Procedure(s): RIGHT TOTAL KNEE REVISION.   They were given perioperative antibiotics:  Anti-infectives (From admission, onward)   Start     Dose/Rate Route Frequency Ordered Stop   03/23/18 1830  ceFAZolin (ANCEF) IVPB 2g/100 mL premix     2 g 200 mL/hr over 30 Minutes Intravenous Every 6 hours 03/23/18 1639 03/24/18 0046   03/23/18 1045  ceFAZolin (ANCEF) IVPB 2g/100 mL premix     2 g 200 mL/hr over 30 Minutes Intravenous On call to O.R. 03/23/18 1031 03/23/18 1221    .  Patient given tranexamic acid IV or topical and  exparel intra-operatively.  Tolerated the procedure well.    POD# 1: Vital signs were stable.  Patient denied Chest pain, shortness of breath, or calf pain.  Patient was started on Aspirin twice daily at 8am.  Consults to PT, OT, and care management were made.  The patient was weight bearing as tolerated.  CPM was placed on the operative leg 0-90 degrees for 6-8 hours a day. When out of the CPM, patient was placed in the foam block to achieve full extension. Incentive spirometry was taught.  Dressing was changed.       POD #2, Continued  PT for ambulation and exercise program.  IV saline locked.  O2 discontinued.    The remainder of the hospital course was dedicated to ambulation and strengthening.   The patient was discharged on 2 Days Post-Op in  Good condition.  Blood products given:none  DIAGNOSTIC STUDIES: Recent vital signs:  Patient Vitals for the past 24 hrs:  BP Temp Temp src Pulse Resp SpO2  03/25/18 1306 (!) 147/68 98.4 F (36.9 C) Oral 80 15 100 %  03/25/18 0804 (!) 167/79 - - 69 - -  03/24/18 2250 Marland Kitchen)  160/55 (!) 97.5 F (36.4 C) Oral 63 14 100 %  03/24/18 1404 (!) 158/75 97.7 F (36.5 C) Oral 92 14 100 %       Recent laboratory studies: Recent Labs    03/23/18 1101 03/24/18 0608 03/25/18 0501  WBC 3.1* 8.4 6.6  HGB 12.8 10.7* 9.3*  HCT 39.9 32.3* 30.4*  PLT 275 206 198   Recent Labs    03/23/18 1101 03/24/18 0510  NA 142 139  K 3.9 4.4  CL 107 106  CO2 30 24  BUN 10 10  CREATININE 0.65 0.60  GLUCOSE 92 105*  CALCIUM 9.7 9.0   No results found for: INR, PROTIME   Recent Radiographic Studies :  No results found.  DISCHARGE INSTRUCTIONS: Discharge Instructions    Call MD / Call 911   Complete by:  As directed    If you experience chest pain or shortness of breath, CALL 911 and be transported to the hospital emergency room.  If you develope a fever above 101 F, pus (white drainage) or increased drainage or redness at the wound, or calf pain, call  your surgeon's office.   Constipation Prevention   Complete by:  As directed    Drink plenty of fluids.  Prune juice may be helpful.  You may use a stool softener, such as Colace (over the counter) 100 mg twice a day.  Use MiraLax (over the counter) for constipation as needed.   Diet - low sodium heart healthy   Complete by:  As directed    Discharge instructions   Complete by:  As directed    INSTRUCTIONS AFTER JOINT REPLACEMENT   Remove items at home which could result in a fall. This includes throw rugs or furniture in walking pathways ICE to the affected joint every three hours while awake for 30 minutes at a time, for at least the first 3-5 days, and then as needed for pain and swelling.  Continue to use ice for pain and swelling. You may notice swelling that will progress down to the foot and ankle.  This is normal after surgery.  Elevate your leg when you are not up walking on it.   Continue to use the breathing machine you got in the hospital (incentive spirometer) which will help keep your temperature down.  It is common for your temperature to cycle up and down following surgery, especially at night when you are not up moving around and exerting yourself.  The breathing machine keeps your lungs expanded and your temperature down.   DIET:  As you were doing prior to hospitalization, we recommend a well-balanced diet.  DRESSING / WOUND CARE / SHOWERING  Keep the surgical dressing until follow up.  The dressing is water proof, so you can shower without any extra covering.  IF THE DRESSING FALLS OFF or the wound gets wet inside, change the dressing with sterile gauze.  Please use good hand washing techniques before changing the dressing.  Do not use any lotions or creams on the incision until instructed by your surgeon.    ACTIVITY  Increase activity slowly as tolerated, but follow the weight bearing instructions below.   No driving for 6 weeks or until further direction given by your  physician.  You cannot drive while taking narcotics.  No lifting or carrying greater than 10 lbs. until further directed by your surgeon. Avoid periods of inactivity such as sitting longer than an hour when not asleep. This helps prevent blood clots.  You may return to work once you are authorized by your doctor.     WEIGHT BEARING   Weight bearing as tolerated with assist device (walker, cane, etc) as directed, use it as long as suggested by your surgeon or therapist, typically at least 4-6 weeks.   EXERCISES  Results after joint replacement surgery are often greatly improved when you follow the exercise, range of motion and muscle strengthening exercises prescribed by your doctor. Safety measures are also important to protect the joint from further injury. Any time any of these exercises cause you to have increased pain or swelling, decrease what you are doing until you are comfortable again and then slowly increase them. If you have problems or questions, call your caregiver or physical therapist for advice.   Rehabilitation is important following a joint replacement. After just a few days of immobilization, the muscles of the leg can become weakened and shrink (atrophy).  These exercises are designed to build up the tone and strength of the thigh and leg muscles and to improve motion. Often times heat used for twenty to thirty minutes before working out will loosen up your tissues and help with improving the range of motion but do not use heat for the first two weeks following surgery (sometimes heat can increase post-operative swelling).   These exercises can be done on a training (exercise) mat, on the floor, on a table or on a bed. Use whatever works the best and is most comfortable for you.    Use music or television while you are exercising so that the exercises are a pleasant break in your day. This will make your life better with the exercises acting as a break in your routine that you  can look forward to.   Perform all exercises about fifteen times, three times per day or as directed.  You should exercise both the operative leg and the other leg as well.   Exercises include:   Quad Sets - Tighten up the muscle on the front of the thigh (Quad) and hold for 5-10 seconds.   Straight Leg Raises - With your knee straight (if you were given a brace, keep it on), lift the leg to 60 degrees, hold for 3 seconds, and slowly lower the leg.  Perform this exercise against resistance later as your leg gets stronger.  Leg Slides: Lying on your back, slowly slide your foot toward your buttocks, bending your knee up off the floor (only go as far as is comfortable). Then slowly slide your foot back down until your leg is flat on the floor again.  Angel Wings: Lying on your back spread your legs to the side as far apart as you can without causing discomfort.  Hamstring Strength:  Lying on your back, push your heel against the floor with your leg straight by tightening up the muscles of your buttocks.  Repeat, but this time bend your knee to a comfortable angle, and push your heel against the floor.  You may put a pillow under the heel to make it more comfortable if necessary.   A rehabilitation program following joint replacement surgery can speed recovery and prevent re-injury in the future due to weakened muscles. Contact your doctor or a physical therapist for more information on knee rehabilitation.    CONSTIPATION  Constipation is defined medically as fewer than three stools per week and severe constipation as less than one stool per week.  Even if you have a regular bowel pattern at  home, your normal regimen is likely to be disrupted due to multiple reasons following surgery.  Combination of anesthesia, postoperative narcotics, change in appetite and fluid intake all can affect your bowels.   YOU MUST use at least one of the following options; they are listed in order of increasing strength  to get the job done.  They are all available over the counter, and you may need to use some, POSSIBLY even all of these options:    Drink plenty of fluids (prune juice may be helpful) and high fiber foods Colace 100 mg by mouth twice a day  Senokot for constipation as directed and as needed Dulcolax (bisacodyl), take with full glass of water  Miralax (polyethylene glycol) once or twice a day as needed.  If you have tried all these things and are unable to have a bowel movement in the first 3-4 days after surgery call either your surgeon or your primary doctor.    If you experience loose stools or diarrhea, hold the medications until you stool forms back up.  If your symptoms do not get better within 1 week or if they get worse, check with your doctor.  If you experience "the worst abdominal pain ever" or develop nausea or vomiting, please contact the office immediately for further recommendations for treatment.   ITCHING:  If you experience itching with your medications, try taking only a single pain pill, or even half a pain pill at a time.  You can also use Benadryl over the counter for itching or also to help with sleep.   TED HOSE STOCKINGS:  Use stockings on both legs until for at least 2 weeks or as directed by physician office. They may be removed at night for sleeping.  MEDICATIONS:  See your medication summary on the "After Visit Summary" that nursing will review with you.  You may have some home medications which will be placed on hold until you complete the course of blood thinner medication.  It is important for you to complete the blood thinner medication as prescribed.  PRECAUTIONS:  If you experience chest pain or shortness of breath - call 911 immediately for transfer to the hospital emergency department.   If you develop a fever greater that 101 F, purulent drainage from wound, increased redness or drainage from wound, foul odor from the wound/dressing, or calf pain - CONTACT  YOUR SURGEON.                                                   FOLLOW-UP APPOINTMENTS:  If you do not already have a post-op appointment, please call the office for an appointment to be seen by your surgeon.  Guidelines for how soon to be seen are listed in your "After Visit Summary", but are typically between 1-4 weeks after surgery.  OTHER INSTRUCTIONS:   Knee Replacement:  Do not place pillow under knee, focus on keeping the knee straight while resting. CPM instructions: 0-90 degrees, 2 hours in the morning, 2 hours in the afternoon, and 2 hours in the evening. Place foam block, curve side up under heel at all times except when in CPM or when walking.  DO NOT modify, tear, cut, or change the foam block in any way.  MAKE SURE YOU:  Understand these instructions.  Get help right away if you are not  doing well or get worse.    Thank you for letting us be a part of your medical care team.  It is a privilege we respect greatly.  We hope these instructions will help you stay on track for a fast and full recovery!   Increase activity slowly as tolerated   Complete by:  As directed       DISCHARGE MEDICATIONS:   Allergies as of 03/25/2018      Reactions   Hydrocodone Other (See Comments)   Heart pain  Delusions   Tramadol Other (See Comments)   Hallucinations, abd pain Delusions (intolerance)      Medication List    TAKE these medications   acetaminophen 500 MG tablet Commonly known as:  TYLENOL Take 1,000 mg by mouth 3 (three) times daily as needed for moderate pain or headache.   amLODipine 5 MG tablet Commonly known as:  NORVASC Take 5 mg by mouth every morning.   aspirin 325 MG EC tablet Take 1 tablet (325 mg total) by mouth 2 (two) times daily.   levothyroxine 100 MCG tablet Commonly known as:  SYNTHROID, LEVOTHROID Take 100 mcg by mouth daily before breakfast.   Melatonin 3 MG Caps Take 3 mg by mouth at bedtime as needed (for sleep).   methocarbamol 500 MG  tablet Commonly known as:  ROBAXIN Take 1-2 tablets (500-1,000 mg total) by mouth every 6 (six) hours as needed for muscle spasms.   oxyCODONE 5 MG immediate release tablet Commonly known as:  Oxy IR/ROXICODONE Take 1-2 tablets (5-10 mg total) by mouth every 6 (six) hours as needed for moderate pain (pain score 4-6).   pantoprazole 40 MG tablet Commonly known as:  PROTONIX Take 1 tablet (40 mg total) by mouth daily before breakfast.   polyethylene glycol powder powder Commonly known as:  GLYCOLAX/MIRALAX Take 17 g by mouth daily as needed for mild constipation or moderate constipation.   traZODone 50 MG tablet Commonly known as:  DESYREL Take 50 mg by mouth at bedtime as needed for sleep.   VITAMIN B-12 PO Take 2.5 mLs by mouth daily.            Durable Medical Equipment  (From admission, onward)         Start     Ordered   03/23/18 1639  DME Walker rolling  Once    Question:  Patient needs a walker to treat with the following condition  Answer:  S/P total knee replacement   03/23/18 1639   03/23/18 1639  DME 3 n 1  Once     03/23/18 1639   03/23/18 1639  DME Bedside commode  Once    Question:  Patient needs a bedside commode to treat with the following condition  Answer:  S/P total knee replacement   03/23/18 1639          FOLLOW UP VISIT:   Contact information for after-discharge care    [facility] .   Service:  Skilled Nursing Contact information: [address] [place] [phone]              DISPOSITION: HOME VS. SNF  CONDITION:  Keenan Bachelor 03/25/2018, 1:15 PM

## 2018-03-25 NOTE — Clinical Social Work Placement (Signed)
D/C Summary sent.  [facility] to transport.   CLINICAL SOCIAL WORK PLACEMENT  NOTE  Date:  03/25/2018  Patient Details  Name: Lisa Torres MRN: [id number] Date of Birth: 04-15-1946  Clinical Social Work is seeking post-discharge placement for this patient at the [place] level of care (*CSW will initial, date and re-position this form in  chart as items are completed):  Yes   Patient/family provided with [facility] Work Department's list of facilities offering this level of care within the geographic area requested by the patient (or if unable, by the patient's family).  Yes   Patient/family informed of their freedom to choose among providers that offer the needed level of care, that participate in Medicare, Medicaid or managed care program needed by the patient, have an available bed and are willing to accept the patient.  Yes   Patient/family informed of [facility]'s ownership interest in [facility] and [facility], as well as of the fact that they are under no obligation to receive care at these facilities.  PASRR submitted to EDS on       PASRR number received on       Existing PASRR number confirmed on 03/24/18     FL2 transmitted to all facilities in geographic area requested by pt/family on       FL2 transmitted to all facilities within larger geographic area on 03/25/18     Patient informed that his/her managed care company has contracts with or will negotiate with certain facilities, including the following:  [facility]     Yes   Patient/family informed of bed offers received.  Patient chooses bed at [facility]     Physician recommends and patient chooses bed at      Patient to be transferred to [facility] on 03/25/18.  Patient to be transferred to facility by [facility]     Patient family notified on 03/25/18 of transfer.  Name of family member notified:  Patient to notify her spouse       PHYSICIAN       Additional Comment:    _______________________________________________ Lia Hopping, LCSW 03/25/2018, 1:33 PM

## 2018-03-25 NOTE — Progress Notes (Signed)
Insurance authorization for SNF placement received. Patient can transfer when medically stable.   Kathrin Greathouse, Marlinda Mike, MSW Clinical Social Worker  [phone] 03/25/2018  11:17 AM

## 2018-03-26 ENCOUNTER — Encounter: Payer: Self-pay | Admitting: Internal Medicine

## 2018-03-26 ENCOUNTER — Non-Acute Institutional Stay (SKILLED_NURSING_FACILITY): Payer: Medicare Other | Admitting: Internal Medicine

## 2018-03-26 DIAGNOSIS — K219 Gastro-esophageal reflux disease without esophagitis: Secondary | ICD-10-CM | POA: Diagnosis not present

## 2018-03-26 DIAGNOSIS — I10 Essential (primary) hypertension: Secondary | ICD-10-CM | POA: Diagnosis not present

## 2018-03-26 DIAGNOSIS — G4700 Insomnia, unspecified: Secondary | ICD-10-CM

## 2018-03-26 DIAGNOSIS — D649 Anemia, unspecified: Secondary | ICD-10-CM

## 2018-03-26 DIAGNOSIS — G8929 Other chronic pain: Secondary | ICD-10-CM

## 2018-03-26 DIAGNOSIS — Z96651 Presence of right artificial knee joint: Secondary | ICD-10-CM | POA: Diagnosis not present

## 2018-03-26 NOTE — Progress Notes (Signed)
Provider:  Veleta Miners MD Location:    [facility] Room Number: 143/P Place of Service:  SNF (31)  PCP: [name], MD Patient Care Team: [name], MD as PCP - General (Cardiology) Harl Bowie Alphonse Guild, MD as PCP - Cardiology (Cardiology)  Extended Emergency Contact Information Primary Emergency Contact: Dwana Curd Address: [address]          [place], [place] [address] [place] Phone: [phone] Relation: Spouse Secondary Emergency Contact: [place], [place], [place] [address] Johnnette Litter of [place] Phone: [phone] Relation: Daughter  Code Status: Full Code Goals of Care: Advanced Directive information Advanced Directives 03/26/2018  Does Patient Have a Medical Advance Directive? Yes  Type of Advance Directive (No Data)  Does patient want to make changes to medical advance directive? No - Patient declined  Copy of [place] in Chart? -  Would patient like information on creating a medical advance directive? No - Patient declined  Pre-existing out of facility DNR order (yellow form or pink MOST form) -      Chief Complaint  Patient presents with  . New Admit To SNF    New Admission Visit    HPI: Patient is a 72 y.o. female seen today for admission to  Past Medical History:  Diagnosis Date  . Breast cancer ([facility]) 11/2011  . Chronic back pain   . Chronic leg pain    bilateral  . Chronic leukopenia   . DDD (degenerative disc disease), cervical   . DDD (degenerative disc disease), lumbar   . Gait abnormality   . GERD (gastroesophageal reflux disease)   . Hypertension   . Parkinson's disease ([facility])   . Radiation 04/2012   33 treatments for breast cancer  . Right knee pain   . Thyroid disease    Past Surgical History:  Procedure Laterality Date  . ABDOMINAL HYSTERECTOMY     partial  . BREAST LUMPECTOMY Left 12/2011  . CHOLECYSTECTOMY    . COLONOSCOPY N/A  07/01/2013   Procedure: COLONOSCOPY;  Surgeon: Rogene Houston, MD;  [facility];  Service: Endoscopy;  Laterality: N/A;  1030  . ESOPHAGOGASTRODUODENOSCOPY N/A 11/21/2017   Procedure: ESOPHAGOGASTRODUODENOSCOPY (EGD);  Surgeon: Rogene Houston, MD;  [facility];  Service: Endoscopy;  Laterality: N/A;  2:45  . EXCISIONAL TOTAL KNEE ARTHROPLASTY WITH ANTIBIOTIC SPACERS Right 01/12/2018   Procedure: EXCISIONAL TOTAL KNEE ARTHROPLASTY WITH ANTIBIOTIC SPACERS;  Surgeon: Vickey Huger, MD;  Location: [facility];  Service: Orthopedics;  Laterality: Right;  . JOINT REPLACEMENT  01/2017   right knee  . KNEE SURGERY Right 01/12/2018   EXCISIONAL TOTAL KNEE ARTHROPLASTY WITH ANTIBIOTIC SPACERS  . TOTAL KNEE REVISION Right 03/23/2018   Procedure: RIGHT TOTAL KNEE REVISION;  Surgeon: Vickey Huger, MD;  Location[facility];  Service: Orthopedics;  Laterality: Right;  . TUBAL LIGATION      reports that she quit smoking about 19 years ago. Her smoking use included cigarettes. She has a 9.00 pack-year smoking history. She has never used smokeless tobacco. She reports that she does not drink alcohol or use drugs. Social History   Socioeconomic History  . Marital status: Married    Spouse name: Marcello Moores  . Number of children: 3  . Years of education: 24 th  . Highest education level: Not on file  Occupational History    Comment: retired  Scientific laboratory technician  .  Financial resource strain: Not on file  . Food insecurity:    Worry: Not on file    Inability: Not on file  . Transportation needs:    Medical: Not on file    Non-medical: Not on file  Tobacco Use  . Smoking status: Former Smoker    Packs/day: 0.50    Years: 18.00    Pack years: 9.00    Types: Cigarettes    Last attempt to quit: 09/30/1998    Years since quitting: 19.4  . Smokeless tobacco: Never Used  Substance and Sexual Activity  . Alcohol use: No  . Drug use: No  . Sexual activity: Not on file  Lifestyle  . Physical activity:      Days per week: Not on file    Minutes per session: Not on file  . Stress: Not on file  Relationships  . Social connections:    Talks on phone: Not on file    Gets together: Not on file    Attends religious service: Not on file    Active member of club or organization: Not on file    Attends meetings of clubs or organizations: Not on file    Relationship status: Not on file  . Intimate partner violence:    Fear of current or ex partner: Not on file    Emotionally abused: Not on file    Physically abused: Not on file    Forced sexual activity: Not on file  Other Topics Concern  . Not on file  Social History Narrative   Patient lives at home with her husband Marcello Moores).    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 Cancer Mother 73       colon cancer  . Cancer Father 75       throat cancer  . Pancreatic cancer Brother        63y 11-14-15    Health Maintenance  Topic Date Due  . MAMMOGRAM  04/26/2018 (Originally 01/22/2018)  . TETANUS/TDAP  04/26/2018 (Originally 01/27/1965)  . Hepatitis C Screening  04/26/2018 (Originally 1946-03-01)  . PNA vac Low Risk Adult (1 of 2 - PCV13) 04/26/2018 (Originally 01/28/2011)  . COLONOSCOPY  07/02/2023  . DEXA SCAN  Completed    Allergies  Allergen Reactions  . Hydrocodone Other (See Comments)    Heart pain  Delusions  . Tramadol Other (See Comments)    Hallucinations, abd pain Delusions (intolerance)    Outpatient Encounter Medications as of 03/26/2018  Medication Sig  . acetaminophen (TYLENOL) 500 MG tablet Take 1,000 mg by mouth 3 (three) times daily as needed for moderate pain or headache.   Marland Kitchen amLODipine (NORVASC) 5 MG tablet Take 5 mg by mouth every morning.   Marland Kitchen aspirin EC 325 MG EC tablet Take 1 tablet (325 mg total) by mouth 2 (two) times daily.  . Cyanocobalamin (VITAMIN B-12 PO) Take 2.5 mLs by mouth daily.  Marland Kitchen levothyroxine  (SYNTHROID, LEVOTHROID) 100 MCG tablet Take 100 mcg by mouth daily before breakfast.  . Melatonin 3 MG CAPS Take 3 mg by mouth at bedtime as needed (for sleep).   . methocarbamol (ROBAXIN) 500 MG tablet Take 1-2 tablets (500-1,000 mg total) by mouth every 6 (six) hours as needed for muscle spasms.  Marland Kitchen oxyCODONE (OXY IR/ROXICODONE) 5 MG immediate release tablet Take 1-2 tablets (5-10 mg total) by mouth every  6 (six) hours as needed for moderate pain (pain score 4-6).  . pantoprazole (PROTONIX) 40 MG tablet Take 1 tablet (40 mg total) by mouth daily before breakfast.  . polyethylene glycol powder (GLYCOLAX/MIRALAX) powder Take 17 g by mouth daily as needed for mild constipation or moderate constipation.  . traZODone (DESYREL) 50 MG tablet Take 50 mg by mouth at bedtime as needed for sleep.    No facility-administered encounter medications on file as of 03/26/2018.      Review of Systems  Vitals:   03/26/18 1039  BP: (!) 171/75  Pulse: 66  Resp: 20  Temp: 99.3 F (37.4 C)  TempSrc: Other (Comment)   There is no height or weight on file to calculate BMI. Physical Exam  Labs reviewed: Basic Metabolic Panel: Recent Labs    03/10/18 1036 03/23/18 1101 03/24/18 0510  NA 140 142 139  K 4.1 3.9 4.4  CL 105 107 106  CO2 27 30 24   GLUCOSE 86 92 105*  BUN 11 10 10   CREATININE 0.74 0.65 0.60  CALCIUM 9.6 9.7 9.0   Liver Function Tests: Recent Labs    10/10/17 2357 01/05/18 1345 02/23/18 1539 03/10/18 1036 03/23/18 1101  AST 15 18 18 19 19   ALT 12* 11 12 15 14   ALKPHOS 111 101  --   --  109  BILITOT 0.4 0.5 0.3 0.3 0.7  PROT 7.3 8.1 7.1 7.2 7.8  ALBUMIN 3.5 3.7  --   --  4.4   Recent Labs    10/10/17 2357  LIPASE 25   No results for input(s): AMMONIA in the last 8760 hours. CBC: Recent Labs    02/04/18 1700 02/23/18 1539 03/23/18 1101 03/24/18 0608 03/25/18 0501  WBC 3.4* 3.6* 3.1* 8.4 6.6  NEUTROABS 2,043 2,387 2.0  --   --   HGB 11.0* 11.4* 12.8 10.7* 9.3*    HCT 34.2* 35.5 39.9 32.3* 30.4*  MCV 86.6 84.9 86.9 85.7 88.4  PLT 257 212 275 206 198   Cardiac Enzymes: Recent Labs    10/10/17 2138 10/10/17 2357 10/11/17 1236  TROPONINI <0.03 <0.03 <0.03   BNP: Invalid input(s): POCBNP No results found for: HGBA1C Lab Results  Component Value Date   TSH 26.648 (H) 06/22/2016   Lab Results  Component Value Date   [id number] 836 08/24/2013   No results found for: FOLATE No results found for: IRON, TIBC, FERRITIN  Imaging and Procedures obtained prior to SNF admission: No results found.  Assessment/Plan There are no diagnoses linked to this encounter.   Family/ staff Communication:   Labs/tests ordered:   This encounter was created in error - please disregard.

## 2018-03-26 NOTE — Progress Notes (Signed)
Location:    [facility] Room Number: 143/P Place of Service:  SNF [phone]) Provider:  Granville Lewis PA-C  Sasser, Silvestre Moment, MD  Patient Care Team: [name], MD as PCP - General (Cardiology) Harl Bowie Alphonse Guild, MD as PCP - Cardiology (Cardiology)  Extended Emergency Contact Information Primary Emergency Contact: Dwana Curd Address: [address]          [place], [place] [address] [place] Phone: [phone] Relation: Spouse Secondary Emergency Contact: [place], [place], [place] [address] Johnnette Litter of [place] Phone: [phone] Relation: Daughter  Code Status:  Full Code Goals of care: Advanced Directive information Advanced Directives 03/26/2018  Does Patient Have a Medical Advance Directive? Yes  Type of Advance Directive (No Data)  Does patient want to make changes to medical advance directive? No - Patient declined  Copy of [place] in Chart? -  Would patient like information on creating a medical advance directive? No - Patient declined  Pre-existing out of facility DNR order (yellow form or pink MOST form) -     Chief Complaint  Patient presents with  . Hospitalization Follow-up    Hospitalization f/u visit    HPI:  Pt is a 72 y.o. female seen today for a hospital  follow-up after a right total knee revision  Patient was admitted for revision of a failed total knee replacement on the right- Apparently there were no complications- she was started on aspirin twice daily for DVT prophylaxis.  And was made weightbearing as tolerated with PT and OT.  Her other diagnoses include hypertension as well as hyperthyroidism insomnia GERD history of chronic pain of back and legs-with a history of degenerative joint disease.  She also has a history of left breast cancer status post radiation and lumpectomy.  Her main complaint is continued pain in her right knee leg- she does appear to  have a history of chronic pain she is on oxycodone 5 mg 1 or 2 tabs every 6 hours as needed for pain.  She also has an order for Robaxin 600 mg every 6 hours as needed  Vital signs appear to be stable her blood pressure is elevated I got a manual of 160/78- appears to have been running somewhat high in the hospital as well-I suspect pain may be contributing somewhat to this she is on Norvasc 5 mg a day.  It appears her blood pressure has come down from earlier in the day.      Past Medical History:  Diagnosis Date  . Breast cancer ([facility]) 11/2011  . Chronic back pain   . Chronic leg pain    bilateral  . Chronic leukopenia   . DDD (degenerative disc disease), cervical   . DDD (degenerative disc disease), lumbar   . Gait abnormality   . GERD (gastroesophageal reflux disease)   . Hypertension   . Parkinson's disease ([facility])   . Radiation 04/2012   33 treatments for breast cancer  . Right knee pain   . Thyroid disease    Past Surgical History:  Procedure Laterality Date  . ABDOMINAL HYSTERECTOMY     partial  . BREAST LUMPECTOMY Left 12/2011  . CHOLECYSTECTOMY    . COLONOSCOPY N/A 07/01/2013   Procedure: COLONOSCOPY;  Surgeon: Rogene Houston, MD;  [facility];  Service: Endoscopy;  Laterality: N/A;  1030  . ESOPHAGOGASTRODUODENOSCOPY N/A 11/21/2017   Procedure: ESOPHAGOGASTRODUODENOSCOPY (  EGD);  Surgeon: Rogene Houston, MD;  [facility];  Service: Endoscopy;  Laterality: N/A;  2:45  . EXCISIONAL TOTAL KNEE ARTHROPLASTY WITH ANTIBIOTIC SPACERS Right 01/12/2018   Procedure: EXCISIONAL TOTAL KNEE ARTHROPLASTY WITH ANTIBIOTIC SPACERS;  Surgeon: Vickey Huger, MD;  Location: [facility];  Service: Orthopedics;  Laterality: Right;  . JOINT REPLACEMENT  01/2017   right knee  . KNEE SURGERY Right 01/12/2018   EXCISIONAL TOTAL KNEE ARTHROPLASTY WITH ANTIBIOTIC SPACERS  . TOTAL KNEE REVISION Right 03/23/2018   Procedure: RIGHT TOTAL KNEE REVISION;  Surgeon: Vickey Huger, MD;   [facility];  Service: Orthopedics;  Laterality: Right;  . TUBAL LIGATION      Allergies  Allergen Reactions  . Hydrocodone Other (See Comments)    Heart pain  Delusions  . Tramadol Other (See Comments)    Hallucinations, abd pain Delusions (intolerance)    Outpatient Encounter Medications as of 03/26/2018  Medication Sig  . acetaminophen (TYLENOL) 500 MG tablet Take 1,000 mg by mouth 3 (three) times daily as needed for moderate pain or headache.   Marland Kitchen amLODipine (NORVASC) 5 MG tablet Take 5 mg by mouth every morning.   Marland Kitchen aspirin EC 325 MG EC tablet Take 1 tablet (325 mg total) by mouth 2 (two) times daily.  . Cyanocobalamin (VITAMIN B-12 PO) Take 2.5 mLs by mouth daily.  Marland Kitchen levothyroxine (SYNTHROID, LEVOTHROID) 100 MCG tablet Take 100 mcg by mouth daily before breakfast.  . Melatonin 3 MG CAPS Take 3 mg by mouth at bedtime as needed (for sleep).   . methocarbamol (ROBAXIN) 500 MG tablet Take 1-2 tablets (500-1,000 mg total) by mouth every 6 (six) hours as needed for muscle spasms.  Marland Kitchen oxyCODONE (OXY IR/ROXICODONE) 5 MG immediate release tablet Take 1-2 tablets (5-10 mg total) by mouth every 6 (six) hours as needed for moderate pain (pain score 4-6).  . pantoprazole (PROTONIX) 40 MG tablet Take 1 tablet (40 mg total) by mouth daily before breakfast.  . polyethylene glycol powder (GLYCOLAX/MIRALAX) powder Take 17 g by mouth daily as needed for mild constipation or moderate constipation.  . traZODone (DESYREL) 50 MG tablet Take 50 mg by mouth at bedtime as needed for sleep.    No facility-administered encounter medications on file as of 03/26/2018.      Review of Systems   General she does not complain of any fever or chills.  Skin does not complain of rashes itching or diaphoresis.  Head ears eyes nose mouth and throat is not complain of visual changes or sore throat.  Respiratory does not complain of shortness of breath or cough.  Cardiac does not complain of chest pain or  palpitations- does not appear to have significant lower extremity edema.  GI is not complaining of abdominal pain nausea vomiting-says she has not had a bowel movement however in several days.  GU is not complaining of dysuria.  Musculoskeletal does have complaints of pain mainly in her right knee leg-.  Neurologic is not complaint dizziness headache or numbness.  Psych does not complain of being anxious or depressed but does appear to be slightly anxious about her pain.  She also appears to have a history of insomnia on trazodone as well as melatonin as needed.    Immunization History  Administered Date(s) Administered  . Influenza,inj,Quad PF,6+ Mos 02/23/2018  . Influenza-Unspecified 02/28/2015   Pertinent  Health Maintenance Due  Topic Date Due  . MAMMOGRAM  04/26/2018 (Originally 01/22/2018)  . PNA vac Low Risk  Adult (1 of 2 - PCV13) 04/26/2018 (Originally 01/28/2011)  . COLONOSCOPY  07/02/2023  . DEXA SCAN  Completed   Fall Risk  02/23/2018 02/04/2018 06/26/2015 04/12/2015 05/03/2014  Falls in the past year? No No Yes Yes No  Number falls in past yr: - - 1 1 -  Injury with Fall? - - No No -  Risk Factor Category  - - - High Fall Risk -  Risk for fall due to : - - History of fall(s);Impaired balance/gait;Impaired mobility History of fall(s) -  Follow up - - - Falls evaluation completed -   Functional Status Survey:    Vitals:   03/26/18 1611  BP: (!) 171/75  Pulse: 66  Resp: 20  Temp: 99.3 F (37.4 C)  TempSrc: Oral  Of note manual pressure has come down to 160/78  Physical Exam   In general this is a well-nourished elderly female in no distress sitting in her wheelchair.  Her skin is warm and dry.  Eyes visual acuity appears to be intact sclera and conjunctive are clear.  Oropharynx is clear mucous membranes moist.  Chest is clear to auscultation there is no labored breathing.  Heart is regular rate and rhythm without murmur gallop or rub- she does not  appear to have significant lower extremity edema.  Abdomen is soft nontender with positive bowel sounds.  Musculoskeletal does have a cast on her lower right leg extending  to the proximal foot- she is able to move her toes capillary refill appears to be intact her skin is warm and dry.  Touch sensation appears to be intact  She did complain of right leg pain but when her leg was repositioned somewhat she said it did feel better  Appears to move her other extremities at baseline although limited exam since she is in a wheelchair.  Neurologic appears grossly intact her speech is clear touch sensation appears to be intact.  Psych she appears grossly alert and oriented pleasant and appropriate a bit anxious about her pain  Labs reviewed: Recent Labs    03/10/18 1036 03/23/18 1101 03/24/18 0510  NA 140 142 139  K 4.1 3.9 4.4  CL 105 107 106  CO2 27 30 24   GLUCOSE 86 92 105*  BUN 11 10 10   CREATININE 0.74 0.65 0.60  CALCIUM 9.6 9.7 9.0   Recent Labs    10/10/17 2357 01/05/18 1345 02/23/18 1539 03/10/18 1036 03/23/18 1101  AST 15 18 18 19 19   ALT 12* 11 12 15 14   ALKPHOS 111 101  --   --  109  BILITOT 0.4 0.5 0.3 0.3 0.7  PROT 7.3 8.1 7.1 7.2 7.8  ALBUMIN 3.5 3.7  --   --  4.4   Recent Labs    02/04/18 1700 02/23/18 1539 03/23/18 1101 03/24/18 0608 03/25/18 0501  WBC 3.4* 3.6* 3.1* 8.4 6.6  NEUTROABS 2,043 2,387 2.0  --   --   HGB 11.0* 11.4* 12.8 10.7* 9.3*  HCT 34.2* 35.5 39.9 32.3* 30.4*  MCV 86.6 84.9 86.9 85.7 88.4  PLT 257 212 275 206 198   Lab Results  Component Value Date   TSH 26.648 (H) 06/22/2016   No results found for: HGBA1C No results found for: CHOL, HDL, LDLCALC, LDLDIRECT, TRIG, CHOLHDL  Significant Diagnostic Results in last 30 days:  No results found.  Assessment/Plan #1 history of right knee revision- apparently tolerated procedure well- she has been discharged to skilled nursing for PT and OT-she is on aspirin  325 mg twice daily for  DVT prophylaxis- she is on oxycodone as noted above as needed for pain she also has a muscle relaxer PRN- she also has Tylenol- she is complaining of some pain repositioning appeared to help but I suspect this may be an issue during her stay here especially with therapy --at this point will monitor.  2.-  Anemia I suspect an element of postop hemoglobin has trended down somewhat at 9.3-will update this.  3.  Hypertension systolic blood pressure has been somewhat elevated per chart review it appears this was somewhat the case in the hospital as well-some of this may be pain related- would like to get more readings before adjusting medications she is on Norvasc 5 mg a day.  4.-  History of chronic pain leg and back pain with degenerative joint disease again she is on fairly extensive pain medications including oxycodone-muscle relaxer and Tylenol as noted above will monitor for now.  5.-History of GERD she continues on Protonix.  6.  History of hyporthyroidism she is on Synthroid-will update a TSH as well.  7.  History of insomnia she continues on trazodone as well as melatonin as needed.  8.  Constipation?  She does have an order for as needed MiraLAX nursing will give that to her tonight- also will obtain an abdominal x-ray-she does not appear to be in any distress in this regards.  [id number] note greater than 35 minutes spent assessing patient-reviewing her chart and labs- discussing her status with nursing staff- coordinating and formulating a plan of care for numerous diagnoses- of note greater than 50% of time spent coordinating a plan of care

## 2018-03-27 ENCOUNTER — Other Ambulatory Visit: Payer: Self-pay

## 2018-03-27 ENCOUNTER — Encounter (HOSPITAL_COMMUNITY)
Admission: RE | Admit: 2018-03-27 | Discharge: 2018-03-27 | Disposition: A | Discharge status: Home / Self Care | Payer: Medicare Other | Source: Skilled Nursing Facility | Attending: Internal Medicine | Admitting: Internal Medicine

## 2018-03-27 DIAGNOSIS — Z471 Aftercare following joint replacement surgery: Secondary | ICD-10-CM | POA: Insufficient documentation

## 2018-03-27 DIAGNOSIS — Z96651 Presence of right artificial knee joint: Secondary | ICD-10-CM | POA: Insufficient documentation

## 2018-03-27 DIAGNOSIS — N6320 Unspecified lump in the left breast, unspecified quadrant: Secondary | ICD-10-CM | POA: Insufficient documentation

## 2018-03-27 LAB — CBC WITH DIFFERENTIAL/PLATELET
Abs Immature Granulocytes: 0.05 10*3/uL (ref 0.00–0.07)
Basophils Absolute: 0 10*3/uL (ref 0.0–0.1)
Basophils Relative: 1 %
EOS PCT: 3 %
Eosinophils Absolute: 0.2 10*3/uL (ref 0.0–0.5)
HEMATOCRIT: 32.6 % — AB (ref 36.0–46.0)
HEMOGLOBIN: 10 g/dL — AB (ref 12.0–15.0)
Immature Granulocytes: 1 %
LYMPHS ABS: 0.7 10*3/uL (ref 0.7–4.0)
LYMPHS PCT: 14 %
MCH: 27 pg (ref 26.0–34.0)
MCHC: 30.7 g/dL (ref 30.0–36.0)
MCV: 88.1 fL (ref 80.0–100.0)
MONO ABS: 0.4 10*3/uL (ref 0.1–1.0)
MONOS PCT: 7 %
Neutro Abs: 3.8 10*3/uL (ref 1.7–7.7)
Neutrophils Relative %: 74 %
Platelets: 175 10*3/uL (ref 150–400)
RBC: 3.7 MIL/uL — ABNORMAL LOW (ref 3.87–5.11)
RDW: 13.5 % (ref 11.5–15.5)
WBC: 5.1 10*3/uL (ref 4.0–10.5)
nRBC: 0 % (ref 0.0–0.2)

## 2018-03-27 LAB — BASIC METABOLIC PANEL
ANION GAP: 6 (ref 5–15)
BUN: 6 mg/dL — ABNORMAL LOW (ref 8–23)
CO2: 29 mmol/L (ref 22–32)
Calcium: 9 mg/dL (ref 8.9–10.3)
Chloride: 104 mmol/L (ref 98–111)
Creatinine, Ser: 0.56 mg/dL (ref 0.44–1.00)
GFR calc Af Amer: >60 mL/min (ref >60)
GFR calc non Af Amer: >60 mL/min (ref >60)
GLUCOSE: 102 mg/dL — AB (ref 70–99)
POTASSIUM: 4 mmol/L (ref 3.5–5.1)
Sodium: 139 mmol/L (ref 135–145)

## 2018-03-27 LAB — TSH: TSH: 3.248 u[IU]/mL (ref 0.350–4.500)

## 2018-03-27 MED ORDER — OXYCODONE HCL 5 MG PO TABS: 5.0000 mg | ORAL_TABLET | Freq: Four times a day (QID) | ORAL | 0 refills | Status: DC | PRN

## 2018-03-30 ENCOUNTER — Non-Acute Institutional Stay (SKILLED_NURSING_FACILITY): Payer: Medicare Other | Admitting: Internal Medicine

## 2018-03-30 ENCOUNTER — Encounter: Payer: Self-pay | Admitting: Internal Medicine

## 2018-03-30 DIAGNOSIS — I10 Essential (primary) hypertension: Secondary | ICD-10-CM

## 2018-03-30 DIAGNOSIS — E039 Hypothyroidism, unspecified: Secondary | ICD-10-CM | POA: Diagnosis not present

## 2018-03-30 DIAGNOSIS — Z96651 Presence of right artificial knee joint: Secondary | ICD-10-CM

## 2018-03-30 NOTE — Progress Notes (Signed)
Provider: Veleta Miners MD  Location:    [facility] Room Number: 143/P Place of Service:  SNF (31)  PCP: [name], MD Patient Care Team: [name], MD as PCP - General (Cardiology) Harl Bowie Alphonse Guild, MD as PCP - Cardiology (Cardiology)  Extended Emergency Contact Information Primary Emergency Contact: Dwana Curd Address: [address]          [place], [place] [address] [place] Phone: [phone] Relation: Spouse Secondary Emergency Contact: [place], [place], [place] [address] Johnnette Litter of [place] Phone: [phone] Relation: Daughter  Code Status: Full Code Goals of Care: Advanced Directive information Advanced Directives 03/30/2018  Does Patient Have a Medical Advance Directive? Yes  Type of Advance Directive (No Data)  Does patient want to make changes to medical advance directive? No - Patient declined  Copy of [place] in Chart? -  Would patient like information on creating a medical advance directive? No - Patient declined  Pre-existing out of facility DNR order (yellow form or pink MOST form) -      Chief Complaint  Patient presents with  . New Admit To SNF    New Admission Visit    HPI: Patient is a 72 y.o. female seen today for admission to SNF for therapy after staying  in the hospital from 10/07-10/09 for revision of Right total knee replacement. Patient has a history of hypertension, hypothyroid, GERD, atypical chest pain and history of breast cancer. Patient had initial TKA in August 2018.  But due to pain her knee was aspirated in 07/19 And it showed infection with cultures positive for Corynebacterium striatum.  She was on IV antibiotics for 6 weeks through September 8.  She also went through two-stage revision by Dr. Lorre Nick.  She also had a antibiotic spacer placed. She had her revision Right TKA on 10/07.  Postop course was uncomplicated.  She has a hard  cast on that leg.  She is now in SNF for therapy. Her pain is right now controlled on oxycodone and Robaxin.  Her only complaint today was constipation. [place] any fever or chills. She was in another facility for IV antibiotics but lives with her husband and was independent before surgery.  Past Medical History:  Diagnosis Date  . Breast cancer ([facility]) 11/2011  . Chronic back pain   . Chronic leg pain    bilateral  . Chronic leukopenia   . DDD (degenerative disc disease), cervical   . DDD (degenerative disc disease), lumbar   . Gait abnormality   . GERD (gastroesophageal reflux disease)   . Hypertension   . Parkinson's disease ([facility])   . Radiation 04/2012   33 treatments for breast cancer  . Right knee pain   . Thyroid disease    Past Surgical History:  Procedure Laterality Date  . ABDOMINAL HYSTERECTOMY     partial  . BREAST LUMPECTOMY Left 12/2011  . CHOLECYSTECTOMY    . COLONOSCOPY N/A 07/01/2013   Procedure: COLONOSCOPY;  Surgeon: Rogene Houston, MD;  [facility];  Service: Endoscopy;  Laterality: N/A;  1030  . ESOPHAGOGASTRODUODENOSCOPY N/A 11/21/2017   Procedure: ESOPHAGOGASTRODUODENOSCOPY (EGD);  Surgeon: Rogene Houston, MD;  [facility];  Service: Endoscopy;  Laterality: N/A;  2:45  . EXCISIONAL TOTAL KNEE ARTHROPLASTY WITH ANTIBIOTIC SPACERS Right 01/12/2018   Procedure: EXCISIONAL TOTAL KNEE ARTHROPLASTY WITH ANTIBIOTIC SPACERS;  Surgeon: Vickey Huger,  MD;  Location: [facility];  Service: Orthopedics;  Laterality: Right;  . JOINT REPLACEMENT  01/2017   right knee  . KNEE SURGERY Right 01/12/2018   EXCISIONAL TOTAL KNEE ARTHROPLASTY WITH ANTIBIOTIC SPACERS  . TOTAL KNEE REVISION Right 03/23/2018   Procedure: RIGHT TOTAL KNEE REVISION;  Surgeon: Vickey Huger, MD;  Location: [facility];  Service: Orthopedics;  Laterality: Right;  . TUBAL LIGATION      reports that she quit smoking about 19 years ago. Her smoking use included cigarettes. She has a 9.00 pack-year  smoking history. She has never used smokeless tobacco. She reports that she does not drink alcohol or use drugs. Social History   Socioeconomic History  . Marital status: Married    Spouse name: Marcello Moores  . Number of children: 3  . Years of education: 36 th  . Highest education level: Not on file  Occupational History    Comment: retired  Scientific laboratory technician  . Financial resource strain: Not on file  . Food insecurity:    Worry: Not on file    Inability: Not on file  . Transportation needs:    Medical: Not on file    Non-medical: Not on file  Tobacco Use  . Smoking status: Former Smoker    Packs/day: 0.50    Years: 18.00    Pack years: 9.00    Types: Cigarettes    Last attempt to quit: 09/30/1998    Years since quitting: 19.5  . Smokeless tobacco: Never Used  Substance and Sexual Activity  . Alcohol use: No  . Drug use: No  . Sexual activity: Not on file  Lifestyle  . Physical activity:    Days per week: Not on file    Minutes per session: Not on file  . Stress: Not on file  Relationships  . Social connections:    Talks on phone: Not on file    Gets together: Not on file    Attends religious service: Not on file    Active member of club or organization: Not on file    Attends meetings of clubs or organizations: Not on file    Relationship status: Not on file  . Intimate partner violence:    Fear of current or ex partner: Not on file    Emotionally abused: Not on file    Physically abused: Not on file    Forced sexual activity: Not on file  Other Topics Concern  . Not on file  Social History Narrative   Patient lives at home with her husband Marcello Moores).    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 Cancer Mother 74       colon cancer  . Cancer Father 75       throat cancer  . Pancreatic cancer Brother        63y 11-14-15    Health Maintenance  Topic Date Due  . MAMMOGRAM   04/26/2018 (Originally 01/22/2018)  . TETANUS/TDAP  04/26/2018 (Originally 01/27/1965)  . Hepatitis C Screening  04/26/2018 (Originally [birth year])  . PNA vac Low Risk Adult (1 of 2 - PCV13) 04/26/2018 (Originally 01/28/2011)  . COLONOSCOPY  07/02/2023  . DEXA SCAN  Completed    Allergies  Allergen Reactions  . Hydrocodone Other (See Comments)    Heart pain  Delusions  . Tramadol Other (See Comments)    Hallucinations, abd pain  Delusions (intolerance)    Outpatient Encounter Medications as of 03/30/2018  Medication Sig  . acetaminophen (TYLENOL) 500 MG tablet Take 1,000 mg by mouth 3 (three) times daily as needed for moderate pain or headache.   Marland Kitchen amLODipine (NORVASC) 5 MG tablet Take 5 mg by mouth every morning.   Marland Kitchen aspirin EC 325 MG EC tablet Take 1 tablet (325 mg total) by mouth 2 (two) times daily.  . Cyanocobalamin (VITAMIN B-12 PO) Take 2.5 mLs by mouth daily.  Marland Kitchen levothyroxine (SYNTHROID, LEVOTHROID) 100 MCG tablet Take 100 mcg by mouth daily before breakfast.  . Melatonin 3 MG CAPS Take 3 mg by mouth at bedtime as needed (for sleep).   . methocarbamol (ROBAXIN) 500 MG tablet Take 500 mg by mouth every 6 (six) hours as needed for muscle spasms.  Marland Kitchen oxyCODONE (OXY IR/ROXICODONE) 5 MG immediate release tablet Take 1-2 tablets (5-10 mg total) by mouth every 6 (six) hours as needed for moderate pain (pain score 4-6).  . pantoprazole (PROTONIX) 40 MG tablet Take 1 tablet (40 mg total) by mouth daily before breakfast.  . polyethylene glycol powder (GLYCOLAX/MIRALAX) powder Take 17 g by mouth daily as needed for mild constipation or moderate constipation.  . traZODone (DESYREL) 50 MG tablet Take 50 mg by mouth at bedtime as needed for sleep.   . [DISCONTINUED] methocarbamol (ROBAXIN) 500 MG tablet Take 1-2 tablets (500-1,000 mg total) by mouth every 6 (six) hours as needed for muscle spasms. (Patient taking differently: Take 500 mg by mouth every 6 (six) hours as needed for muscle spasms. )     No facility-administered encounter medications on file as of 03/30/2018.      Review of Systems  Review of Systems  Constitutional: Negative for activity change, appetite change, chills, diaphoresis, fatigue and fever.  HENT: Negative for mouth sores, postnasal drip, rhinorrhea, sinus pain and sore throat.   Respiratory: Negative for apnea, cough, chest tightness, shortness of breath and wheezing.   Cardiovascular: Negative for chest pain, palpitations and leg swelling.  Gastrointestinal: Negative for abdominal distention, abdominal pain,  diarrhea, nausea and vomiting.  Genitourinary: Negative for dysuria and frequency.  Musculoskeletal: Negative for arthralgias, joint swelling and myalgias.  Skin: Negative for rash.  Neurological: Negative for dizziness, syncope, weakness, light-headedness and numbness.  Psychiatric/Behavioral: Negative for behavioral problems, confusion and sleep disturbance.     Vitals:   03/30/18 1526  BP: (!) 152/74  Pulse: 81  Resp: 17  Temp: (!) 97.1 F (36.2 C)  TempSrc: Oral   There is no height or weight on file to calculate BMI. Physical Exam  Constitutional: Oriented to person, place, and time. Well-developed and well-nourished.  HENT:  Head: Normocephalic.  Mouth/Throat: Oropharynx is clear and moist.  Eyes: Pupils are equal, round, and reactive to light.  Neck: Neck supple.  Cardiovascular: Normal rate and normal heart sounds.  No murmur heard. Pulmonary/Chest: Effort normal and breath sounds normal. No respiratory distress. No wheezes. She has no rales.  Abdominal: Soft. Bowel sounds are normal. No distension. There is no tenderness. There is no rebound.  Musculoskeletal: No edema. Has hard Cast in her Right LE. Lymphadenopathy: none Neurological: Alert and oriented to person, place, and time.  Skin: Skin is warm and dry.  Psychiatric: Normal mood and affect. Behavior is normal. Thought content normal.    Labs reviewed: Basic  Metabolic Panel: Recent Labs    03/23/18 1101 03/24/18 0510 03/27/18 0700  NA 142 139 139  K 3.9 4.4 4.0  CL 107 106 104  CO2 30 24 29   GLUCOSE 92 105* 102*  BUN 10 10 6*  CREATININE 0.65 0.60 0.56  CALCIUM 9.7 9.0 9.0   Liver Function Tests: Recent Labs    10/10/17 2357 01/05/18 1345 02/23/18 1539 03/10/18 1036 03/23/18 1101  AST 15 18 18 19 19   ALT 12* 11 12 15 14   ALKPHOS 111 101  --   --  109  BILITOT 0.4 0.5 0.3 0.3 0.7  PROT 7.3 8.1 7.1 7.2 7.8  ALBUMIN 3.5 3.7  --   --  4.4   Recent Labs    10/10/17 2357  LIPASE 25   No results for input(s): AMMONIA in the last 8760 hours. CBC: Recent Labs    02/23/18 1539 03/23/18 1101 03/24/18 0608 03/25/18 0501 03/27/18 0700  WBC 3.6* 3.1* 8.4 6.6 5.1  NEUTROABS 2,387 2.0  --   --  3.8  HGB 11.4* 12.8 10.7* 9.3* 10.0*  HCT 35.5 39.9 32.3* 30.4* 32.6*  MCV 84.9 86.9 85.7 88.4 88.1  PLT 212 275 206 198 175   Cardiac Enzymes: Recent Labs    10/10/17 2138 10/10/17 2357 10/11/17 1236  TROPONINI <0.03 <0.03 <0.03   BNP: Invalid input(s): POCBNP No results found for: HGBA1C Lab Results  Component Value Date   TSH 3.248 03/27/2018   Lab Results  Component Value Date   [id number] 270 08/24/2013   No results found for: FOLATE No results found for: IRON, TIBC, FERRITIN  Imaging and Procedures obtained prior to SNF admission: No results found.  Assessment/Plan Essential hypertension BP Mildily Elevated. Continue on Amlodipine  Status post revision of total replacement of right knee Pain Controlled now on Robaxin and Oxycodone WBAT On Aspirin BID Follow up with Ortho Hypothyroidism TSh was normal in facility Continue Same dose Anemia Hgb Stable Post op. Insomnia Continue on Trazodone and Melatonin. Constipation Will write for Suppository at patient request    Family/ staff Communication:   Labs/tests ordered: Total time spent in this patient care encounter was _45 minutes; greater  than 50% of the visit spent counseling patient, reviewing records , Labs and coordinating care for problems addressed at this encounter.

## 2018-04-06 ENCOUNTER — Encounter (HOSPITAL_COMMUNITY)
Admission: RE | Admit: 2018-04-06 | Discharge: 2018-04-06 | Disposition: A | Discharge status: Home / Self Care | Payer: Medicare Other | Source: Skilled Nursing Facility | Attending: Internal Medicine | Admitting: Internal Medicine

## 2018-04-06 DIAGNOSIS — N6320 Unspecified lump in the left breast, unspecified quadrant: Secondary | ICD-10-CM | POA: Insufficient documentation

## 2018-04-06 DIAGNOSIS — Z471 Aftercare following joint replacement surgery: Secondary | ICD-10-CM | POA: Insufficient documentation

## 2018-04-06 DIAGNOSIS — Z96651 Presence of right artificial knee joint: Secondary | ICD-10-CM | POA: Insufficient documentation

## 2018-04-06 LAB — BASIC METABOLIC PANEL
ANION GAP: 8 (ref 5–15)
BUN: 10 mg/dL (ref 8–23)
CHLORIDE: 105 mmol/L (ref 98–111)
CO2: 26 mmol/L (ref 22–32)
Calcium: 9.4 mg/dL (ref 8.9–10.3)
Creatinine, Ser: 0.68 mg/dL (ref 0.44–1.00)
GFR calc Af Amer: >60 mL/min (ref >60)
Glucose, Bld: 80 mg/dL (ref 70–99)
POTASSIUM: 3.9 mmol/L (ref 3.5–5.1)
SODIUM: 139 mmol/L (ref 135–145)

## 2018-04-06 LAB — CBC
HCT: 36.6 % (ref 36.0–46.0)
HEMOGLOBIN: 11 g/dL — AB (ref 12.0–15.0)
MCH: 27.2 pg (ref 26.0–34.0)
MCHC: 30.1 g/dL (ref 30.0–36.0)
MCV: 90.6 fL (ref 80.0–100.0)
NRBC: 0 % (ref 0.0–0.2)
PLATELETS: 345 10*3/uL (ref 150–400)
RBC: 4.04 MIL/uL (ref 3.87–5.11)
RDW: 15.1 % (ref 11.5–15.5)
WBC: 3.8 10*3/uL — ABNORMAL LOW (ref 4.0–10.5)

## 2018-04-09 ENCOUNTER — Encounter: Payer: Self-pay | Admitting: Internal Medicine

## 2018-04-09 ENCOUNTER — Non-Acute Institutional Stay (SKILLED_NURSING_FACILITY): Payer: Medicare Other | Admitting: Internal Medicine

## 2018-04-09 DIAGNOSIS — G20 Parkinson's disease: Secondary | ICD-10-CM | POA: Diagnosis not present

## 2018-04-09 DIAGNOSIS — T8450XD Infection and inflammatory reaction due to unspecified internal joint prosthesis, subsequent encounter: Secondary | ICD-10-CM | POA: Diagnosis not present

## 2018-04-09 DIAGNOSIS — E039 Hypothyroidism, unspecified: Secondary | ICD-10-CM | POA: Diagnosis not present

## 2018-04-09 DIAGNOSIS — I10 Essential (primary) hypertension: Secondary | ICD-10-CM | POA: Diagnosis not present

## 2018-04-09 DIAGNOSIS — D649 Anemia, unspecified: Secondary | ICD-10-CM

## 2018-04-09 NOTE — Progress Notes (Signed)
Location:    [facility] Room Number: 143/P Place of Service:  SNF [phone]) Provider:  Halford Chessman, MD  Patient Care Team: [name], MD as PCP - General (Cardiology) Harl Bowie Alphonse Guild, MD as PCP - Cardiology (Cardiology)  Extended Emergency Contact Information Primary Emergency Contact: Dwana Curd Address: [address]          [place], [place] [address] [place] Phone: [phone] Relation: Spouse Secondary Emergency Contact: [place], [place], [place] [address] Johnnette Litter of [place] Phone: [phone] Relation: Daughter  Code Status:  Full Code Goals of care: Advanced Directive information Advanced Directives 04/09/2018  Does Patient Have a Medical Advance Directive? Yes  Type of Advance Directive (No Data)  Does patient want to make changes to medical advance directive? No - Patient declined  Copy of [place] in Chart? -  Would patient like information on creating a medical advance directive? No - Patient declined  Pre-existing out of facility DNR order (yellow form or pink MOST form) -     Chief Complaint  Patient presents with  . Acute Visit    Patients c/o Tremors    HPI:  Pt is a 72 y.o. female seen today for an acute visit for follow-up of tremors.  Patient is here for rehab after undergoing revision of a right knee replacement.  She also has a history of hypertension as well as hypothyroidism GERD chest pain and breast cancer.  In regards to her right knee revision- she has completed IV antibiotics.  As well as a two-stage revision.  She did have an antibiotic spacer placed as well.  Postop course was uncomplicated she continues with a hard cast on her leg and she is here for therapy pain appears to be relatively well controlled on oxycodone as well as Robaxin.  Per chart review it appears she does have a history of Parkinson's- and has been  followed by [facility] neurology.  This is apparently most prominent of her right upper extremity.  Thought to be idiopathic Parkinson's disease.--Lab work did not show any possible etiology B12 RPI sed rate TSH and HIV tests were unremarkable    It appears per review of various neurology notes at one point she was on Mirapex as well as Sinemet.  .  The last note I see from neurology was in June 2018 and at that time she was on Mirapex as well as Sinemet- talking with patient apparently there was supposed to be follow-up but apparently her other medical issues including the knee issues altered her schedule.  She said she eventually quit taking the Sinemet and Mirapex because she thought it was not helping.  She does have a right upper extremity tremor which does appear to be intermittent she also appears to have some nuchal rigidity more on the right upper extremity versus the left.  She states this is not new.  In regards to her other issues she appears to be doing relatively well with her knee revision she will have follow-up later this month.  She does have a history of hypertension as well she is on Norvasc manual blood pressure today was 138/80.  Regards to hypothyroidism she is on Synthroid and TSH done on October 11 was within normal range at 3.428.  She also had postop anemia but this is improving with a hemoglobin of 11.0 on lab done on October  21.  In regards to her tremor she is not really complaining of any dizziness or headache- gait stability has been somewhat difficult to assess secondary again to her knee revision.         Past Medical History:  Diagnosis Date  . Breast cancer ([facility]) 11/2011  . Chronic back pain   . Chronic leg pain    bilateral  . Chronic leukopenia   . DDD (degenerative disc disease), cervical   . DDD (degenerative disc disease), lumbar   . Gait abnormality   . GERD (gastroesophageal reflux disease)   . Hypertension   . Parkinson's disease  ([facility])   . Radiation 04/2012   33 treatments for breast cancer  . Right knee pain   . Thyroid disease    Past Surgical History:  Procedure Laterality Date  . ABDOMINAL HYSTERECTOMY     partial  . BREAST LUMPECTOMY Left 12/2011  . CHOLECYSTECTOMY    . COLONOSCOPY N/A 07/01/2013   Procedure: COLONOSCOPY;  Surgeon: Rogene [place], MD;  [facility];  Service: Endoscopy;  Laterality: N/A;  1030  . ESOPHAGOGASTRODUODENOSCOPY N/A 11/21/2017   Procedure: ESOPHAGOGASTRODUODENOSCOPY (EGD);  Surgeon: Rogene [place], MD;  [facility];  Service: Endoscopy;  Laterality: N/A;  2:45  . EXCISIONAL TOTAL KNEE ARTHROPLASTY WITH ANTIBIOTIC SPACERS Right 01/12/2018   Procedure: EXCISIONAL TOTAL KNEE ARTHROPLASTY WITH ANTIBIOTIC SPACERS;  Surgeon: Vickey Huger, MD;  Location: [facility];  Service: Orthopedics;  Laterality: Right;  . JOINT REPLACEMENT  01/2017   right knee  . KNEE SURGERY Right 01/12/2018   EXCISIONAL TOTAL KNEE ARTHROPLASTY WITH ANTIBIOTIC SPACERS  . TOTAL KNEE REVISION Right 03/23/2018   Procedure: RIGHT TOTAL KNEE REVISION;  Surgeon: Vickey Huger, MD;  [facility];  Service: Orthopedics;  Laterality: Right;  . TUBAL LIGATION      Allergies  Allergen Reactions  . Hydrocodone Other (See Comments)    Heart pain  Delusions  . Tramadol Other (See Comments)    Hallucinations, abd pain Delusions (intolerance)    Outpatient Encounter Medications as of 04/09/2018  Medication Sig  . acetaminophen (TYLENOL) 500 MG tablet Take 1,000 mg by mouth 3 (three) times daily as needed for moderate pain or headache.   Marland Kitchen amLODipine (NORVASC) 5 MG tablet Take 5 mg by mouth every morning.   Marland Kitchen aspirin EC 325 MG EC tablet Take 1 tablet (325 mg total) by mouth 2 (two) times daily.  . bisacodyl (DULCOLAX) 10 MG suppository Place 10 mg rectally daily as needed for moderate constipation.  . Cyanocobalamin (VITAMIN B-12 PO) Take 2.5 mLs by mouth daily.  Marland Kitchen levothyroxine (SYNTHROID,  LEVOTHROID) 100 MCG tablet Take 100 mcg by mouth daily before breakfast.  . Melatonin 3 MG CAPS Take 3 mg by mouth at bedtime as needed (for sleep).   . methocarbamol (ROBAXIN) 500 MG tablet Take 500 mg by mouth every 6 (six) hours as needed for muscle spasms.  Marland Kitchen oxyCODONE (OXY IR/ROXICODONE) 5 MG immediate release tablet Take 1-2 tablets (5-10 mg total) by mouth every 6 (six) hours as needed for moderate pain (pain score 4-6).  . pantoprazole (PROTONIX) 40 MG tablet Take 1 tablet (40 mg total) by mouth daily before breakfast.  . polyethylene glycol powder (GLYCOLAX/MIRALAX) powder Take 17 g by mouth daily as needed for mild constipation or moderate constipation.  . [DISCONTINUED] traZODone (DESYREL) 50 MG tablet Take 50 mg by mouth at bedtime as needed for sleep.    No facility-administered encounter medications on  file as of 04/09/2018.     Review of Systems   General she is not complaining of any fever chills her weight appears to be stable.  Skin is not complain of rashes or itching.  Head ears eyes nose mouth and throat is not complain of visual changes or sore throat.  Respiratory does not complain of cough or shortness of breath.  Cardiac denies chest pain.  GI is not complaining of abdominal pain nausea vomiting diarrhea constipation.  GU is not complaining of dysuria.  Musculoskeletal at times will complain of knee and leg pain but apparently repositioning and the oxycodone and Robaxin to help  Neurologic does not complain of headache or dizziness says she has some numbness lower extremities but this was present before surgery has not progressed-possibly slowly improving.  Psych does not complain of being depressed or anxious     Immunization History  Administered Date(s) Administered  . Influenza,inj,Quad PF,6+ Mos 02/23/2018  . Influenza-Unspecified 02/28/2015   Pertinent  Health Maintenance Due  Topic Date Due  . MAMMOGRAM  04/26/2018 (Originally 01/22/2018)  .  PNA vac Low Risk Adult (1 of 2 - PCV13) 04/26/2018 (Originally 01/28/2011)  . COLONOSCOPY  07/02/2023  . DEXA SCAN  Completed   Fall Risk  02/23/2018 02/04/2018 06/26/2015 04/12/2015 05/03/2014  Falls in the past year? No No Yes Yes No  Number falls in past yr: - - 1 1 -  Injury with Fall? - - No No -  Risk Factor Category  - - - High Fall Risk -  Risk for fall due to : - - History of fall(s);Impaired balance/gait;Impaired mobility History of fall(s) -  Follow up - - - Falls evaluation completed -   Functional Status Survey:    Vitals:   04/09/18 1515  BP: 138/80  Pulse: 72  She is afebrile respirations of 18  Physical Exam   In general this is a pleasant elderly female in no distress.  Her skin is warm and dry.  I visual acuity appears grossly intact sclera and conjunctive are clear she has prescription lenses.  Oropharynx is clear mucous membranes moist.  Chest is clear to auscultation there is no labored breathing.  Heart is regular rate and rhythm without murmur gallop or rub- she does not appear to have significant lower extremity edema but this is difficult to fully assess secondary to hard cast on right leg and Ace wrapping of her left leg.  Abdomen is soft nontender with positive bowel sounds.  Musculoskeletal continues with a cast on her right leg- has Ace wrapping on left leg- pedal pulses are intact bilaterally as well as capillary refill.  Moves her upper extremities at baseline.  Neurologic is grossly intact her speech is clear no lateralizing findings she does have an intermittent tremor most prominent of her right upper extremity- this appears to improve somewhat with intention.  She does have nuchal rigidity upper extremities bilaterally but most prominent on the right.  Psych she is alert and oriented pleasant and appropriate  Labs reviewed: Recent Labs    03/24/18 0510 03/27/18 0700 04/06/18 0700  NA 139 139 139  K 4.4 4.0 3.9  CL 106 104 105  CO2  24 29 26   GLUCOSE 105* 102* 80  BUN 10 6* 10  CREATININE 0.60 0.56 0.68  CALCIUM 9.0 9.0 9.4   Recent Labs    10/10/17 2357 01/05/18 1345 02/23/18 1539 03/10/18 1036 03/23/18 1101  AST 15 18 18 19 19   ALT 12*  11 12 15 14   ALKPHOS 111 101  --   --  109  BILITOT 0.4 0.5 0.3 0.3 0.7  PROT 7.3 8.1 7.1 7.2 7.8  ALBUMIN 3.5 3.7  --   --  4.4   Recent Labs    02/23/18 1539 03/23/18 1101  03/25/18 0501 03/27/18 0700 04/06/18 0700  WBC 3.6* 3.1*   < > 6.6 5.1 3.8*  NEUTROABS 2,387 2.0  --   --  3.8  --   HGB 11.4* 12.8   < > 9.3* 10.0* 11.0*  HCT 35.5 39.9   < > 30.4* 32.6* 36.6  MCV 84.9 86.9   < > 88.4 88.1 90.6  PLT 212 275   < > 198 175 345   < > = values in this interval not displayed.   Lab Results  Component Value Date   TSH 3.248 03/27/2018   No results found for: HGBA1C No results found for: CHOL, HDL, LDLCALC, LDLDIRECT, TRIG, CHOLHDL  Significant Diagnostic Results in last 30 days:  No results found.  Assessment/Plan  #1 history of tremor most prominently on the right- per fairly extensive chart review it appears this is not new and that she does have a history of idiopathic parkinsonian is him- as noted above she has been on Sinemet and Mirapex in the past but apparently she has quit taking this feeling it did not really help.  -I did discuss this with her about options and will order a neurologic consult- previous neurologist was in [place] she would prefer someone closer to [place] and will write an order for such.  At this point would be hesitant to restart any medication without neurology input.  2.  History of right knee replacement written revision-she appears to be progressing with therapy she continues on aspirin for anticoagulation as well as oxycodone and Robaxin for pain- she does have follow-up arranged for later this month.  3.  History of postop anemia this appears to be improving with a hemoglobin of 11.0 on lab done on October 21.  4.   Hypothyroidism I do not believe this is contributing to her tremors TSH has been within normal limits it was 3.428 earlier this month.  5 HTN--at this point will continue to monitor she is on Norvasc blood pressure was 138/80 today.  Her 6 history of atypical chest pain this is not really been an issue during here.  7.  History of B12 deficiency she is on supplementation--will recheck level--   [id number]-of note greater than 40 minutes spent assessing patient- extensive review of her chart--especially in regards to her tremor and neurology history-reviewing her labs and coordinating and formulating a plan of care- of note greater than 50% of time spent coordinating a plan of care with input as noted above

## 2018-04-10 ENCOUNTER — Encounter (HOSPITAL_COMMUNITY)
Admission: RE | Admit: 2018-04-10 | Discharge: 2018-04-10 | Disposition: A | Discharge status: Home / Self Care | Payer: Medicare Other | Source: Skilled Nursing Facility | Attending: Internal Medicine | Admitting: Internal Medicine

## 2018-04-10 DIAGNOSIS — Z471 Aftercare following joint replacement surgery: Secondary | ICD-10-CM | POA: Insufficient documentation

## 2018-04-10 DIAGNOSIS — N6320 Unspecified lump in the left breast, unspecified quadrant: Secondary | ICD-10-CM | POA: Insufficient documentation

## 2018-04-10 DIAGNOSIS — R279 Unspecified lack of coordination: Secondary | ICD-10-CM | POA: Insufficient documentation

## 2018-04-10 DIAGNOSIS — Z96651 Presence of right artificial knee joint: Secondary | ICD-10-CM | POA: Insufficient documentation

## 2018-04-10 DIAGNOSIS — D72819 Decreased white blood cell count, unspecified: Secondary | ICD-10-CM | POA: Insufficient documentation

## 2018-04-10 LAB — VITAMIN B12: Vitamin B-12: 1660 pg/mL — ABNORMAL HIGH (ref 180–914)

## 2018-04-14 ENCOUNTER — Encounter: Payer: Self-pay | Admitting: Internal Medicine

## 2018-04-14 ENCOUNTER — Non-Acute Institutional Stay (SKILLED_NURSING_FACILITY): Payer: Medicare Other | Admitting: Internal Medicine

## 2018-04-14 DIAGNOSIS — E538 Deficiency of other specified B group vitamins: Secondary | ICD-10-CM

## 2018-04-14 DIAGNOSIS — Z96651 Presence of right artificial knee joint: Secondary | ICD-10-CM | POA: Diagnosis not present

## 2018-04-14 DIAGNOSIS — I10 Essential (primary) hypertension: Secondary | ICD-10-CM | POA: Diagnosis not present

## 2018-04-14 DIAGNOSIS — E039 Hypothyroidism, unspecified: Secondary | ICD-10-CM

## 2018-04-14 DIAGNOSIS — D649 Anemia, unspecified: Secondary | ICD-10-CM | POA: Diagnosis not present

## 2018-04-14 NOTE — Progress Notes (Signed)
Location:    [facility] Room Number: 143/P Place of Service:  SNF [phone])  Provider: Granville Lewis PA-C  PCP: [name], MD Patient Care Team: [name], MD as PCP - General (Cardiology) Harl Bowie Alphonse Guild, MD as PCP - Cardiology (Cardiology)  Extended Emergency Contact Information Primary Emergency Contact: Dwana Curd Address: [address]          [place], [place] [address] [address] [place] of [place] Phone: [phone] Relation: Spouse Secondary Emergency Contact: [place], [place], [place] [address] [address] of [place] Phone: [phone] Relation: Daughter  Code Status: Full Code Goals of care:  Advanced Directive information Advanced Directives 04/14/2018  Does Patient Have a Medical Advance Directive? Yes  Type of Advance Directive (No Data)  Does patient want to make changes to medical advance directive? No - Patient declined  Copy of [place] in Chart? -  Would patient like information on creating a medical advance directive? No - Patient declined  Pre-existing out of facility DNR order (yellow form or pink MOST form) -     Allergies  Allergen Reactions  . Hydrocodone Other (See Comments)    Heart pain  Delusions  . Tramadol Other (See Comments)    Hallucinations, abd pain Delusions (intolerance)    Chief Complaint  Patient presents with  . Discharge Note    Discharge Visit    HPI:  72 y.o. female seen today for discharge from facility later this week.  She was here for rehab after undergoing a right knee revision- she completed her IV antibiotics in the hospital and was here for rehab.  She apparently is progressing well with rehab and will be going home-she will need continued PT and OT as well as orthopedic follow-up which is been arranged.  Regards to pain control she does require oxycodone at times and this appears to be effective she also has Robaxin as needed- she  continues on aspirin twice daily for DVT prophylaxis.  Her other medical issues appear to be relatively stable I saw her recently for tremors-it appears she does have a history of Parkinson's thought to be idiopathic and has been followed by [facility] neurology in the past but apparently has not follow-up because of her recent knee issues- she also would like to see a neurologist closer to her home in [place] and this is attempting to be arranged-this will need follow-up obviously as an outpatient-one point she been on medication including Sinemet and Mirapex but appears she stopped taking this because she said it was not helping  In regards to hypertension she continues on Norvasc appears to have somewhat variable blood pressures I got 148/76 manually today previous reading 118/62- since her stay here is quite short will defer to primary care provider for any changes.  She also has a history of hypothyroidism on Synthroid TSH has been within normal range most recently 3.428 on lab done earlier this month.  She did have postop anemia her hemoglobin continues to rise it was 11 on the lab done on October 11.  She also has a history of B12 deficiency on supplementation and recent labs showed adequate stores.  Currently she is sitting in her wheelchair comfortably has just returned from therapy and states she is doing well with this.  She will need again follow-up by orthopedics as well as her primary care provider and neurology.   Past Medical History:  Diagnosis  Date  . Breast cancer ([place]) 11/2011  . Chronic back pain   . Chronic leg pain    bilateral  . Chronic leukopenia   . DDD (degenerative disc disease), cervical   . DDD (degenerative disc disease), lumbar   . Gait abnormality   . GERD (gastroesophageal reflux disease)   . Hypertension   . Parkinson's disease ([place])   . Radiation 04/2012   33 treatments for breast cancer  . Right knee pain   . Thyroid disease     Past Surgical  History:  Procedure Laterality Date  . ABDOMINAL HYSTERECTOMY     partial  . BREAST LUMPECTOMY Left 12/2011  . CHOLECYSTECTOMY    . COLONOSCOPY N/A 07/01/2013   Procedure: COLONOSCOPY;  Surgeon: Rogene Houston, MD;  [facility];  Service: Endoscopy;  Laterality: N/A;  1030  . ESOPHAGOGASTRODUODENOSCOPY N/A 11/21/2017   Procedure: ESOPHAGOGASTRODUODENOSCOPY (EGD);  Surgeon: Rogene Houston, MD;  Location: [facility];  Service: Endoscopy;  Laterality: N/A;  2:45  . EXCISIONAL TOTAL KNEE ARTHROPLASTY WITH ANTIBIOTIC SPACERS Right 01/12/2018   Procedure: EXCISIONAL TOTAL KNEE ARTHROPLASTY WITH ANTIBIOTIC SPACERS;  Surgeon: Vickey Huger, MD;  Location: [facility];  Service: Orthopedics;  Laterality: Right;  . JOINT REPLACEMENT  01/2017   right knee  . KNEE SURGERY Right 01/12/2018   EXCISIONAL TOTAL KNEE ARTHROPLASTY WITH ANTIBIOTIC SPACERS  . TOTAL KNEE REVISION Right 03/23/2018   Procedure: RIGHT TOTAL KNEE REVISION;  Surgeon: Vickey Huger, MD;  Location: [facility];  Service: Orthopedics;  Laterality: Right;  . TUBAL LIGATION        reports that she quit smoking about 19 years ago. Her smoking use included cigarettes. She has a 9.00 pack-year smoking history. She has never used smokeless tobacco. She reports that she does not drink alcohol or use drugs. Social History   Socioeconomic History  . Marital status: Married    Spouse name: Marcello Moores  . Number of children: 3  . Years of education: 44 th  . Highest education level: Not on file  Occupational History    Comment: retired  Scientific laboratory technician  . Financial resource strain: Not on file  . Food insecurity:    Worry: Not on file    Inability: Not on file  . Transportation needs:    Medical: Not on file    Non-medical: Not on file  Tobacco Use  . Smoking status: Former Smoker    Packs/day: 0.50    Years: 18.00    Pack years: 9.00    Types: Cigarettes    Last attempt to quit: 09/30/1998    Years since quitting: 19.5  . Smokeless  tobacco: Never Used  Substance and Sexual Activity  . Alcohol use: No  . Drug use: No  . Sexual activity: Not on file  Lifestyle  . Physical activity:    Days per week: Not on file    Minutes per session: Not on file  . Stress: Not on file  Relationships  . Social connections:    Talks on phone: Not on file    Gets together: Not on file    Attends religious service: Not on file    Active member of club or organization: Not on file    Attends meetings of clubs or organizations: Not on file    Relationship status: Not on file  . Intimate partner violence:    Fear of current or ex partner: Not on file    Emotionally abused: Not on file  Physically abused: Not on file    Forced sexual activity: Not on file  Other Topics Concern  . Not on file  Social History Narrative   Patient lives at home with her husband Marcello Moores).    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 Hydrocodone Other (See Comments)    Heart pain  Delusions  . Tramadol Other (See Comments)    Hallucinations, abd pain Delusions (intolerance)    Pertinent  Health Maintenance Due  Topic Date Due  . MAMMOGRAM  04/26/2018 (Originally 01/22/2018)  . PNA vac Low Risk Adult (1 of 2 - PCV13) 04/26/2018 (Originally 01/28/2011)  . COLONOSCOPY  07/02/2023  . DEXA SCAN  Completed    Medications: Outpatient Encounter Medications as of 04/14/2018  Medication Sig  . acetaminophen (TYLENOL) 500 MG tablet Take 1,000 mg by mouth 3 (three) times daily as needed for moderate pain or headache.   Marland Kitchen amLODipine (NORVASC) 5 MG tablet Take 5 mg by mouth every morning.   Marland Kitchen aspirin EC 325 MG EC tablet Take 1 tablet (325 mg total) by mouth 2 (two) times daily.  . bisacodyl (DULCOLAX) 10 MG suppository Place 10 mg rectally daily as needed for moderate constipation.  . Cyanocobalamin (VITAMIN B-12 PO) Take 2.5 mLs by mouth daily.  Marland Kitchen levothyroxine (SYNTHROID,  LEVOTHROID) 100 MCG tablet Take 100 mcg by mouth daily before breakfast.  . Melatonin 3 MG CAPS Take 3 mg by mouth at bedtime as needed (for sleep).   . methocarbamol (ROBAXIN) 500 MG tablet Take 500 mg by mouth every 6 (six) hours as needed for muscle spasms.  Marland Kitchen oxyCODONE (OXY IR/ROXICODONE) 5 MG immediate release tablet Take 1-2 tablets (5-10 mg total) by mouth every 6 (six) hours as needed for moderate pain (pain score 4-6).  . pantoprazole (PROTONIX) 40 MG tablet Take 1 tablet (40 mg total) by mouth daily before breakfast.  . polyethylene glycol powder (GLYCOLAX/MIRALAX) powder Take 17 g by mouth daily as needed for mild constipation or moderate constipation.   No facility-administered encounter medications on file as of 04/14/2018.      Review of Systems   General she is not complaining of any fever or chills.  Skin does not complain of rashes or itching or diaphoresis.  Head ears eyes nose mouth and throat is not complain of visual changes or sore throat she has prescription lenses.  Respiratory denies shortness of breath or cough.  Cardiac denies chest pain when I can see she does not have significant edema she does have a hard cast on the right which makes full assessment difficult.  GI is not complaining of abdominal pain nausea vomiting diarrhea constipation says she is having regular bowel movements.  GU is not complaining of dysuria.  Musculoskeletal continues to have some right knee discomfort but oxycodone apparently is helping.  Neurologic does not complain of dizziness headache or numbness.--Has intermittent tremors  And psych does not complain of being depressed or anxious she has complained of insomnia -- is on melatonin  Vitals:   04/14/18 1459  BP: (!) 148/76  Pulse: 68  Resp: 19  Temp: (!) 97.1 F (36.2 C)  TempSrc: Oral  Earlier blood pressure was 118/62 Physical Exam   General this a pleasant elderly female in no distress sitting comfortably in her  wheelchair.  Skin is warm and dry.  Eyes she has prescription lenses sclera and conjunctive are  clear visual acuity appears to be intact.  Oropharynx clear mucous membranes moist.  Chest is clear to auscultation there is no labored breathing.  Heart is regular rate and rhythm without murmur gallop or rub from what I can ascertain she does not have significant lower extremity edema.  Abdomen soft nontender with positive bowel sounds.  Musculoskeletal does have hard cast present on right lower extremity- otherwise able to move her extremities it appears at baseline with baseline strength.  Neurologic is grossly intact her speech is clear no lateralizing findings touch sensation is intact lower extremities bilaterally. Could not really appreciate a tremor today--per patient this is intermittent Continues with nuchal rigidity upper extremities bilaterally  Psych she is alert and oriented pleasant and appropriate  Labs reviewed: Basic Metabolic Panel: Recent Labs    03/24/18 0510 03/27/18 0700 04/06/18 0700  NA 139 139 139  K 4.4 4.0 3.9  CL 106 104 105  CO2 24 29 26   GLUCOSE 105* 102* 80  BUN 10 6* 10  CREATININE 0.60 0.56 0.68  CALCIUM 9.0 9.0 9.4   Liver Function Tests: Recent Labs    10/10/17 2357 01/05/18 1345 02/23/18 1539 03/10/18 1036 03/23/18 1101  AST 15 18 18 19 19   ALT 12* 11 12 15 14   ALKPHOS 111 101  --   --  109  BILITOT 0.4 0.5 0.3 0.3 0.7  PROT 7.3 8.1 7.1 7.2 7.8  ALBUMIN 3.5 3.7  --   --  4.4   Recent Labs    10/10/17 2357  LIPASE 25   No results for input(s): AMMONIA in the last 8760 hours. CBC: Recent Labs    02/23/18 1539 03/23/18 1101  03/25/18 0501 03/27/18 0700 04/06/18 0700  WBC 3.6* 3.1*   < > 6.6 5.1 3.8*  NEUTROABS 2,387 2.0  --   --  3.8  --   HGB 11.4* 12.8   < > 9.3* 10.0* 11.0*  HCT 35.5 39.9   < > 30.4* 32.6* 36.6  MCV 84.9 86.9   < > 88.4 88.1 90.6  PLT 212 275   < > 198 175 345   < > = values in this interval not  displayed.   Cardiac Enzymes: Recent Labs    10/10/17 2138 10/10/17 2357 10/11/17 1236  TROPONINI <0.03 <0.03 <0.03   BNP: Invalid input(s): POCBNP CBG: No results for input(s): GLUCAP in the last 8760 hours.  Procedures and Imaging Studies During Stay: No results found.  Assessment/Plan:    #1-history of right knee revision-she is completed antibiotics-continues on aspirin twice daily for DVT prophylaxis will have orthopedic follow-up-as oxycodone as needed for pain as well as Robaxin she feels she is doing well with therapy will need continued therapy when she goes home.  2.  Hypertension appears to have some variable systolics but I do not see consistent elevations at this point continue Norvasc with follow-up by primary care provider.  3.  History of postop anemia this appears to be rebounding with hemoglobin now up to 11 will need follow-up by primary care provider but this is trending up.  4.-History of Parkinson's disease-she has been followed by neurology in the past as noted above but has not follow-up on appointment secondary to her recent knee issues-she would prefer to see a neurologist in the [place] area- this is attempting to be arranged but will need follow-up as an outpatient.  She states her tremors are intermittent.  No longer is taking her Sinemet or Mirapex.  5.-  Hypothyroidism this appears stable on Synthroid with recent TSH earlier this month within normal range.  6.-  History of GERD this is been stable and relatively asymptomatic during her stay here she is on Protonix.  7.  History of B12 deficiency on supplementation and recent labs showed adequate stores.  8.  History of insomnia she has been started melatonin- will warrant follow-up by primary care provider she says at times she still does have insomnia but would be hesitant to start another medication right before discharge.  Again she will be going home she will need continued PT and OT- she  does have a supportive husband- will need follow-up by orthopedics as well as primary care provider as well as home health support  [phone] note greater than 30 minutes spent on this discharge summary- greater than 50% of time spent coordinating a plan of care for numerous diagnoses :

## 2018-06-16 ENCOUNTER — Encounter (INDEPENDENT_AMBULATORY_CARE_PROVIDER_SITE_OTHER): Payer: Self-pay | Admitting: *Deleted

## 2018-10-07 ENCOUNTER — Ambulatory Visit: Payer: Medicare Other | Admitting: Cardiology

## 2018-10-13 ENCOUNTER — Encounter: Payer: Self-pay | Admitting: *Deleted

## 2018-12-30 ENCOUNTER — Telehealth: Payer: Self-pay | Admitting: Cardiology

## 2018-12-30 NOTE — Telephone Encounter (Signed)
Virtual Visit Pre-Appointment Phone Call  "(Name), I am calling you today to discuss your upcoming appointment. We are currently trying to limit exposure to the virus that causes COVID-19 by seeing patients at home rather than in the office."  "What is the BEST phone number to call the day of the visit?" - [phone]  1. Do you have or have access to (through a family member/friend) a smartphone with video capability that we can use for your visit?" a. If yes - list this number in appt notes as cell (if different from BEST phone #) and list the appointment type as a VIDEO visit in appointment notes b. If no - list the appointment type as a PHONE visit in appointment notes  2. Confirm consent - "In the setting of the current Covid19 crisis, you are scheduled for a (phone or video) visit with your provider on (date) at (time).  Just as we do with many in-office visits, in order for you to participate in this visit, we must obtain consent.  If you'd like, I can send this to your mychart (if signed up) or email for you to review.  Otherwise, I can obtain your verbal consent now.  All virtual visits are billed to your insurance company just like a normal visit would be.  By agreeing to a virtual visit, we'd like you to understand that the technology does not allow for your provider to perform an examination, and thus may limit your provider's ability to fully assess your condition. If your provider identifies any concerns that need to be evaluated in person, we will make arrangements to do so.  Finally, though the technology is pretty good, we cannot assure that it will always work on either your or our end, and in the setting of a video visit, we may have to convert it to a phone-only visit.  In either situation, we cannot ensure that we have a secure connection.  Are you willing to proceed?" STAFF: Did the patient verbally acknowledge consent to telehealth visit? Document YES/NO here:  YES   3. Advise patient to be prepared - "Two hours prior to your appointment, go ahead and check your blood pressure, pulse, oxygen saturation, and your weight (if you have the equipment to check those) and write them all down. When your visit starts, your provider will ask you for this information. If you have an Apple Watch or Kardia device, please plan to have heart rate information ready on the day of your appointment. Please have a pen and paper handy nearby the day of the visit as well."  4. Give patient instructions for MyChart download to smartphone OR [email or web address] as below if video visit (depending on what platform provider is using)  5. Inform patient they will receive a phone call 15 minutes prior to their appointment time (may be from unknown caller ID) so they should be prepared to answer    TELEPHONE CALL NOTE  Lisa Torres has been deemed a candidate for a follow-up tele-health visit to limit community exposure during the Covid-19 pandemic. I spoke with the patient via phone to ensure availability of phone/video source, confirm preferred email & phone number, and discuss instructions and expectations.  I reminded Lisa Torres to be prepared with any vital sign and/or heart rhythm information that could potentially be obtained via home monitoring, at the time of her visit. I reminded Lisa Torres to expect a phone call prior to her visit.  [name] 12/30/2018 3:21 PM   INSTRUCTIONS FOR DOWNLOADING THE MYCHART APP TO SMARTPHONE  - The patient must first make sure to have activated MyChart and know their login information - If Apple, go to [employer] and type in MyChart in the search bar and download the app. If Android, ask patient to go to [employer] and type in [place] in the search bar and download the app. The app is free but as with any other app downloads, their phone may require them to verify saved payment information or Apple/Android  password.  - The patient will need to then log into the app with their MyChart username and password, and select [facility] as their healthcare provider to link the account. When it is time for your visit, go to the MyChart app, find appointments, and click Begin Video Visit. Be sure to Select Allow for your device to access the Microphone and Camera for your visit. You will then be connected, and your provider will be with you shortly.  **If they have any issues connecting, or need assistance please contact MyChart service desk [phone])**  **If using a computer, in order to ensure the best quality for their visit they will need to use either of the following Internet Browsers: [employer], or Google Chrome**  IF USING DOXIMITY or DOXY.ME - The patient will receive a link just prior to their visit by text.     FULL LENGTH CONSENT FOR TELE-HEALTH VISIT   I hereby voluntarily request, consent and authorize [facility] and its employed or contracted physicians, physician assistants, nurse practitioners or other licensed health care professionals (the Practitioner), to provide me with telemedicine health care services (the Services") as deemed necessary by the treating Practitioner. I acknowledge and consent to receive the Services by the Practitioner via telemedicine. I understand that the telemedicine visit will involve communicating with the Practitioner through live audiovisual communication technology and the disclosure of certain medical information by electronic transmission. I acknowledge that I have been given the opportunity to request an in-person assessment or other available alternative prior to the telemedicine visit and am voluntarily participating in the telemedicine visit.  I understand that I have the right to withhold or withdraw my consent to the use of telemedicine in the course of my care at any time, without affecting my right to future care or treatment,  and that the Practitioner or I may terminate the telemedicine visit at any time. I understand that I have the right to inspect all information obtained and/or recorded in the course of the telemedicine visit and may receive copies of available information for a reasonable fee.  I understand that some of the potential risks of receiving the Services via telemedicine include:   Delay or interruption in medical evaluation due to technological equipment failure or disruption;  Information transmitted may not be sufficient (e.g. poor resolution of images) to allow for appropriate medical decision making by the Practitioner; and/or   In rare instances, security protocols could fail, causing a breach of personal health information.  Furthermore, I acknowledge that it is my responsibility to provide information about my medical history, conditions and care that is complete and accurate to the best of my ability. I acknowledge that Practitioner's advice, recommendations, and/or decision may be based on factors not within their control, such as incomplete or inaccurate data provided by me or distortions of diagnostic images or specimens that may result from electronic transmissions. I understand that the practice  of medicine is not an Chief Strategy Officer and that Practitioner makes no warranties or guarantees regarding treatment outcomes. I acknowledge that I will receive a copy of this consent concurrently upon execution via email to the email address I last provided but may also request a printed copy by calling the office of [facility].    I understand that my insurance will be billed for this visit.   I have read or had this consent read to me.  I understand the contents of this consent, which adequately explains the benefits and risks of the Services being provided via telemedicine.   I have been provided ample opportunity to ask questions regarding this consent and the Services and have had my questions  answered to my satisfaction.  I give my informed consent for the services to be provided through the use of telemedicine in my medical care  By participating in this telemedicine visit I agree to the above.

## 2019-01-11 ENCOUNTER — Ambulatory Visit (INDEPENDENT_AMBULATORY_CARE_PROVIDER_SITE_OTHER): Payer: Medicare Other | Admitting: Internal Medicine

## 2019-01-20 ENCOUNTER — Telehealth (INDEPENDENT_AMBULATORY_CARE_PROVIDER_SITE_OTHER): Payer: Medicare Other | Admitting: Cardiology

## 2019-01-20 ENCOUNTER — Encounter: Payer: Self-pay | Admitting: Cardiology

## 2019-01-20 VITALS — Temp 98.7°F | Ht 61.5 in | Wt 150.0 lb

## 2019-01-20 DIAGNOSIS — R0789 Other chest pain: Secondary | ICD-10-CM | POA: Diagnosis not present

## 2019-01-20 DIAGNOSIS — I10 Essential (primary) hypertension: Secondary | ICD-10-CM

## 2019-01-20 NOTE — Progress Notes (Signed)
Virtual Visit via Telephone Note   This visit type was conducted due to national recommendations for restrictions regarding the COVID-19 Pandemic (e.g. social distancing) in an effort to limit this patient's exposure and mitigate transmission in our community.  Due to her co-morbid illnesses, this patient is at least at moderate risk for complications without adequate follow up.  This format is felt to be most appropriate for this patient at this time.  The patient did not have access to video technology/had technical difficulties with video requiring transitioning to audio format only (telephone).  All issues noted in this document were discussed and addressed.  No physical exam could be performed with this format.  Please refer to the patient's chart for her  consent to telehealth for [facility].   Date:  01/20/2019   ID:  Lisa Torres, Lisa Torres 1946/05/28, MRN [id number]  Patient Location: Home Provider Location: Home  PCP:  [name], MD  Cardiologist:  Carlyle Dolly, MD  Electrophysiologist:  None   Evaluation Performed:  Follow-Up Visit  Chief Complaint:  Chest pain  History of Present Illness:    Lisa Torres is a 73 y.o. female seen for follow up of the following medical problems.    1. History of atypical chest pain - admit 12/2016 with atypical chest pain. Negative lexiscan at that time  - seen in ER 09/2017 with chest pain on 2 separate days back to back - atypical symptoms for cardiac etiology. Tender to palpation.  - N/V - trop neg x 2, CXR no acute process, EKG SR without ischemic changes.   - pressure like pain, midchest/epigastric. 7/10 in severity. Feels hot. No SOB. Some nausea with vomiting.  - lasts 5-10 minutes, reoccurs. Can occur at rest or with activity.  - episdoe on Friday occurred after eating dinner. Mild burning feeling in throat. - areas tender to palpation in epigastric area. Worst with position.  - pain improved with  antacids.  - had EGD 11/2017 overall benign - no recent symptoms.   2. HTN - compliant with meds   3. Gait instability - followed by PT, issues since knee replacement complicated by infectoins and recurrent infections.    The patient does not have symptoms concerning for COVID-19 infection (fever, chills, cough, or new shortness of breath).    Past Medical History:  Diagnosis Date  . Breast cancer ([place]) 11/2011  . Chronic back pain   . Chronic leg pain    bilateral  . Chronic leukopenia   . DDD (degenerative disc disease), cervical   . DDD (degenerative disc disease), lumbar   . Gait abnormality   . GERD (gastroesophageal reflux disease)   . Hypertension   . Parkinson's disease ([place])   . Radiation 04/2012   33 treatments for breast cancer  . Right knee pain   . Thyroid disease    Past Surgical History:  Procedure Laterality Date  . ABDOMINAL HYSTERECTOMY     partial  . BREAST LUMPECTOMY Left 12/2011  . CHOLECYSTECTOMY    . COLONOSCOPY N/A 07/01/2013   Procedure: COLONOSCOPY;  Surgeon: Rogene Houston, MD;  [facility];  Service: Endoscopy;  Laterality: N/A;  1030  . ESOPHAGOGASTRODUODENOSCOPY N/A 11/21/2017   Procedure: ESOPHAGOGASTRODUODENOSCOPY (EGD);  Surgeon: Rogene Houston, MD;  [facility];  Service: Endoscopy;  Laterality: N/A;  2:45  . EXCISIONAL TOTAL KNEE ARTHROPLASTY WITH ANTIBIOTIC SPACERS Right 01/12/2018   Procedure: EXCISIONAL TOTAL KNEE ARTHROPLASTY WITH ANTIBIOTIC SPACERS;  Surgeon:  Vickey Huger, MD;  Location: [place];  Service: Orthopedics;  Laterality: Right;  . JOINT REPLACEMENT  01/2017   right knee  . KNEE SURGERY Right 01/12/2018   EXCISIONAL TOTAL KNEE ARTHROPLASTY WITH ANTIBIOTIC SPACERS  . TOTAL KNEE REVISION Right 03/23/2018   Procedure: RIGHT TOTAL KNEE REVISION;  Surgeon: Vickey Huger, MD;  [facility];  Service: Orthopedics;  Laterality: Right;  . TUBAL LIGATION       No outpatient medications have been  marked as taking for the 01/20/19 encounter (Appointment) with Arnoldo Lenis, MD.     Allergies:   Hydrocodone and Tramadol   Social History   Tobacco Use  . Smoking status: Former Smoker    Packs/day: 0.50    Years: 18.00    Pack years: 9.00    Types: Cigarettes    Quit date: 09/30/1998    Years since quitting: 20.3  . Smokeless tobacco: Never Used  Substance Use Topics  . Alcohol use: No  . Drug use: No     Family Hx: The patient's family history includes Cancer (age of onset: 31) in her mother; Cancer (age of onset: 74) in her father; Pancreatic cancer in her brother.  ROS:   Please see the history of present illness.     All other systems reviewed and are negative.   Prior CV studies:   The following studies were reviewed today:   Labs/Other Tests and Data Reviewed:    EKG:  No ECG reviewed.  Recent Labs: 03/23/2018: ALT 14 03/27/2018: TSH 3.248 04/06/2018: BUN 10; Creatinine, Ser 0.68; Hemoglobin 11.0; Platelets 345; Potassium 3.9; Sodium 139   Recent Lipid Panel No results found for: CHOL, TRIG, HDL, CHOLHDL, LDLCALC, LDLDIRECT  Wt Readings from Last 3 Encounters:  03/23/18 140 lb (63.5 kg)  03/04/18 135 lb (61.2 kg)  01/13/18 150 lb (68 kg)     Objective:    Vital Signs:   Today's Vitals   01/20/19 0934  Temp: 98.7 F (37.1 C)  Weight: 150 lb (68 kg)  Height: 5' 1.5" (1.562 m)   Body mass index is 27.88 kg/m.  Normal affect. Normal speech pattern and tone. Comfortable no apparent distress. No audible signs of SOb or wheezing.   ASSESSMENT & PLAN:     1. Chest pain - prior symptoms not consistent with cardiac etiology. Negative lexiscan 12/2016.  - no recent symptoms, no further workup at this time.   2. HTN -continue current meds   May follow up just as neeed  COVID-19 Education: The signs and symptoms of COVID-19 were discussed with the patient and how to seek care for testing (follow up with PCP or arrange E-visit).  The  importance of social distancing was discussed today.  Time:   Today, I have spent 15 minutes with the patient with telehealth technology discussing the above problems.     Medication Adjustments/Labs and Tests Ordered: Current medicines are reviewed at length with the patient today.  Concerns regarding medicines are outlined above.   Tests Ordered: No orders of the defined types were placed in this encounter.   Medication Changes: No orders of the defined types were placed in this encounter.   Follow Up:  As needed  Signed, Carlyle Dolly, MD  01/20/2019 8:51 AM    [facility]

## 2019-01-20 NOTE — Patient Instructions (Signed)
Medication Instructions:  Your physician recommends that you continue on your current medications as directed. Please refer to the Current Medication list given to you today.   Labwork: NONE  Testing/Procedures: NONE  Follow-Up: Your physician recommends that you schedule a follow-up appointment in: AS NEEDED      Any Other Special Instructions Will Be Listed Below (If Applicable).     If you need a refill on your cardiac medications before your next appointment, please call your pharmacy.   

## 2019-04-06 ENCOUNTER — Other Ambulatory Visit (INDEPENDENT_AMBULATORY_CARE_PROVIDER_SITE_OTHER): Payer: Self-pay | Admitting: Internal Medicine

## 2019-04-08 NOTE — Telephone Encounter (Signed)
Patient will need to have OV prior to further refills or she may get from her PCP. 

## 2019-04-12 ENCOUNTER — Other Ambulatory Visit (INDEPENDENT_AMBULATORY_CARE_PROVIDER_SITE_OTHER): Payer: Self-pay | Admitting: Internal Medicine

## 2019-04-13 ENCOUNTER — Telehealth (INDEPENDENT_AMBULATORY_CARE_PROVIDER_SITE_OTHER): Payer: Self-pay | Admitting: Internal Medicine

## 2019-04-13 NOTE — Telephone Encounter (Signed)
Patient called regarding medication refills  -  Ph# [phone]

## 2019-04-14 NOTE — Telephone Encounter (Signed)
Talked with the patient , she picked up the Pantoprazole today. It had been sent in to her pharmacy  On 04/12/2019.

## 2019-05-07 ENCOUNTER — Other Ambulatory Visit: Payer: Self-pay | Admitting: Family Medicine

## 2019-05-07 DIAGNOSIS — Z9889 Other specified postprocedural states: Secondary | ICD-10-CM

## 2019-05-19 ENCOUNTER — Ambulatory Visit
Admission: RE | Admit: 2019-05-19 | Discharge: 2019-05-19 | Disposition: A | Discharge status: Home / Self Care | Payer: Medicare Other | Source: Ambulatory Visit | Attending: Family Medicine | Admitting: Family Medicine

## 2019-05-19 ENCOUNTER — Other Ambulatory Visit: Payer: Self-pay

## 2019-05-19 ENCOUNTER — Ambulatory Visit: Payer: Medicare Other

## 2019-05-19 DIAGNOSIS — Z9889 Other specified postprocedural states: Secondary | ICD-10-CM

## 2019-05-26 ENCOUNTER — Other Ambulatory Visit (HOSPITAL_COMMUNITY): Payer: Self-pay | Admitting: Family Medicine

## 2019-05-26 DIAGNOSIS — M25561 Pain in right knee: Secondary | ICD-10-CM

## 2019-05-26 DIAGNOSIS — I739 Peripheral vascular disease, unspecified: Secondary | ICD-10-CM

## 2019-05-26 DIAGNOSIS — M25562 Pain in left knee: Secondary | ICD-10-CM

## 2019-05-31 ENCOUNTER — Ambulatory Visit (HOSPITAL_COMMUNITY): Admission: RE | Admit: 2019-05-31 | Payer: Medicare Other | Source: Ambulatory Visit

## 2019-06-01 ENCOUNTER — Other Ambulatory Visit: Payer: Self-pay

## 2019-06-01 ENCOUNTER — Ambulatory Visit (HOSPITAL_COMMUNITY)
Admission: RE | Admit: 2019-06-01 | Discharge: 2019-06-01 | Disposition: A | Discharge status: Home / Self Care | Payer: Medicare Other | Source: Ambulatory Visit | Attending: Family Medicine | Admitting: Family Medicine

## 2019-06-01 DIAGNOSIS — M25562 Pain in left knee: Secondary | ICD-10-CM | POA: Insufficient documentation

## 2019-06-01 DIAGNOSIS — I739 Peripheral vascular disease, unspecified: Secondary | ICD-10-CM

## 2019-06-01 DIAGNOSIS — M25561 Pain in right knee: Secondary | ICD-10-CM | POA: Insufficient documentation

## 2019-07-07 DIAGNOSIS — D171 Benign lipomatous neoplasm of skin and subcutaneous tissue of trunk: Secondary | ICD-10-CM | POA: Diagnosis not present

## 2019-07-29 ENCOUNTER — Other Ambulatory Visit: Payer: Self-pay

## 2019-07-29 ENCOUNTER — Ambulatory Visit (INDEPENDENT_AMBULATORY_CARE_PROVIDER_SITE_OTHER): Payer: Medicare Other | Admitting: Gastroenterology

## 2019-07-29 ENCOUNTER — Encounter (INDEPENDENT_AMBULATORY_CARE_PROVIDER_SITE_OTHER): Payer: Self-pay | Admitting: Gastroenterology

## 2019-07-29 VITALS — BP 142/82 | HR 66 | Temp 97.2°F | Ht 61.5 in

## 2019-07-29 DIAGNOSIS — Z800 Family history of malignant neoplasm of digestive organs: Secondary | ICD-10-CM | POA: Diagnosis not present

## 2019-07-29 DIAGNOSIS — K219 Gastro-esophageal reflux disease without esophagitis: Secondary | ICD-10-CM

## 2019-07-29 DIAGNOSIS — K5909 Other constipation: Secondary | ICD-10-CM | POA: Diagnosis not present

## 2019-07-29 MED ORDER — POLYETHYLENE GLYCOL 3350 17 GM/SCOOP PO POWD: 17.0000 g | Freq: Every day | ORAL | 1 refills | Status: DC

## 2019-07-29 MED ORDER — PANTOPRAZOLE SODIUM 40 MG PO TBEC: 40.0000 mg | DELAYED_RELEASE_TABLET | Freq: Every day | ORAL | 3 refills | Status: DC

## 2019-07-29 NOTE — Patient Instructions (Addendum)
Try miralax 1/2 dose daily instead of as needed to see if this will help regularity, need to be moving stools well prior to colonoscopy prep   -Continue protonix    -We will call to schedule colonoscopy   Make sure to take aspirin 325mg  with food - will need to hold prior to colonoscopy

## 2019-07-29 NOTE — Progress Notes (Signed)
Patient profile: Lisa Torres is a 74 y.o. female seen for f/up. Last seen in clinic 12/2017  History of Present Illness: Lisa Torres is seen today for follow-up.  She reports her Protonix overall is doing well.  She takes this in the morning before breakfast with good control of reflux symptoms.  She denies any dysphagia nausea or vomiting.  Occasionally has bloating with eating but is unsure of any particular trigger foods and does not feel this happens often.  She does have some constipation, she is currently using MiraLAX 1-2 times a week after no bowel movement for several days.  Does have some issues with urgency with MiraLAX which is problematic given her mobility issues.  She denies any rectal bleeding or melena.  No lower abdominal pain.  Her weight is up as below.  She takes aspirin 325mg  on empty stomach-we discussed taking with food today.  She denies tobacco or alcohol.  Wt Readings from Last 3 Encounters:  01/20/19 150 lb (68 kg)  03/23/18 140 lb (63.5 kg)  03/04/18 135 lb (61.2 kg)     Last Colonoscopy: 2015-Examination performed to cecum. Single small polyp ablated via cold biopsy from hepatic flexure. Small external hemorrhoids. Last Endoscopy: 2019-Normal esophagus. - Z-line irregular, 38 cm from the incisors. - Three dimunitive gastric polyps. These were left alone. - Normal duodenal bulb and second portion of the duodenum.  - No specimens collected.   Past Medical History:  Past Medical History:  Diagnosis Date  . Breast cancer ([place]) 11/2011  . Chronic back pain   . Chronic leg pain    bilateral  . Chronic leukopenia   . DDD (degenerative disc disease), cervical   . DDD (degenerative disc disease), lumbar   . Gait abnormality   . GERD (gastroesophageal reflux disease)   . Hypertension   . Parkinson's disease ([place])   . Radiation 04/2012   33 treatments for breast cancer  . Right knee pain   . Thyroid disease     Problem  List: Patient Active Problem List   Diagnosis Date Noted  . S/P total knee replacement 03/23/2018  . Medication monitoring encounter 02/05/2018  . PICC (peripherally inserted central catheter) in place 02/05/2018  . Prosthetic joint infection ([place]) 02/04/2018  . 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 Abdominal pain, epigastric 11/04/2017  . Breast cancer, left breast ([place]) 11/02/2015  . Difficulty walking 10/20/2013  . Parkinson disease ([place]) 08/24/2013  . Hypothyroid 05/05/2013  . Hypertension 05/05/2013    Past Surgical History: Past Surgical History:  Procedure Laterality Date  . ABDOMINAL HYSTERECTOMY     partial  . BREAST LUMPECTOMY Left 12/2011  . CHOLECYSTECTOMY    . COLONOSCOPY N/A 07/01/2013   Procedure: COLONOSCOPY;  Surgeon: Rogene Houston, MD;  [facility];  Service: Endoscopy;  Laterality: N/A;  1030  . ESOPHAGOGASTRODUODENOSCOPY N/A 11/21/2017   Procedure: ESOPHAGOGASTRODUODENOSCOPY (EGD);  Surgeon: Rogene Houston, MD;  [facility];  Service: Endoscopy;  Laterality: N/A;  2:45  . EXCISIONAL TOTAL KNEE ARTHROPLASTY WITH ANTIBIOTIC SPACERS Right 01/12/2018   Procedure: EXCISIONAL TOTAL KNEE ARTHROPLASTY WITH ANTIBIOTIC SPACERS;  Surgeon: Vickey Huger, MD;  Location: [place];  Service: Orthopedics;  Laterality: Right;  . JOINT REPLACEMENT  01/2017   right knee  . KNEE SURGERY Right 01/12/2018   EXCISIONAL TOTAL KNEE ARTHROPLASTY WITH ANTIBIOTIC SPACERS  . TOTAL KNEE REVISION Right 03/23/2018   Procedure: RIGHT TOTAL KNEE REVISION;  Surgeon: Vickey Huger, MD;  [facility];  Service: Orthopedics;  Laterality: Right;  . TUBAL LIGATION      Allergies: Allergies  Allergen Reactions  . Hydrocodone Other (See Comments)    Heart pain  Delusions  . Tramadol Other (See Comments)    Hallucinations, abd pain Delusions (intolerance)      Home Medications:  Current Outpatient Medications:  .  amLODipine (NORVASC) 5 MG tablet, Take 5 mg by  mouth every morning. , Disp: , Rfl:  .  aspirin EC 325 MG EC tablet, Take 1 tablet (325 mg total) by mouth 2 (two) times daily., Disp: 30 tablet, Rfl: 0 .  baclofen (LIORESAL) 10 MG tablet, TAKE 1 TABLET BY MOUTH EVERYDAY AT BEDTIME, Disp: , Rfl:  .  carbidopa-levodopa (SINEMET IR) 25-100 MG tablet, Take 2 tablets by mouth 3 (three) times daily., Disp: , Rfl:  .  Cyanocobalamin (VITAMIN B-12 PO), Take 2.5 mLs by mouth daily., Disp: , Rfl:  .  diazepam (VALIUM) 5 MG tablet, Take 5 mg by mouth 2 (two) times daily., Disp: , Rfl:  .  levothyroxine (SYNTHROID, LEVOTHROID) 100 MCG tablet, Take 100 mcg by mouth daily before breakfast., Disp: , Rfl:  .  pantoprazole (PROTONIX) 40 MG tablet, Take 1 tablet (40 mg total) by mouth daily., Disp: 90 tablet, Rfl: 3 .  traZODone (DESYREL) 50 MG tablet, TAKE 2 TABLETS BY MOUTH EVERY DAY AT BEDTIME, Disp: , Rfl:  .  polyethylene glycol powder (MIRALAX) 17 GM/SCOOP powder, Take 17 g by mouth daily., Disp: 850 g, Rfl: 1   Family History: family history includes Cancer (age of onset: 59) in her mother; Cancer (age of onset: 64) in her father; Pancreatic cancer in her brother.    Social History:   reports that she quit smoking about 20 years ago. Her smoking use included cigarettes. She has a 9.00 pack-year smoking history. She has never used smokeless tobacco. She reports that she does not drink alcohol or use drugs.   Review of Systems: Constitutional: Denies weight loss/weight gain  Eyes: No changes in vision. ENT: No oral lesions, sore throat.  GI: see HPI.  Heme/Lymph: No easy bruising.  CV: No chest pain.  GU: No hematuria.  Integumentary: No rashes.  Neuro: No headaches.  Psych: No depression/anxiety.  Endocrine: No heat/cold intolerance.  Allergic/Immunologic: No urticaria.  Resp: No cough, SOB.  Musculoskeletal: No joint swelling.    Physical Examination: BP (!) 142/82 (BP Location: Right Arm, Patient Position: Sitting, Cuff Size: Large)    Pulse 66   Temp (!) 97.2 F (36.2 C) (Temporal)   Ht 5' 1.5" (1.562 m)   BMI 27.88 kg/m  Gen: NAD, alert and oriented x 4. In wheelchair.  HEENT: PEERLA, EOMI, Neck: supple, no JVD Chest: CTA bilaterally, no wheezes, crackles, or other adventitious sounds CV: RRR, no m/g/c/r Abd: soft, NT, ND, +BS in all four quadrants; no HSM, guarding, ridigity, or rebound tenderness Ext: no edema, well perfused with 2+ pulses, Skin: no rash or lesions noted on observed skin Lymph: no noted LAD  Data Reviewed:   Requesting her November 2020 labs from her primary Dr. Quintin Alto.  Assessment/Plan: Ms. Wenzlick is a 74 y.o. female   1.  GERD-she is well controlled on Protonix 40 mg once a day.  No upper GI alarm symptoms.  Will continue current dose.  Diet modifications reviewed.  2.  Family history of colon cancer-last colonoscopy 2015.  She is due at this time.  Did review she will need to hold  her aspirin 325 mg 2 days prior.  She denies any issues with sedation  Patient denies CP, SOB, and use of blood thinners. I discussed the risks and benefits of procedure including bleeding, perforation, infection, missed lesions, medication reactions and possible hospitalization or surgery if complications. All questions answered.   3.  Chronic constipation-she is using MiraLAX as a rescue in larger amounts which can cause urgency.  We discussed a small daily dose check titrated for soft stool.  Did review for for her to be moving stools well prior to colonoscopy to decrease risk for prep  Maya was seen today for follow-up.  Diagnoses and all orders for this visit:  Family history of colon cancer  Chronic GERD  Chronic constipation  Other orders -     polyethylene glycol powder (MIRALAX) 17 GM/SCOOP powder; Take 17 g by mouth daily. -     pantoprazole (PROTONIX) 40 MG tablet; Take 1 tablet (40 mg total) by mouth daily.     Recommendations: f/up 1 year or sooner if needed after colonoscopy     I personally performed the service, non-incident to. (WP)  Laurine Blazer, [facility]

## 2019-08-02 ENCOUNTER — Other Ambulatory Visit (INDEPENDENT_AMBULATORY_CARE_PROVIDER_SITE_OTHER): Payer: Self-pay | Admitting: *Deleted

## 2019-08-02 ENCOUNTER — Encounter (INDEPENDENT_AMBULATORY_CARE_PROVIDER_SITE_OTHER): Payer: Self-pay | Admitting: *Deleted

## 2019-08-02 ENCOUNTER — Telehealth (INDEPENDENT_AMBULATORY_CARE_PROVIDER_SITE_OTHER): Payer: Self-pay | Admitting: *Deleted

## 2019-08-02 DIAGNOSIS — Z800 Family history of malignant neoplasm of digestive organs: Secondary | ICD-10-CM

## 2019-08-02 DIAGNOSIS — Z86010 Personal history of colonic polyps: Secondary | ICD-10-CM

## 2019-08-02 NOTE — Telephone Encounter (Signed)
Patient needs plenvu (copay card) TCS sch'd 3/31

## 2019-08-03 MED ORDER — PLENVU 140 G PO SOLR: 1.0000 | Freq: Once | ORAL | 0 refills | Status: AC

## 2019-09-13 ENCOUNTER — Other Ambulatory Visit (HOSPITAL_COMMUNITY)
Admission: RE | Admit: 2019-09-13 | Discharge: 2019-09-13 | Disposition: A | Discharge status: Home / Self Care | Payer: Medicare Other | Source: Ambulatory Visit | Attending: Internal Medicine | Admitting: Internal Medicine

## 2019-09-13 ENCOUNTER — Other Ambulatory Visit: Payer: Self-pay

## 2019-09-13 DIAGNOSIS — Z20822 Contact with and (suspected) exposure to covid-19: Secondary | ICD-10-CM | POA: Insufficient documentation

## 2019-09-13 DIAGNOSIS — Z01812 Encounter for preprocedural laboratory examination: Secondary | ICD-10-CM | POA: Diagnosis not present

## 2019-09-13 LAB — SARS CORONAVIRUS 2 (TAT 6-24 HRS): SARS Coronavirus 2: NEGATIVE

## 2019-09-15 ENCOUNTER — Other Ambulatory Visit: Payer: Self-pay

## 2019-09-15 ENCOUNTER — Ambulatory Visit (HOSPITAL_COMMUNITY)
Admission: RE | Admit: 2019-09-15 | Discharge: 2019-09-15 | Disposition: A | Discharge status: Home / Self Care | Payer: Medicare Other | Source: Ambulatory Visit | Attending: Internal Medicine | Admitting: Internal Medicine

## 2019-09-15 ENCOUNTER — Encounter (HOSPITAL_COMMUNITY)
Admission: RE | Disposition: A | Discharge status: Home / Self Care | Payer: Self-pay | Source: Ambulatory Visit | Attending: Internal Medicine

## 2019-09-15 ENCOUNTER — Encounter (HOSPITAL_COMMUNITY): Payer: Self-pay | Admitting: Internal Medicine

## 2019-09-15 DIAGNOSIS — E079 Disorder of thyroid, unspecified: Secondary | ICD-10-CM | POA: Diagnosis not present

## 2019-09-15 DIAGNOSIS — Z885 Allergy status to narcotic agent status: Secondary | ICD-10-CM | POA: Insufficient documentation

## 2019-09-15 DIAGNOSIS — Z79890 Hormone replacement therapy (postmenopausal): Secondary | ICD-10-CM | POA: Insufficient documentation

## 2019-09-15 DIAGNOSIS — Z808 Family history of malignant neoplasm of other organs or systems: Secondary | ICD-10-CM | POA: Diagnosis not present

## 2019-09-15 DIAGNOSIS — Z8719 Personal history of other diseases of the digestive system: Secondary | ICD-10-CM | POA: Diagnosis not present

## 2019-09-15 DIAGNOSIS — Z860100 Personal history of colon polyps, unspecified: Secondary | ICD-10-CM

## 2019-09-15 DIAGNOSIS — Z9049 Acquired absence of other specified parts of digestive tract: Secondary | ICD-10-CM | POA: Insufficient documentation

## 2019-09-15 DIAGNOSIS — Z90710 Acquired absence of both cervix and uterus: Secondary | ICD-10-CM | POA: Insufficient documentation

## 2019-09-15 DIAGNOSIS — Z79899 Other long term (current) drug therapy: Secondary | ICD-10-CM | POA: Diagnosis not present

## 2019-09-15 DIAGNOSIS — Z888 Allergy status to other drugs, medicaments and biological substances status: Secondary | ICD-10-CM | POA: Diagnosis not present

## 2019-09-15 DIAGNOSIS — K219 Gastro-esophageal reflux disease without esophagitis: Secondary | ICD-10-CM | POA: Diagnosis not present

## 2019-09-15 DIAGNOSIS — Z96651 Presence of right artificial knee joint: Secondary | ICD-10-CM | POA: Diagnosis not present

## 2019-09-15 DIAGNOSIS — Z7982 Long term (current) use of aspirin: Secondary | ICD-10-CM | POA: Diagnosis not present

## 2019-09-15 DIAGNOSIS — I10 Essential (primary) hypertension: Secondary | ICD-10-CM | POA: Diagnosis not present

## 2019-09-15 DIAGNOSIS — Z1211 Encounter for screening for malignant neoplasm of colon: Secondary | ICD-10-CM | POA: Insufficient documentation

## 2019-09-15 DIAGNOSIS — Z800 Family history of malignant neoplasm of digestive organs: Secondary | ICD-10-CM

## 2019-09-15 DIAGNOSIS — D123 Benign neoplasm of transverse colon: Secondary | ICD-10-CM | POA: Diagnosis not present

## 2019-09-15 DIAGNOSIS — K6289 Other specified diseases of anus and rectum: Secondary | ICD-10-CM | POA: Diagnosis not present

## 2019-09-15 DIAGNOSIS — Z87891 Personal history of nicotine dependence: Secondary | ICD-10-CM | POA: Insufficient documentation

## 2019-09-15 DIAGNOSIS — G20 Parkinson's disease: Secondary | ICD-10-CM | POA: Insufficient documentation

## 2019-09-15 DIAGNOSIS — Z09 Encounter for follow-up examination after completed treatment for conditions other than malignant neoplasm: Secondary | ICD-10-CM | POA: Diagnosis not present

## 2019-09-15 HISTORY — PX: COLONOSCOPY: SHX5424

## 2019-09-15 HISTORY — PX: POLYPECTOMY: SHX5525

## 2019-09-15 SURGERY — COLONOSCOPY
Anesthesia: Moderate Sedation

## 2019-09-15 MED ORDER — MIDAZOLAM HCL 5 MG/5ML IJ SOLN
INTRAMUSCULAR | Status: AC
  Filled 2019-09-15: qty 10

## 2019-09-15 MED ORDER — MIDAZOLAM HCL 5 MG/5ML IJ SOLN
INTRAMUSCULAR | Status: DC | PRN
  Administered 2019-09-15: 2 mg via INTRAVENOUS
  Administered 2019-09-15: 1 mg via INTRAVENOUS
  Administered 2019-09-15: 2 mg via INTRAVENOUS

## 2019-09-15 MED ORDER — SODIUM CHLORIDE 0.9 % IV SOLN
INTRAVENOUS | Status: DC
  Administered 2019-09-15: 1000 mL via INTRAVENOUS

## 2019-09-15 MED ORDER — STERILE WATER FOR IRRIGATION IR SOLN: Status: DC | PRN

## 2019-09-15 MED ORDER — MEPERIDINE HCL 50 MG/ML IJ SOLN
INTRAMUSCULAR | Status: DC | PRN
  Administered 2019-09-15: 20 mg via INTRAVENOUS
  Administered 2019-09-15: 10 mg via INTRAVENOUS
  Administered 2019-09-15: 20 mg via INTRAVENOUS

## 2019-09-15 MED ORDER — MEPERIDINE HCL 50 MG/ML IJ SOLN
INTRAMUSCULAR | Status: AC
  Filled 2019-09-15: qty 1

## 2019-09-15 NOTE — H&P (Signed)
Lisa Torres is an 73 y.o. female.   Chief Complaint: Patient is here for colonoscopy. HPI: Patient is 74 year old African-American female with history of colonic adenomas and is here for surveillance colonoscopy.  Last exam was in January 2015.  She denies abdominal pain change in bowel habits or rectal bleeding.  She says she took aspirin about 4 days ago. Family history is positive for CRC in her mother who was 3 at the time of diagnosis.  She had surgery and died same year in her sleep.    Past Medical History:  Diagnosis Date  . Breast cancer ([place]) 11/2011  . Chronic back pain   . Chronic leg pain    bilateral  . Chronic leukopenia   . DDD (degenerative disc disease), cervical   . DDD (degenerative disc disease), lumbar   . Gait abnormality   . GERD (gastroesophageal reflux disease)   . Hypertension   . Parkinson's disease ([place])   . Radiation 04/2012   33 treatments for breast cancer  . Right knee pain   . Thyroid disease     Past Surgical History:  Procedure Laterality Date  . ABDOMINAL HYSTERECTOMY     partial  . BREAST LUMPECTOMY Left 12/2011  . CHOLECYSTECTOMY    . COLONOSCOPY N/A 07/01/2013   Procedure: COLONOSCOPY;  Surgeon: Rogene Houston, MD;  [facility];  Service: Endoscopy;  Laterality: N/A;  1030  . ESOPHAGOGASTRODUODENOSCOPY N/A 11/21/2017   Procedure: ESOPHAGOGASTRODUODENOSCOPY (EGD);  Surgeon: Rogene Houston, MD;  [facility];  Service: Endoscopy;  Laterality: N/A;  2:45  . EXCISIONAL TOTAL KNEE ARTHROPLASTY WITH ANTIBIOTIC SPACERS Right 01/12/2018   Procedure: EXCISIONAL TOTAL KNEE ARTHROPLASTY WITH ANTIBIOTIC SPACERS;  Surgeon: Vickey Huger, MD;  Location: [place];  Service: Orthopedics;  Laterality: Right;  . JOINT REPLACEMENT  01/2017   right knee  . KNEE SURGERY Right 01/12/2018   EXCISIONAL TOTAL KNEE ARTHROPLASTY WITH ANTIBIOTIC SPACERS  . TOTAL KNEE REVISION Right 03/23/2018   Procedure: RIGHT TOTAL KNEE REVISION;   Surgeon: Vickey Huger, MD;  [facility];  Service: Orthopedics;  Laterality: Right;  . TUBAL LIGATION      Family History  Problem Relation Age of Onset  . Cancer Mother 70       colon cancer  . Cancer Father 75       throat cancer  . Pancreatic cancer Brother        [phone] 11-14-15   Social History:  reports that she quit smoking about 20 years ago. Her smoking use included cigarettes. She has a 9.00 pack-year smoking history. She has never used smokeless tobacco. She reports that she does not drink alcohol or use drugs.  Allergies:  Allergies  Allergen Reactions  . Hydrocodone Other (See Comments)    Heart pain  Delusions  . Tramadol Other (See Comments)    Hallucinations, abd pain Delusions (intolerance)    Medications Prior to Admission  Medication Sig Dispense Refill  . amLODipine (NORVASC) 5 MG tablet Take 5 mg by mouth daily.     Marland Kitchen aspirin EC 325 MG EC tablet Take 1 tablet (325 mg total) by mouth 2 (two) times daily. 30 tablet 0  . B Complex Vitamins (B COMPLEX PO) Place 1 mL under the tongue daily.    . baclofen (LIORESAL) 10 MG tablet Take 10 mg by mouth at bedtime.     . carbidopa-levodopa (SINEMET IR) 25-100 MG tablet Take 2 tablets by mouth 3 (three) times daily.    Marland Kitchen  diazepam (VALIUM) 5 MG tablet Take 5 mg by mouth 2 (two) times daily.    Marland Kitchen levothyroxine (SYNTHROID, LEVOTHROID) 100 MCG tablet Take 100 mcg by mouth daily before breakfast.    . pantoprazole (PROTONIX) 40 MG tablet Take 1 tablet (40 mg total) by mouth daily. (Patient taking differently: Take 40 mg by mouth daily before breakfast. ) 90 tablet 3  . polyethylene glycol powder (MIRALAX) 17 GM/SCOOP powder Take 17 g by mouth daily. (Patient taking differently: Take 17 g by mouth daily as needed (constipation.). ) 850 g 1  . traZODone (DESYREL) 50 MG tablet Take 100 mg by mouth at bedtime.       No results found for this or any previous visit (from the past 48 hour(s)). No results found.  Review of  Systems  Blood pressure (!) 149/73, pulse 75, temperature 97.6 F (36.4 C), temperature source Oral, resp. rate 17, height 5\' 1"  (1.549 m), weight 68 kg, SpO2 100 %. Physical Exam  Constitutional:  Well-developed thin female in NAD.  HENT:  Mouth/Throat: Oropharynx is clear and moist.  Eyes: Conjunctivae are normal. No scleral icterus.  Neck: No thyromegaly present.  Cardiovascular: Normal rate, regular rhythm and normal heart sounds.  No murmur heard. Respiratory: Effort normal and breath sounds normal.  GI: Soft. She exhibits no distension and no mass. There is no abdominal tenderness.  Musculoskeletal:        General: No edema.  Lymphadenopathy:    She has no cervical adenopathy.  Neurological: She is alert.  Skin: Skin is warm and dry.     Assessment/Plan History of colonic adenomas. Family history of CRC in mother at late onset. Surveillance colonoscopy.  Hildred Laser, MD 09/15/2019, 8:09 AM

## 2019-09-15 NOTE — Op Note (Signed)
[facility] Patient Name: Lisa Torres Procedure Date: 09/15/2019 7:55 AM MRN: [id number] Date of Birth: [birth year] Attending MD: Hildred Laser , MD CSN: [id number] Age: 74 Admit Type: Outpatient Procedure:                Colonoscopy Indications:              High risk colon cancer surveillance: Personal                            history of colonic polyps Providers:                Hildred Laser, MD, Hinton Rao, RN, Raphael Gibney, Technician Referring MD:             [name], MD Medicines:                Meperidine 50 mg IV, Midazolam 5 mg IV Complications:            No immediate complications. Estimated Blood Loss:     Estimated blood loss was minimal. Procedure:                Pre-Anesthesia Assessment:                           - Prior to the procedure, a History and Physical                            was performed, and patient medications and                            allergies were reviewed. The patient's tolerance of                            previous anesthesia was also reviewed. The risks                            and benefits of the procedure and the sedation                            options and risks were discussed with the patient.                            All questions were answered, and informed consent                            was obtained. Prior Anticoagulants: The patient has                            taken no previous anticoagulant or antiplatelet                            agents except for aspirin. ASA Grade Assessment:  III - A patient with severe systemic disease. After                            reviewing the risks and benefits, the patient was                            deemed in satisfactory condition to undergo the                            procedure.                           After obtaining informed consent, the colonoscope                            was passed under  direct vision. Throughout the                            procedure, the patient's blood pressure, pulse, and                            oxygen saturations were monitored continuously. The                            [id number]-H190DL [id number] scope was introduced through                            the anus and advanced to the the cecum, identified                            by appendiceal orifice and ileocecal valve. The                            colonoscopy was performed without difficulty. The                            patient tolerated the procedure well. The quality                            of the bowel preparation was good. The ileocecal                            valve, appendiceal orifice, and rectum were                            photographed. Scope In: 8:20:41 AM Scope Out: 8:40:28 AM Scope Withdrawal Time: 0 hours 6 minutes 37 seconds  Total Procedure Duration: 0 hours 19 minutes 47 seconds  Findings:      The perianal and digital rectal examinations were normal.      Two sessile polyps were found in the mid transverse colon. The polyps       were small in size. These polyps were removed with a cold snare.       Resection and retrieval were complete. The pathology specimen was placed  into Bottle Number 1.      A scar was found in the distal rectum. Impression:               - Two small polyps in the mid transverse colon,                            removed with a cold snare. Resected and retrieved.                           - Scar in the distal rectum. Moderate Sedation:      Moderate (conscious) sedation was administered by the endoscopy nurse       and supervised by the endoscopist. The following parameters were       monitored: oxygen saturation, heart rate, blood pressure, CO2       capnography and response to care. Total physician intraservice time was       25 minutes. Recommendation:           - Patient has a contact number available for                             emergencies. The signs and symptoms of potential                            delayed complications were discussed with the                            patient. Return to normal activities tomorrow.                            Written discharge instructions were provided to the                            patient.                           - Resume previous diet today.                           - Continue present medications.                           - No aspirin, ibuprofen, naproxen, or other                            non-steroidal anti-inflammatory drugs for 1 day.                           - Await pathology results.                           - Repeat colonoscopy in 5 years for surveillance. Procedure Code(s):        --- Professional ---                           [phone], Colonoscopy, flexible; with removal of  tumor(s), polyp(s), or other lesion(s) by snare                            technique                           99153, Moderate sedation; each additional 15                            minutes intraservice time                           G0500, Moderate sedation services provided by the                            same physician or other qualified health care                            professional performing a gastrointestinal                            endoscopic service that sedation supports,                            requiring the presence of an independent trained                            observer to assist in the monitoring of the                            patient's level of consciousness and physiological                            status; initial 15 minutes of intra-service time;                            patient age 29 years or older (additional time may                            be reported with [phone], as appropriate) Diagnosis Code(s):        --- Professional ---                           Z86.010, Personal history of colonic polyps                            K63.5, Polyp of colon                           K62.89, Other specified diseases of anus and rectum CPT copyright 2019 American Medical Association. All rights reserved. The codes documented in this report are preliminary and upon coder review may  be revised to meet current compliance requirements. Hildred Laser, MD Hildred Laser, MD 09/15/2019 8:48:02 AM This report has been signed electronically. Number of Addenda: 0

## 2019-09-15 NOTE — Discharge Instructions (Signed)
Resume aspirin on 09/16/2019 Resume other medications and diet as before. No driving for 24 hours. Physician will call with biopsy results.        Colonoscopy, Adult, Care After This sheet gives you information about how to care for yourself after your procedure. Your doctor may also give you more specific instructions. If you have problems or questions, call your doctor. What can I expect after the procedure? After the procedure, it is common to have:  A small amount of blood in your poop (stool) for 24 hours.  Some gas.  Mild cramping or bloating in your belly (abdomen). Follow these instructions at home: Eating and drinking   Drink enough fluid to keep your pee (urine) pale yellow.  Follow instructions from your doctor about what you cannot eat or drink.  Return to your normal diet as told by your doctor. Avoid heavy or fried foods that are hard to digest. Activity  Rest as told by your doctor.  Do not sit for a long time without moving. Get up to take short walks every 1-2 hours. This is important. Ask for help if you feel weak or unsteady.  Return to your normal activities as told by your doctor. Ask your doctor what activities are safe for you. To help cramping and bloating:   Try walking around.  Put heat on your belly as told by your doctor. Use the heat source that your doctor recommends, such as a moist heat pack or a heating pad. ? Put a towel between your skin and the heat source. ? Leave the heat on for 20-30 minutes. ? Remove the heat if your skin turns bright red. This is very important if you are unable to feel pain, heat, or cold. You may have a greater risk of getting burned. General instructions  For the first 24 hours after the procedure: ? Do not drive or use machinery. ? Do not sign important documents. ? Do not drink alcohol. ? Do your daily activities more slowly than normal. ? Eat foods that are soft and easy to digest.  Take  over-the-counter or prescription medicines only as told by your doctor.  Keep all follow-up visits as told by your doctor. This is important. Contact a doctor if:  You have blood in your poop 2-3 days after the procedure. Get help right away if:  You have more than a small amount of blood in your poop.  You see large clumps of tissue (blood clots) in your poop.  Your belly is swollen.  You feel like you may vomit (nauseous).  You vomit.  You have a fever.  You have belly pain that gets worse, and medicine does not help your pain. Summary  After the procedure, it is common to have a small amount of blood in your poop. You may also have mild cramping and bloating in your belly.  For the first 24 hours after the procedure, do not drive or use machinery, do not sign important documents, and do not drink alcohol.  Get help right away if you have a lot of blood in your poop, feel like you may vomit, have a fever, or have more belly pain. This information is not intended to replace advice given to you by your health care provider. Make sure you discuss any questions you have with your health care provider. Document Revised: 12/28/2018 Document Reviewed: 12/28/2018 Elsevier Patient Education  [place].     Colon Polyps  Polyps are tissue growths  inside the body. Polyps can grow in many places, including the large intestine (colon). A polyp may be a round bump or a mushroom-shaped growth. You could have one polyp or several. Most colon polyps are noncancerous (benign). However, some colon polyps can become cancerous over time. Finding and removing the polyps early can help prevent this. What are the causes? The exact cause of colon polyps is not known. What increases the risk? You are more likely to develop this condition if you:  Have a family history of colon cancer or colon polyps.  Are older than 36 or older than 45 if you are African American.  Have inflammatory  bowel disease, such as ulcerative colitis or Crohn's disease.  Have certain hereditary conditions, such as: ? Familial adenomatous polyposis. ? Lynch syndrome. ? Turcot syndrome. ? Peutz-Jeghers syndrome.  Are overweight.  Smoke cigarettes.  Do not get enough exercise.  Drink too much alcohol.  Eat a diet that is high in fat and red meat and low in fiber.  Had childhood cancer that was treated with abdominal radiation. What are the signs or symptoms? Most polyps do not cause symptoms. If you have symptoms, they may include:  Blood coming from your rectum when having a bowel movement.  Blood in your stool. The stool may look dark red or black.  Abdominal pain.  A change in bowel habits, such as constipation or diarrhea. How is this diagnosed? This condition is diagnosed with a colonoscopy. This is a procedure in which a lighted, flexible scope is inserted into the anus and then passed into the colon to examine the area. Polyps are sometimes found when a colonoscopy is done as part of routine cancer screening tests. How is this treated? Treatment for this condition involves removing any polyps that are found. Most polyps can be removed during a colonoscopy. Those polyps will then be tested for cancer. Additional treatment may be needed depending on the results of testing. Follow these instructions at home: Lifestyle  Maintain a healthy weight, or lose weight if recommended by your health care provider.  Exercise every day or as told by your health care provider.  Do not use any products that contain nicotine or tobacco, such as cigarettes and e-cigarettes. If you need help quitting, ask your health care provider.  If you drink alcohol, limit how much you have: ? 0-1 drink a day for women. ? 0-2 drinks a day for men.  Be aware of how much alcohol is in your drink. In the [place] one drink equals one 12 oz bottle of beer (355 mL), one 5 oz glass of wine (148 mL), or one 1 oz  shot of hard liquor (44 mL). Eating and drinking   Eat foods that are high in fiber, such as fruits, vegetables, and whole grains.  Eat foods that are high in calcium and vitamin D, such as milk, cheese, yogurt, eggs, liver, fish, and broccoli.  Limit foods that are high in fat, such as fried foods and desserts.  Limit the amount of red meat and processed meat you eat, such as hot dogs, sausage, bacon, and lunch meats. General instructions  Keep all follow-up visits as told by your health care provider. This is important. ? This includes having regularly scheduled colonoscopies. ? Talk to your health care provider about when you need a colonoscopy. Contact a health care provider if:  You have new or worsening bleeding during a bowel movement.  You have new or increased blood in  your stool.  You have a change in bowel habits.  You lose weight for no known reason. Summary  Polyps are tissue growths inside the body. Polyps can grow in many places, including the colon.  Most colon polyps are noncancerous (benign), but some can become cancerous over time.  This condition is diagnosed with a colonoscopy.  Treatment for this condition involves removing any polyps that are found. Most polyps can be removed during a colonoscopy. This information is not intended to replace advice given to you by your health care provider. Make sure you discuss any questions you have with your health care provider. Document Revised: 09/18/2017 Document Reviewed: 09/18/2017 Elsevier Patient Education  [place].

## 2019-09-16 LAB — SURGICAL PATHOLOGY

## 2019-10-13 DIAGNOSIS — G4700 Insomnia, unspecified: Secondary | ICD-10-CM | POA: Diagnosis not present

## 2019-10-13 DIAGNOSIS — I10 Essential (primary) hypertension: Secondary | ICD-10-CM | POA: Diagnosis not present

## 2019-10-13 DIAGNOSIS — M130 Polyarthritis, unspecified: Secondary | ICD-10-CM | POA: Diagnosis not present

## 2019-10-13 DIAGNOSIS — G20 Parkinson's disease: Secondary | ICD-10-CM | POA: Diagnosis not present

## 2019-10-13 DIAGNOSIS — R2689 Other abnormalities of gait and mobility: Secondary | ICD-10-CM | POA: Diagnosis not present

## 2020-01-24 DIAGNOSIS — M9903 Segmental and somatic dysfunction of lumbar region: Secondary | ICD-10-CM | POA: Diagnosis not present

## 2020-01-24 DIAGNOSIS — S338XXA Sprain of other parts of lumbar spine and pelvis, initial encounter: Secondary | ICD-10-CM | POA: Diagnosis not present

## 2020-01-26 DIAGNOSIS — S338XXA Sprain of other parts of lumbar spine and pelvis, initial encounter: Secondary | ICD-10-CM | POA: Diagnosis not present

## 2020-01-26 DIAGNOSIS — M9903 Segmental and somatic dysfunction of lumbar region: Secondary | ICD-10-CM | POA: Diagnosis not present

## 2020-01-28 DIAGNOSIS — S338XXA Sprain of other parts of lumbar spine and pelvis, initial encounter: Secondary | ICD-10-CM | POA: Diagnosis not present

## 2020-01-28 DIAGNOSIS — M9903 Segmental and somatic dysfunction of lumbar region: Secondary | ICD-10-CM | POA: Diagnosis not present

## 2020-02-02 DIAGNOSIS — M9903 Segmental and somatic dysfunction of lumbar region: Secondary | ICD-10-CM | POA: Diagnosis not present

## 2020-02-02 DIAGNOSIS — S338XXA Sprain of other parts of lumbar spine and pelvis, initial encounter: Secondary | ICD-10-CM | POA: Diagnosis not present

## 2020-02-07 DIAGNOSIS — M9903 Segmental and somatic dysfunction of lumbar region: Secondary | ICD-10-CM | POA: Diagnosis not present

## 2020-02-07 DIAGNOSIS — S338XXA Sprain of other parts of lumbar spine and pelvis, initial encounter: Secondary | ICD-10-CM | POA: Diagnosis not present

## 2020-02-08 DIAGNOSIS — M5432 Sciatica, left side: Secondary | ICD-10-CM | POA: Diagnosis not present

## 2020-02-08 DIAGNOSIS — R262 Difficulty in walking, not elsewhere classified: Secondary | ICD-10-CM | POA: Diagnosis not present

## 2020-02-08 DIAGNOSIS — M1990 Unspecified osteoarthritis, unspecified site: Secondary | ICD-10-CM | POA: Diagnosis not present

## 2020-02-10 DIAGNOSIS — M5432 Sciatica, left side: Secondary | ICD-10-CM | POA: Diagnosis not present

## 2020-02-10 DIAGNOSIS — M1990 Unspecified osteoarthritis, unspecified site: Secondary | ICD-10-CM | POA: Diagnosis not present

## 2020-02-10 DIAGNOSIS — R262 Difficulty in walking, not elsewhere classified: Secondary | ICD-10-CM | POA: Diagnosis not present

## 2020-02-14 DIAGNOSIS — M9903 Segmental and somatic dysfunction of lumbar region: Secondary | ICD-10-CM | POA: Diagnosis not present

## 2020-02-14 DIAGNOSIS — S338XXA Sprain of other parts of lumbar spine and pelvis, initial encounter: Secondary | ICD-10-CM | POA: Diagnosis not present

## 2020-02-17 DIAGNOSIS — R262 Difficulty in walking, not elsewhere classified: Secondary | ICD-10-CM | POA: Diagnosis not present

## 2020-02-17 DIAGNOSIS — M5432 Sciatica, left side: Secondary | ICD-10-CM | POA: Diagnosis not present

## 2020-02-17 DIAGNOSIS — M1990 Unspecified osteoarthritis, unspecified site: Secondary | ICD-10-CM | POA: Diagnosis not present

## 2020-02-21 DIAGNOSIS — R262 Difficulty in walking, not elsewhere classified: Secondary | ICD-10-CM | POA: Diagnosis not present

## 2020-02-21 DIAGNOSIS — M5432 Sciatica, left side: Secondary | ICD-10-CM | POA: Diagnosis not present

## 2020-02-21 DIAGNOSIS — M1990 Unspecified osteoarthritis, unspecified site: Secondary | ICD-10-CM | POA: Diagnosis not present

## 2020-02-22 DIAGNOSIS — S338XXA Sprain of other parts of lumbar spine and pelvis, initial encounter: Secondary | ICD-10-CM | POA: Diagnosis not present

## 2020-02-22 DIAGNOSIS — M9903 Segmental and somatic dysfunction of lumbar region: Secondary | ICD-10-CM | POA: Diagnosis not present

## 2020-02-23 DIAGNOSIS — M5432 Sciatica, left side: Secondary | ICD-10-CM | POA: Diagnosis not present

## 2020-02-23 DIAGNOSIS — M1990 Unspecified osteoarthritis, unspecified site: Secondary | ICD-10-CM | POA: Diagnosis not present

## 2020-02-23 DIAGNOSIS — R262 Difficulty in walking, not elsewhere classified: Secondary | ICD-10-CM | POA: Diagnosis not present

## 2020-02-28 DIAGNOSIS — M9903 Segmental and somatic dysfunction of lumbar region: Secondary | ICD-10-CM | POA: Diagnosis not present

## 2020-02-28 DIAGNOSIS — S338XXA Sprain of other parts of lumbar spine and pelvis, initial encounter: Secondary | ICD-10-CM | POA: Diagnosis not present

## 2020-02-29 DIAGNOSIS — R262 Difficulty in walking, not elsewhere classified: Secondary | ICD-10-CM | POA: Diagnosis not present

## 2020-02-29 DIAGNOSIS — M5432 Sciatica, left side: Secondary | ICD-10-CM | POA: Diagnosis not present

## 2020-02-29 DIAGNOSIS — M1990 Unspecified osteoarthritis, unspecified site: Secondary | ICD-10-CM | POA: Diagnosis not present

## 2020-03-02 DIAGNOSIS — M5432 Sciatica, left side: Secondary | ICD-10-CM | POA: Diagnosis not present

## 2020-03-02 DIAGNOSIS — R262 Difficulty in walking, not elsewhere classified: Secondary | ICD-10-CM | POA: Diagnosis not present

## 2020-03-02 DIAGNOSIS — M1990 Unspecified osteoarthritis, unspecified site: Secondary | ICD-10-CM | POA: Diagnosis not present

## 2020-03-06 DIAGNOSIS — H40053 Ocular hypertension, bilateral: Secondary | ICD-10-CM | POA: Diagnosis not present

## 2020-03-07 DIAGNOSIS — R262 Difficulty in walking, not elsewhere classified: Secondary | ICD-10-CM | POA: Diagnosis not present

## 2020-03-07 DIAGNOSIS — M1990 Unspecified osteoarthritis, unspecified site: Secondary | ICD-10-CM | POA: Diagnosis not present

## 2020-03-07 DIAGNOSIS — M5432 Sciatica, left side: Secondary | ICD-10-CM | POA: Diagnosis not present

## 2020-03-09 DIAGNOSIS — M9903 Segmental and somatic dysfunction of lumbar region: Secondary | ICD-10-CM | POA: Diagnosis not present

## 2020-03-09 DIAGNOSIS — S338XXA Sprain of other parts of lumbar spine and pelvis, initial encounter: Secondary | ICD-10-CM | POA: Diagnosis not present

## 2020-03-10 DIAGNOSIS — M1990 Unspecified osteoarthritis, unspecified site: Secondary | ICD-10-CM | POA: Diagnosis not present

## 2020-03-10 DIAGNOSIS — R262 Difficulty in walking, not elsewhere classified: Secondary | ICD-10-CM | POA: Diagnosis not present

## 2020-03-10 DIAGNOSIS — M5432 Sciatica, left side: Secondary | ICD-10-CM | POA: Diagnosis not present

## 2020-03-15 DIAGNOSIS — R262 Difficulty in walking, not elsewhere classified: Secondary | ICD-10-CM | POA: Diagnosis not present

## 2020-03-15 DIAGNOSIS — M5432 Sciatica, left side: Secondary | ICD-10-CM | POA: Diagnosis not present

## 2020-03-15 DIAGNOSIS — M1990 Unspecified osteoarthritis, unspecified site: Secondary | ICD-10-CM | POA: Diagnosis not present

## 2020-03-16 DIAGNOSIS — M9903 Segmental and somatic dysfunction of lumbar region: Secondary | ICD-10-CM | POA: Diagnosis not present

## 2020-03-16 DIAGNOSIS — S338XXA Sprain of other parts of lumbar spine and pelvis, initial encounter: Secondary | ICD-10-CM | POA: Diagnosis not present

## 2020-03-17 DIAGNOSIS — M1990 Unspecified osteoarthritis, unspecified site: Secondary | ICD-10-CM | POA: Diagnosis not present

## 2020-03-17 DIAGNOSIS — M5432 Sciatica, left side: Secondary | ICD-10-CM | POA: Diagnosis not present

## 2020-03-17 DIAGNOSIS — R262 Difficulty in walking, not elsewhere classified: Secondary | ICD-10-CM | POA: Diagnosis not present

## 2020-03-20 DIAGNOSIS — M5432 Sciatica, left side: Secondary | ICD-10-CM | POA: Diagnosis not present

## 2020-03-20 DIAGNOSIS — R262 Difficulty in walking, not elsewhere classified: Secondary | ICD-10-CM | POA: Diagnosis not present

## 2020-03-20 DIAGNOSIS — M1990 Unspecified osteoarthritis, unspecified site: Secondary | ICD-10-CM | POA: Diagnosis not present

## 2020-03-22 DIAGNOSIS — M1990 Unspecified osteoarthritis, unspecified site: Secondary | ICD-10-CM | POA: Diagnosis not present

## 2020-03-22 DIAGNOSIS — M5432 Sciatica, left side: Secondary | ICD-10-CM | POA: Diagnosis not present

## 2020-03-22 DIAGNOSIS — R262 Difficulty in walking, not elsewhere classified: Secondary | ICD-10-CM | POA: Diagnosis not present

## 2020-03-23 DIAGNOSIS — M9903 Segmental and somatic dysfunction of lumbar region: Secondary | ICD-10-CM | POA: Diagnosis not present

## 2020-03-23 DIAGNOSIS — S338XXA Sprain of other parts of lumbar spine and pelvis, initial encounter: Secondary | ICD-10-CM | POA: Diagnosis not present

## 2020-03-27 DIAGNOSIS — M1990 Unspecified osteoarthritis, unspecified site: Secondary | ICD-10-CM | POA: Diagnosis not present

## 2020-03-27 DIAGNOSIS — R262 Difficulty in walking, not elsewhere classified: Secondary | ICD-10-CM | POA: Diagnosis not present

## 2020-03-27 DIAGNOSIS — M5432 Sciatica, left side: Secondary | ICD-10-CM | POA: Diagnosis not present

## 2020-03-29 DIAGNOSIS — M9903 Segmental and somatic dysfunction of lumbar region: Secondary | ICD-10-CM | POA: Diagnosis not present

## 2020-03-29 DIAGNOSIS — S338XXA Sprain of other parts of lumbar spine and pelvis, initial encounter: Secondary | ICD-10-CM | POA: Diagnosis not present

## 2020-03-30 DIAGNOSIS — M1990 Unspecified osteoarthritis, unspecified site: Secondary | ICD-10-CM | POA: Diagnosis not present

## 2020-03-30 DIAGNOSIS — R262 Difficulty in walking, not elsewhere classified: Secondary | ICD-10-CM | POA: Diagnosis not present

## 2020-03-30 DIAGNOSIS — M5432 Sciatica, left side: Secondary | ICD-10-CM | POA: Diagnosis not present

## 2020-04-03 DIAGNOSIS — M5432 Sciatica, left side: Secondary | ICD-10-CM | POA: Diagnosis not present

## 2020-04-03 DIAGNOSIS — R262 Difficulty in walking, not elsewhere classified: Secondary | ICD-10-CM | POA: Diagnosis not present

## 2020-04-03 DIAGNOSIS — M1990 Unspecified osteoarthritis, unspecified site: Secondary | ICD-10-CM | POA: Diagnosis not present

## 2020-04-05 DIAGNOSIS — S338XXA Sprain of other parts of lumbar spine and pelvis, initial encounter: Secondary | ICD-10-CM | POA: Diagnosis not present

## 2020-04-05 DIAGNOSIS — M9903 Segmental and somatic dysfunction of lumbar region: Secondary | ICD-10-CM | POA: Diagnosis not present

## 2020-04-06 DIAGNOSIS — M5432 Sciatica, left side: Secondary | ICD-10-CM | POA: Diagnosis not present

## 2020-04-06 DIAGNOSIS — R262 Difficulty in walking, not elsewhere classified: Secondary | ICD-10-CM | POA: Diagnosis not present

## 2020-04-06 DIAGNOSIS — M1990 Unspecified osteoarthritis, unspecified site: Secondary | ICD-10-CM | POA: Diagnosis not present

## 2020-04-10 DIAGNOSIS — G4700 Insomnia, unspecified: Secondary | ICD-10-CM | POA: Diagnosis not present

## 2020-04-10 DIAGNOSIS — M130 Polyarthritis, unspecified: Secondary | ICD-10-CM | POA: Diagnosis not present

## 2020-04-10 DIAGNOSIS — G20 Parkinson's disease: Secondary | ICD-10-CM | POA: Diagnosis not present

## 2020-04-10 DIAGNOSIS — I10 Essential (primary) hypertension: Secondary | ICD-10-CM | POA: Diagnosis not present

## 2020-04-10 DIAGNOSIS — R2689 Other abnormalities of gait and mobility: Secondary | ICD-10-CM | POA: Diagnosis not present

## 2020-04-11 DIAGNOSIS — M1990 Unspecified osteoarthritis, unspecified site: Secondary | ICD-10-CM | POA: Diagnosis not present

## 2020-04-11 DIAGNOSIS — M5432 Sciatica, left side: Secondary | ICD-10-CM | POA: Diagnosis not present

## 2020-04-11 DIAGNOSIS — R262 Difficulty in walking, not elsewhere classified: Secondary | ICD-10-CM | POA: Diagnosis not present

## 2020-04-12 DIAGNOSIS — S338XXA Sprain of other parts of lumbar spine and pelvis, initial encounter: Secondary | ICD-10-CM | POA: Diagnosis not present

## 2020-04-12 DIAGNOSIS — M9903 Segmental and somatic dysfunction of lumbar region: Secondary | ICD-10-CM | POA: Diagnosis not present

## 2020-04-13 DIAGNOSIS — M1990 Unspecified osteoarthritis, unspecified site: Secondary | ICD-10-CM | POA: Diagnosis not present

## 2020-04-13 DIAGNOSIS — R262 Difficulty in walking, not elsewhere classified: Secondary | ICD-10-CM | POA: Diagnosis not present

## 2020-04-13 DIAGNOSIS — M5432 Sciatica, left side: Secondary | ICD-10-CM | POA: Diagnosis not present

## 2020-04-19 DIAGNOSIS — M9903 Segmental and somatic dysfunction of lumbar region: Secondary | ICD-10-CM | POA: Diagnosis not present

## 2020-04-19 DIAGNOSIS — S338XXA Sprain of other parts of lumbar spine and pelvis, initial encounter: Secondary | ICD-10-CM | POA: Diagnosis not present

## 2020-04-28 DIAGNOSIS — M25561 Pain in right knee: Secondary | ICD-10-CM | POA: Diagnosis not present

## 2020-04-28 DIAGNOSIS — M25571 Pain in right ankle and joints of right foot: Secondary | ICD-10-CM | POA: Diagnosis not present

## 2020-06-05 DIAGNOSIS — G4700 Insomnia, unspecified: Secondary | ICD-10-CM | POA: Diagnosis not present

## 2020-06-05 DIAGNOSIS — M130 Polyarthritis, unspecified: Secondary | ICD-10-CM | POA: Diagnosis not present

## 2020-06-05 DIAGNOSIS — I10 Essential (primary) hypertension: Secondary | ICD-10-CM | POA: Diagnosis not present

## 2020-06-05 DIAGNOSIS — G20 Parkinson's disease: Secondary | ICD-10-CM | POA: Diagnosis not present

## 2020-06-05 DIAGNOSIS — R2689 Other abnormalities of gait and mobility: Secondary | ICD-10-CM | POA: Diagnosis not present

## 2020-06-26 ENCOUNTER — Other Ambulatory Visit: Payer: Self-pay | Admitting: Family Medicine

## 2020-06-26 DIAGNOSIS — Z1231 Encounter for screening mammogram for malignant neoplasm of breast: Secondary | ICD-10-CM

## 2020-06-27 ENCOUNTER — Other Ambulatory Visit (INDEPENDENT_AMBULATORY_CARE_PROVIDER_SITE_OTHER): Payer: Self-pay | Admitting: Internal Medicine

## 2020-06-27 MED ORDER — PANTOPRAZOLE SODIUM 40 MG PO TBEC: 40.0000 mg | DELAYED_RELEASE_TABLET | Freq: Every day | ORAL | 3 refills | Status: AC

## 2020-06-27 NOTE — Progress Notes (Signed)
Pantoprazole prescription sent to patient's pharmacy for 90 days with 3 refills.

## 2020-08-07 ENCOUNTER — Ambulatory Visit
Admission: RE | Admit: 2020-08-07 | Discharge: 2020-08-07 | Disposition: A | Discharge status: Home / Self Care | Payer: Medicare HMO | Source: Ambulatory Visit | Attending: Family Medicine | Admitting: Family Medicine

## 2020-08-07 ENCOUNTER — Other Ambulatory Visit: Payer: Self-pay

## 2020-08-07 DIAGNOSIS — Z1231 Encounter for screening mammogram for malignant neoplasm of breast: Secondary | ICD-10-CM

## 2020-08-14 ENCOUNTER — Other Ambulatory Visit: Payer: Self-pay | Admitting: Family Medicine

## 2020-08-14 DIAGNOSIS — R928 Other abnormal and inconclusive findings on diagnostic imaging of breast: Secondary | ICD-10-CM

## 2020-08-28 DIAGNOSIS — G249 Dystonia, unspecified: Secondary | ICD-10-CM | POA: Diagnosis not present

## 2020-08-28 DIAGNOSIS — R2689 Other abnormalities of gait and mobility: Secondary | ICD-10-CM | POA: Diagnosis not present

## 2020-08-28 DIAGNOSIS — G20 Parkinson's disease: Secondary | ICD-10-CM | POA: Diagnosis not present

## 2020-08-28 DIAGNOSIS — G4700 Insomnia, unspecified: Secondary | ICD-10-CM | POA: Diagnosis not present

## 2020-08-28 DIAGNOSIS — I10 Essential (primary) hypertension: Secondary | ICD-10-CM | POA: Diagnosis not present

## 2020-08-28 DIAGNOSIS — M130 Polyarthritis, unspecified: Secondary | ICD-10-CM | POA: Diagnosis not present

## 2020-09-01 ENCOUNTER — Ambulatory Visit
Admission: RE | Admit: 2020-09-01 | Discharge: 2020-09-01 | Disposition: A | Discharge status: Home / Self Care | Payer: Medicare HMO | Source: Ambulatory Visit | Attending: Family Medicine | Admitting: Family Medicine

## 2020-09-01 ENCOUNTER — Other Ambulatory Visit: Payer: Self-pay

## 2020-09-01 ENCOUNTER — Other Ambulatory Visit: Payer: Self-pay | Admitting: Family Medicine

## 2020-09-01 DIAGNOSIS — R928 Other abnormal and inconclusive findings on diagnostic imaging of breast: Secondary | ICD-10-CM

## 2020-09-01 DIAGNOSIS — N6489 Other specified disorders of breast: Secondary | ICD-10-CM | POA: Diagnosis not present

## 2020-09-07 ENCOUNTER — Ambulatory Visit
Admission: RE | Admit: 2020-09-07 | Discharge: 2020-09-07 | Disposition: A | Discharge status: Home / Self Care | Payer: Medicare HMO | Source: Ambulatory Visit | Attending: Family Medicine | Admitting: Family Medicine

## 2020-09-07 ENCOUNTER — Other Ambulatory Visit: Payer: Medicare HMO

## 2020-09-07 ENCOUNTER — Other Ambulatory Visit: Payer: Self-pay

## 2020-09-07 DIAGNOSIS — Z170 Estrogen receptor positive status [ER+]: Secondary | ICD-10-CM | POA: Diagnosis not present

## 2020-09-07 DIAGNOSIS — R928 Other abnormal and inconclusive findings on diagnostic imaging of breast: Secondary | ICD-10-CM

## 2020-09-07 DIAGNOSIS — C50411 Malignant neoplasm of upper-outer quadrant of right female breast: Secondary | ICD-10-CM | POA: Diagnosis not present

## 2020-09-28 ENCOUNTER — Encounter: Payer: Self-pay | Admitting: General Surgery

## 2020-09-28 ENCOUNTER — Other Ambulatory Visit: Payer: Self-pay

## 2020-09-28 ENCOUNTER — Ambulatory Visit: Payer: Medicare HMO | Admitting: General Surgery

## 2020-09-28 VITALS — BP 160/84 | HR 62 | Temp 97.6°F | Resp 12 | Ht 61.5 in | Wt 166.0 lb

## 2020-09-28 DIAGNOSIS — C50411 Malignant neoplasm of upper-outer quadrant of right female breast: Secondary | ICD-10-CM | POA: Diagnosis not present

## 2020-09-28 DIAGNOSIS — Z170 Estrogen receptor positive status [ER+]: Secondary | ICD-10-CM | POA: Diagnosis not present

## 2020-09-28 NOTE — Progress Notes (Signed)
Lisa Torres; [id number]; April 13, 1946   HPI Patient is a 75 year old black female who was referred to my care by Consuello Masse for evaluation treatment of newly diagnosed right breast cancer.  This was found on a screening mammogram.  A biopsy of this was performed which revealed invasive ductal carcinoma, ER/PR positive, HER-2 negative.  Patient has had left breast cancer in the remote past which was treated with a partial mastectomy and radiation therapy.  Patient does not feel the lump.  She is primarily using a walker and wheelchair due to recent knee surgery.  She also suffers from Parkinson's disease. Past Medical History:  Diagnosis Date  . Breast cancer ([place]) 11/2011  . Chronic back pain   . Chronic leg pain    bilateral  . Chronic leukopenia   . DDD (degenerative disc disease), cervical   . DDD (degenerative disc disease), lumbar   . Gait abnormality   . GERD (gastroesophageal reflux disease)   . Hypertension   . Parkinson's disease ([place])   . Radiation 04/2012   33 treatments for breast cancer  . Right knee pain   . Thyroid disease     Past Surgical History:  Procedure Laterality Date  . ABDOMINAL HYSTERECTOMY     partial  . BREAST LUMPECTOMY Left 12/2011  . CHOLECYSTECTOMY    . COLONOSCOPY N/A 07/01/2013   Procedure: COLONOSCOPY;  Surgeon: Rogene Houston, MD;  Location: [facility];  Service: Endoscopy;  Laterality: N/A;  1030  . COLONOSCOPY N/A 09/15/2019   Procedure: COLONOSCOPY;  Surgeon: Rogene Houston, MD;  Location: [facility];  Service: Endoscopy;  Laterality: N/A;  815  . ESOPHAGOGASTRODUODENOSCOPY N/A 11/21/2017   Procedure: ESOPHAGOGASTRODUODENOSCOPY (EGD);  Surgeon: Rogene Houston, MD;  Location: [facility];  Service: Endoscopy;  Laterality: N/A;  2:45  . EXCISIONAL TOTAL KNEE ARTHROPLASTY WITH ANTIBIOTIC SPACERS Right 01/12/2018   Procedure: EXCISIONAL TOTAL KNEE ARTHROPLASTY WITH ANTIBIOTIC SPACERS;  Surgeon: Vickey Huger, MD;  Location: [place];  Service: Orthopedics;  Laterality: Right;  . JOINT REPLACEMENT  01/2017   right knee  . KNEE SURGERY Right 01/12/2018   EXCISIONAL TOTAL KNEE ARTHROPLASTY WITH ANTIBIOTIC SPACERS  . POLYPECTOMY  09/15/2019   Procedure: POLYPECTOMY;  Surgeon: Rogene Houston, MD;  [facility]: [facility];  Service: Endoscopy;;  Cold Snare transverse colon polyp  . TOTAL KNEE REVISION Right 03/23/2018   Procedure: RIGHT TOTAL KNEE REVISION;  Surgeon: Vickey Huger, MD;  Location: [facility];  Service: Orthopedics;  Laterality: Right;  . TUBAL LIGATION      Family History  Problem Relation Age of Onset  . Cancer Mother 67       colon cancer  . Cancer Father 75       throat cancer  . Pancreatic cancer Brother        [phone] 11-14-15    Current Outpatient Medications on File Prior to Visit  Medication Sig Dispense Refill  . amLODipine (NORVASC) 5 MG tablet Take 5 mg by mouth daily.     Marland Kitchen aspirin 325 MG EC tablet Take 1 tablet (325 mg total) by mouth 2 (two) times daily. 30 tablet 0  . B Complex Vitamins (B COMPLEX PO) Place 1 mL under the tongue daily.    . baclofen (LIORESAL) 10 MG tablet Take 10 mg by mouth at bedtime.     . carbidopa-levodopa (SINEMET IR) 25-100 MG tablet Take 2 tablets by mouth 3 (three) times daily.    . diazepam (VALIUM)  5 MG tablet Take 5 mg by mouth 2 (two) times daily.    Marland Kitchen levothyroxine (SYNTHROID, LEVOTHROID) 100 MCG tablet Take 100 mcg by mouth daily before breakfast.    . pantoprazole (PROTONIX) 40 MG tablet Take 1 tablet (40 mg total) by mouth daily before breakfast. 90 tablet 3  . polyethylene glycol powder (MIRALAX) 17 GM/SCOOP powder Take 17 g by mouth daily. (Patient taking differently: Take 17 g by mouth daily as needed (constipation.).) 850 g 1  . traZODone (DESYREL) 50 MG tablet Take 100 mg by mouth at bedtime.      No current facility-administered medications on file prior to visit.    Allergies  Allergen Reactions  . Hydrocodone Other (See Comments)    Heart pain   Delusions  . Tramadol Other (See Comments)    Hallucinations, abd pain Delusions (intolerance)    Social History   Substance and Sexual Activity  Alcohol Use No    Social History   Tobacco Use  Smoking Status Former Smoker  . Packs/day: 0.50  . Years: 18.00  . Pack years: 9.00  . Types: Cigarettes  . Quit date: 09/30/1998  . Years since quitting: 22.0  Smokeless Tobacco Never Used    Review of Systems  Constitutional: Positive for malaise/fatigue.  HENT: Negative.   Eyes: Negative.   Respiratory: Negative.   Cardiovascular: Negative.   Gastrointestinal: Negative.   Genitourinary: Negative.   Musculoskeletal: Negative.   Skin: Negative.   Neurological: Positive for tremors and sensory change.  Endo/Heme/Allergies: Negative.   Psychiatric/Behavioral: Negative.     Objective   Vitals:   09/28/20 0934  BP: (!) 160/84  Pulse: 62  Resp: 12  Temp: 97.6 F (36.4 C)  SpO2: 97%    Physical Exam Vitals reviewed. Exam conducted with a chaperone present.  Constitutional:      Appearance: Normal appearance. She is not ill-appearing.  HENT:     Head: Normocephalic and atraumatic.  Neck:     Vascular: No carotid bruit.  Cardiovascular:     Rate and Rhythm: Normal rate and regular rhythm.     Heart sounds: Normal heart sounds. No murmur heard. No friction rub. No gallop.   Pulmonary:     Effort: Pulmonary effort is normal. No respiratory distress.     Breath sounds: Normal breath sounds. No stridor. No wheezing, rhonchi or rales.  Musculoskeletal:     Cervical back: Normal range of motion and neck supple.  Lymphadenopathy:     Cervical: No cervical adenopathy.  Skin:    General: Skin is warm and dry.  Neurological:     Mental Status: She is alert and oriented to person, place, and time.   Breast: No dominant mass, nipple discharge, or dimpling noted in the right breast.  The axilla is negative for palpable nodes.  Left breast examination is unremarkable.  A  surgical scar is noted along the superior aspect of the left breast.  Mammography and pathology reports reviewed.  A 1.3 cm mass is present at the 10 o'clock position in the right breast which is biopsy positive for invasive ductal carcinoma.  Assessment  Invasive ductal carcinoma of right breast, ER/PR positive History of left breast carcinoma Parkinson's disease Recent knee surgery with evidence of degenerative joint disease requiring ambulation with a walker and/or wheelchair Plan   I did discuss multiple surgical options with the patient including a modified radical mastectomy versus a partial mastectomy with sentinel lymph node biopsy and postoperative radiation therapy.  Patient  seems very indecisive as to what she wants to do with her breast cancer in terms of treatment.  She does not want a modified radical mastectomy.  She states it is small and may not want any surgical intervention.  To help her with her decision making process, I have referred her to Dr. Delton Coombes for further evaluation and discussion of her options to treat the breast cancer.  Further surgical evaluation is pending that discussion.

## 2020-10-05 ENCOUNTER — Encounter (HOSPITAL_COMMUNITY): Payer: Self-pay

## 2020-10-05 NOTE — Progress Notes (Signed)
I have placed an introductory phone call to the patient today. I introduced myself and explained my role in the patient's care. I briefly explained what the patient can expect during her initial visit with Dr. Delton Coombes. I provided the patient with my contact information and encouraged her to call with questions or concerns.

## 2020-10-09 ENCOUNTER — Other Ambulatory Visit: Payer: Self-pay

## 2020-10-09 ENCOUNTER — Inpatient Hospital Stay (HOSPITAL_COMMUNITY): Payer: Medicare HMO | Attending: Hematology | Admitting: Hematology

## 2020-10-09 DIAGNOSIS — Z170 Estrogen receptor positive status [ER+]: Secondary | ICD-10-CM | POA: Diagnosis not present

## 2020-10-09 DIAGNOSIS — Z87891 Personal history of nicotine dependence: Secondary | ICD-10-CM | POA: Diagnosis not present

## 2020-10-09 DIAGNOSIS — G20 Parkinson's disease: Secondary | ICD-10-CM | POA: Insufficient documentation

## 2020-10-09 DIAGNOSIS — Z853 Personal history of malignant neoplasm of breast: Secondary | ICD-10-CM | POA: Insufficient documentation

## 2020-10-09 DIAGNOSIS — C50411 Malignant neoplasm of upper-outer quadrant of right female breast: Secondary | ICD-10-CM | POA: Insufficient documentation

## 2020-10-09 DIAGNOSIS — Z923 Personal history of irradiation: Secondary | ICD-10-CM | POA: Diagnosis not present

## 2020-10-09 DIAGNOSIS — Z800 Family history of malignant neoplasm of digestive organs: Secondary | ICD-10-CM | POA: Insufficient documentation

## 2020-10-09 NOTE — Progress Notes (Signed)
[facility] [address], [place] [address]   Patient Care Team: Sasser, Silvestre Moment, MD as PCP - General (Cardiology) Harl Bowie Alphonse Guild, MD as PCP - Cardiology (Cardiology) Brien Mates, RN as Oncology Nurse Navigator (Oncology)  CHIEF COMPLAINTS/PURPOSE OF CONSULTATION:  Newly diagnosed right breast cancer  HISTORY OF PRESENTING ILLNESS:  Lisa Torres 75 y.o. female is here because of recent diagnosis of right breast cancer, at the request of Dr. Aviva Signs.  Today she is accompanied by her daughter, Lisa Torres, and she reports feeling okay. She has a history of left breast cancer in 2013 and was seen in [facility] previously; she had surgery on 01/07/2012 and radiation though 04/2012 and took an anti-estrogen for several years, less than 5. She was diagnosed with Parkinson's disease in 2018 and is generally stable; it is preventing her from walking. She is taking carbidopa-levodopa. She denies having history of MI's or CVA's.  She lives with her husband and she is able to cook, bathe and dress herself, go to the bathroom on a bedside commode, and walks with a walker. She quit smoking in 2000 after smoking 1/2 PPD for many years. Her brother had pancreatic cancer; her mother had colon cancer.  I reviewed her records extensively and collaborated the history with the patient.  SUMMARY OF ONCOLOGIC HISTORY: Oncology History   No history exists.    In terms of breast cancer risk profile:  She menarched at early age of 50 and went to menopause at age 45 due to partial hysterectomy.  She had 3 pregnancy, her first child was born at age 84.  She received birth control pills for approximately several years.  She was never exposed to fertility medications or hormone replacement therapy.  She has positive family history of Breast/GYN/GI cancer.  MEDICAL HISTORY:  Past Medical History:  Diagnosis Date  . Breast cancer ([place]) 11/2011  .  Chronic back pain   . Chronic leg pain    bilateral  . Chronic leukopenia   . DDD (degenerative disc disease), cervical   . DDD (degenerative disc disease), lumbar   . Gait abnormality   . GERD (gastroesophageal reflux disease)   . Hypertension   . Parkinson's disease ([place])   . Radiation 04/2012   33 treatments for breast cancer  . Right knee pain   . Thyroid disease     SURGICAL HISTORY: Past Surgical History:  Procedure Laterality Date  . ABDOMINAL HYSTERECTOMY     partial  . BREAST LUMPECTOMY Left 12/2011  . CHOLECYSTECTOMY    . COLONOSCOPY N/A 07/01/2013   Procedure: COLONOSCOPY;  Surgeon: Rogene Houston, MD;  [facility];  Service: Endoscopy;  Laterality: N/A;  1030  . COLONOSCOPY N/A 09/15/2019   Procedure: COLONOSCOPY;  Surgeon: Rogene Houston, MD;  [facility];  Service: Endoscopy;  Laterality: N/A;  815  . ESOPHAGOGASTRODUODENOSCOPY N/A 11/21/2017   Procedure: ESOPHAGOGASTRODUODENOSCOPY (EGD);  Surgeon: Rogene Houston, MD;  [facility];  Service: Endoscopy;  Laterality: N/A;  2:45  . EXCISIONAL TOTAL KNEE ARTHROPLASTY WITH ANTIBIOTIC SPACERS Right 01/12/2018   Procedure: EXCISIONAL TOTAL KNEE ARTHROPLASTY WITH ANTIBIOTIC SPACERS;  Surgeon: Vickey Huger, MD;  Location: [place];  Service: Orthopedics;  Laterality: Right;  . JOINT REPLACEMENT  01/2017   right knee  . KNEE SURGERY Right 01/12/2018   EXCISIONAL TOTAL KNEE ARTHROPLASTY WITH ANTIBIOTIC SPACERS  . POLYPECTOMY  09/15/2019   Procedure: POLYPECTOMY;  Surgeon: Rogene Houston, MD;  [facility];  Service: Endoscopy;;  Cold Snare transverse colon polyp  . TOTAL KNEE REVISION Right 03/23/2018   Procedure: RIGHT TOTAL KNEE REVISION;  Surgeon: Vickey Huger, MD;  [facility];  Service: Orthopedics;  Laterality: Right;  . TUBAL LIGATION      SOCIAL HISTORY: Social History   Socioeconomic History  . Marital status: Married    Spouse name: Lisa Torres  . Number of  children: 3  . Years of education: 85 th  . Highest education level: Not on file  Occupational History    Comment: retired  Tobacco Use  . Smoking status: Former Smoker    Packs/day: 0.50    Years: 18.00    Pack years: 9.00    Types: Cigarettes    Quit date: 09/30/1998    Years since quitting: 22.0  . Smokeless tobacco: Never Used  Vaping Use  . Vaping Use: Never used  Substance and Sexual Activity  . Alcohol use: No  . Drug use: No  . Sexual activity: Not on file  Other Topics Concern  . Not on file  Social History Narrative   Patient lives at home with her husband Lisa Torres).    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 Difficulty of Paying Living Expenses: Not very hard  Food Insecurity: No Food Insecurity  . Worried About Charity fundraiser in the Last Year: Never true  . Ran Out of Food in the Last Year: Never true  Transportation Needs: No Transportation Needs  . Lack of Transportation (Medical): No  . Lack of Transportation (Non-Medical): No  Physical Activity: Inactive  . Days of Exercise per Week: 0 days  . Minutes of Exercise per Session: 0 min  Stress: No Stress Concern Present  . Feeling of Stress : Only a little  Social Connections: Socially Integrated  . Frequency of Communication with Friends and Family: More than three times a week  . Frequency of Social Gatherings with Friends and Family: More than three times a week  . Attends Religious Services: More than 4 times per year  . Active Member of Clubs or Organizations: No  . Attends Archivist Meetings: More than 4 times per year  . Marital Status: Married  Human resources officer Violence: Not At Risk  . Fear of Current or Ex-Partner: No  . Emotionally Abused: No  . Physically Abused: No  . Sexually Abused: No    FAMILY HISTORY: Family History  Problem Relation Age of Onset  . Cancer Mother 74       colon  cancer  . Cancer Father 75       throat cancer  . Pancreatic cancer Brother        63y 11-14-15    ALLERGIES:  is allergic to hydrocodone and tramadol.  MEDICATIONS:  Current Outpatient Medications  Medication Sig Dispense Refill  . amLODipine (NORVASC) 5 MG tablet Take 5 mg by mouth daily.     Marland Kitchen aspirin 325 MG EC tablet Take 1 tablet (325 mg total) by mouth 2 (two) times daily. 30 tablet 0  . B Complex Vitamins (B COMPLEX PO) Place 1 mL under the tongue daily.    . baclofen (LIORESAL) 10 MG tablet Take 10 mg by mouth at bedtime.     . carbidopa-levodopa (SINEMET IR) 25-100 MG tablet Take 2 tablets by  mouth 3 (three) times daily.    . diazepam (VALIUM) 5 MG tablet Take 5 mg by mouth 2 (two) times daily.    . Istradefylline (NOURIANZ) 20 MG TABS Nourianz 20 mg tablet  Take 1 tablet every day by oral route.    Marland Kitchen levothyroxine (SYNTHROID, LEVOTHROID) 100 MCG tablet Take 100 mcg by mouth daily before breakfast.    . pantoprazole (PROTONIX) 40 MG tablet Take 1 tablet (40 mg total) by mouth daily before breakfast. 90 tablet 3  . polyethylene glycol powder (MIRALAX) 17 GM/SCOOP powder Take 17 g by mouth daily. (Patient taking differently: Take 17 g by mouth daily as needed (constipation.).) 850 g 1  . traZODone (DESYREL) 50 MG tablet Take 100 mg by mouth at bedtime.      No current facility-administered medications for this visit.    REVIEW OF SYSTEMS:   Review of Systems  Constitutional: Positive for fatigue (50%). Negative for appetite change.  Musculoskeletal: Positive for gait problem (d/t Parkinson's).  Neurological: Positive for gait problem (d/t Parkinson's) and numbness (& tingling in occipital region).  All other systems reviewed and are negative.   PHYSICAL EXAMINATION: ECOG PERFORMANCE STATUS: 2 - Symptomatic, <50% confined to bed  Vitals:   10/09/20 1420  BP: (!) 174/77  Pulse: 67  Resp: 18  Temp: 97.8 F (36.6 C)  SpO2: 100%   There were no vitals filed for this  visit. Physical Exam Vitals reviewed.  Constitutional:      Appearance: Normal appearance.     Comments: In wheelchair  Cardiovascular:     Rate and Rhythm: Normal rate and regular rhythm.     Pulses: Normal pulses.     Heart sounds: Normal heart sounds.  Pulmonary:     Effort: Pulmonary effort is normal.     Breath sounds: Normal breath sounds.  Chest:  Breasts:     Right: Normal. No swelling, bleeding, inverted nipple, mass, nipple discharge, skin change, tenderness, axillary adenopathy or supraclavicular adenopathy.     Left: Normal. No swelling, bleeding, inverted nipple, mass, nipple discharge, skin change (lumpectomy scar well-healed), tenderness, axillary adenopathy or supraclavicular adenopathy.    Musculoskeletal:     Right lower leg: No edema.     Left lower leg: No edema.  Lymphadenopathy:     Upper Body:     Right upper body: No supraclavicular, axillary or pectoral adenopathy.     Left upper body: No supraclavicular, axillary or pectoral adenopathy.  Neurological:     General: No focal deficit present.     Mental Status: She is alert and oriented to person, place, and time.  Psychiatric:        Mood and Affect: Mood normal.        Behavior: Behavior normal.      LABORATORY DATA:  I have reviewed the data as listed No results found for this or any previous visit (from the past 2160 hour(s)).  Surgical pathology (Accession [id number]) on 09/07/2020: Right breast biopsy: invasive ductal carcinoma, ER/PR positive, HER-2 negative, Ki-67 5%.  RADIOGRAPHIC STUDIES: I have personally reviewed the radiological reports and agreed with the findings in the report. No results found.   ASSESSMENT:  1.  Right breast IDC in the upper outer quadrant, (T1CN0) ER/PR positive, HER-2 negative: - Screening mammogram on 08/07/2020 BI-RADS Category 0. - Right breast mammogram and ultrasound on 09/01/2020 shows hypoechoic mass with irregular shape and borders at the 10 o'clock  position of the right breast, 7 cm from the nipple  measuring 0.8 x 1 x 1.3 cm.  Ultrasound of the right axilla was normal. - Biopsy of the right breast 10 o'clock position consistent with IDC, ER 95% positive, PR 90% positive, Ki-67 5%, HER2 2+ by IHC, negative by FISH.  2.  Stage I left breast cancer: - Diagnosed 01/07/2012, ER/PR positive, status post lumpectomy and SLNB. - XRT completed in November 2013. - Took 5 years of anastrozole.  3.  Social/family history: - She lives with husband at home and is independent of ADLs.  Walks with help of walker.  Denies any falls. - Quit smoking in 2000.  Smoked 3 to 4 cigarettes/day for many years. - Brother had pancreatic cancer.  Mother had colon cancer.  No family history of breast cancers.   PLAN:  1.  Stage I (T1CN0) right breast upper outer quadrant IDC, ER/PR positive, HER2 negative: - We have reviewed results of mammogram. - We also discussed results of biopsy of the right breast in detail. - Recommend lumpectomy. - Sentinel lymph node biopsy may be omitted as she has no palpable axillary lymph nodes and ultrasound was negative. - She has already seen Dr. Arnoldo Morale.  We will reach out to Dr. Arnoldo Morale office to schedule her for surgery. - RTC 4 weeks after surgery to discuss pathology and to start on aromatase inhibitor.  2.  Bone health: - Recommend DEXA scan prior to next visit. - We will also check vitamin D levels.   All questions were answered. The patient knows to call the clinic with any problems, questions or concerns.   Derek Jack, MD 10/09/20 2:55 PM  [facility] [phone]   I, Milinda Antis, am acting as a scribe for Dr. Sanda Linger.  I, Derek Jack MD, have reviewed the above documentation for accuracy and completeness, and I agree with the above.

## 2020-10-09 NOTE — Patient Instructions (Signed)
[place] at [facility] Discharge Instructions  You were seen and examined today by Dr. Delton Coombes. Dr. Delton Coombes is a medical oncologist, meaning he specializes in the management of cancer diagnoses with medications. Dr. Delton Coombes discussed your past medical history, family history of cancer and the events that led to you being here today.  You were referred here due to breast cancer recurrence. Dr. Delton Coombes has recommended a lumpectomy with sentinel lymph node removal. Dr. Delton Coombes does not recommend any radiation but does recommend that you go on an anti-estrogen medication for at least five years. Dr. Delton Coombes will see you again about a month after surgery.  Please call the clinic and let us know when your surgery has been scheduled so that we can schedule you accordingly.   Thank you for choosing [place] at [facility] to provide your oncology and hematology care.  To afford each patient quality time with our provider, please arrive at least 15 minutes before your scheduled appointment time.   If you have a lab appointment with the [facility] please come in thru the Main Entrance and check in at the main information desk.  You need to re-schedule your appointment should you arrive 10 or more minutes late.  We strive to give you quality time with our providers, and arriving late affects you and other patients whose appointments are after yours.  Also, if you no show three or more times for appointments you may be dismissed from the clinic at the providers discretion.     Again, thank you for choosing [facility].  Our hope is that these requests will decrease the amount of time that you wait before being seen by our physicians.       _____________________________________________________________  Should you have questions after your visit to [facility], please contact our office at [phone] and follow  the prompts.  Our office hours are 8:00 a.m. and 4:30 p.m. Monday - Friday.  Please note that voicemails left after 4:00 p.m. may not be returned until the following business day.  We are closed weekends and major holidays.  You do have access to a nurse 24-7, just call the main number to the clinic [phone] and do not press any options, hold on the line and a nurse will answer the phone.    For prescription refill requests, have your pharmacy contact our office and allow 72 hours.    Due to Covid, you will need to wear a mask upon entering the hospital. If you do not have a mask, a mask will be given to you at the Main Entrance upon arrival. For doctor visits, patients may have 1 support person age 27 or older with them. For treatment visits, patients can not have anyone with them due to social distancing guidelines and our immunocompromised population.

## 2020-10-10 ENCOUNTER — Encounter (HOSPITAL_COMMUNITY): Payer: Self-pay

## 2020-10-10 NOTE — Progress Notes (Signed)
I met with the patient and her daughter during initial visit with Dr. Delton Coombes. I provided my contact information and encouraged the patient and family to call with questions or concerns. Patient agreeable to proceed with lumpectomy at this time, I have called Dr. Cam Hai office and made them aware.

## 2020-10-19 ENCOUNTER — Encounter: Payer: Self-pay | Admitting: General Surgery

## 2020-10-19 ENCOUNTER — Other Ambulatory Visit: Payer: Self-pay

## 2020-10-19 ENCOUNTER — Other Ambulatory Visit (HOSPITAL_COMMUNITY): Payer: Self-pay | Admitting: General Surgery

## 2020-10-19 ENCOUNTER — Ambulatory Visit: Payer: Medicare HMO | Admitting: General Surgery

## 2020-10-19 VITALS — BP 174/83 | HR 60 | Temp 98.4°F | Resp 12 | Ht 61.5 in | Wt 166.0 lb

## 2020-10-19 DIAGNOSIS — R928 Other abnormal and inconclusive findings on diagnostic imaging of breast: Secondary | ICD-10-CM

## 2020-10-19 DIAGNOSIS — C50411 Malignant neoplasm of upper-outer quadrant of right female breast: Secondary | ICD-10-CM

## 2020-10-19 NOTE — Progress Notes (Signed)
Subjective:     Lisa Torres  Here for follow-up visit.  Patient was seen by oncology and the patient would like to proceed with a right partial mastectomy.  Oncology states that no sentinel node dissection is warranted. Objective:    BP (!) 174/83   Pulse 60   Temp 98.4 F (36.9 C) (Other (Comment))   Resp 12   Ht 5' 1.5" (1.562 m)   Wt 166 lb (75.3 kg)   SpO2 94%   BMI 30.86 kg/m   General:  alert, cooperative and no distress       Assessment:    Right breast carcinoma    Plan:   We will schedule patient for a right partial mastectomy after radiofrequency tag placement.  The risks and benefits of the procedure including bleeding, infection, cardiopulmonary difficulties, and incomplete margins were fully explained to the patient, who gave informed consent.

## 2020-10-19 NOTE — H&P (Signed)
Lisa Torres; [id number]; April 13, 1946   HPI Patient is a 75 year old black female who was referred to my care by Consuello Masse for evaluation treatment of newly diagnosed right breast cancer.  This was found on a screening mammogram.  A biopsy of this was performed which revealed invasive ductal carcinoma, ER/PR positive, HER-2 negative.  Patient has had left breast cancer in the remote past which was treated with a partial mastectomy and radiation therapy.  Patient does not feel the lump.  She is primarily using a walker and wheelchair due to recent knee surgery.  She also suffers from Parkinson's disease. Past Medical History:  Diagnosis Date  . Breast cancer ([place]) 11/2011  . Chronic back pain   . Chronic leg pain    bilateral  . Chronic leukopenia   . DDD (degenerative disc disease), cervical   . DDD (degenerative disc disease), lumbar   . Gait abnormality   . GERD (gastroesophageal reflux disease)   . Hypertension   . Parkinson's disease ([place])   . Radiation 04/2012   33 treatments for breast cancer  . Right knee pain   . Thyroid disease     Past Surgical History:  Procedure Laterality Date  . ABDOMINAL HYSTERECTOMY     partial  . BREAST LUMPECTOMY Left 12/2011  . CHOLECYSTECTOMY    . COLONOSCOPY N/A 07/01/2013   Procedure: COLONOSCOPY;  Surgeon: Rogene Houston, MD;  [facility];  Service: Endoscopy;  Laterality: N/A;  1030  . COLONOSCOPY N/A 09/15/2019   Procedure: COLONOSCOPY;  Surgeon: Rogene Houston, MD;  [facility];  Service: Endoscopy;  Laterality: N/A;  815  . ESOPHAGOGASTRODUODENOSCOPY N/A 11/21/2017   Procedure: ESOPHAGOGASTRODUODENOSCOPY (EGD);  Surgeon: Rogene Houston, MD;  [facility];  Service: Endoscopy;  Laterality: N/A;  2:45  . EXCISIONAL TOTAL KNEE ARTHROPLASTY WITH ANTIBIOTIC SPACERS Right 01/12/2018   Procedure: EXCISIONAL TOTAL KNEE ARTHROPLASTY WITH ANTIBIOTIC SPACERS;  Surgeon: Vickey Huger, MD;  Location: [place];  Service: Orthopedics;  Laterality: Right;  . JOINT REPLACEMENT  01/2017   right knee  . KNEE SURGERY Right 01/12/2018   EXCISIONAL TOTAL KNEE ARTHROPLASTY WITH ANTIBIOTIC SPACERS  . POLYPECTOMY  09/15/2019   Procedure: POLYPECTOMY;  Surgeon: Rogene Houston, MD;  [facility];  Service: Endoscopy;;  Cold Snare transverse colon polyp  . TOTAL KNEE REVISION Right 03/23/2018   Procedure: RIGHT TOTAL KNEE REVISION;  Surgeon: Vickey Huger, MD;  [facility];  Service: Orthopedics;  Laterality: Right;  . TUBAL LIGATION      Family History  Problem Relation Age of Onset  . Cancer Mother 67       colon cancer  . Cancer Father 75       throat cancer  . Pancreatic cancer Brother        [phone] 11-14-15    Current Outpatient Medications on File Prior to Visit  Medication Sig Dispense Refill  . amLODipine (NORVASC) 5 MG tablet Take 5 mg by mouth daily.     Marland Kitchen aspirin 325 MG EC tablet Take 1 tablet (325 mg total) by mouth 2 (two) times daily. 30 tablet 0  . B Complex Vitamins (B COMPLEX PO) Place 1 mL under the tongue daily.    . baclofen (LIORESAL) 10 MG tablet Take 10 mg by mouth at bedtime.     . carbidopa-levodopa (SINEMET IR) 25-100 MG tablet Take 2 tablets by mouth 3 (three) times daily.    . diazepam (VALIUM)  5 MG tablet Take 5 mg by mouth 2 (two) times daily.    Marland Kitchen levothyroxine (SYNTHROID, LEVOTHROID) 100 MCG tablet Take 100 mcg by mouth daily before breakfast.    . pantoprazole (PROTONIX) 40 MG tablet Take 1 tablet (40 mg total) by mouth daily before breakfast. 90 tablet 3  . polyethylene glycol powder (MIRALAX) 17 GM/SCOOP powder Take 17 g by mouth daily. (Patient taking differently: Take 17 g by mouth daily as needed (constipation.).) 850 g 1  . traZODone (DESYREL) 50 MG tablet Take 100 mg by mouth at bedtime.      No current facility-administered medications on file prior to visit.    Allergies  Allergen Reactions  . Hydrocodone Other (See Comments)    Heart pain   Delusions  . Tramadol Other (See Comments)    Hallucinations, abd pain Delusions (intolerance)    Social History   Substance and Sexual Activity  Alcohol Use No    Social History   Tobacco Use  Smoking Status Former Smoker  . Packs/day: 0.50  . Years: 18.00  . Pack years: 9.00  . Types: Cigarettes  . Quit date: 09/30/1998  . Years since quitting: 22.0  Smokeless Tobacco Never Used    Review of Systems  Constitutional: Positive for malaise/fatigue.  HENT: Negative.   Eyes: Negative.   Respiratory: Negative.   Cardiovascular: Negative.   Gastrointestinal: Negative.   Genitourinary: Negative.   Musculoskeletal: Negative.   Skin: Negative.   Neurological: Positive for tremors and sensory change.  Endo/Heme/Allergies: Negative.   Psychiatric/Behavioral: Negative.     Objective   Vitals:   09/28/20 0934  BP: (!) 160/84  Pulse: 62  Resp: 12  Temp: 97.6 F (36.4 C)  SpO2: 97%    Physical Exam Vitals reviewed. Exam conducted with a chaperone present.  Constitutional:      Appearance: Normal appearance. She is not ill-appearing.  HENT:     Head: Normocephalic and atraumatic.  Neck:     Vascular: No carotid bruit.  Cardiovascular:     Rate and Rhythm: Normal rate and regular rhythm.     Heart sounds: Normal heart sounds. No murmur heard. No friction rub. No gallop.   Pulmonary:     Effort: Pulmonary effort is normal. No respiratory distress.     Breath sounds: Normal breath sounds. No stridor. No wheezing, rhonchi or rales.  Musculoskeletal:     Cervical back: Normal range of motion and neck supple.  Lymphadenopathy:     Cervical: No cervical adenopathy.  Skin:    General: Skin is warm and dry.  Neurological:     Mental Status: She is alert and oriented to person, place, and time.   Breast: No dominant mass, nipple discharge, or dimpling noted in the right breast.  The axilla is negative for palpable nodes.  Left breast examination is unremarkable.  A  surgical scar is noted along the superior aspect of the left breast.  Mammography and pathology reports reviewed.  A 1.3 cm mass is present at the 10 o'clock position in the right breast which is biopsy positive for invasive ductal carcinoma.  Assessment  Invasive ductal carcinoma of right breast, ER/PR positive History of left breast carcinoma Parkinson's disease Recent knee surgery with evidence of degenerative joint disease requiring ambulation with a walker and/or wheelchair Plan   Patient has been seen in consultation with Dr. Delton Coombes.  She has elected to proceed with a right partial mastectomy after radiofrequency tag identification.  She does not need  a sentinel lymph node biopsy.  The risks and benefits of the procedure including bleeding, infection, cardiopulmonary difficulties, and unclear margins were fully explained to the patient, who gave informed consent.

## 2020-10-31 ENCOUNTER — Other Ambulatory Visit (HOSPITAL_COMMUNITY): Payer: Self-pay | Admitting: General Surgery

## 2020-10-31 ENCOUNTER — Encounter (HOSPITAL_COMMUNITY): Payer: Self-pay

## 2020-10-31 ENCOUNTER — Ambulatory Visit (HOSPITAL_COMMUNITY)
Admission: RE | Admit: 2020-10-31 | Discharge: 2020-10-31 | Disposition: A | Discharge status: Home / Self Care | Payer: Medicare HMO | Source: Ambulatory Visit | Attending: General Surgery | Admitting: General Surgery

## 2020-10-31 DIAGNOSIS — C50411 Malignant neoplasm of upper-outer quadrant of right female breast: Secondary | ICD-10-CM | POA: Diagnosis not present

## 2020-10-31 DIAGNOSIS — R928 Other abnormal and inconclusive findings on diagnostic imaging of breast: Secondary | ICD-10-CM | POA: Diagnosis not present

## 2020-10-31 MED ORDER — LIDOCAINE-EPINEPHRINE (PF) 2 %-1:200000 IJ SOLN
10.0000 mL | Freq: Once | INTRAMUSCULAR | Status: AC
  Administered 2020-10-31: 10 mL via INTRADERMAL
  Filled 2020-10-31: qty 10

## 2020-10-31 NOTE — Patient Instructions (Signed)
Lisa Torres  10/31/2020     @PREFPERIOPPHARMACY @   Your procedure is scheduled on  11/03/2020.   Report to [facility] at  [phone]  A.M.   Call this number if you have problems the morning of surgery:  [phone]   Remember:  Do not eat or drink after midnight.                       Take these medicines the morning of surgery with A SIP OF WATER  Amlodipine, sinemet, valium (if needed), nourianz, levothyroxine, protonix.   Place clean sheets on your bed the night before your procedure and DO NOT sleep with pets this night.  Shower with CHG the night before and the morning of your procedure. DO NOT use CHG on your face, hair or genitals.  After each shower, dry off with a clean towel, put on clean, comfortable clothes and brush your teeth.      Do not wear jewelry, make-up or nail polish.  Do not wear lotions, powders, or perfumes, or deodorant.  Do not shave 48 hours prior to surgery.  Men may shave face and neck.  Do not bring valuables to the hospital.  [facility] is not responsible for any belongings or valuables.  Contacts, dentures or bridgework may not be worn into surgery.  Leave your suitcase in the car.  After surgery it may be brought to your room.  For patients admitted to the hospital, discharge time will be determined by your treatment team.  Patients discharged the day of surgery will not be allowed to drive home and must have someone with them for 24 hours.   Special instructions:  DO NOT smoke tobacco or vape for 24 hours before your procedure.  Please read over the following fact sheets that you were given. Coughing and Deep Breathing, Surgical Site Infection Prevention, Anesthesia Post-op Instructions and Care and Recovery After Surgery       Lumpectomy, Care After This sheet gives you information about how to care for yourself after your procedure. Your health care provider may also give you more specific instructions. If  you have problems or questions, contact your health care provider. What can I expect after the procedure? After the procedure, it is common to have:  Breast swelling.  Breast tenderness.  Stiffness in your arm or shoulder.  A change in the shape and feel of your breast.  Scar tissue that feels hard to the touch in the area where the lump was removed. Follow these instructions at home: Medicines  Take over-the-counter and prescription medicines only as told by your health care provider.  If you were prescribed an antibiotic medicine, take it as told by your health care provider. Do not stop taking the antibiotic even if you start to feel better.  Ask your health care provider if the medicine prescribed to you: ? Requires you to avoid driving or using heavy machinery. ? Can cause constipation. You may need to take these actions to prevent or treat constipation:  Drink enough fluid to keep your urine pale yellow.  Take over-the-counter or prescription medicines.  Eat foods that are high in fiber, such as beans, whole grains, and fresh fruits and vegetables.  Limit foods that are high in fat and processed sugars, such as fried or sweet foods. Incision care  Follow instructions from your health care provider about how to take care of your incision. Make sure  you: ? Wash your hands with soap and water before and after you change your bandage (dressing). If soap and water are not available, use hand sanitizer. ? Change your dressing as told by your health care provider. ? Leave stitches (sutures), skin glue, or adhesive strips in place. These skin closures may need to stay in place for 2 weeks or longer. If adhesive strip edges start to loosen and curl up, you may trim the loose edges. Do not remove adhesive strips completely unless your health care provider tells you to do that.  Check your incision area every day for signs of infection. Check for: ? More redness, swelling, or  pain. ? Fluid or blood. ? Warmth. ? Pus or a bad smell.  Keep your dressing clean and dry.  If you were sent home with a surgical drain in place, follow instructions from your health care provider about emptying it.      Bathing  Do not take baths, swim, or use a hot tub until your health care provider approves.  Ask your health care provider if you may take showers. You may only be allowed to take sponge baths. Activity  Rest as told by your health care provider.  Avoid sitting for a long time without moving. Get up to take short walks every 1-2 hours. This is important to improve blood flow and breathing. Ask for help if you feel weak or unsteady.  Return to your normal activities as told by your health care provider. Ask your health care provider what activities are safe for you.  Be careful to avoid any activities that could cause an injury to your arm on the side of your surgery.  Do not lift anything that is heavier than 10 lb (4.5 kg), or the limit that you are told, until your health care provider says that it is safe. Avoid lifting with the arm that is on the side of your surgery.  Do not carry heavy objects on your shoulder on the side of your surgery.  Do exercises to keep your shoulder and arm from getting stiff and swollen. Talk with your health care provider about which exercises are safe for you. General instructions  Wear a supportive bra as told by your health care provider.  Raise (elevate) your arm above the level of your heart while you are sitting or lying down.  Do not wear tight jewelry on your arm, wrist, or fingers on the side of your surgery.  Keep all follow-up visits as told by your health care provider. This is important. ? You may need to be screened for extra fluid around the lymph nodes and swelling in the breast and arm (lymphedema). Follow instructions from your health care provider about how often you should be checked.  If you had any lymph  nodes removed during your procedure, be sure to tell all of your health care providers. This is important information to share before you are involved in certain procedures, such as having blood tests or having your blood pressure taken. Contact a health care provider if:  You develop a rash.  You have a fever.  Your pain medicine is not working.  You have swelling, weakness, or numbness in your arm that does not improve after a few weeks.  You have new swelling in your breast.  You have any of these signs of infection: ? More redness, swelling, or pain in your incision area. ? Fluid or blood coming from your incision. ? Warmth  coming from the incision area. ? Pus or a bad smell coming from your incision. Get help right away if you have:  Very bad pain in your breast or arm.  Swelling in your legs or arms.  Redness, warmth, or pain in your leg or arm.  Chest pain.  Difficulty breathing. Summary  After the procedure, it is common to have breast tenderness, swelling in your breast, and stiffness in your arm and shoulder.  Follow instructions from your health care provider about how to take care of your incision.  Do not lift anything that is heavier than 10 lb (4.5 kg), or the limit that you are told, until your health care provider says that it is safe. Avoid lifting with the arm that is on the side of your surgery.  If you had any lymph nodes removed during your procedure, be sure to tell all of your health care providers. This is important information to share before you are involved in certain procedures, such as having blood tests or having your blood pressure taken. This information is not intended to replace advice given to you by your health care provider. Make sure you discuss any questions you have with your health care provider. Document Revised: 12/07/2018 Document Reviewed: 12/07/2018 Elsevier Patient Education  2021 [place] [facility], Adult, Care  After This sheet gives you information about how to care for yourself after your procedure. Your health care provider may also give you more specific instructions. If you have problems or questions, contact your health care provider. What can I expect after the procedure? After the procedure, the following side effects are common:  Pain or discomfort at the IV site.  Nausea.  Vomiting.  Sore throat.  Trouble concentrating.  Feeling cold or chills.  Feeling weak or tired.  Sleepiness and fatigue.  Soreness and body aches. These side effects can affect parts of the body that were not involved in surgery. Follow these instructions at home: For the time period you were told by your health care provider:  Rest.  Do not participate in activities where you could fall or become injured.  Do not drive or use machinery.  Do not drink alcohol.  Do not take sleeping pills or medicines that cause drowsiness.  Do not make important decisions or sign legal documents.  Do not take care of children on your own.   Eating and drinking  Follow any instructions from your health care provider about eating or drinking restrictions.  When you feel hungry, start by eating small amounts of foods that are soft and easy to digest (bland), such as toast. Gradually return to your regular diet.  Drink enough fluid to keep your urine pale yellow.  If you vomit, rehydrate by drinking water, juice, or clear broth. General instructions  If you have sleep apnea, surgery and certain medicines can increase your risk for breathing problems. Follow instructions from your health care provider about wearing your sleep device: ? Anytime you are sleeping, including during daytime naps. ? While taking prescription pain medicines, sleeping medicines, or medicines that make you drowsy.  Have a responsible adult stay with you for the time you are told. It is important to have someone help care for you until you  are awake and alert.  Return to your normal activities as told by your health care provider. Ask your health care provider what activities are safe for you.  Take over-the-counter and prescription medicines only as told by your health care  provider.  If you smoke, do not smoke without supervision.  Keep all follow-up visits as told by your health care provider. This is important. Contact a health care provider if:  You have nausea or vomiting that does not get better with medicine.  You cannot eat or drink without vomiting.  You have pain that does not get better with medicine.  You are unable to pass urine.  You develop a skin rash.  You have a fever.  You have redness around your IV site that gets worse. Get help right away if:  You have difficulty breathing.  You have chest pain.  You have blood in your urine or stool, or you vomit blood. Summary  After the procedure, it is common to have a sore throat or nausea. It is also common to feel tired.  Have a responsible adult stay with you for the time you are told. It is important to have someone help care for you until you are awake and alert.  When you feel hungry, start by eating small amounts of foods that are soft and easy to digest (bland), such as toast. Gradually return to your regular diet.  Drink enough fluid to keep your urine pale yellow.  Return to your normal activities as told by your health care provider. Ask your health care provider what activities are safe for you. This information is not intended to replace advice given to you by your health care provider. Make sure you discuss any questions you have with your health care provider. Document Revised: 02/17/2020 Document Reviewed: 09/16/2019 Elsevier Patient Education  2021 [employer].

## 2020-10-31 NOTE — Sedation Documentation (Signed)
PT tolerated right breast radio frequency tag placement well today with NAD noted. PT verbalized understanding of discharge instructions. PT taken back to the mammogram area at this time via wheelchair with ice pack given.

## 2020-11-01 ENCOUNTER — Other Ambulatory Visit (HOSPITAL_COMMUNITY)
Admission: RE | Admit: 2020-11-01 | Discharge: 2020-11-01 | Disposition: A | Discharge status: Home / Self Care | Payer: Medicare HMO | Source: Ambulatory Visit | Attending: General Surgery | Admitting: General Surgery

## 2020-11-01 ENCOUNTER — Encounter (HOSPITAL_COMMUNITY): Payer: Self-pay

## 2020-11-01 ENCOUNTER — Other Ambulatory Visit: Payer: Self-pay

## 2020-11-01 ENCOUNTER — Encounter (HOSPITAL_COMMUNITY)
Admission: RE | Admit: 2020-11-01 | Discharge: 2020-11-01 | Disposition: A | Discharge status: Home / Self Care | Payer: Medicare HMO | Source: Ambulatory Visit | Attending: General Surgery | Admitting: General Surgery

## 2020-11-01 DIAGNOSIS — Z20822 Contact with and (suspected) exposure to covid-19: Secondary | ICD-10-CM | POA: Diagnosis not present

## 2020-11-01 DIAGNOSIS — Z01818 Encounter for other preprocedural examination: Secondary | ICD-10-CM | POA: Insufficient documentation

## 2020-11-01 HISTORY — DX: Hypothyroidism, unspecified: E03.9

## 2020-11-01 LAB — CBC WITH DIFFERENTIAL/PLATELET
Abs Immature Granulocytes: 0.01 10*3/uL (ref 0.00–0.07)
Basophils Absolute: 0 10*3/uL (ref 0.0–0.1)
Basophils Relative: 1 %
Eosinophils Absolute: 0.1 10*3/uL (ref 0.0–0.5)
Eosinophils Relative: 4 %
HCT: 43 % (ref 36.0–46.0)
Hemoglobin: 13.4 g/dL (ref 12.0–15.0)
Immature Granulocytes: 0 %
Lymphocytes Relative: 34 %
Lymphs Abs: 1.1 10*3/uL (ref 0.7–4.0)
MCH: 28.4 pg (ref 26.0–34.0)
MCHC: 31.2 g/dL (ref 30.0–36.0)
MCV: 91.1 fL (ref 80.0–100.0)
Monocytes Absolute: 0.3 10*3/uL (ref 0.1–1.0)
Monocytes Relative: 9 %
Neutro Abs: 1.7 10*3/uL (ref 1.7–7.7)
Neutrophils Relative %: 52 %
Platelets: 188 10*3/uL (ref 150–400)
RBC: 4.72 MIL/uL (ref 3.87–5.11)
RDW: 12.5 % (ref 11.5–15.5)
WBC: 3.2 10*3/uL — ABNORMAL LOW (ref 4.0–10.5)
nRBC: 0 % (ref 0.0–0.2)

## 2020-11-01 LAB — BASIC METABOLIC PANEL
Anion gap: 10 (ref 5–15)
BUN: 13 mg/dL (ref 8–23)
CO2: 25 mmol/L (ref 22–32)
Calcium: 9.4 mg/dL (ref 8.9–10.3)
Chloride: 102 mmol/L (ref 98–111)
Creatinine, Ser: 0.73 mg/dL (ref 0.44–1.00)
GFR, Estimated: >60 mL/min (ref >60)
Glucose, Bld: 82 mg/dL (ref 70–99)
Potassium: 3.9 mmol/L (ref 3.5–5.1)
Sodium: 137 mmol/L (ref 135–145)

## 2020-11-02 LAB — SARS CORONAVIRUS 2 (TAT 6-24 HRS): SARS Coronavirus 2: NEGATIVE

## 2020-11-02 NOTE — Progress Notes (Signed)
Talked with pt. Pt aware that she has to be at hospital at 0750 in the morning for her procedure. Voiced understanding.

## 2020-11-03 ENCOUNTER — Encounter (HOSPITAL_COMMUNITY)
Admission: RE | Disposition: A | Discharge status: Home / Self Care | Payer: Self-pay | Source: Home / Self Care | Attending: General Surgery

## 2020-11-03 ENCOUNTER — Ambulatory Visit (HOSPITAL_COMMUNITY): Payer: Medicare HMO

## 2020-11-03 ENCOUNTER — Ambulatory Visit (HOSPITAL_COMMUNITY)
Admission: RE | Admit: 2020-11-03 | Discharge: 2020-11-03 | Disposition: A | Discharge status: Home / Self Care | Payer: Medicare HMO | Attending: General Surgery | Admitting: General Surgery

## 2020-11-03 ENCOUNTER — Encounter (HOSPITAL_COMMUNITY): Payer: Self-pay | Admitting: General Surgery

## 2020-11-03 ENCOUNTER — Ambulatory Visit (HOSPITAL_COMMUNITY): Payer: Medicare HMO | Admitting: Certified Registered Nurse Anesthetist

## 2020-11-03 DIAGNOSIS — Z79899 Other long term (current) drug therapy: Secondary | ICD-10-CM | POA: Diagnosis not present

## 2020-11-03 DIAGNOSIS — Z87891 Personal history of nicotine dependence: Secondary | ICD-10-CM | POA: Diagnosis not present

## 2020-11-03 DIAGNOSIS — Z7982 Long term (current) use of aspirin: Secondary | ICD-10-CM | POA: Diagnosis not present

## 2020-11-03 DIAGNOSIS — Z853 Personal history of malignant neoplasm of breast: Secondary | ICD-10-CM | POA: Insufficient documentation

## 2020-11-03 DIAGNOSIS — Z96651 Presence of right artificial knee joint: Secondary | ICD-10-CM | POA: Diagnosis not present

## 2020-11-03 DIAGNOSIS — Z79890 Hormone replacement therapy (postmenopausal): Secondary | ICD-10-CM | POA: Diagnosis not present

## 2020-11-03 DIAGNOSIS — Z923 Personal history of irradiation: Secondary | ICD-10-CM | POA: Insufficient documentation

## 2020-11-03 DIAGNOSIS — Z170 Estrogen receptor positive status [ER+]: Secondary | ICD-10-CM | POA: Diagnosis not present

## 2020-11-03 DIAGNOSIS — N6489 Other specified disorders of breast: Secondary | ICD-10-CM | POA: Diagnosis not present

## 2020-11-03 DIAGNOSIS — C50911 Malignant neoplasm of unspecified site of right female breast: Secondary | ICD-10-CM | POA: Diagnosis not present

## 2020-11-03 DIAGNOSIS — G20 Parkinson's disease: Secondary | ICD-10-CM | POA: Diagnosis not present

## 2020-11-03 DIAGNOSIS — R928 Other abnormal and inconclusive findings on diagnostic imaging of breast: Secondary | ICD-10-CM | POA: Diagnosis not present

## 2020-11-03 DIAGNOSIS — C50411 Malignant neoplasm of upper-outer quadrant of right female breast: Secondary | ICD-10-CM | POA: Insufficient documentation

## 2020-11-03 HISTORY — PX: MASTECTOMY, PARTIAL: SHX709

## 2020-11-03 SURGERY — MASTECTOMY PARTIAL
Anesthesia: General | Site: Breast | Laterality: Right

## 2020-11-03 MED ORDER — ONDANSETRON HCL 4 MG/2ML IJ SOLN
INTRAMUSCULAR | Status: AC
  Filled 2020-11-03: qty 2

## 2020-11-03 MED ORDER — 0.9 % SODIUM CHLORIDE (POUR BTL) OPTIME
TOPICAL | Status: DC | PRN
  Administered 2020-11-03: 1000 mL

## 2020-11-03 MED ORDER — KETOROLAC TROMETHAMINE 30 MG/ML IJ SOLN: 15.0000 mg | Freq: Once | INTRAMUSCULAR | Status: DC

## 2020-11-03 MED ORDER — BUPIVACAINE HCL (PF) 0.5 % IJ SOLN
INTRAMUSCULAR | Status: DC | PRN
  Administered 2020-11-03: 10 mL

## 2020-11-03 MED ORDER — LIDOCAINE HCL (PF) 2 % IJ SOLN
INTRAMUSCULAR | Status: AC
  Filled 2020-11-03: qty 5

## 2020-11-03 MED ORDER — PROPOFOL 10 MG/ML IV BOLUS
INTRAVENOUS | Status: DC | PRN
  Administered 2020-11-03: 90 mg via INTRAVENOUS

## 2020-11-03 MED ORDER — FENTANYL CITRATE (PF) 100 MCG/2ML IJ SOLN: 25.0000 ug | INTRAMUSCULAR | Status: DC | PRN

## 2020-11-03 MED ORDER — CHLORHEXIDINE GLUCONATE CLOTH 2 % EX PADS: 6.0000 | MEDICATED_PAD | Freq: Once | CUTANEOUS | Status: DC

## 2020-11-03 MED ORDER — LIDOCAINE HCL (CARDIAC) PF 100 MG/5ML IV SOSY
PREFILLED_SYRINGE | INTRAVENOUS | Status: DC | PRN
  Administered 2020-11-03: 60 mg via INTRATRACHEAL

## 2020-11-03 MED ORDER — CEFAZOLIN SODIUM-DEXTROSE 2-4 GM/100ML-% IV SOLN
INTRAVENOUS | Status: AC
  Filled 2020-11-03: qty 100

## 2020-11-03 MED ORDER — CHLORHEXIDINE GLUCONATE 0.12 % MT SOLN
OROMUCOSAL | Status: AC
  Filled 2020-11-03: qty 15

## 2020-11-03 MED ORDER — FENTANYL CITRATE (PF) 100 MCG/2ML IJ SOLN
INTRAMUSCULAR | Status: DC | PRN
  Administered 2020-11-03 (×2): 25 ug via INTRAVENOUS

## 2020-11-03 MED ORDER — BUPIVACAINE HCL (PF) 0.5 % IJ SOLN
INTRAMUSCULAR | Status: AC
  Filled 2020-11-03: qty 30

## 2020-11-03 MED ORDER — ONDANSETRON HCL 4 MG/2ML IJ SOLN: 4.0000 mg | Freq: Once | INTRAMUSCULAR | Status: DC | PRN

## 2020-11-03 MED ORDER — EPHEDRINE SULFATE 50 MG/ML IJ SOLN
INTRAMUSCULAR | Status: DC | PRN
  Administered 2020-11-03: 5 mg via INTRAVENOUS

## 2020-11-03 MED ORDER — LACTATED RINGERS IV SOLN: INTRAVENOUS | Status: DC | PRN

## 2020-11-03 MED ORDER — FENTANYL CITRATE (PF) 250 MCG/5ML IJ SOLN
INTRAMUSCULAR | Status: AC
  Filled 2020-11-03: qty 5

## 2020-11-03 MED ORDER — CEFAZOLIN SODIUM-DEXTROSE 2-4 GM/100ML-% IV SOLN
2.0000 g | INTRAVENOUS | Status: AC
  Administered 2020-11-03: 2 g via INTRAVENOUS

## 2020-11-03 MED ORDER — ONDANSETRON HCL 4 MG/2ML IJ SOLN
INTRAMUSCULAR | Status: DC | PRN
  Administered 2020-11-03: 4 mg via INTRAVENOUS

## 2020-11-03 SURGICAL SUPPLY — 33 items
ADH SKN CLS APL DERMABOND .7 (GAUZE/BANDAGES/DRESSINGS) ×2
APL PRP STRL LF DISP 70% ISPRP (MISCELLANEOUS) ×2
BLADE SURG 15 STRL LF DISP TIS (BLADE) ×2 IMPLANT
BLADE SURG 15 STRL SS (BLADE) ×3
CHLORAPREP W/TINT 26 (MISCELLANEOUS) ×3 IMPLANT
CLOTH BEACON ORANGE TIMEOUT ST (SAFETY) ×3 IMPLANT
COVER LIGHT HANDLE STERIS (MISCELLANEOUS) ×6 IMPLANT
COVER WAND RF STERILE (DRAPES) ×3 IMPLANT
DECANTER SPIKE VIAL GLASS SM (MISCELLANEOUS) ×3 IMPLANT
DERMABOND ADVANCED (GAUZE/BANDAGES/DRESSINGS) ×1
DERMABOND ADVANCED .7 DNX12 (GAUZE/BANDAGES/DRESSINGS) ×2 IMPLANT
DEVICE DUBIN SPECIMEN MAMMOGRA (MISCELLANEOUS) ×2 IMPLANT
ELECT REM PT RETURN 9FT ADLT (ELECTROSURGICAL) ×3
ELECTRODE REM PT RTRN 9FT ADLT (ELECTROSURGICAL) ×2 IMPLANT
GLOVE SURG SS PI 7.5 STRL IVOR (GLOVE) ×3 IMPLANT
GLOVE SURG UNDER POLY LF SZ7 (GLOVE) ×6 IMPLANT
GOWN STRL REUS W/TWL LRG LVL3 (GOWN DISPOSABLE) ×6 IMPLANT
KIT TURNOVER KIT A (KITS) ×3 IMPLANT
MANIFOLD NEPTUNE II (INSTRUMENTS) ×3 IMPLANT
NDL HYPO 25X1 1.5 SAFETY (NEEDLE) ×1 IMPLANT
NEEDLE HYPO 25X1 1.5 SAFETY (NEEDLE) ×3 IMPLANT
NS IRRIG 1000ML POUR BTL (IV SOLUTION) ×3 IMPLANT
PACK MINOR (CUSTOM PROCEDURE TRAY) ×3 IMPLANT
PAD ARMBOARD 7.5X6 YLW CONV (MISCELLANEOUS) ×5 IMPLANT
PENCIL SMOKE EVACUATOR (MISCELLANEOUS) ×3 IMPLANT
SET BASIN LINEN APH (SET/KITS/TRAYS/PACK) ×3 IMPLANT
SET LOCALIZER 20 PROBE US (MISCELLANEOUS) ×3 IMPLANT
SPONGE LAP 18X18 RF (DISPOSABLE) ×3 IMPLANT
SUT MNCRL AB 4-0 PS2 18 (SUTURE) ×3 IMPLANT
SUT SILK 2 0 SH (SUTURE) ×2 IMPLANT
SUT VIC AB 3-0 SH 27 (SUTURE) ×3
SUT VIC AB 3-0 SH 27X BRD (SUTURE) ×2 IMPLANT
SYR CONTROL 10ML LL (SYRINGE) ×3 IMPLANT

## 2020-11-03 NOTE — Anesthesia Procedure Notes (Signed)
Procedure Name: LMA Insertion Date/Time: 11/03/2020 9:01 AM Performed by: Hewitt Blade, CRNA Pre-anesthesia Checklist: Patient identified, Emergency Drugs available, Suction available and Patient being monitored Patient Re-evaluated:Patient Re-evaluated prior to induction Oxygen Delivery Method: Circle system utilized Preoxygenation: Pre-oxygenation with 100% oxygen Induction Type: IV induction Ventilation: Mask ventilation without difficulty LMA: LMA inserted LMA Size: 4.0 Number of attempts: 1 Placement Confirmation: positive ETCO2 and breath sounds checked- equal and bilateral Tube secured with: Tape Dental Injury: Teeth and Oropharynx as per pre-operative assessment

## 2020-11-03 NOTE — Anesthesia Postprocedure Evaluation (Signed)
Anesthesia Post Note  Patient: Lisa Torres  Procedure(s) Performed: RIGHT PARTIAL MASTECTOMY AFTER TAG PLACEMENT (Right Breast)  Anesthesia Type: General Anesthetic complications: no   No complications documented.   Last Vitals:  Vitals:   11/03/20 1045 11/03/20 1058  BP: (!) 176/65 (!) 164/80  Pulse: 63 72  Resp: 11 16  Temp:  36.8 C  SpO2: 96% 100%    Last Pain:  Vitals:   11/03/20 1058  TempSrc: Oral  PainSc: 0-No pain                 Louann Sjogren

## 2020-11-03 NOTE — Interval H&P Note (Signed)
History and Physical Interval Note:  11/03/2020 8:21 AM  Lisa Torres  has presented today for surgery, with the diagnosis of Right Breast Cancer.  The various methods of treatment have been discussed with the patient and family. After consideration of risks, benefits and other options for treatment, the patient has consented to  Procedure(s): MASTECTOMY PARTIAL (Right) BREAST LUMPECTOMY WITH RADIOFREQUENCY TAG IDENTIFICATION (Right) as a surgical intervention.  The patient's history has been reviewed, patient examined, no change in status, stable for surgery.  I have reviewed the patient's chart and labs.  Questions were answered to the patient's satisfaction.     Aviva Signs

## 2020-11-03 NOTE — Anesthesia Preprocedure Evaluation (Addendum)
Anesthesia Evaluation  Patient identified by MRN, date of birth, ID band Patient awake    Reviewed: Allergy & Precautions, H&P , NPO status , Patient's Chart, lab work & pertinent test results, reviewed documented beta blocker date and time   Airway Mallampati: II  TM Distance: >3 FB Neck ROM: full    Dental no notable dental hx.    Pulmonary neg pulmonary ROS, former smoker,    Pulmonary exam normal breath sounds clear to auscultation       Cardiovascular Exercise Tolerance: Good hypertension, negative cardio ROS   Rhythm:regular Rate:Normal     Neuro/Psych negative neurological ROS  negative psych ROS   GI/Hepatic Neg liver ROS, GERD  Medicated,  Endo/Other  Hypothyroidism   Renal/GU negative Renal ROS  negative genitourinary   Musculoskeletal  (+) Arthritis ,   Abdominal   Peds  Hematology negative hematology ROS (+)   Anesthesia Other Findings   Reproductive/Obstetrics negative OB ROS                            Anesthesia Physical Anesthesia Plan  ASA: II  Anesthesia Plan: General and General LMA   Post-op Pain Management:    Induction:   PONV Risk Score and Plan: Ondansetron  Airway Management Planned:   Additional Equipment:   Intra-op Plan:   Post-operative Plan:   Informed Consent: I have reviewed the patients History and Physical, chart, labs and discussed the procedure including the risks, benefits and alternatives for the proposed anesthesia with the patient or authorized representative who has indicated his/her understanding and acceptance.     Dental Advisory Given  Plan Discussed with: CRNA  Anesthesia Plan Comments:        Anesthesia Quick Evaluation

## 2020-11-03 NOTE — Op Note (Signed)
Patient:  Lisa Torres  DOB:  12-04-1945  MRN:  [id number]   Preop Diagnosis: Invasive ductal carcinoma of right breast  Postop Diagnosis: Same  Procedure: Right partial mastectomy after radiofrequency tag placement  Surgeon: Aviva Signs, MD  Anes: General  Indications: Patient is a 75 year old black female with a history of a left breast carcinoma who presents with a 1.7 cm invasive ductal carcinoma of the right breast.  After discussion with oncology, it was agreed that the patient would proceed with a right partial mastectomy after radiofrequency tag placement.  Risks and benefits of the procedure including bleeding, infection, and unclear margins were fully explained to the patient, who gave informed consent.  Procedure note: The patient was placed in supine position.  After general anesthesia was administered, the right breast was prepped and draped using the usual sterile technique with ChloraPrep.  Surgical site confirmation was performed.  The radiofrequency tag was noted at the 10 o'clock position in the upper, outer quadrant of the right breast.  A curvilinear incision was made.  The Hologic localizer was then used to identify the area that need to be removed.  A right partial mastectomy was then performed.  A short suture was placed superiorly and a long suture placed laterally for orientation purposes.  Specimen radiography revealed the tag as well as the clip to be in the center of the specimen.  It was then sent to pathology for an examination.  A bleeding was controlled using Bovie left cautery.  0.5% Sensorcaine was instilled into the surrounding wound.  The subcutaneous layer was reapproximated using 3-0 Vicryl interrupted suture.  The skin was closed using a 4-0 Monocryl subcuticular suture.  Dermabond was applied.  All tape and needle counts were correct at the end of the procedure.  Patient was awakened and transferred to PACU in stable condition.  Complications:  None  EBL: Minimal  Specimen: Right breast tissue

## 2020-11-03 NOTE — Transfer of Care (Signed)
Immediate Anesthesia Transfer of Care Note  Patient: Lisa Torres  Procedure(s) Performed: RIGHT PARTIAL MASTECTOMY AFTER TAG PLACEMENT (Right Breast)  Patient Location: PACU  Anesthesia Type:General  Level of Consciousness: awake  Airway & Oxygen Therapy: Patient Spontanous Breathing  Post-op Assessment: Report given to RN and Post -op Vital signs reviewed and stable  Post vital signs: Reviewed and stable  Last Vitals:  Vitals Value Taken Time  BP 162/69 11/03/20 1002  Temp 36.4 C 11/03/20 1002  Pulse 65 11/03/20 1007  Resp 12 11/03/20 1008  SpO2 98 % 11/03/20 1007  Vitals shown include unvalidated device data.  Last Pain:  Vitals:   11/03/20 0758  PainSc: 0-No pain         Complications: No complications documented.

## 2020-11-03 NOTE — Discharge Instructions (Signed)
Lumpectomy, Care After This sheet gives you information about how to care for yourself after your procedure. Your health care provider may also give you more specific instructions. If you have problems or questions, contact your health care provider. What can I expect after the procedure? After the procedure, it is common to have:  Breast swelling.  Breast tenderness.  Stiffness in your arm or shoulder.  A change in the shape and feel of your breast.  Scar tissue that feels hard to the touch in the area where the lump was removed. Follow these instructions at home: Medicines  Take over-the-counter and prescription medicines only as told by your health care provider.  If you were prescribed an antibiotic medicine, take it as told by your health care provider. Do not stop taking the antibiotic even if you start to feel better.  Ask your health care provider if the medicine prescribed to you: ? Requires you to avoid driving or using heavy machinery. ? Can cause constipation. You may need to take these actions to prevent or treat constipation:  Drink enough fluid to keep your urine pale yellow.  Take over-the-counter or prescription medicines.  Eat foods that are high in fiber, such as beans, whole grains, and fresh fruits and vegetables.  Limit foods that are high in fat and processed sugars, such as fried or sweet foods. Incision care  Follow instructions from your health care provider about how to take care of your incision. Make sure you: ? Wash your hands with soap and water before and after you change your bandage (dressing). If soap and water are not available, use hand sanitizer. ? Change your dressing as told by your health care provider. ? Leave stitches (sutures), skin glue, or adhesive strips in place. These skin closures may need to stay in place for 2 weeks or longer. If adhesive strip edges start to loosen and curl up, you may trim the loose edges. Do not remove  adhesive strips completely unless your health care provider tells you to do that.  Check your incision area every day for signs of infection. Check for: ? More redness, swelling, or pain. ? Fluid or blood. ? Warmth. ? Pus or a bad smell.  Keep your dressing clean and dry.  If you were sent home with a surgical drain in place, follow instructions from your health care provider about emptying it.      Bathing  Do not take baths, swim, or use a hot tub until your health care provider approves.  Ask your health care provider if you may take showers. You may only be allowed to take sponge baths. Activity  Rest as told by your health care provider.  Avoid sitting for a long time without moving. Get up to take short walks every 1-2 hours. This is important to improve blood flow and breathing. Ask for help if you feel weak or unsteady.  Return to your normal activities as told by your health care provider. Ask your health care provider what activities are safe for you.  Be careful to avoid any activities that could cause an injury to your arm on the side of your surgery.  Do not lift anything that is heavier than 10 lb (4.5 kg), or the limit that you are told, until your health care provider says that it is safe. Avoid lifting with the arm that is on the side of your surgery.  Do not carry heavy objects on your shoulder on the side of   your surgery.  Do exercises to keep your shoulder and arm from getting stiff and swollen. Talk with your health care provider about which exercises are safe for you. General instructions  Wear a supportive bra as told by your health care provider.  Raise (elevate) your arm above the level of your heart while you are sitting or lying down.  Do not wear tight jewelry on your arm, wrist, or fingers on the side of your surgery.  Keep all follow-up visits as told by your health care provider. This is important. ? You may need to be screened for extra fluid  around the lymph nodes and swelling in the breast and arm (lymphedema). Follow instructions from your health care provider about how often you should be checked.  If you had any lymph nodes removed during your procedure, be sure to tell all of your health care providers. This is important information to share before you are involved in certain procedures, such as having blood tests or having your blood pressure taken. Contact a health care provider if:  You develop a rash.  You have a fever.  Your pain medicine is not working.  You have swelling, weakness, or numbness in your arm that does not improve after a few weeks.  You have new swelling in your breast.  You have any of these signs of infection: ? More redness, swelling, or pain in your incision area. ? Fluid or blood coming from your incision. ? Warmth coming from the incision area. ? Pus or a bad smell coming from your incision. Get help right away if you have:  Very bad pain in your breast or arm.  Swelling in your legs or arms.  Redness, warmth, or pain in your leg or arm.  Chest pain.  Difficulty breathing. Summary  After the procedure, it is common to have breast tenderness, swelling in your breast, and stiffness in your arm and shoulder.  Follow instructions from your health care provider about how to take care of your incision.  Do not lift anything that is heavier than 10 lb (4.5 kg), or the limit that you are told, until your health care provider says that it is safe. Avoid lifting with the arm that is on the side of your surgery.  If you had any lymph nodes removed during your procedure, be sure to tell all of your health care providers. This is important information to share before you are involved in certain procedures, such as having blood tests or having your blood pressure taken. This information is not intended to replace advice given to you by your health care provider. Make sure you discuss any  questions you have with your health care provider. Document Revised: 12/07/2018 Document Reviewed: 12/07/2018 Elsevier Patient Education  2021 [employer]  

## 2020-11-06 ENCOUNTER — Encounter (HOSPITAL_COMMUNITY): Payer: Self-pay | Admitting: General Surgery

## 2020-11-06 LAB — SURGICAL PATHOLOGY

## 2020-11-09 ENCOUNTER — Ambulatory Visit (INDEPENDENT_AMBULATORY_CARE_PROVIDER_SITE_OTHER): Payer: Medicare HMO | Admitting: General Surgery

## 2020-11-09 ENCOUNTER — Other Ambulatory Visit: Payer: Self-pay

## 2020-11-09 ENCOUNTER — Encounter: Payer: Self-pay | Admitting: General Surgery

## 2020-11-09 VITALS — BP 157/82 | HR 59 | Temp 98.4°F | Resp 12 | Ht 61.5 in | Wt 165.0 lb

## 2020-11-09 DIAGNOSIS — Z09 Encounter for follow-up examination after completed treatment for conditions other than malignant neoplasm: Secondary | ICD-10-CM

## 2020-11-09 NOTE — Progress Notes (Signed)
Subjective:     Lisa Torres  Here for postoperative check, status post right partial mastectomy.  Patient doing well.  She has no complaints. Objective:    BP (!) 157/82   Pulse (!) 59   Temp 98.4 F (36.9 C) (Other (Comment))   Resp 12   Ht 5' 1.5" (1.562 m)   Wt 165 lb (74.8 kg)   SpO2 94%   BMI 30.67 kg/m   General:  alert, cooperative and no distress  Right breast incision healing well.    Results Surgical pathology (Order [id number])  MyChart Results Release  MyChart Status: Pending Results Release    In Basket Actions   Reviewed  Result Note  View in In Basket     Surgical pathology Order: [id number]  Status: Edited Result - FINAL    Visible to patient: No (inaccessible in MyChart)    Next appt: 11/20/2020 at 10:00 AM in Radiology ([facility]EX[facility])    0 Result Notes   Component 6 d ago  SURGICAL PATHOLOGY SURGICAL PATHOLOGY  CASE: [id number]  PATIENT: Lisa Torres  Surgical Pathology Report      Clinical History: right breast cancer      FINAL MICROSCOPIC DIAGNOSIS:   A. BREAST, RIGHT LUMPECTOMY:  - Invasive ductal carcinoma, 1.6 cm, grade 1 with calcifications  - Ductal carcinoma in situ, intermediate grade  - Resection margins are negative for carcinoma; closest is the superior  margin at less than 1 mm  - Biopsy site changes  - See oncology table       ONCOLOGY TABLE:   INVASIVE CARCINOMA OF THE BREAST: Resection   Procedure: Lumpectomy  Specimen Laterality: Right  Histologic Type: Invasive ductal carcinoma  Histologic Grade:    Glandular (Acinar)/Tubular Differentiation: 1    Nuclear Pleomorphism: 2    Mitotic Rate: 1    Overall Grade: 1  Tumor Size: 1.6 cm  Ductal Carcinoma In Situ: Present, intermediate grade  Treatment Effect in the Breast: No known presurgical therapy  Margins: All margins negative for invasive carcinoma    Distance from Closest Margin (mm): Less than 1 mm     Specify Closest Margin (required only if <38m): Superior margin  DCIS Margins: Uninvolved by DCIS    Distance from Closest Margin (mm): 2 mm    Specify Closest Margin (required only if <152m: Inferior margin  Regional Lymph Nodes: Not applicable (no lymph nodes submitted or found)  Distant Metastasis:    Distant Site(s) Involved: Not applicable  Breast Biomarker Testing Performed on Previous Biopsy:    Testing Performed on Case Number: [id number]       Estrogen Receptor: 95%, positive, strong staining intensity       Progesterone Receptor: 90%, positive, strong staining  intensity       HER2: Negative (FISH)       Ki-67: 5%  Pathologic Stage Classification (pTNM, AJCC 8th Edition): pT1c, pN not  assigned  Representative Tumor Block: 1B  Comment(s): None   ([id number])      GROSS DESCRIPTION:   Specimen type: Right breast lumpectomy, in formalin at 0948 hours  Size: 5.8 cm from medial to lateral, 5.3 cm from anterior to posterior,  and ranges from 1.4 cm to 2.3 cm from superior to inferior.  Orientation: Black anterior, Blue Inferior, Orange Lateral, Yellow  Medial, Green posterior, Red Superior.  Localized area: None  Cut surface: There is a 1.6 x 1.4 x 1.1 cm tan-white firm ill-defined  mass which contains a clip.  Margins: The mass abuts the superior and inferior margins at the  thinnest portion of the specimen, and is 1 cm or greater from remaining  margins.  Prognostic indicators: Not taken at gross  Block summary:  Block 1 = mass, without margin  Block 2 = mass abutting superior margin  Blocks 3, 4 = mass abutting inferior margin  Block 5 = posterior margin nearest mass  Block 6 = anterior margin nearest mass  Block 7 = medial margin nearest mass  Block 8 = lateral margin nearest mass   SW 11/03/2020     Final Diagnosis performed by Jaquita Folds, MD.  Electronically  signed 11/06/2020  Technical component performed  at [facility], [place]  [address], [place], [place] [address].  Professional component performed at [facility],  [place] [address] [place], [place] [address].  Immunohistochemistry Technical component (if applicable) was performed  at [facility]. [address] [place],  [place], [place] [address].  IMMUNOHISTOCHEMISTRY DISCLAIMER (if applicable):  Some of these immunohistochemical stains may have been developed and the  performance characteristics determine by [facility]. Some  may not have been cleared or approved by the U.S. Food and Drug  Administration. The FDA has determined that such clearance or approval  is not necessary. This test is used for clinical purposes. It should not  be regarded as investigational or for research. This laboratory is  certified under the [place]  (CLIA-88) as qualified to perform high complexity clinical laboratory  testing. The controls stained appropriately.   Resulting Agency [facility]          Specimen Collected: 11/03/20 09:30 Last Resulted: 11/06/20 09:36      Lab Flowsheet     Order Details     View Encounter     Lab and Collection Details     Routing     Result History         Linked Documents  View Image     Result Care Coordination    Patient Communication   Add Comments  Not seen Back to Top        Result Information  Status Priority Source  Edited Result - FINAL (11/06/2020 8882) Timed PATH Breast other   Authorizing Provider Information  Name: Aviva Signs, MD Fax: [phone]  Phone: [phone] Pager:    Status of Active Orders View All Orders From This Encounter Expected   Increase activity slowly [[id number] Custom] 11/03/20  Diet - low sodium heart healthy [DIET9 Custom] 11/03/20  Increase activity slowly [[id number] Custom] 11/03/20  Diet - low sodium heart healthy  [DIET9 Custom] 11/03/20    Surgical pathology: Patient Communication   Add Comments  Not seen    Cervical Cancer Screening - Results and Follow-ups Selected result Result date Tests and Procedures Follow-ups   11/03/2020  Surgical pathology   Cervical Cancer Screening History Report  View SmartLink Info  Surgical pathology (Order #[id number]) on 11/03/20         Assessment:    Doing well postoperatively.    Plan:   Patient pleased with results.  She is already scheduled to have follow-up with Dr. Delton Coombes.  Follow-up here as needed.

## 2020-11-20 ENCOUNTER — Ambulatory Visit (HOSPITAL_COMMUNITY)
Admission: RE | Admit: 2020-11-20 | Discharge: 2020-11-20 | Disposition: A | Discharge status: Home / Self Care | Payer: Medicare HMO | Source: Ambulatory Visit | Attending: Hematology | Admitting: Hematology

## 2020-11-20 ENCOUNTER — Other Ambulatory Visit (HOSPITAL_COMMUNITY): Payer: Medicare HMO

## 2020-11-20 DIAGNOSIS — Z780 Asymptomatic menopausal state: Secondary | ICD-10-CM | POA: Diagnosis not present

## 2020-11-20 DIAGNOSIS — Z170 Estrogen receptor positive status [ER+]: Secondary | ICD-10-CM | POA: Diagnosis not present

## 2020-11-20 DIAGNOSIS — C50411 Malignant neoplasm of upper-outer quadrant of right female breast: Secondary | ICD-10-CM | POA: Diagnosis not present

## 2020-11-28 ENCOUNTER — Ambulatory Visit (HOSPITAL_COMMUNITY): Payer: Medicare HMO | Admitting: Hematology

## 2020-11-29 ENCOUNTER — Inpatient Hospital Stay (HOSPITAL_COMMUNITY): Payer: Medicare HMO

## 2020-12-05 ENCOUNTER — Inpatient Hospital Stay (HOSPITAL_COMMUNITY): Payer: Medicare HMO | Attending: Hematology and Oncology | Admitting: Hematology and Oncology

## 2020-12-05 ENCOUNTER — Encounter (HOSPITAL_COMMUNITY): Payer: Self-pay | Admitting: Hematology and Oncology

## 2020-12-05 ENCOUNTER — Other Ambulatory Visit: Payer: Self-pay

## 2020-12-05 VITALS — BP 160/67 | HR 71 | Temp 97.0°F | Resp 20

## 2020-12-05 DIAGNOSIS — Z170 Estrogen receptor positive status [ER+]: Secondary | ICD-10-CM | POA: Diagnosis not present

## 2020-12-05 DIAGNOSIS — Z853 Personal history of malignant neoplasm of breast: Secondary | ICD-10-CM | POA: Diagnosis not present

## 2020-12-05 DIAGNOSIS — Z87891 Personal history of nicotine dependence: Secondary | ICD-10-CM | POA: Diagnosis not present

## 2020-12-05 DIAGNOSIS — C50411 Malignant neoplasm of upper-outer quadrant of right female breast: Secondary | ICD-10-CM | POA: Diagnosis not present

## 2020-12-05 DIAGNOSIS — Z993 Dependence on wheelchair: Secondary | ICD-10-CM | POA: Insufficient documentation

## 2020-12-05 DIAGNOSIS — Z923 Personal history of irradiation: Secondary | ICD-10-CM | POA: Diagnosis not present

## 2020-12-05 MED ORDER — HYDROCORTISONE 0.5 % EX CREA: 1.0000 "application " | TOPICAL_CREAM | Freq: Two times a day (BID) | CUTANEOUS | 0 refills | Status: DC

## 2020-12-05 MED ORDER — LETROZOLE 2.5 MG PO TABS: 2.5000 mg | ORAL_TABLET | Freq: Every day | ORAL | 1 refills | Status: DC

## 2020-12-05 MED ORDER — CALCIUM CARBONATE-VITAMIN D 500-200 MG-UNIT PO TABS: 1.0000 | ORAL_TABLET | Freq: Every day | ORAL | 1 refills | Status: AC

## 2020-12-05 NOTE — Progress Notes (Signed)
[facility] [address], [place] [address]   Patient Care Team: Sasser, Silvestre Moment, MD as PCP - General (Cardiology) Harl Bowie Alphonse Guild, MD as PCP - Cardiology (Cardiology) Brien Mates, RN as Oncology Nurse Navigator (Oncology)   Interval History: Lisa Torres 75 y.o. female is here because of recent diagnosis of right breast cancer, ER+/PR+/HER2 negative s/p lumpectomy.   On exam today Lisa Torres reports that she has been well overall in the interim since her last visit.  She reports that she is having some itching and red spots on her right breast near the site of the lumpectomy.  She reports that the surgery site is healing well there is been no drainage or erythema.  She reports that she otherwise has no fevers, chills, sweats, nausea, vomiting or diarrhea.  A full 10 point ROS is listed below.  The bulk of our discussion today focused on the addition of letrozole to her medication list.  She voiced understanding of the side effects this medication is willing and able to proceed at this time.  SUMMARY OF ONCOLOGIC HISTORY: Oncology History   No history exists.    In terms of breast cancer risk profile:  She menarched at early age of 85 and went to menopause at age 58 due to partial hysterectomy.  She had 3 pregnancy, her first child was born at age 43.  She received birth control pills for approximately several years.  She was never exposed to fertility medications or hormone replacement therapy.  She has positive family history of Breast/GYN/GI cancer.  MEDICAL HISTORY:  Past Medical History:  Diagnosis Date   Breast cancer ([place]) 11/2011   Chronic back pain    Chronic leg pain    bilateral   Chronic leukopenia    DDD (degenerative disc disease), cervical    DDD (degenerative disc disease), lumbar    Gait abnormality    GERD (gastroesophageal reflux disease)    Hypertension    Hypothyroidism    Parkinson's disease ([place])    Radiation  04/2012   33 treatments for breast cancer   Right knee pain    Thyroid disease     SURGICAL HISTORY: Past Surgical History:  Procedure Laterality Date   ABDOMINAL HYSTERECTOMY     partial   BREAST LUMPECTOMY Left 12/2011   CHOLECYSTECTOMY     COLONOSCOPY N/A 07/01/2013   Procedure: COLONOSCOPY;  Surgeon: Rogene Houston, MD;  [facility];  Service: Endoscopy;  Laterality: N/A;  1030   COLONOSCOPY N/A 09/15/2019   Procedure: COLONOSCOPY;  Surgeon: Rogene Houston, MD;  [facility];  Service: Endoscopy;  Laterality: N/A;  815   ESOPHAGOGASTRODUODENOSCOPY N/A 11/21/2017   Procedure: ESOPHAGOGASTRODUODENOSCOPY (EGD);  Surgeon: Rogene Houston, MD;  [facility];  Service: Endoscopy;  Laterality: N/A;  2:45   EXCISIONAL TOTAL KNEE ARTHROPLASTY WITH ANTIBIOTIC SPACERS Right 01/12/2018   Procedure: EXCISIONAL TOTAL KNEE ARTHROPLASTY WITH ANTIBIOTIC SPACERS;  Surgeon: Vickey Huger, MD;  Location: [facility];  Service: Orthopedics;  Laterality: Right;   JOINT REPLACEMENT  01/2017   right knee   KNEE SURGERY Right 01/12/2018   EXCISIONAL TOTAL KNEE ARTHROPLASTY WITH ANTIBIOTIC SPACERS   MASTECTOMY, PARTIAL Right 11/03/2020   Procedure: RIGHT PARTIAL MASTECTOMY AFTER TAG PLACEMENT;  Surgeon: Aviva Signs, MD;  Location: [facility];  Service: General;  Laterality: Right;   POLYPECTOMY  09/15/2019   Procedure: POLYPECTOMY;  Surgeon: Rogene Houston, MD;  [facility];  Service: Endoscopy;;  Cold Snare transverse colon polyp   TOTAL KNEE REVISION Right 03/23/2018   Procedure: RIGHT TOTAL KNEE REVISION;  Surgeon: Vickey Huger, MD;  [facility];  Service: Orthopedics;  Laterality: Right;   TUBAL LIGATION      SOCIAL HISTORY: Social History   Socioeconomic History   Marital status: Married    Spouse name: Marcello Moores   Number of children: 3   Years of education: 33 th   Highest education level: Not on file  Occupational History    Comment: retired  Tobacco  Use   Smoking status: Former    Packs/day: 0.50    Years: 18.00    Pack years: 9.00    Types: Cigarettes    Quit date: 09/30/1998    Years since quitting: 22.1   Smokeless tobacco: Never  Vaping Use   Vaping Use: Never used  Substance and Sexual Activity   Alcohol use: No   Drug use: No   Sexual activity: Not on file  Other Topics Concern   Not on file  Social History Narrative   Patient lives at home with her husband Marcello Moores).    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   Right handed   Social Determinants of Health   Financial Resource Strain: Low Risk    Difficulty of Paying Living Expenses: Not very hard  Food Insecurity: No Food Insecurity   Worried About Charity fundraiser in the Last Year: Never true   Arboriculturist in the Last Year: Never true  Transportation Needs: No Transportation Needs   Lack of Transportation (Medical): No   Lack of Transportation (Non-Medical): No  Physical Activity: Inactive   Days of Exercise per Week: 0 days   Minutes of Exercise per Session: 0 min  Stress: No Stress Concern Present   Feeling of Stress : Only a little  Social Connections: Engineer, building services of Communication with Friends and Family: More than three times a week   Frequency of Social Gatherings with Friends and Family: More than three times a week   Attends Religious Services: More than 4 times per year   Active Member of [employer] or Organizations: No   Attends Music therapist: More than 4 times per year   Marital Status: Married  Human resources officer Violence: Not At Risk   Fear of Current or Ex-Partner: No   Emotionally Abused: No   Physically Abused: No   Sexually Abused: No    FAMILY HISTORY: Family History  Problem Relation Age of Onset   Cancer Mother 40       colon cancer   Cancer Father 87       throat cancer   Pancreatic cancer Brother        63y 11-14-15    ALLERGIES:  is allergic to hydrocodone and tramadol.  MEDICATIONS:   Current Outpatient Medications  Medication Sig Dispense Refill   amLODipine (NORVASC) 5 MG tablet Take 5 mg by mouth daily.      aspirin 325 MG EC tablet Take 1 tablet (325 mg total) by mouth 2 (two) times daily. 30 tablet 0   B Complex Vitamins (B COMPLEX PO) Place 1 tablet under the tongue daily.     baclofen (LIORESAL) 10 MG tablet Take 10 mg by mouth at bedtime.      carbidopa-levodopa (SINEMET IR) 25-100 MG tablet Take 2 tablets by mouth 3 (three) times daily.     diazepam (  VALIUM) 5 MG tablet Take 5 mg by mouth 2 (two) times daily.     Istradefylline (NOURIANZ) 20 MG TABS Take 20 mg by mouth daily.     levothyroxine (SYNTHROID, LEVOTHROID) 100 MCG tablet Take 100 mcg by mouth daily before breakfast.     pantoprazole (PROTONIX) 40 MG tablet Take 1 tablet (40 mg total) by mouth daily before breakfast. 90 tablet 3   polyethylene glycol powder (MIRALAX) 17 GM/SCOOP powder Take 17 g by mouth daily. (Patient taking differently: Take 17 g by mouth daily as needed (constipation.).) 850 g 1   traZODone (DESYREL) 50 MG tablet Take 100 mg by mouth at bedtime.      No current facility-administered medications for this visit.    REVIEW OF SYSTEMS:   Review of Systems  Constitutional:  Positive for fatigue (50%). Negative for appetite change.  Musculoskeletal:  Positive for gait problem (d/t Parkinson's).  Neurological:  Positive for gait problem (d/t Parkinson's) and numbness (& tingling in occipital region).  All other systems reviewed and are negative.  PHYSICAL EXAMINATION: ECOG PERFORMANCE STATUS: 2 - Symptomatic, <50% confined to bed  Vitals:   12/05/20 1051  BP: (!) 160/67  Pulse: 71  Resp: 20  Temp: (!) 97 F (36.1 C)  SpO2: 99%   Filed Weights   Physical Exam Vitals reviewed.  Constitutional:      Appearance: Normal appearance.     Comments: In wheelchair  Cardiovascular:     Rate and Rhythm: Normal rate and regular rhythm.     Pulses: Normal pulses.     Heart sounds:  Normal heart sounds.  Pulmonary:     Effort: Pulmonary effort is normal.     Breath sounds: Normal breath sounds.  Chest:  Breasts:    Right: Normal. No swelling, bleeding, inverted nipple, mass, nipple discharge, skin change, tenderness, axillary adenopathy or supraclavicular adenopathy.     Left: Normal. No swelling, bleeding, inverted nipple, mass, nipple discharge, skin change (lumpectomy scar well-healed), tenderness, axillary adenopathy or supraclavicular adenopathy.  Musculoskeletal:     Right lower leg: No edema.     Left lower leg: No edema.  Lymphadenopathy:     Upper Body:     Right upper body: No supraclavicular, axillary or pectoral adenopathy.     Left upper body: No supraclavicular, axillary or pectoral adenopathy.  Neurological:     General: No focal deficit present.     Mental Status: She is alert and oriented to person, place, and time.  Psychiatric:        Mood and Affect: Mood normal.        Behavior: Behavior normal.     LABORATORY DATA:  I have reviewed the data as listed Recent Results (from the past 2160 hour(s))  SARS CORONAVIRUS 2 (TAT 6-24 HRS) Nasopharyngeal Nasopharyngeal Swab     Status: None   Collection Time: 11/01/20  1:58 PM   Specimen: Nasopharyngeal Swab  Result Value Ref Range   SARS Coronavirus 2 NEGATIVE NEGATIVE    Comment: (NOTE) SARS-CoV-2 target nucleic acids are NOT DETECTED.  The SARS-CoV-2 RNA is generally detectable in upper and lower respiratory specimens during the acute phase of infection. Negative results do not preclude SARS-CoV-2 infection, do not rule out co-infections with other pathogens, and should not be used as the sole basis for treatment or other patient management decisions. Negative results must be combined with clinical observations, patient history, and epidemiological information. The expected result is Negative.  Fact Sheet for Patients: SugarRoll.be  Fact Sheet for  Healthcare Providers: https://www.woods-mathews.com/  This test is not yet approved or cleared by the [place] FDA and  has been authorized for detection and/or diagnosis of SARS-CoV-2 by FDA under an Emergency Use Authorization (EUA). This EUA will remain  in effect (meaning this test can be used) for the duration of the COVID-19 declaration under Se ction 564(b)(1) of the Act, 21 U.S.C. section 360bbb-3(b)(1), unless the authorization is terminated or revoked sooner.  Performed at [facility], [place] [address], [place], [place] [address]   CBC with Differential/Platelet     Status: Abnormal   Collection Time: 11/01/20  3:45 PM  Result Value Ref Range   WBC 3.2 (L) 4.0 - 10.5 K/uL   RBC 4.72 3.87 - 5.11 MIL/uL   Hemoglobin 13.4 12.0 - 15.0 g/dL   HCT 43.0 36.0 - 46.0 %   MCV 91.1 80.0 - 100.0 fL   MCH 28.4 26.0 - 34.0 pg   MCHC 31.2 30.0 - 36.0 g/dL   RDW 12.5 11.5 - 15.5 %   Platelets 188 150 - 400 K/uL   nRBC 0.0 0.0 - 0.2 %   Neutrophils Relative % 52 %   Neutro Abs 1.7 1.7 - 7.7 K/uL   Lymphocytes Relative 34 %   Lymphs Abs 1.1 0.7 - 4.0 K/uL   Monocytes Relative 9 %   Monocytes Absolute 0.3 0.1 - 1.0 K/uL   Eosinophils Relative 4 %   Eosinophils Absolute 0.1 0.0 - 0.5 K/uL   Basophils Relative 1 %   Basophils Absolute 0.0 0.0 - 0.1 K/uL   Immature Granulocytes 0 %   Abs Immature Granulocytes 0.01 0.00 - 0.07 K/uL    Comment: Performed at [facility], [address], [place], [place] [address]  Basic metabolic panel     Status: None   Collection Time: 11/01/20  3:45 PM  Result Value Ref Range   Sodium 137 135 - 145 mmol/L   Potassium 3.9 3.5 - 5.1 mmol/L   Chloride 102 98 - 111 mmol/L   CO2 25 22 - 32 mmol/L   Glucose, Bld 82 70 - 99 mg/dL    Comment: Glucose reference range applies only to samples taken after fasting for at least 8 hours.   BUN 13 8 - 23 mg/dL   Creatinine, Ser 0.73 0.44 - 1.00 mg/dL   Calcium 9.4 8.9 - 10.3 mg/dL   GFR,  Estimated >60 >60 mL/min    Comment: (NOTE) Calculated using the CKD-EPI Creatinine Equation (2021)    Anion gap 10 5 - 15    Comment: Performed at [facility], [address], [place], [place] [address]  Surgical pathology     Status: None   Collection Time: 11/03/20  9:30 AM  Result Value Ref Range   SURGICAL PATHOLOGY      SURGICAL PATHOLOGY CASE: [id number] PATIENT: Loraine Grip Surgical Pathology Report     Clinical History: right breast cancer     FINAL MICROSCOPIC DIAGNOSIS:  A. BREAST, RIGHT LUMPECTOMY: - Invasive ductal carcinoma, 1.6 cm, grade 1 with calcifications - Ductal carcinoma in situ, intermediate grade - Resection margins are negative for carcinoma; closest is the superior margin at less than 1 mm - Biopsy site changes - See oncology table      ONCOLOGY TABLE:  INVASIVE CARCINOMA OF THE BREAST:  Resection  Procedure: Lumpectomy Specimen Laterality: Right Histologic Type: Invasive ductal carcinoma Histologic Grade:      Glandular (Acinar)/Tubular Differentiation: 1  Nuclear Pleomorphism: 2      Mitotic Rate: 1      Overall Grade: 1 Tumor Size: 1.6 cm Ductal Carcinoma In Situ: Present, intermediate grade Treatment Effect in the Breast: No known presurgical therapy Margins: All margins negative for invasive carcinoma      Distance from C losest Margin (mm): Less than 1 mm      Specify Closest Margin (required only if <63m): Superior margin DCIS Margins: Uninvolved by DCIS      Distance from Closest Margin (mm): 2 mm      Specify Closest Margin (required only if <148m: Inferior margin Regional Lymph Nodes: Not applicable (no lymph nodes submitted or found)  Distant Metastasis:      Distant Site(s) Involved: Not applicable Breast Biomarker Testing Performed on Previous Biopsy:      Testing Performed on Case Number: [id number]            Estrogen Receptor: 95%, positive, strong staining intensity            Progesterone  Receptor: 90%, positive, strong staining intensity            HER2: Negative (FISH)            Ki-67: 5% Pathologic Stage Classification (pTNM, AJCC 8th Edition): pT1c, pN not assigned Representative Tumor Block: 1B Comment(s): None  (v4.5.0.0)     GROSS DESCRIPTION:  Specimen type: Right breast lumpectomy, in formalin at 0948 hours Size: 5.8 cm from medial to lateral, 5.3  cm from anterior to posterior, and ranges from 1.4 cm to 2.3 cm from superior to inferior. Orientation: Black anterior, Blue Inferior, Orange Lateral, Yellow Medial, Green posterior, Red Superior. Localized area: None Cut surface: There is a 1.6 x 1.4 x 1.1 cm tan-white firm ill-defined mass which contains a clip. Margins: The mass abuts the superior and inferior margins at the thinnest portion of the specimen, and is 1 cm or greater from remaining margins. Prognostic indicators: Not taken at gross Block summary: Block 1 = mass, without margin Block 2 = mass abutting superior margin Blocks 3, 4 = mass abutting inferior margin Block 5 = posterior margin nearest mass Block 6 = anterior margin nearest mass Block 7 = medial margin nearest mass Block 8 = lateral margin nearest mass  SW 11/03/2020    Final Diagnosis performed by NiJaquita FoldsMD.   Electronically signed 11/06/2020 Technical component performed at [facility] [place] [id number] Professional component performed at [facility] [place] [address] Immunohistochemistry Technical component (if applicable) was performed at [facility]STAverill Park[place]East End[place] [id number]  IMMUNOHISTOCHEMISTRY DISCLAIMER (if applicable): Some of these immunohistochemical stains may have been developed and the performance characteristics determine by GrSt [facility] HospitalSome may not have been cleared or approved by the U.S.  Food and Drug Administration. The FDA has determined that such clearance or approval is not necessary. This test is used for clinical purposes. It should not be regarded as investigational or for research. This laboratory is certified under the ClCoalmontCLIA-88) as qualified to perform high complexity clinical laboratory testing.  The controls stained appropriately.     Surgical pathology (Accession [id number] 09/07/2020: Right breast biopsy: invasive ductal carcinoma, ER/PR positive, HER-2 negative, Ki-67 5%.  RADIOGRAPHIC STUDIES: I have personally reviewed the radiological reports and agreed with the findings in the report. DG  Bone Density  Result Date: 11/20/2020 EXAM: DUAL X-RAY ABSORPTIOMETRY (DXA) FOR BONE MINERAL DENSITY IMPRESSION: Your patient Sondi Desch completed a BMD test on 11/20/2020 using the [place] (software version: 14.10) manufactured by [employer]. The following summarizes the results of our evaluation. Technologist:AMR PATIENT BIOGRAPHICAL: Name: Biannca, Scantlin Patient ID: [id number] Birth Date: [birth year] Height: 61.5 in. Gender: Female Exam Date: 11/20/2020 Weight: 165.0 lbs. Indications: Height Loss, Hx Breast Ca, Low Calcium Intake, Partial Hysterectomy, Post Menopausal, Unilateral oophrectomy Fractures: Treatments: Asprin, Synthroid DENSITOMETRY RESULTS: Site      Region      Measured Date Measured Age WHO Classification Young Adult T-score BMD         %Change vs. Previous Significant Change (*) AP Spine L1-L4 (L3) 11/20/2020 74.8 Normal -0.7 1.084 g/cm2 -2.1% - AP Spine L1-L4 (L3) 11/07/2014 68.7 Normal -0.5 1.107 g/cm2 - - DualFemur Total Right 11/20/2020 74.8 Osteopenia -1.6 0.811 g/cm2 -10.9% Yes DualFemur Total Right 11/07/2014 68.7 Normal -0.8 0.910 g/cm2 - - DualFemur Total Mean 11/20/2020 74.8 Osteopenia -1.1 0.866 g/cm2 -7.7% Yes DualFemur Total Mean 11/07/2014 68.7 Normal  -0.6 0.938 g/cm2 - - ASSESSMENT: The BMD measured at Femur Total Right is 0.811 g/cm2 with a T-score of -1.6. This patient is considered osteopenic according to [facility]) criteria. The scan quality is limited due to patient not being able to straighten out legs. Compared with the prior study on 11/07/2014, the BMD of the total mean shows a statistically significant decrease. L3 was excluded due to advanced degenerative changes. World Pharmacologist [facility]) criteria for post-menopausal, Caucasian Women: Normal:       T-score at or above -1 SD Osteopenia:   T-score between -1 and -2.5 SD Osteoporosis: T-score at or below -2.5 SD RECOMMENDATIONS: 1. All patients should optimize calcium and vitamin D intake. 2. Consider FDA-approved medical therapies in postmenopausal women and med aged 36 years and older, based on the following: a. A hip or vertebral (clinical or morphometric) fracture b. T-score< -2.5 at the femoral neck or spine after appropriate evaluation to exclude secondary causes c. Low bone mass (T-score between -1.0 and -2.5 at the femoral neck or spine) and a 10-year probability of a hip fracture > 3% or a 10-year probability of a major osteoporosis-related fracture > 20% based on the [place]-adapted WHO algorithm d. Clinician judgment and/or patient preferences may indicate treatment for people with 10-year fracture probabilities above or below these levels FOLLOW-UP: People with diagnosed cases of osteoporosis or at high risk for fracture should have regular bone mineral density tests. For patients eligible for Medicare, routine testing is allowed once every 2 years. The testing frequency can be increased to one year for patients who have rapidly progressing disease, those who are receiving or discontinuing medical therapy to restore bone mass, or have additional risk factors. I have reviewed this report, and agree with the above findings. [facility] Your patient DANICIA TERHAAR completed a FRAX assessment on 11/20/2020 using the [place] (analysis version: 14.10) manufactured by [employer]. The following summarizes the results of our evaluation. PATIENT BIOGRAPHICAL: Name: Kennidee, Heyne Patient ID: [id number] Birth Date: [birth year] Height:    61.5 in. Gender:     [age]    [age]       Weight:    165.0 lbs. Ethnicity:  Black  Exam Date: 11/20/2020 FRAX* RESULTS:  (version: 3.5) 10-year Probability of Fracture1 Major Osteoporotic Fracture2 Hip Fracture 4.2% 0.6% Population: [place] (Black) Risk Factors: None Based on Femur (Left) Neck BMD 1 -The 10-year probability of fracture may be lower than reported if the patient has received treatment. 2 -Major Osteoporotic Fracture: Clinical Spine, Forearm, Hip or Shoulder *FRAX is a Materials engineer of the [employer] of [employer] for Metabolic Bone Disease, a [place] (WHO) [facility]. ASSESSMENT: The probability of a major osteoporotic fracture is 4.2% within the next ten years. The probability of a hip fracture is 0.6% within the next ten years. I have reviewed this report and agree with the above findings. Mark A. Thornton Papas, M.D. [facility] Electronically Signed   By: Lavonia Dana M.D.   On: 11/20/2020 10:49     ASSESSMENT:  1.  Right breast IDC in the upper outer quadrant, (T1CN0) ER/PR positive, HER-2 negative: - Screening mammogram on 08/07/2020 BI-RADS Category 0. - Right breast mammogram and ultrasound on 09/01/2020 shows hypoechoic mass with irregular shape and borders at the 10 o'clock position of the right breast, 7 cm from the nipple measuring 0.8 x 1 x 1.3 cm.  Ultrasound of the right axilla was normal. - Biopsy of the right breast 10 o'clock position consistent with IDC, ER 95% positive, PR 90% positive, Ki-67 5%, HER2 2+ by IHC, negative by FISH. --11/03/2020: lumpectomy performed.   2.  Stage I left breast  cancer: - Diagnosed 01/07/2012, ER/PR positive, status post lumpectomy and SLNB. - XRT completed in November 2013. - Took 5 years of anastrozole.  3.  Social/family history: - She lives with husband at home and is independent of ADLs.  Walks with help of walker.  Denies any falls. - Quit smoking in 2000.  Smoked 3 to 4 cigarettes/day for many years. - Brother had pancreatic cancer.  Mother had colon cancer.  No family history of breast cancers.   PLAN:  1.  Stage I (T1CN0) right breast upper outer quadrant IDC, ER/PR positive, HER2 negative: - patient is /sp lumpectomy on 11/03/2020.  - Sentinel lymph node biopsy was omitted as she has no palpable axillary lymph nodes and ultrasound was negative. - today will plan to start letrozole 2.68m PO daily.  - RTC 4 weeks to assess how patient is tolerating the aromatase inhibitor.  2.  Bone health: - DEXA scan consistent with osteoporosis of the femur - will add calcium/vitamin D to her medicine list --will defer to primary oncologist if he would like to start zometa/prolia.   All questions were answered. The patient knows to call the clinic with any problems, questions or concerns.  JLedell Peoples MD Department of Hematology/Oncology CCollegeat W[facility] MarionPhone: [phone]: [phone]: [email or web address]

## 2020-12-06 ENCOUNTER — Other Ambulatory Visit (HOSPITAL_COMMUNITY): Payer: Self-pay

## 2020-12-06 DIAGNOSIS — Z170 Estrogen receptor positive status [ER+]: Secondary | ICD-10-CM

## 2020-12-11 DIAGNOSIS — I10 Essential (primary) hypertension: Secondary | ICD-10-CM | POA: Diagnosis not present

## 2020-12-11 DIAGNOSIS — G249 Dystonia, unspecified: Secondary | ICD-10-CM | POA: Diagnosis not present

## 2020-12-11 DIAGNOSIS — G20 Parkinson's disease: Secondary | ICD-10-CM | POA: Diagnosis not present

## 2020-12-11 DIAGNOSIS — M130 Polyarthritis, unspecified: Secondary | ICD-10-CM | POA: Diagnosis not present

## 2020-12-11 DIAGNOSIS — R2689 Other abnormalities of gait and mobility: Secondary | ICD-10-CM | POA: Diagnosis not present

## 2020-12-11 DIAGNOSIS — G4700 Insomnia, unspecified: Secondary | ICD-10-CM | POA: Diagnosis not present

## 2021-01-01 ENCOUNTER — Inpatient Hospital Stay (HOSPITAL_COMMUNITY): Payer: Medicare HMO | Attending: Hematology

## 2021-01-01 ENCOUNTER — Other Ambulatory Visit: Payer: Self-pay

## 2021-01-01 DIAGNOSIS — C50411 Malignant neoplasm of upper-outer quadrant of right female breast: Secondary | ICD-10-CM | POA: Diagnosis not present

## 2021-01-01 DIAGNOSIS — Z923 Personal history of irradiation: Secondary | ICD-10-CM | POA: Insufficient documentation

## 2021-01-01 DIAGNOSIS — I10 Essential (primary) hypertension: Secondary | ICD-10-CM | POA: Diagnosis not present

## 2021-01-01 DIAGNOSIS — C50912 Malignant neoplasm of unspecified site of left female breast: Secondary | ICD-10-CM | POA: Diagnosis not present

## 2021-01-01 DIAGNOSIS — Z79811 Long term (current) use of aromatase inhibitors: Secondary | ICD-10-CM | POA: Insufficient documentation

## 2021-01-01 DIAGNOSIS — Z170 Estrogen receptor positive status [ER+]: Secondary | ICD-10-CM | POA: Insufficient documentation

## 2021-01-01 DIAGNOSIS — E559 Vitamin D deficiency, unspecified: Secondary | ICD-10-CM | POA: Insufficient documentation

## 2021-01-01 DIAGNOSIS — Z79899 Other long term (current) drug therapy: Secondary | ICD-10-CM | POA: Diagnosis not present

## 2021-01-01 DIAGNOSIS — Z87891 Personal history of nicotine dependence: Secondary | ICD-10-CM | POA: Insufficient documentation

## 2021-01-01 DIAGNOSIS — G20 Parkinson's disease: Secondary | ICD-10-CM | POA: Diagnosis not present

## 2021-01-01 DIAGNOSIS — M8580 Other specified disorders of bone density and structure, unspecified site: Secondary | ICD-10-CM | POA: Diagnosis not present

## 2021-01-01 DIAGNOSIS — Z7982 Long term (current) use of aspirin: Secondary | ICD-10-CM | POA: Diagnosis not present

## 2021-01-01 DIAGNOSIS — E039 Hypothyroidism, unspecified: Secondary | ICD-10-CM | POA: Diagnosis not present

## 2021-01-01 LAB — CBC WITH DIFFERENTIAL/PLATELET
Abs Immature Granulocytes: 0.01 10*3/uL (ref 0.00–0.07)
Basophils Absolute: 0 10*3/uL (ref 0.0–0.1)
Basophils Relative: 0 %
Eosinophils Absolute: 0.1 10*3/uL (ref 0.0–0.5)
Eosinophils Relative: 3 %
HCT: 42.5 % (ref 36.0–46.0)
Hemoglobin: 13.4 g/dL (ref 12.0–15.0)
Immature Granulocytes: 0 %
Lymphocytes Relative: 29 %
Lymphs Abs: 1 10*3/uL (ref 0.7–4.0)
MCH: 28.7 pg (ref 26.0–34.0)
MCHC: 31.5 g/dL (ref 30.0–36.0)
MCV: 91 fL (ref 80.0–100.0)
Monocytes Absolute: 0.3 10*3/uL (ref 0.1–1.0)
Monocytes Relative: 9 %
Neutro Abs: 2 10*3/uL (ref 1.7–7.7)
Neutrophils Relative %: 59 %
Platelets: 227 10*3/uL (ref 150–400)
RBC: 4.67 MIL/uL (ref 3.87–5.11)
RDW: 12.4 % (ref 11.5–15.5)
WBC: 3.4 10*3/uL — ABNORMAL LOW (ref 4.0–10.5)
nRBC: 0 % (ref 0.0–0.2)

## 2021-01-01 LAB — COMPREHENSIVE METABOLIC PANEL
ALT: 5 U/L (ref 0–44)
AST: 18 U/L (ref 15–41)
Albumin: 4.2 g/dL (ref 3.5–5.0)
Alkaline Phosphatase: 104 U/L (ref 38–126)
Anion gap: 8 (ref 5–15)
BUN: 13 mg/dL (ref 8–23)
CO2: 28 mmol/L (ref 22–32)
Calcium: 9 mg/dL (ref 8.9–10.3)
Chloride: 102 mmol/L (ref 98–111)
Creatinine, Ser: 0.84 mg/dL (ref 0.44–1.00)
GFR, Estimated: >60 mL/min (ref >60)
Glucose, Bld: 90 mg/dL (ref 70–99)
Potassium: 3.9 mmol/L (ref 3.5–5.1)
Sodium: 138 mmol/L (ref 135–145)
Total Bilirubin: 0.7 mg/dL (ref 0.3–1.2)
Total Protein: 7.9 g/dL (ref 6.5–8.1)

## 2021-01-01 LAB — VITAMIN D 25 HYDROXY (VIT D DEFICIENCY, FRACTURES): Vit D, 25-Hydroxy: 24.95 ng/mL — ABNORMAL LOW (ref 30–100)

## 2021-01-08 ENCOUNTER — Ambulatory Visit (HOSPITAL_COMMUNITY): Payer: Medicare HMO | Admitting: Hematology

## 2021-01-11 ENCOUNTER — Inpatient Hospital Stay (HOSPITAL_COMMUNITY): Payer: Medicare HMO | Admitting: Nurse Practitioner

## 2021-01-11 ENCOUNTER — Other Ambulatory Visit: Payer: Self-pay

## 2021-01-11 VITALS — BP 154/74 | HR 63 | Temp 96.8°F | Resp 18

## 2021-01-11 DIAGNOSIS — M8580 Other specified disorders of bone density and structure, unspecified site: Secondary | ICD-10-CM

## 2021-01-11 DIAGNOSIS — C50912 Malignant neoplasm of unspecified site of left female breast: Secondary | ICD-10-CM | POA: Diagnosis not present

## 2021-01-11 DIAGNOSIS — E559 Vitamin D deficiency, unspecified: Secondary | ICD-10-CM | POA: Diagnosis not present

## 2021-01-11 DIAGNOSIS — Z87891 Personal history of nicotine dependence: Secondary | ICD-10-CM | POA: Diagnosis not present

## 2021-01-11 DIAGNOSIS — Z79811 Long term (current) use of aromatase inhibitors: Secondary | ICD-10-CM | POA: Diagnosis not present

## 2021-01-11 DIAGNOSIS — C50411 Malignant neoplasm of upper-outer quadrant of right female breast: Secondary | ICD-10-CM

## 2021-01-11 DIAGNOSIS — Z170 Estrogen receptor positive status [ER+]: Secondary | ICD-10-CM | POA: Diagnosis not present

## 2021-01-11 DIAGNOSIS — E039 Hypothyroidism, unspecified: Secondary | ICD-10-CM | POA: Diagnosis not present

## 2021-01-11 DIAGNOSIS — G20 Parkinson's disease: Secondary | ICD-10-CM | POA: Diagnosis not present

## 2021-01-11 NOTE — Patient Instructions (Signed)
Calcium 1200 mg and Vitamin D 1000 iu daily.

## 2021-01-11 NOTE — Progress Notes (Signed)
[facility] [address], [place] [address]   Patient Care Team: Sasser, Silvestre Moment, MD as PCP - General (Cardiology) Harl Bowie Alphonse Guild, MD as PCP - Cardiology (Cardiology) Brien Mates, RN as Oncology Nurse Navigator (Oncology)   Interval History: Lisa Torres 75 y.o. female with history of stage I ER/PR positive HER2/neu negative right breast cancer status postlumpectomy currently on letrozole who returns to clinic for follow-up.  She feels well.  Some troubles falling asleep.  No dizziness or weakness.  No recent fevers or illness.  No easy bleeding or bruising.  No melena or hematochezia.  Appetite is good and she denies unintentional weight loss.  No chest pain or shortness of breath.  No nausea, vomiting, constipation, diarrhea.  No urinary complaints no new lumps or bumps.  No further specific complaints today.   SUMMARY OF ONCOLOGIC HISTORY: Oncology History   No history exists.    In terms of breast cancer risk profile:  She menarched at early age of 44 and went to menopause at age 72 due to partial hysterectomy.  She had 3 pregnancy, her first child was born at age 15.  She received birth control pills for approximately several years.  She was never exposed to fertility medications or hormone replacement therapy.  She has positive family history of Breast/GYN/GI cancer.  MEDICAL HISTORY:  Past Medical History:  Diagnosis Date   Breast cancer ([employer]) 11/2011   Chronic back pain    Chronic leg pain    bilateral   Chronic leukopenia    DDD (degenerative disc disease), cervical    DDD (degenerative disc disease), lumbar    Gait abnormality    GERD (gastroesophageal reflux disease)    Hypertension    Hypothyroidism    Parkinson's disease ([place])    Radiation 04/2012   33 treatments for breast cancer   Right knee pain    Thyroid disease     SURGICAL HISTORY: Past Surgical History:  Procedure Laterality Date   ABDOMINAL HYSTERECTOMY      partial   BREAST LUMPECTOMY Left 12/2011   CHOLECYSTECTOMY     COLONOSCOPY N/A 07/01/2013   Procedure: COLONOSCOPY;  Surgeon: Rogene Houston, MD;  [facility];  Service: Endoscopy;  Laterality: N/A;  1030   COLONOSCOPY N/A 09/15/2019   Procedure: COLONOSCOPY;  Surgeon: Rogene Houston, MD;  [facility];  Service: Endoscopy;  Laterality: N/A;  815   ESOPHAGOGASTRODUODENOSCOPY N/A 11/21/2017   Procedure: ESOPHAGOGASTRODUODENOSCOPY (EGD);  Surgeon: Rogene Houston, MD;  [facility];  Service: Endoscopy;  Laterality: N/A;  2:45   EXCISIONAL TOTAL KNEE ARTHROPLASTY WITH ANTIBIOTIC SPACERS Right 01/12/2018   Procedure: EXCISIONAL TOTAL KNEE ARTHROPLASTY WITH ANTIBIOTIC SPACERS;  Surgeon: Vickey Huger, MD;  Location: [facility];  Service: Orthopedics;  Laterality: Right;   JOINT REPLACEMENT  01/2017   right knee   KNEE SURGERY Right 01/12/2018   EXCISIONAL TOTAL KNEE ARTHROPLASTY WITH ANTIBIOTIC SPACERS   MASTECTOMY, PARTIAL Right 11/03/2020   Procedure: RIGHT PARTIAL MASTECTOMY AFTER TAG PLACEMENT;  Surgeon: Aviva Signs, MD;  Location: [facility];  Service: General;  Laterality: Right;   POLYPECTOMY  09/15/2019   Procedure: POLYPECTOMY;  Surgeon: Rogene Houston, MD;  [facility];  Service: Endoscopy;;  Cold Snare transverse colon polyp   TOTAL KNEE REVISION Right 03/23/2018   Procedure: RIGHT TOTAL KNEE REVISION;  Surgeon: Vickey Huger, MD;  [facility];  Service: Orthopedics;  Laterality: Right;  TUBAL LIGATION      SOCIAL HISTORY: Social History   Socioeconomic History   Marital status: Married    Spouse name: Marcello Moores   Number of children: 3   Years of education: 80 th   Highest education level: Not on file  Occupational History    Comment: retired  Tobacco Use   Smoking status: Former    Packs/day: 0.50    Years: 18.00    Pack years: 9.00    Types: Cigarettes    Quit date: 09/30/1998    Years since quitting: 22.2   Smokeless tobacco:  Never  Vaping Use   Vaping Use: Never used  Substance and Sexual Activity   Alcohol use: No   Drug use: No   Sexual activity: Not on file  Other Topics Concern   Not on file  Social History Narrative   Patient lives at home with her husband Marcello Moores).    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   Right handed   Social Determinants of Health   Financial Resource Strain: Low Risk    Difficulty of Paying Living Expenses: Not very hard  Food Insecurity: No Food Insecurity   Worried About Charity fundraiser in the Last Year: Never true   Arboriculturist in the Last Year: Never true  Transportation Needs: No Transportation Needs   Lack of Transportation (Medical): No   Lack of Transportation (Non-Medical): No  Physical Activity: Inactive   Days of Exercise per Week: 0 days   Minutes of Exercise per Session: 0 min  Stress: No Stress Concern Present   Feeling of Stress : Only a little  Social Connections: Engineer, building services of Communication with Friends and Family: More than three times a week   Frequency of Social Gatherings with Friends and Family: More than three times a week   Attends Religious Services: More than 4 times per year   Active Member of [employer] or Organizations: No   Attends Music therapist: More than 4 times per year   Marital Status: Married  Human resources officer Violence: Not At Risk   Fear of Current or Ex-Partner: No   Emotionally Abused: No   Physically Abused: No   Sexually Abused: No  - She lives with husband at home and is independent of ADLs.  Walks with help of walker.  Denies any falls. - Quit smoking in 2000.  Smoked 3 to 4 cigarettes/day for many years. - Brother had pancreatic cancer.  Mother had colon cancer.  No family history of breast cancers.  FAMILY HISTORY: Family History  Problem Relation Age of Onset   Cancer Mother 41       colon cancer   Cancer Father 56       throat cancer   Pancreatic cancer Brother        63y  11-14-15    ALLERGIES:  is allergic to hydrocodone and tramadol.  MEDICATIONS:  Current Outpatient Medications  Medication Sig Dispense Refill   amLODipine (NORVASC) 5 MG tablet Take 5 mg by mouth daily.      aspirin 325 MG EC tablet Take 1 tablet (325 mg total) by mouth 2 (two) times daily. 30 tablet 0   B Complex Vitamins (B COMPLEX PO) Place 1 tablet under the tongue daily.     baclofen (LIORESAL) 10 MG tablet Take 10 mg by mouth at bedtime.      calcium-vitamin D (OSCAL WITH D) 500-200 MG-UNIT tablet Take 1  tablet by mouth daily with breakfast. 90 tablet 1   carbidopa-levodopa (SINEMET IR) 25-100 MG tablet Take 2 tablets by mouth 3 (three) times daily.     diazepam (VALIUM) 5 MG tablet Take 5 mg by mouth 2 (two) times daily.     hydrocortisone cream 0.5 % Apply 1 application topically 2 (two) times daily. 30 g 0   Istradefylline (NOURIANZ) 20 MG TABS Take 20 mg by mouth daily.     letrozole (FEMARA) 2.5 MG tablet Take 1 tablet (2.5 mg total) by mouth daily. 90 tablet 1   levothyroxine (SYNTHROID, LEVOTHROID) 100 MCG tablet Take 100 mcg by mouth daily before breakfast.     pantoprazole (PROTONIX) 40 MG tablet Take 1 tablet (40 mg total) by mouth daily before breakfast. 90 tablet 3   polyethylene glycol powder (MIRALAX) 17 GM/SCOOP powder Take 17 g by mouth daily. (Patient taking differently: Take 17 g by mouth daily as needed (constipation.).) 850 g 1   traZODone (DESYREL) 50 MG tablet Take 100 mg by mouth at bedtime.      No current facility-administered medications for this visit.    REVIEW OF SYSTEMS:   Review of Systems  Constitutional:  Positive for fatigue (50%). Negative for appetite change and unexpected weight change.  HENT:   Negative for mouth sores, sore throat and trouble swallowing.   Respiratory:  Negative for chest tightness and shortness of breath.   Cardiovascular:  Negative for leg swelling.  Gastrointestinal:  Negative for abdominal pain, constipation, diarrhea,  nausea and vomiting.  Genitourinary:  Negative for bladder incontinence and dysuria.   Musculoskeletal:  Positive for gait problem (d/t Parkinson's). Negative for flank pain and neck stiffness.  Skin:  Negative for itching, rash and wound.  Neurological:  Positive for gait problem (d/t Parkinson's) and numbness (& tingling in occipital region). Negative for dizziness, headaches and light-headedness.  Psychiatric/Behavioral:  Negative for confusion, depression and sleep disturbance. The patient is not nervous/anxious.   All other systems reviewed and are negative.  PHYSICAL EXAMINATION: ECOG PERFORMANCE STATUS: 2 - Symptomatic, <50% confined to bed  Vitals:   01/11/21 1115  BP: (!) 154/74  Pulse: 63  Resp: 18  Temp: (!) 96.8 F (36 C)  SpO2: 100%   Filed Weights   Physical Exam Vitals reviewed.  Constitutional:      Appearance: Normal appearance.     Comments: In wheelchair  Cardiovascular:     Rate and Rhythm: Normal rate and regular rhythm.     Pulses: Normal pulses.     Heart sounds: Normal heart sounds.  Pulmonary:     Effort: Pulmonary effort is normal.     Breath sounds: Normal breath sounds.  Chest:  Breasts:    Right: Normal. No swelling, bleeding, inverted nipple, mass, nipple discharge, skin change, tenderness, axillary adenopathy or supraclavicular adenopathy.     Left: Normal. No swelling, bleeding, inverted nipple, mass, nipple discharge, skin change (lumpectomy scar well-healed), tenderness, axillary adenopathy or supraclavicular adenopathy.  Musculoskeletal:     Right lower leg: No edema.     Left lower leg: No edema.  Lymphadenopathy:     Upper Body:     Right upper body: No supraclavicular, axillary or pectoral adenopathy.     Left upper body: No supraclavicular, axillary or pectoral adenopathy.  Neurological:     General: No focal deficit present.     Mental Status: She is alert and oriented to person, place, and time.  Psychiatric:  Mood and  Affect: Mood normal.        Behavior: Behavior normal.     LABORATORY DATA:  I have reviewed the data as listed Recent Results (from the past 2160 hour(s))  SARS CORONAVIRUS 2 (TAT 6-24 HRS) Nasopharyngeal Nasopharyngeal Swab     Status: None   Collection Time: 11/01/20  1:58 PM   Specimen: Nasopharyngeal Swab  Result Value Ref Range   SARS Coronavirus 2 NEGATIVE NEGATIVE    Comment: (NOTE) SARS-CoV-2 target nucleic acids are NOT DETECTED.  The SARS-CoV-2 RNA is generally detectable in upper and lower respiratory specimens during the acute phase of infection. Negative results do not preclude SARS-CoV-2 infection, do not rule out co-infections with other pathogens, and should not be used as the sole basis for treatment or other patient management decisions. Negative results must be combined with clinical observations, patient history, and epidemiological information. The expected result is Negative.  Fact Sheet for Patients: SugarRoll.be  Fact Sheet for Healthcare Providers: https://www.woods-mathews.com/  This test is not yet approved or cleared by the [place] FDA and  has been authorized for detection and/or diagnosis of SARS-CoV-2 by FDA under an Emergency Use Authorization (EUA). This EUA will remain  in effect (meaning this test can be used) for the duration of the COVID-19 declaration under Se ction 564(b)(1) of the Act, 21 U.S.C. section 360bbb-3(b)(1), unless the authorization is terminated or revoked sooner.  Performed at [facility], [place] [address] [place], [place] [address]   CBC with Differential/Platelet     Status: Abnormal   Collection Time: 11/01/20  3:45 PM  Result Value Ref Range   WBC 3.2 (L) 4.0 - 10.5 K/uL   RBC 4.72 3.87 - 5.11 MIL/uL   Hemoglobin 13.4 12.0 - 15.0 g/dL   HCT 43.0 36.0 - 46.0 %   MCV 91.1 80.0 - 100.0 fL   MCH 28.4 26.0 - 34.0 pg   MCHC 31.2 30.0 - 36.0 g/dL   RDW 12.5 11.5 -  15.5 %   Platelets 188 150 - 400 K/uL   nRBC 0.0 0.0 - 0.2 %   Neutrophils Relative % 52 %   Neutro Abs 1.7 1.7 - 7.7 K/uL   Lymphocytes Relative 34 %   Lymphs Abs 1.1 0.7 - 4.0 K/uL   Monocytes Relative 9 %   Monocytes Absolute 0.3 0.1 - 1.0 K/uL   Eosinophils Relative 4 %   Eosinophils Absolute 0.1 0.0 - 0.5 K/uL   Basophils Relative 1 %   Basophils Absolute 0.0 0.0 - 0.1 K/uL   Immature Granulocytes 0 %   Abs Immature Granulocytes 0.01 0.00 - 0.07 K/uL    Comment: Performed at [facility], [address], [place], [place] [address]  Basic metabolic panel     Status: None   Collection Time: 11/01/20  3:45 PM  Result Value Ref Range   Sodium 137 135 - 145 mmol/L   Potassium 3.9 3.5 - 5.1 mmol/L   Chloride 102 98 - 111 mmol/L   CO2 25 22 - 32 mmol/L   Glucose, Bld 82 70 - 99 mg/dL    Comment: Glucose reference range applies only to samples taken after fasting for at least 8 hours.   BUN 13 8 - 23 mg/dL   Creatinine, Ser 0.73 0.44 - 1.00 mg/dL   Calcium 9.4 8.9 - 10.3 mg/dL   GFR, Estimated >60 >60 mL/min    Comment: (NOTE) Calculated using the CKD-EPI Creatinine Equation (2021)  Anion gap 10 5 - 15    Comment: Performed at [facility], [address], [place], [place] [address]  Surgical pathology     Status: None   Collection Time: 11/03/20  9:30 AM  Result Value Ref Range   SURGICAL PATHOLOGY      SURGICAL PATHOLOGY CASE: [id number] PATIENT: Loraine Grip Surgical Pathology Report     Clinical History: right breast cancer     FINAL MICROSCOPIC DIAGNOSIS:  A. BREAST, RIGHT LUMPECTOMY: - Invasive ductal carcinoma, 1.6 cm, grade 1 with calcifications - Ductal carcinoma in situ, intermediate grade - Resection margins are negative for carcinoma; closest is the superior margin at less than 1 mm - Biopsy site changes - See oncology table      ONCOLOGY TABLE:  INVASIVE CARCINOMA OF THE BREAST:  Resection  Procedure: Lumpectomy Specimen  Laterality: Right Histologic Type: Invasive ductal carcinoma Histologic Grade:      Glandular (Acinar)/Tubular Differentiation: 1      Nuclear Pleomorphism: 2      Mitotic Rate: 1      Overall Grade: 1 Tumor Size: 1.6 cm Ductal Carcinoma In Situ: Present, intermediate grade Treatment Effect in the Breast: No known presurgical therapy Margins: All margins negative for invasive carcinoma      Distance from C losest Margin (mm): Less than 1 mm      Specify Closest Margin (required only if <83m): Superior margin DCIS Margins: Uninvolved by DCIS      Distance from Closest Margin (mm): 2 mm      Specify Closest Margin (required only if <134m: Inferior margin Regional Lymph Nodes: Not applicable (no lymph nodes submitted or found)  Distant Metastasis:      Distant Site(s) Involved: Not applicable Breast Biomarker Testing Performed on Previous Biopsy:      Testing Performed on Case Number: [id number]            Estrogen Receptor: 95%, positive, strong staining intensity            Progesterone Receptor: 90%, positive, strong staining intensity            HER2: Negative (FISH)            Ki-67: 5% Pathologic Stage Classification (pTNM, AJCC 8th Edition): pT1c, pN not assigned Representative Tumor Block: 1B Comment(s): None  (v4.5.0.0)     GROSS DESCRIPTION:  Specimen type: Right breast lumpectomy, in formalin at 0948 hours Size: 5.8 cm from medial to lateral, 5.3  cm from anterior to posterior, and ranges from 1.4 cm to 2.3 cm from superior to inferior. Orientation: Black anterior, Blue Inferior, Orange Lateral, Yellow Medial, Green posterior, Red Superior. Localized area: None Cut surface: There is a 1.6 x 1.4 x 1.1 cm tan-white firm ill-defined mass which contains a clip. Margins: The mass abuts the superior and inferior margins at the thinnest portion of the specimen, and is 1 cm or greater from remaining margins. Prognostic indicators: Not taken at gross Block  summary: Block 1 = mass, without margin Block 2 = mass abutting superior margin Blocks 3, 4 = mass abutting inferior margin Block 5 = posterior margin nearest mass Block 6 = anterior margin nearest mass Block 7 = medial margin nearest mass Block 8 = lateral margin nearest mass  SW 11/03/2020    Final Diagnosis performed by NiJaquita FoldsMD.   Electronically signed 11/06/2020 Technical component [facility] at [facility] [place] [id number] Professional component performed at [facility]  [place] [address], [place], [place] [address].  Immunohistochemistry Technical component (if applicable) was performed at [facility]. [address], [place], [place], [place] [address].   IMMUNOHISTOCHEMISTRY DISCLAIMER (if applicable): Some of these immunohistochemical stains may have been developed and the performance characteristics determine by [facility]. Some may not have been cleared or approved by the U.S. Food and Drug Administration. The FDA has determined that such clearance or approval is not necessary. This test is used for clinical purposes. It should not be regarded as investigational or for research. This laboratory is certified under the [place] (CLIA-88) as qualified to perform high complexity clinical laboratory testing.  The controls stained appropriately.   Vitamin D 25 hydroxy     Status: Abnormal   Collection Time: 01/01/21  1:17 PM  Result Value Ref Range   Vit D, 25-Hydroxy 24.95 (L) 30 - 100 ng/mL    Comment: (NOTE) Vitamin D deficiency has been defined by the [place] practice guideline as a level of serum 25-OH  vitamin D less than 20 ng/mL (1,2). The Endocrine Society went on to  further define vitamin D insufficiency as a level between 21 and 29  ng/mL (2).  1. IOM (Institute of  Medicine). 2010. Dietary reference intakes for  calcium and D. [place]: The [employer]. 2. Holick MF, Binkley [place], Bischoff-Ferrari HA, et al. Evaluation,  treatment, and prevention of vitamin D deficiency: an Endocrine  Society clinical practice guideline, JCEM. 2011 Jul; 96(7): 1911-30.  Performed at [facility], [place] [address], [place], [place] [address]   CBC with Differential     Status: Abnormal   Collection Time: 01/01/21  1:17 PM  Result Value Ref Range   WBC 3.4 (L) 4.0 - 10.5 K/uL   RBC 4.67 3.87 - 5.11 MIL/uL   Hemoglobin 13.4 12.0 - 15.0 g/dL   HCT 42.5 36.0 - 46.0 %   MCV 91.0 80.0 - 100.0 fL   MCH 28.7 26.0 - 34.0 pg   MCHC 31.5 30.0 - 36.0 g/dL   RDW 12.4 11.5 - 15.5 %   Platelets 227 150 - 400 K/uL   nRBC 0.0 0.0 - 0.2 %   Neutrophils Relative % 59 %   Neutro Abs 2.0 1.7 - 7.7 K/uL   Lymphocytes Relative 29 %   Lymphs Abs 1.0 0.7 - 4.0 K/uL   Monocytes Relative 9 %   Monocytes Absolute 0.3 0.1 - 1.0 K/uL   Eosinophils Relative 3 %   Eosinophils Absolute 0.1 0.0 - 0.5 K/uL   Basophils Relative 0 %   Basophils Absolute 0.0 0.0 - 0.1 K/uL   Immature Granulocytes 0 %   Abs Immature Granulocytes 0.01 0.00 - 0.07 K/uL    Comment: Performed at [facility], [address], [place],  [address]  Comprehensive metabolic panel     Status: None   Collection Time: 01/01/21  1:17 PM  Result Value Ref Range   Sodium 138 135 - 145 mmol/L   Potassium 3.9 3.5 - 5.1 mmol/L   Chloride 102 98 - 111 mmol/L   CO2 28 22 - 32 mmol/L   Glucose, Bld 90 70 - 99 mg/dL    Comment: Glucose reference range applies only to samples taken after fasting for at least 8 hours.   BUN 13 8 - 23 mg/dL   Creatinine, Ser 0.84 0.44 - 1.00 mg/dL  Calcium 9.0 8.9 - 10.3 mg/dL   Total Protein 7.9 6.5 - 8.1 g/dL   Albumin 4.2 3.5 - 5.0 g/dL   AST 18 15 - 41 U/L   ALT 5 0 - 44 U/L   Alkaline Phosphatase 104 38 - 126 U/L   Total Bilirubin 0.7 0.3 - 1.2 mg/dL   GFR,  Estimated >60 >60 mL/min    Comment: (NOTE) Calculated using the CKD-EPI Creatinine Equation (2021)    Anion gap 8 5 - 15    Comment: Performed at [facility], [address], [place], [place] [address]    Surgical pathology (Accession [id number]) on 09/07/2020: Right breast biopsy: invasive ductal carcinoma, ER/PR positive, HER-2 negative, Ki-67 5%.  RADIOGRAPHIC STUDIES: I have personally reviewed the radiological reports and agreed with the findings in the report. No results found.   ASSESSMENT & PLAN:  1.  Right breast IDC in the upper outer quadrant, (T1CN0) ER/PR positive, HER-2 negative: - Abnormal screening mammogram in February 2022.  Follow-up imaging on 09/01/2020 showed hypoechoic mass with irregular shape and borders at the 10 o'clock position of the right breast 7 cm from the nipple measuring 0.8 x 1 x 1.3 cm.  Ultrasound of right axilla was normal.  Biopsy consistent with IDC, ER 95% positive, PR 90% positive Ki-67 5%, HER2 2% by IHC.  Negative by FISH.  She underwent lumpectomy on 11/03/2020.  Currently on letrozole.  Tolerating well without significant side effects.  Clinically asymptomatic.  Continue letrozole.  Repeat mammogram in February 2023 for screening.  Per NCCN guidelines recommend history and physical every 3 months.  2.  Stage I left breast cancer: Diagnosed 01/07/2012.  ER/PR positive.  She underwent lumpectomy and sentinel lymph node biopsy followed by radiation which was completed November 2013.  She completed 5 years of adjuvant anastrozole completed in 2018.  Screening mammogram in February 2022 was negative for malignancy with stable postsurgical changes.  Plan to repeat annual mammogram  3.  Osteopenia-bone density scan at baseline on 11/20/2020 revealed T score of -1.6 consistent with osteopenia.  In setting of AI monitor closely.  Recommend optimizing calcium 1200 mg daily and vitamin D 1000 IU daily along with weightbearing exercise as tolerated though limited  d/t parkinsons.  Plan to repeat bone density scan in June 2023.  If worse, consider treatment: oral vs injections  4.  Vitamin D deficiency-vitamin D low at 24.95.  Recommend starting oral vitamin D 1000 IU daily as above. Plan to repeat at next visit.    3 mo- lab, MD- la  All questions were answered. The patient knows to call the clinic with any problems, questions or concerns.  Patient is to return to clinic sooner if any concerning symptoms.  Beckey Rutter, DNP, AGNP-C

## 2021-04-13 ENCOUNTER — Inpatient Hospital Stay (HOSPITAL_COMMUNITY): Payer: Medicare HMO | Attending: Hematology

## 2021-04-17 ENCOUNTER — Other Ambulatory Visit (HOSPITAL_COMMUNITY): Payer: Self-pay

## 2021-04-17 DIAGNOSIS — Z170 Estrogen receptor positive status [ER+]: Secondary | ICD-10-CM

## 2021-04-17 DIAGNOSIS — C50411 Malignant neoplasm of upper-outer quadrant of right female breast: Secondary | ICD-10-CM

## 2021-04-18 ENCOUNTER — Inpatient Hospital Stay (HOSPITAL_COMMUNITY): Payer: Medicare HMO | Attending: Hematology

## 2021-04-18 ENCOUNTER — Other Ambulatory Visit: Payer: Self-pay

## 2021-04-18 DIAGNOSIS — Z800 Family history of malignant neoplasm of digestive organs: Secondary | ICD-10-CM | POA: Insufficient documentation

## 2021-04-18 DIAGNOSIS — Z853 Personal history of malignant neoplasm of breast: Secondary | ICD-10-CM | POA: Diagnosis not present

## 2021-04-18 DIAGNOSIS — Z170 Estrogen receptor positive status [ER+]: Secondary | ICD-10-CM | POA: Diagnosis not present

## 2021-04-18 DIAGNOSIS — C50411 Malignant neoplasm of upper-outer quadrant of right female breast: Secondary | ICD-10-CM | POA: Diagnosis not present

## 2021-04-18 DIAGNOSIS — Z923 Personal history of irradiation: Secondary | ICD-10-CM | POA: Insufficient documentation

## 2021-04-18 DIAGNOSIS — M8580 Other specified disorders of bone density and structure, unspecified site: Secondary | ICD-10-CM | POA: Insufficient documentation

## 2021-04-18 DIAGNOSIS — Z79811 Long term (current) use of aromatase inhibitors: Secondary | ICD-10-CM | POA: Insufficient documentation

## 2021-04-18 DIAGNOSIS — Z7982 Long term (current) use of aspirin: Secondary | ICD-10-CM | POA: Insufficient documentation

## 2021-04-18 DIAGNOSIS — Z79899 Other long term (current) drug therapy: Secondary | ICD-10-CM | POA: Diagnosis not present

## 2021-04-18 DIAGNOSIS — Z87891 Personal history of nicotine dependence: Secondary | ICD-10-CM | POA: Insufficient documentation

## 2021-04-18 LAB — COMPREHENSIVE METABOLIC PANEL
ALT: <5 U/L (ref 0–44)
AST: 18 U/L (ref 15–41)
Albumin: 4.1 g/dL (ref 3.5–5.0)
Alkaline Phosphatase: 106 U/L (ref 38–126)
Anion gap: 9 (ref 5–15)
BUN: 13 mg/dL (ref 8–23)
CO2: 25 mmol/L (ref 22–32)
Calcium: 9.2 mg/dL (ref 8.9–10.3)
Chloride: 106 mmol/L (ref 98–111)
Creatinine, Ser: 0.89 mg/dL (ref 0.44–1.00)
GFR, Estimated: >60 mL/min (ref >60)
Glucose, Bld: 89 mg/dL (ref 70–99)
Potassium: 4.2 mmol/L (ref 3.5–5.1)
Sodium: 140 mmol/L (ref 135–145)
Total Bilirubin: 0.6 mg/dL (ref 0.3–1.2)
Total Protein: 7.9 g/dL (ref 6.5–8.1)

## 2021-04-18 LAB — CBC WITH DIFFERENTIAL/PLATELET
Abs Immature Granulocytes: 0.01 10*3/uL (ref 0.00–0.07)
Basophils Absolute: 0 10*3/uL (ref 0.0–0.1)
Basophils Relative: 1 %
Eosinophils Absolute: 0.2 10*3/uL (ref 0.0–0.5)
Eosinophils Relative: 4 %
HCT: 39.8 % (ref 36.0–46.0)
Hemoglobin: 12.6 g/dL (ref 12.0–15.0)
Immature Granulocytes: 0 %
Lymphocytes Relative: 33 %
Lymphs Abs: 1.4 10*3/uL (ref 0.7–4.0)
MCH: 28.9 pg (ref 26.0–34.0)
MCHC: 31.7 g/dL (ref 30.0–36.0)
MCV: 91.3 fL (ref 80.0–100.0)
Monocytes Absolute: 0.3 10*3/uL (ref 0.1–1.0)
Monocytes Relative: 7 %
Neutro Abs: 2.4 10*3/uL (ref 1.7–7.7)
Neutrophils Relative %: 55 %
Platelets: 181 10*3/uL (ref 150–400)
RBC: 4.36 MIL/uL (ref 3.87–5.11)
RDW: 12.4 % (ref 11.5–15.5)
WBC: 4.3 10*3/uL (ref 4.0–10.5)
nRBC: 0 % (ref 0.0–0.2)

## 2021-04-18 LAB — VITAMIN D 25 HYDROXY (VIT D DEFICIENCY, FRACTURES): Vit D, 25-Hydroxy: 31.41 ng/mL (ref 30–100)

## 2021-04-18 NOTE — Progress Notes (Signed)
[facility] [address], [place] [address]   Patient Care Team: Sasser, Silvestre Moment, MD as PCP - General (Cardiology) Harl Bowie Alphonse Guild, MD as PCP - Cardiology (Cardiology) Brien Mates, RN as Oncology Nurse Navigator (Oncology)  SUMMARY OF ONCOLOGIC HISTORY: Oncology History   No history exists.    CHIEF COMPLIANT: Follow-up of right breast cancer   INTERVAL HISTORY: [name] is a 75 y.o. female here today for follow up of her right breast cancer. Her last visit was on 10/09/2020.   Today she reports feeling well. She is currently taking letrozole and tolerating it well; she denies hot flashes. She reports pain in her right leg due to knee surgery. She reports hair thinning and a hot sensation in her back.   REVIEW OF SYSTEMS:   Review of Systems  Constitutional:  Positive for fatigue (25%). Negative for appetite change (75%).  Gastrointestinal:  Positive for constipation.  Endocrine: Negative for hot flashes.  Musculoskeletal:  Positive for arthralgias (R leg).  Neurological:  Positive for numbness.  All other systems reviewed and are negative.  I have reviewed the past medical history, past surgical history, social history and family history with the patient and they are unchanged from previous note.   ALLERGIES:   is allergic to hydrocodone and tramadol.   MEDICATIONS:  Current Outpatient Medications  Medication Sig Dispense Refill   amLODipine (NORVASC) 5 MG tablet Take 5 mg by mouth daily.      aspirin 325 MG EC tablet Take 1 tablet (325 mg total) by mouth 2 (two) times daily. 30 tablet 0   B Complex Vitamins (B COMPLEX PO) Place 1 tablet under the tongue daily.     baclofen (LIORESAL) 10 MG tablet Take 10 mg by mouth at bedtime.      calcium-vitamin D (OSCAL WITH D) 500-200 MG-UNIT tablet Take 1 tablet by mouth daily with breakfast. 90 tablet 1   carbidopa-levodopa (SINEMET IR) 25-100 MG tablet Take 2 tablets by mouth 3  (three) times daily.     diazepam (VALIUM) 5 MG tablet Take 5 mg by mouth 2 (two) times daily.     ergocalciferol (VITAMIN D2) 1.25 MG (50000 UT) capsule Take by mouth.     hydrocortisone cream 0.5 % Apply 1 application topically 2 (two) times daily. 30 g 0   Istradefylline (NOURIANZ) 20 MG TABS Take 20 mg by mouth daily.     letrozole (FEMARA) 2.5 MG tablet Take 1 tablet (2.5 mg total) by mouth daily. 90 tablet 1   levothyroxine (SYNTHROID, LEVOTHROID) 100 MCG tablet Take 100 mcg by mouth daily before breakfast.     pantoprazole (PROTONIX) 40 MG tablet Take 1 tablet (40 mg total) by mouth daily before breakfast. 90 tablet 3   polyethylene glycol powder (MIRALAX) 17 GM/SCOOP powder Take 17 g by mouth daily. (Patient taking differently: Take 17 g by mouth daily as needed (constipation.).) 850 g 1   traZODone (DESYREL) 50 MG tablet Take 100 mg by mouth at bedtime.      No current facility-administered medications for this visit.     PHYSICAL EXAMINATION: Performance status (ECOG): 2 - Symptomatic, <50% confined to bed  Vitals:   04/19/21 1425  BP: (!) 164/78  Pulse: 70  Resp: 18  Temp: (!) 97.2 F (36.2 C)  SpO2: 98%   Wt Readings from Last 3 Encounters:  11/09/20 165 lb (74.8 kg)  11/01/20 160 lb (72.6 kg)  10/19/20 166 lb (  75.3 kg)   Physical Exam Vitals reviewed.  Constitutional:      Appearance: Normal appearance.     Comments: In wheelchair  Cardiovascular:     Rate and Rhythm: Normal rate and regular rhythm.     Pulses: Normal pulses.     Heart sounds: Normal heart sounds.  Pulmonary:     Effort: Pulmonary effort is normal.     Breath sounds: Normal breath sounds.  Neurological:     General: No focal deficit present.     Mental Status: She is alert and oriented to person, place, and time.  Psychiatric:        Mood and Affect: Mood normal.        Behavior: Behavior normal.    Breast Exam Chaperone: Thana Ates     LABORATORY DATA:  I have reviewed the data  as listed CMP Latest Ref Rng & Units 04/18/2021 01/01/2021 11/01/2020  Glucose 70 - 99 mg/dL 89 90 82  BUN 8 - 23 mg/dL '13 13 13  ' Creatinine 0.44 - 1.00 mg/dL 0.89 0.84 0.73  Sodium 135 - 145 mmol/L 140 138 137  Potassium 3.5 - 5.1 mmol/L 4.2 3.9 3.9  Chloride 98 - 111 mmol/L 106 102 102  CO2 22 - 32 mmol/L '25 28 25  ' Calcium 8.9 - 10.3 mg/dL 9.2 9.0 9.4  Total Protein 6.5 - 8.1 g/dL 7.9 7.9 -  Total Bilirubin 0.3 - 1.2 mg/dL 0.6 0.7 -  Alkaline Phos 38 - 126 U/L 106 104 -  AST 15 - 41 U/L 18 18 -  ALT 0 - 44 U/L <5 5 -   No results found for: KQA060 Lab Results  Component Value Date   WBC 4.3 04/18/2021   HGB 12.6 04/18/2021   HCT 39.8 04/18/2021   MCV 91.3 04/18/2021   PLT 181 04/18/2021   NEUTROABS 2.4 04/18/2021    ASSESSMENT:  1.  Right breast IDC in the upper outer quadrant, (T1CN0) ER/PR positive, HER-2 negative: - Screening mammogram on 08/07/2020 BI-RADS Category 0. - Right breast mammogram and ultrasound on 09/01/2020 shows hypoechoic mass with irregular shape and borders at the 10 o'clock position of the right breast, 7 cm from the nipple measuring 0.8 x 1 x 1.3 cm.  Ultrasound of the right axilla was normal. - Biopsy of the right breast 10 o'clock position consistent with IDC, ER 95% positive, PR 90% positive, Ki-67 5%, HER2 2+ by IHC, negative by FISH. - Right lumpectomy on 11/03/2020 with 1.6 cm invasive ductal carcinoma, grade 1.  Resection margins negative.  Closest margin is 1 mm, superior margin.  PT1CPNX. - Anastrozole started on 12/05/2020.   2.  Stage I left breast cancer: - Diagnosed 01/07/2012, ER/PR positive, status post lumpectomy and SLNB. - XRT completed in November 2013. - Took 5 years of anastrozole.   3.  Social/family history: - She lives with husband at home and is independent of ADLs.  Walks with help of walker.  Denies any falls. - Quit smoking in 2000.  Smoked 3 to 4 cigarettes/day for many years. - Brother had pancreatic cancer.  Mother had colon  cancer.  No family history of breast cancers.   PLAN:  1.  Stage I (PT1CPNX) right breast upper outer quadrant IDC, ER/PR positive, HER2 negative: - Anastrozole started on 12/05/2020. - She is tolerating anastrozole reasonably well.  No major hot flashes or musculoskeletal symptoms. - I reviewed labs today which showed normal LFTs.  Vitamin D was normal.  CBC was  grossly normal. - Continue anastrozole daily.  RTC 4 months for follow-up with repeat labs.   2.  Bone health: - DEXA scan on 11/20/2020 shows T score -1.6, osteopenia. - Vitamin D is 31.4. - Continue vitamin D 1000 units daily along with calcium supplements.  Breast Cancer therapy associated bone loss: I have recommended calcium, Vitamin D and weight bearing exercises.  Orders placed this encounter:  No orders of the defined types were placed in this encounter.   The patient has a good understanding of the overall plan. She agrees with it. She will call with any problems that may develop before the next visit here.  Derek Jack, MD [facility] [phone]   I, Thana Ates, am acting as a scribe for Dr. Derek Jack.  I, Derek Jack MD, have reviewed the above documentation for accuracy and completeness, and I agree with the above.

## 2021-04-19 ENCOUNTER — Inpatient Hospital Stay (HOSPITAL_COMMUNITY): Payer: Medicare HMO | Admitting: Hematology

## 2021-04-19 VITALS — BP 164/78 | HR 70 | Temp 97.2°F | Resp 18

## 2021-04-19 DIAGNOSIS — C50411 Malignant neoplasm of upper-outer quadrant of right female breast: Secondary | ICD-10-CM | POA: Diagnosis not present

## 2021-04-19 DIAGNOSIS — Z170 Estrogen receptor positive status [ER+]: Secondary | ICD-10-CM

## 2021-04-19 DIAGNOSIS — M8580 Other specified disorders of bone density and structure, unspecified site: Secondary | ICD-10-CM

## 2021-04-19 DIAGNOSIS — Z79811 Long term (current) use of aromatase inhibitors: Secondary | ICD-10-CM | POA: Diagnosis not present

## 2021-04-19 DIAGNOSIS — E559 Vitamin D deficiency, unspecified: Secondary | ICD-10-CM | POA: Diagnosis not present

## 2021-04-19 DIAGNOSIS — Z923 Personal history of irradiation: Secondary | ICD-10-CM | POA: Diagnosis not present

## 2021-04-19 DIAGNOSIS — Z7982 Long term (current) use of aspirin: Secondary | ICD-10-CM | POA: Diagnosis not present

## 2021-04-19 DIAGNOSIS — Z87891 Personal history of nicotine dependence: Secondary | ICD-10-CM | POA: Diagnosis not present

## 2021-04-19 DIAGNOSIS — Z79899 Other long term (current) drug therapy: Secondary | ICD-10-CM | POA: Diagnosis not present

## 2021-04-19 DIAGNOSIS — Z853 Personal history of malignant neoplasm of breast: Secondary | ICD-10-CM | POA: Diagnosis not present

## 2021-04-19 NOTE — Patient Instructions (Signed)
[place] at [facility] Discharge Instructions  You were seen and examined today by Dr. Delton Coombes. He reviewed your most recent labs and everything looks good. He wants you to continue taking the Vitamin D and Calcium over the counter. Please follow up as scheduled.   Thank you for choosing [place] at [facility] to provide your oncology and hematology care.  To afford each patient quality time with our provider, please arrive at least 15 minutes before your scheduled appointment time.   If you have a lab appointment with the [facility] please come in thru the Main Entrance and check in at the main information desk.  You need to re-schedule your appointment should you arrive 10 or more minutes late.  We strive to give you quality time with our providers, and arriving late affects you and other patients whose appointments are after yours.  Also, if you no show three or more times for appointments you may be dismissed from the clinic at the providers discretion.     Again, thank you for choosing [facility].  Our hope is that these requests will decrease the amount of time that you wait before being seen by our physicians.       _____________________________________________________________  Should you have questions after your visit to [facility], please contact our office at [phone] and follow the prompts.  Our office hours are 8:00 a.m. and 4:30 p.m. Monday - Friday.  Please note that voicemails left after 4:00 p.m. may not be returned until the following business day.  We are closed weekends and major holidays.  You do have access to a nurse 24-7, just call the main number to the clinic [phone] and do not press any options, hold on the line and a nurse will answer the phone.    For prescription refill requests, have your pharmacy contact our office and allow 72 hours.    Due to Covid, you will need to  wear a mask upon entering the hospital. If you do not have a mask, a mask will be given to you at the Main Entrance upon arrival. For doctor visits, patients may have 1 support person age 61 or older with them. For treatment visits, patients can not have anyone with them due to social distancing guidelines and our immunocompromised population.

## 2021-04-19 NOTE — Progress Notes (Signed)
.  Patient is taking Letrozole as prescribed.  Pt has not missed any doses and reports no side effects at this time.

## 2021-05-23 ENCOUNTER — Other Ambulatory Visit (HOSPITAL_COMMUNITY): Payer: Self-pay | Admitting: Hematology and Oncology

## 2021-05-28 DIAGNOSIS — I10 Essential (primary) hypertension: Secondary | ICD-10-CM | POA: Diagnosis not present

## 2021-05-28 DIAGNOSIS — G20 Parkinson's disease: Secondary | ICD-10-CM | POA: Diagnosis not present

## 2021-05-28 DIAGNOSIS — M130 Polyarthritis, unspecified: Secondary | ICD-10-CM | POA: Diagnosis not present

## 2021-05-28 DIAGNOSIS — R2689 Other abnormalities of gait and mobility: Secondary | ICD-10-CM | POA: Diagnosis not present

## 2021-05-28 DIAGNOSIS — G4700 Insomnia, unspecified: Secondary | ICD-10-CM | POA: Diagnosis not present

## 2021-05-28 DIAGNOSIS — G249 Dystonia, unspecified: Secondary | ICD-10-CM | POA: Diagnosis not present

## 2021-05-31 DIAGNOSIS — Z23 Encounter for immunization: Secondary | ICD-10-CM | POA: Diagnosis not present

## 2021-05-31 DIAGNOSIS — I10 Essential (primary) hypertension: Secondary | ICD-10-CM | POA: Diagnosis not present

## 2021-08-20 ENCOUNTER — Other Ambulatory Visit: Payer: Self-pay

## 2021-08-20 ENCOUNTER — Inpatient Hospital Stay (HOSPITAL_COMMUNITY): Payer: Medicare PPO | Attending: Hematology

## 2021-08-20 DIAGNOSIS — M8580 Other specified disorders of bone density and structure, unspecified site: Secondary | ICD-10-CM | POA: Insufficient documentation

## 2021-08-20 DIAGNOSIS — C50411 Malignant neoplasm of upper-outer quadrant of right female breast: Secondary | ICD-10-CM | POA: Insufficient documentation

## 2021-08-20 DIAGNOSIS — Z79811 Long term (current) use of aromatase inhibitors: Secondary | ICD-10-CM | POA: Insufficient documentation

## 2021-08-20 DIAGNOSIS — Z7982 Long term (current) use of aspirin: Secondary | ICD-10-CM | POA: Insufficient documentation

## 2021-08-20 DIAGNOSIS — Z800 Family history of malignant neoplasm of digestive organs: Secondary | ICD-10-CM | POA: Diagnosis not present

## 2021-08-20 DIAGNOSIS — Z79899 Other long term (current) drug therapy: Secondary | ICD-10-CM | POA: Diagnosis not present

## 2021-08-20 DIAGNOSIS — Z87891 Personal history of nicotine dependence: Secondary | ICD-10-CM | POA: Insufficient documentation

## 2021-08-20 DIAGNOSIS — Z170 Estrogen receptor positive status [ER+]: Secondary | ICD-10-CM

## 2021-08-20 DIAGNOSIS — Z923 Personal history of irradiation: Secondary | ICD-10-CM | POA: Insufficient documentation

## 2021-08-20 DIAGNOSIS — E559 Vitamin D deficiency, unspecified: Secondary | ICD-10-CM

## 2021-08-20 DIAGNOSIS — Z853 Personal history of malignant neoplasm of breast: Secondary | ICD-10-CM | POA: Insufficient documentation

## 2021-08-20 LAB — COMPREHENSIVE METABOLIC PANEL
ALT: 6 U/L (ref 0–44)
AST: 32 U/L (ref 15–41)
Albumin: 4.2 g/dL (ref 3.5–5.0)
Alkaline Phosphatase: 100 U/L (ref 38–126)
Anion gap: 8 (ref 5–15)
BUN: 10 mg/dL (ref 8–23)
CO2: 30 mmol/L (ref 22–32)
Calcium: 9.7 mg/dL (ref 8.9–10.3)
Chloride: 103 mmol/L (ref 98–111)
Creatinine, Ser: 0.7 mg/dL (ref 0.44–1.00)
GFR, Estimated: >60 mL/min (ref >60)
Glucose, Bld: 83 mg/dL (ref 70–99)
Potassium: 3.6 mmol/L (ref 3.5–5.1)
Sodium: 141 mmol/L (ref 135–145)
Total Bilirubin: 0.3 mg/dL (ref 0.3–1.2)
Total Protein: 8.3 g/dL — ABNORMAL HIGH (ref 6.5–8.1)

## 2021-08-20 LAB — CBC WITH DIFFERENTIAL/PLATELET
Abs Immature Granulocytes: 0.01 10*3/uL (ref 0.00–0.07)
Basophils Absolute: 0 10*3/uL (ref 0.0–0.1)
Basophils Relative: 1 %
Eosinophils Absolute: 0.2 10*3/uL (ref 0.0–0.5)
Eosinophils Relative: 4 %
HCT: 40.1 % (ref 36.0–46.0)
Hemoglobin: 12.5 g/dL (ref 12.0–15.0)
Immature Granulocytes: 0 %
Lymphocytes Relative: 20 %
Lymphs Abs: 0.9 10*3/uL (ref 0.7–4.0)
MCH: 28.2 pg (ref 26.0–34.0)
MCHC: 31.2 g/dL (ref 30.0–36.0)
MCV: 90.3 fL (ref 80.0–100.0)
Monocytes Absolute: 0.3 10*3/uL (ref 0.1–1.0)
Monocytes Relative: 6 %
Neutro Abs: 3 10*3/uL (ref 1.7–7.7)
Neutrophils Relative %: 69 %
Platelets: 292 10*3/uL (ref 150–400)
RBC: 4.44 MIL/uL (ref 3.87–5.11)
RDW: 12.8 % (ref 11.5–15.5)
WBC: 4.3 10*3/uL (ref 4.0–10.5)
nRBC: 0 % (ref 0.0–0.2)

## 2021-08-20 LAB — VITAMIN D 25 HYDROXY (VIT D DEFICIENCY, FRACTURES): Vit D, 25-Hydroxy: 26 ng/mL — ABNORMAL LOW (ref 30–100)

## 2021-08-27 ENCOUNTER — Other Ambulatory Visit: Payer: Self-pay

## 2021-08-27 ENCOUNTER — Other Ambulatory Visit (HOSPITAL_COMMUNITY): Payer: Self-pay | Admitting: Hematology

## 2021-08-27 ENCOUNTER — Inpatient Hospital Stay (HOSPITAL_BASED_OUTPATIENT_CLINIC_OR_DEPARTMENT_OTHER): Payer: Medicare PPO | Admitting: Hematology

## 2021-08-27 VITALS — BP 163/79 | HR 67 | Temp 96.8°F | Resp 17

## 2021-08-27 DIAGNOSIS — C50411 Malignant neoplasm of upper-outer quadrant of right female breast: Secondary | ICD-10-CM | POA: Diagnosis not present

## 2021-08-27 DIAGNOSIS — Z79899 Other long term (current) drug therapy: Secondary | ICD-10-CM | POA: Diagnosis not present

## 2021-08-27 DIAGNOSIS — Z170 Estrogen receptor positive status [ER+]: Secondary | ICD-10-CM | POA: Diagnosis not present

## 2021-08-27 DIAGNOSIS — M8580 Other specified disorders of bone density and structure, unspecified site: Secondary | ICD-10-CM | POA: Diagnosis not present

## 2021-08-27 DIAGNOSIS — E559 Vitamin D deficiency, unspecified: Secondary | ICD-10-CM

## 2021-08-27 DIAGNOSIS — Z923 Personal history of irradiation: Secondary | ICD-10-CM | POA: Diagnosis not present

## 2021-08-27 DIAGNOSIS — Z87891 Personal history of nicotine dependence: Secondary | ICD-10-CM | POA: Diagnosis not present

## 2021-08-27 DIAGNOSIS — Z79811 Long term (current) use of aromatase inhibitors: Secondary | ICD-10-CM | POA: Diagnosis not present

## 2021-08-27 DIAGNOSIS — Z7982 Long term (current) use of aspirin: Secondary | ICD-10-CM | POA: Diagnosis not present

## 2021-08-27 DIAGNOSIS — Z853 Personal history of malignant neoplasm of breast: Secondary | ICD-10-CM | POA: Diagnosis not present

## 2021-08-27 DIAGNOSIS — Z9889 Other specified postprocedural states: Secondary | ICD-10-CM

## 2021-08-27 NOTE — Progress Notes (Signed)
? ?[name] ?[address] ?[place], [place] [address] ? ? ?Patient Care Team: ?[name], MD as PCP - General (Cardiology) ?Arnoldo Lenis, MD as PCP - Cardiology (Cardiology) ?Brien Mates, RN as Oncology Nurse Navigator (Oncology) ? ?SUMMARY OF ONCOLOGIC HISTORY: ?Oncology History  ? No history exists.  ? ? ?CHIEF COMPLIANT: Follow-up of right breast cancer ? ? ?INTERVAL HISTORY: Lisa Torres is a 76 y.o. female here today for follow up of her right breast cancer. Her last visit was on 04/19/2021.  ? ?Today she reports feeling fair. She is taking anastrozole and tolerating it well. She denies hot flashes. She has pain in her left leg which she reports started recently. She reports a lump on her upper back. She is taking 1 calcium and vitamin D tablet daily.  ? ?REVIEW OF SYSTEMS:   ?Review of Systems  ?Constitutional:  Negative for appetite change and fatigue.  ?HENT:   Positive for lump/mass (upper back).   ?Endocrine: Negative for hot flashes.  ?Musculoskeletal:  Positive for arthralgias (5/10 L leg) and flank pain (L).  ?Neurological:  Positive for headaches and numbness.  ?Psychiatric/Behavioral:  Positive for sleep disturbance.   ?All other systems reviewed and are negative. ? ?I have reviewed the past medical history, past surgical history, social history and family history with the patient and they are unchanged from previous note. ? ? ?ALLERGIES:   ?is allergic to hydrocodone and tramadol. ? ? ?MEDICATIONS:  ?Current Outpatient Medications  ?Medication Sig Dispense Refill  ? amLODipine (NORVASC) 5 MG tablet Take 5 mg by mouth daily.     ? aspirin 325 MG EC tablet Take 1 tablet (325 mg total) by mouth 2 (two) times daily. 30 tablet 0  ? B Complex Vitamins (B COMPLEX PO) Place 1 tablet under the tongue daily.    ? baclofen (LIORESAL) 10 MG tablet Take 10 mg by mouth at bedtime.     ? calcium-vitamin D (OSCAL WITH D) 500-200 MG-UNIT tablet Take 1 tablet by mouth daily  with breakfast. 90 tablet 1  ? carbidopa-levodopa (SINEMET IR) 25-100 MG tablet Take 2 tablets by mouth 3 (three) times daily.    ? diazepam (VALIUM) 5 MG tablet Take 5 mg by mouth 2 (two) times daily.    ? ergocalciferol (VITAMIN D2) 1.25 MG (50000 UT) capsule Take by mouth.    ? hydrocortisone cream 0.5 % Apply 1 application topically 2 (two) times daily. 30 g 0  ? Istradefylline (NOURIANZ) 20 MG TABS Take 20 mg by mouth daily.    ? letrozole (FEMARA) 2.5 MG tablet TAKE 1 TABLET BY MOUTH EVERY DAY 90 tablet 1  ? levothyroxine (SYNTHROID, LEVOTHROID) 100 MCG tablet Take 100 mcg by mouth daily before breakfast.    ? pantoprazole (PROTONIX) 40 MG tablet Take 1 tablet (40 mg total) by mouth daily before breakfast. 90 tablet 3  ? polyethylene glycol powder (MIRALAX) 17 GM/SCOOP powder Take 17 g by mouth daily. (Patient taking differently: Take 17 g by mouth daily as needed (constipation.).) 850 g 1  ? traZODone (DESYREL) 50 MG tablet Take 100 mg by mouth at bedtime.     ? ?No current facility-administered medications for this visit.  ? ? ? ?PHYSICAL EXAMINATION: ?Performance status (ECOG): 2 - Symptomatic, <50% confined to bed ? ?Vitals:  ? 08/27/21 1525  ?BP: (!) 163/79  ?Pulse: 67  ?Resp: 17  ?Temp: (!) 96.8 ?F (36 ?C)  ?SpO2: 100%  ? ?Wt  Readings from Last 3 Encounters:  ?11/09/20 165 lb (74.8 kg)  ?11/01/20 160 lb (72.6 kg)  ?10/19/20 166 lb (75.3 kg)  ? ?Physical Exam ?Vitals reviewed.  ?Constitutional:   ?   Appearance: Normal appearance.  ?   Comments: In wheelchair  ?Cardiovascular:  ?   Rate and Rhythm: Normal rate and regular rhythm.  ?   Pulses: Normal pulses.  ?   Heart sounds: Normal heart sounds.  ?Pulmonary:  ?   Effort: Pulmonary effort is normal.  ?   Breath sounds: Normal breath sounds.  ?Chest:  ?Breasts: ?   Right: No swelling, bleeding, inverted nipple, mass, nipple discharge, skin change (UOQ lumpectomy scar WNL) or tenderness.  ?Lymphadenopathy:  ?   Upper Body:  ?   Right upper body: No axillary  adenopathy.  ?   Left upper body: No axillary adenopathy.  ?Skin: ?   Comments: Lipoma inbetween the scapula  ?Neurological:  ?   General: No focal deficit present.  ?   Mental Status: She is alert and oriented to person, place, and time.  ?Psychiatric:     ?   Mood and Affect: Mood normal.     ?   Behavior: Behavior normal.  ? ? ?Breast Exam Chaperone: Thana Ates   ? ? ?LABORATORY DATA:  ?I have reviewed the data as listed ?CMP Latest Ref Rng & Units 08/20/2021 04/18/2021 01/01/2021  ?Glucose 70 - 99 mg/dL 83 89 90  ?BUN 8 - 23 mg/dL _0 ?Creatinine 0.44 - 1.00 mg/dL 0.70 0.89 0.84  ?Sodium 135 - 145 mmol/L 141 140 138  ?Potassium 3.5 - 5.1 mmol/L 3.6 4.2 3.9  ?Chloride 98 - 111 mmol/L 103 106 102  ?CO2 22 - 32 mmol/L _1 ?Calcium 8.9 - 10.3 mg/dL 9.7 9.2 9.0  ?Total Protein 6.5 - 8.1 g/dL 8.3(H) 7.9 7.9  ?Total Bilirubin 0.3 - 1.2 mg/dL 0.3 0.6 0.7  ?Alkaline Phos 38 - 126 U/L 100 106 104  ?AST 15 - 41 U/L 32 18 18  ?ALT 0 - 44 U/L 6 <5 5  ? ?No results found for: VVO160 ?Lab Results  ?Component Value Date  ? WBC 4.3 08/20/2021  ? HGB 12.5 08/20/2021  ? HCT 40.1 08/20/2021  ? MCV 90.3 08/20/2021  ? PLT 292 08/20/2021  ? NEUTROABS 3.0 08/20/2021  ? ? ?ASSESSMENT:  ?1.  Right breast IDC in the upper outer quadrant, (T1CN0) ER/PR positive, HER-2 negative: ?- Screening mammogram on 08/07/2020 BI-RADS Category 0. ?- Right breast mammogram and ultrasound on 09/01/2020 shows hypoechoic mass with irregular shape and borders at the 10 o'clock position of the right breast, 7 cm from the nipple measuring 0.8 x 1 x 1.3 cm.  Ultrasound of the right axilla was normal. ?- Biopsy of the right breast 10 o'clock position consistent with IDC, ER 95% positive, PR 90% positive, Ki-67 5%, HER2 2+ by IHC, negative by FISH. ?- Right lumpectomy on 11/03/2020 with 1.6 cm invasive ductal carcinoma, grade 1.  Resection margins negative.  Closest margin is 1 mm, superior margin.  PT1CPNX. ?- Anastrozole started on 12/05/2020. ?  ?2.   Stage I left breast cancer: ?- Diagnosed 01/07/2012, ER/PR positive, status post lumpectomy and SLNB. ?- XRT completed in November 2013. ?- Took 5 years of anastrozole. ?  ?3.  Social/family history: ?- She lives with husband at home and is independent of ADLs.  Walks with help of walker.  Denies any falls. ?- Quit smoking in  2000.  Smoked 3 to 4 cigarettes/day for many years. ?- Brother had pancreatic cancer.  Mother had colon cancer.  No family history of breast cancers. ? ? ?PLAN:  ?1.  Stage I (PT1CPNX) right breast upper outer quadrant IDC, ER/PR positive, HER2 negative: ?- She is tolerating anastrozole reasonably well.  No major hot flashes or musculoskeletal symptoms. ?- Examination shows right breast upper outer quadrant lumpectomy scar is within normal limits with no palpable masses.  Labs reviewed by me shows normal LFTs and CBC. ?- Recommend bilateral diagnostic mammogram. ?- She reports a lump in the upper mid back.  There is a subcutaneous mass with slip sign consistent with lipoma. ?- RTC 6 months for follow-up with repeat labs. ?  ?2.  Osteopenia: ?- DEXA scan on 11/20/2020 with T score -1.6, osteopenia. ?- Vitamin D level is 26.  I have told her to increase vitamin D to 2 tablets daily. ? ?Breast Cancer therapy associated bone loss: I have recommended calcium, Vitamin D and weight bearing exercises. ? ?Orders placed this encounter:  ?No orders of the defined types were placed in this encounter. ? ? ?The patient has a good understanding of the overall plan. She agrees with it. She will call with any problems that may develop before the next visit here. ? ?Derek Jack, MD ?[facility] ?[phone] ? ? ?I, Thana Ates, am acting as a scribe for Dr. Derek Jack. ? ?I, Derek Jack MD, have reviewed the above documentation for accuracy and completeness, and I agree with the above. ?  ? ? ?

## 2021-08-27 NOTE — Patient Instructions (Addendum)
[place] at [facility] ?Discharge Instructions ? ?You were seen and examined today by Dr. Delton Coombes. He reviewed your most recent labs and everything looks okay. Your Vitamin D is low, increase Vitamin D and Calcium to twice daily. Please keep follow up appointment as scheduled in 6 months. ? ? ?Thank you for choosing [place] at [facility] to provide your oncology and hematology care.  To afford each patient quality time with our provider, please arrive at least 15 minutes before your scheduled appointment time.  ? ?If you have a lab appointment with the [facility] please come in thru the Main Entrance and check in at the main information desk. ? ?You need to re-schedule your appointment should you arrive 10 or more minutes late.  We strive to give you quality time with our providers, and arriving late affects you and other patients whose appointments are after yours.  Also, if you no show three or more times for appointments you may be dismissed from the clinic at the providers discretion.     ?Again, thank you for choosing [facility].  Our hope is that these requests will decrease the amount of time that you wait before being seen by our physicians.       ?_____________________________________________________________ ? ?Should you have questions after your visit to [facility], please contact our office at [phone] and follow the prompts.  Our office hours are 8:00 a.m. and 4:30 p.m. Monday - Friday.  Please note that voicemails left after 4:00 p.m. may not be returned until the following business day.  We are closed weekends and major holidays.  You do have access to a nurse 24-7, just call the main number to the clinic [phone] and do not press any options, hold on the line and a nurse will answer the phone.   ? ?For prescription refill requests, have your pharmacy contact our office and allow 72 hours.   ? ?Due to  Covid, you will need to wear a mask upon entering the hospital. If you do not have a mask, a mask will be given to you at the Main Entrance upon arrival. For doctor visits, patients may have 1 support person age 23 or older with them. For treatment visits, patients can not have anyone with them due to social distancing guidelines and our immunocompromised population.  ? ?  ?

## 2021-08-28 ENCOUNTER — Ambulatory Visit (HOSPITAL_COMMUNITY): Payer: Medicare PPO

## 2021-08-30 DIAGNOSIS — N390 Urinary tract infection, site not specified: Secondary | ICD-10-CM | POA: Diagnosis not present

## 2021-08-30 DIAGNOSIS — R030 Elevated blood-pressure reading, without diagnosis of hypertension: Secondary | ICD-10-CM | POA: Diagnosis not present

## 2021-08-30 DIAGNOSIS — R350 Frequency of micturition: Secondary | ICD-10-CM | POA: Diagnosis not present

## 2021-09-25 ENCOUNTER — Ambulatory Visit (HOSPITAL_COMMUNITY)
Admission: RE | Admit: 2021-09-25 | Discharge: 2021-09-25 | Disposition: A | Discharge status: Home / Self Care | Payer: Medicare Other | Source: Ambulatory Visit | Attending: Hematology | Admitting: Hematology

## 2021-09-25 DIAGNOSIS — Z9889 Other specified postprocedural states: Secondary | ICD-10-CM | POA: Insufficient documentation

## 2021-09-25 DIAGNOSIS — M8580 Other specified disorders of bone density and structure, unspecified site: Secondary | ICD-10-CM | POA: Diagnosis present

## 2021-09-25 DIAGNOSIS — Z170 Estrogen receptor positive status [ER+]: Secondary | ICD-10-CM | POA: Insufficient documentation

## 2021-09-25 DIAGNOSIS — E559 Vitamin D deficiency, unspecified: Secondary | ICD-10-CM | POA: Diagnosis present

## 2021-09-25 DIAGNOSIS — C50411 Malignant neoplasm of upper-outer quadrant of right female breast: Secondary | ICD-10-CM

## 2021-11-24 ENCOUNTER — Other Ambulatory Visit (HOSPITAL_COMMUNITY): Payer: Self-pay | Admitting: Hematology

## 2021-12-10 DIAGNOSIS — I10 Essential (primary) hypertension: Secondary | ICD-10-CM | POA: Diagnosis not present

## 2021-12-10 DIAGNOSIS — M130 Polyarthritis, unspecified: Secondary | ICD-10-CM | POA: Diagnosis not present

## 2021-12-10 DIAGNOSIS — R2689 Other abnormalities of gait and mobility: Secondary | ICD-10-CM | POA: Diagnosis not present

## 2021-12-10 DIAGNOSIS — G20 Parkinson's disease: Secondary | ICD-10-CM | POA: Diagnosis not present

## 2021-12-31 DIAGNOSIS — G4700 Insomnia, unspecified: Secondary | ICD-10-CM | POA: Diagnosis not present

## 2021-12-31 DIAGNOSIS — R7301 Impaired fasting glucose: Secondary | ICD-10-CM | POA: Diagnosis not present

## 2021-12-31 DIAGNOSIS — G20 Parkinson's disease: Secondary | ICD-10-CM | POA: Diagnosis not present

## 2021-12-31 DIAGNOSIS — E039 Hypothyroidism, unspecified: Secondary | ICD-10-CM | POA: Diagnosis not present

## 2021-12-31 DIAGNOSIS — M25569 Pain in unspecified knee: Secondary | ICD-10-CM | POA: Diagnosis not present

## 2021-12-31 DIAGNOSIS — K219 Gastro-esophageal reflux disease without esophagitis: Secondary | ICD-10-CM | POA: Diagnosis not present

## 2021-12-31 DIAGNOSIS — I10 Essential (primary) hypertension: Secondary | ICD-10-CM | POA: Diagnosis not present

## 2022-01-15 DIAGNOSIS — M25579 Pain in unspecified ankle and joints of unspecified foot: Secondary | ICD-10-CM | POA: Diagnosis not present

## 2022-01-15 DIAGNOSIS — M79671 Pain in right foot: Secondary | ICD-10-CM | POA: Diagnosis not present

## 2022-01-15 DIAGNOSIS — B351 Tinea unguium: Secondary | ICD-10-CM | POA: Diagnosis not present

## 2022-02-05 DIAGNOSIS — N390 Urinary tract infection, site not specified: Secondary | ICD-10-CM | POA: Diagnosis not present

## 2022-02-22 DIAGNOSIS — M25571 Pain in right ankle and joints of right foot: Secondary | ICD-10-CM | POA: Diagnosis not present

## 2022-02-22 DIAGNOSIS — M79671 Pain in right foot: Secondary | ICD-10-CM | POA: Diagnosis not present

## 2022-03-07 DIAGNOSIS — G5781 Other specified mononeuropathies of right lower limb: Secondary | ICD-10-CM | POA: Diagnosis not present

## 2022-03-19 DIAGNOSIS — B351 Tinea unguium: Secondary | ICD-10-CM | POA: Diagnosis not present

## 2022-03-22 ENCOUNTER — Other Ambulatory Visit: Payer: Self-pay

## 2022-03-25 ENCOUNTER — Inpatient Hospital Stay: Payer: PPO | Attending: Hematology

## 2022-03-25 DIAGNOSIS — Z79811 Long term (current) use of aromatase inhibitors: Secondary | ICD-10-CM | POA: Insufficient documentation

## 2022-03-25 DIAGNOSIS — Z7982 Long term (current) use of aspirin: Secondary | ICD-10-CM | POA: Insufficient documentation

## 2022-03-25 DIAGNOSIS — M21262 Flexion deformity, left knee: Secondary | ICD-10-CM | POA: Diagnosis not present

## 2022-03-25 DIAGNOSIS — Z853 Personal history of malignant neoplasm of breast: Secondary | ICD-10-CM | POA: Insufficient documentation

## 2022-03-25 DIAGNOSIS — M216X1 Other acquired deformities of right foot: Secondary | ICD-10-CM | POA: Diagnosis not present

## 2022-03-25 DIAGNOSIS — C50411 Malignant neoplasm of upper-outer quadrant of right female breast: Secondary | ICD-10-CM | POA: Insufficient documentation

## 2022-03-25 DIAGNOSIS — Z79899 Other long term (current) drug therapy: Secondary | ICD-10-CM | POA: Insufficient documentation

## 2022-03-25 DIAGNOSIS — Z87891 Personal history of nicotine dependence: Secondary | ICD-10-CM | POA: Insufficient documentation

## 2022-03-25 DIAGNOSIS — M8580 Other specified disorders of bone density and structure, unspecified site: Secondary | ICD-10-CM | POA: Insufficient documentation

## 2022-03-25 DIAGNOSIS — Z170 Estrogen receptor positive status [ER+]: Secondary | ICD-10-CM | POA: Insufficient documentation

## 2022-03-25 DIAGNOSIS — Z923 Personal history of irradiation: Secondary | ICD-10-CM | POA: Insufficient documentation

## 2022-03-25 DIAGNOSIS — Z800 Family history of malignant neoplasm of digestive organs: Secondary | ICD-10-CM | POA: Insufficient documentation

## 2022-03-25 DIAGNOSIS — M24561 Contracture, right knee: Secondary | ICD-10-CM | POA: Diagnosis not present

## 2022-03-29 DIAGNOSIS — R7301 Impaired fasting glucose: Secondary | ICD-10-CM | POA: Diagnosis not present

## 2022-03-29 DIAGNOSIS — E039 Hypothyroidism, unspecified: Secondary | ICD-10-CM | POA: Diagnosis not present

## 2022-03-29 DIAGNOSIS — E559 Vitamin D deficiency, unspecified: Secondary | ICD-10-CM | POA: Diagnosis not present

## 2022-03-29 DIAGNOSIS — E876 Hypokalemia: Secondary | ICD-10-CM | POA: Diagnosis not present

## 2022-03-29 DIAGNOSIS — K219 Gastro-esophageal reflux disease without esophagitis: Secondary | ICD-10-CM | POA: Diagnosis not present

## 2022-03-29 DIAGNOSIS — I10 Essential (primary) hypertension: Secondary | ICD-10-CM | POA: Diagnosis not present

## 2022-03-29 DIAGNOSIS — R5383 Other fatigue: Secondary | ICD-10-CM | POA: Diagnosis not present

## 2022-03-29 DIAGNOSIS — E782 Mixed hyperlipidemia: Secondary | ICD-10-CM | POA: Diagnosis not present

## 2022-03-29 DIAGNOSIS — E7849 Other hyperlipidemia: Secondary | ICD-10-CM | POA: Diagnosis not present

## 2022-04-01 ENCOUNTER — Ambulatory Visit: Payer: Medicare PPO | Admitting: Hematology

## 2022-04-03 DIAGNOSIS — E039 Hypothyroidism, unspecified: Secondary | ICD-10-CM | POA: Diagnosis not present

## 2022-04-03 DIAGNOSIS — M25569 Pain in unspecified knee: Secondary | ICD-10-CM | POA: Diagnosis not present

## 2022-04-03 DIAGNOSIS — Z8669 Personal history of other diseases of the nervous system and sense organs: Secondary | ICD-10-CM | POA: Diagnosis not present

## 2022-04-03 DIAGNOSIS — G4700 Insomnia, unspecified: Secondary | ICD-10-CM | POA: Diagnosis not present

## 2022-04-03 DIAGNOSIS — I10 Essential (primary) hypertension: Secondary | ICD-10-CM | POA: Diagnosis not present

## 2022-04-03 DIAGNOSIS — Z23 Encounter for immunization: Secondary | ICD-10-CM | POA: Diagnosis not present

## 2022-04-03 DIAGNOSIS — K219 Gastro-esophageal reflux disease without esophagitis: Secondary | ICD-10-CM | POA: Diagnosis not present

## 2022-04-03 DIAGNOSIS — R7301 Impaired fasting glucose: Secondary | ICD-10-CM | POA: Diagnosis not present

## 2022-04-16 ENCOUNTER — Inpatient Hospital Stay: Payer: PPO

## 2022-04-16 DIAGNOSIS — M8580 Other specified disorders of bone density and structure, unspecified site: Secondary | ICD-10-CM | POA: Diagnosis not present

## 2022-04-16 DIAGNOSIS — Z923 Personal history of irradiation: Secondary | ICD-10-CM | POA: Diagnosis not present

## 2022-04-16 DIAGNOSIS — Z79811 Long term (current) use of aromatase inhibitors: Secondary | ICD-10-CM | POA: Diagnosis not present

## 2022-04-16 DIAGNOSIS — Z170 Estrogen receptor positive status [ER+]: Secondary | ICD-10-CM | POA: Diagnosis not present

## 2022-04-16 DIAGNOSIS — Z800 Family history of malignant neoplasm of digestive organs: Secondary | ICD-10-CM | POA: Diagnosis not present

## 2022-04-16 DIAGNOSIS — C50411 Malignant neoplasm of upper-outer quadrant of right female breast: Secondary | ICD-10-CM | POA: Diagnosis not present

## 2022-04-16 DIAGNOSIS — Z853 Personal history of malignant neoplasm of breast: Secondary | ICD-10-CM | POA: Diagnosis not present

## 2022-04-16 DIAGNOSIS — Z79899 Other long term (current) drug therapy: Secondary | ICD-10-CM | POA: Diagnosis not present

## 2022-04-16 DIAGNOSIS — E559 Vitamin D deficiency, unspecified: Secondary | ICD-10-CM

## 2022-04-16 DIAGNOSIS — Z87891 Personal history of nicotine dependence: Secondary | ICD-10-CM | POA: Diagnosis not present

## 2022-04-16 DIAGNOSIS — Z7982 Long term (current) use of aspirin: Secondary | ICD-10-CM | POA: Diagnosis not present

## 2022-04-16 LAB — CBC WITH DIFFERENTIAL/PLATELET
Abs Immature Granulocytes: 0.01 10*3/uL (ref 0.00–0.07)
Basophils Absolute: 0 10*3/uL (ref 0.0–0.1)
Basophils Relative: 1 %
Eosinophils Absolute: 0.1 10*3/uL (ref 0.0–0.5)
Eosinophils Relative: 3 %
HCT: 41.5 % (ref 36.0–46.0)
Hemoglobin: 13.3 g/dL (ref 12.0–15.0)
Immature Granulocytes: 0 %
Lymphocytes Relative: 32 %
Lymphs Abs: 1.2 10*3/uL (ref 0.7–4.0)
MCH: 28.5 pg (ref 26.0–34.0)
MCHC: 32 g/dL (ref 30.0–36.0)
MCV: 89.1 fL (ref 80.0–100.0)
Monocytes Absolute: 0.3 10*3/uL (ref 0.1–1.0)
Monocytes Relative: 7 %
Neutro Abs: 2.1 10*3/uL (ref 1.7–7.7)
Neutrophils Relative %: 57 %
Platelets: 195 10*3/uL (ref 150–400)
RBC: 4.66 MIL/uL (ref 3.87–5.11)
RDW: 12.1 % (ref 11.5–15.5)
WBC: 3.6 10*3/uL — ABNORMAL LOW (ref 4.0–10.5)
nRBC: 0 % (ref 0.0–0.2)

## 2022-04-16 LAB — COMPREHENSIVE METABOLIC PANEL
ALT: 5 U/L (ref 0–44)
AST: 21 U/L (ref 15–41)
Albumin: 4.3 g/dL (ref 3.5–5.0)
Alkaline Phosphatase: 98 U/L (ref 38–126)
Anion gap: 7 (ref 5–15)
BUN: 10 mg/dL (ref 8–23)
CO2: 27 mmol/L (ref 22–32)
Calcium: 9 mg/dL (ref 8.9–10.3)
Chloride: 104 mmol/L (ref 98–111)
Creatinine, Ser: 0.73 mg/dL (ref 0.44–1.00)
GFR, Estimated: >60 mL/min (ref >60)
Glucose, Bld: 87 mg/dL (ref 70–99)
Potassium: 4.4 mmol/L (ref 3.5–5.1)
Sodium: 138 mmol/L (ref 135–145)
Total Bilirubin: 0.6 mg/dL (ref 0.3–1.2)
Total Protein: 7.8 g/dL (ref 6.5–8.1)

## 2022-04-16 LAB — VITAMIN D 25 HYDROXY (VIT D DEFICIENCY, FRACTURES): Vit D, 25-Hydroxy: 33.47 ng/mL (ref 30–100)

## 2022-04-22 ENCOUNTER — Other Ambulatory Visit: Payer: Self-pay | Admitting: *Deleted

## 2022-04-22 MED ORDER — LETROZOLE 2.5 MG PO TABS: 2.5000 mg | ORAL_TABLET | Freq: Every day | ORAL | 1 refills | Status: DC

## 2022-04-22 NOTE — Progress Notes (Unsigned)
Lisa Torres, [place] [id number]   CLINIC:  Medical Oncology/Hematology  PCP:  [name], MD [address] [place] [address] [phone]   REASON FOR VISIT:  Follow-up for right breast cancer (2022) and left breast cancer (2013)  CURRENT THERAPY: Anastrozole  BRIEF ONCOLOGIC HISTORY:  Stage I right breast cancer: Diagnosed February 2022.  Right lumpectomy 11/03/2020.  Anastrozole started 12/05/2020. Stage I left breast cancer: Diagnosed July 2013, s/p lumpectomy and SLNB.  XRT completed November 2013.  Completed 5 years anastrozole.  CANCER STAGING: Cancer Staging  Breast cancer of upper-outer quadrant of right female breast [facility]) Staging form: Breast, AJCC 8th Edition - Clinical stage from 10/09/2020: cT1c, cN0, cM0, G1, ER+, HER2- - Unsigned   INTERVAL HISTORY:  [name], a 76 y.o. female, returns for routine follow-up of her history of right breast cancer (2022) and left breast cancer (2013). [name] was last seen on 08/27/2021 by Dr. Delton Coombes.   At today's visit, she  reports feeling fairly well.  She denies any recent hospitalizations, surgeries, or changes in her  baseline health status.  She denies any symptoms of recurrence such as new lumps, bone pain, chest pain, dyspnea, or abdominal pain.  She has no new headaches, seizures, or focal neurologic deficits.  No B symptoms such as fever, chills, night sweats, unintentional weight loss.  She continues to take letrozole daily, with mild hot flashes.  She is taking calcium and vitamin D supplements daily.  She reports 50% energy and 80% appetite.  She is maintaining stable weight at this time.   REVIEW OF SYSTEMS: Patient denies any acute complaints, reports the following are chronic and stable. Review of Systems  Constitutional:  Positive for fatigue. Negative for appetite change, chills, diaphoresis, fever and unexpected weight change.  HENT:   Negative for  lump/mass and nosebleeds.   Eyes:  Negative for eye problems.  Respiratory:  Positive for shortness of breath (With exertion). Negative for cough and hemoptysis.   Cardiovascular:  Negative for chest pain, leg swelling and palpitations.  Gastrointestinal:  Positive for constipation (Intermittent) and nausea (Occasional). Negative for abdominal pain, blood in stool, diarrhea and vomiting.  Genitourinary:  Positive for dysuria (Recently treated for UTI). Negative for hematuria.   Skin: Negative.   Neurological:  Positive for dizziness and headaches (Occasional). Negative for light-headedness.  Hematological:  Does not bruise/bleed easily.  Psychiatric/Behavioral:  Positive for sleep disturbance.     PAST MEDICAL/SURGICAL HISTORY:  Past Medical History:  Diagnosis Date   Breast cancer ([place]) 11/2011   Chronic back pain    Chronic leg pain    bilateral   Chronic leukopenia    DDD (degenerative disc disease), cervical    DDD (degenerative disc disease), lumbar    Gait abnormality    GERD (gastroesophageal reflux disease)    Hypertension    Hypothyroidism    Parkinson's disease ([place])    Radiation 04/2012   33 treatments for breast cancer   Right knee pain    Thyroid disease    Past Surgical History:  Procedure Laterality Date   ABDOMINAL HYSTERECTOMY     partial   BREAST LUMPECTOMY Left 12/2011   CHOLECYSTECTOMY     COLONOSCOPY N/A 07/01/2013   Procedure: COLONOSCOPY;  Surgeon: Rogene Houston, MD;  [facility];  Service: Endoscopy;  Laterality: N/A;  1030   COLONOSCOPY N/A 09/15/2019   Procedure: COLONOSCOPY;  Surgeon: Rogene Houston,  MD;  [facility];  Service: Endoscopy;  Laterality: N/A;  815   ESOPHAGOGASTRODUODENOSCOPY N/A 11/21/2017   Procedure: ESOPHAGOGASTRODUODENOSCOPY (EGD);  Surgeon: Rogene Houston, MD;  [facility];  Service: Endoscopy;  Laterality: N/A;  2:45   EXCISIONAL TOTAL KNEE ARTHROPLASTY WITH ANTIBIOTIC SPACERS Right  01/12/2018   Procedure: EXCISIONAL TOTAL KNEE ARTHROPLASTY WITH ANTIBIOTIC SPACERS;  Surgeon: Vickey Huger, MD;  Location: [facility];  Service: Orthopedics;  Laterality: Right;   JOINT REPLACEMENT  01/2017   right knee   KNEE SURGERY Right 01/12/2018   EXCISIONAL TOTAL KNEE ARTHROPLASTY WITH ANTIBIOTIC SPACERS   MASTECTOMY, PARTIAL Right 11/03/2020   Procedure: RIGHT PARTIAL MASTECTOMY AFTER TAG PLACEMENT;  Surgeon: Aviva Signs, MD;  Location: [facility];  Service: General;  Laterality: Right;   POLYPECTOMY  09/15/2019   Procedure: POLYPECTOMY;  Surgeon: Rogene Houston, MD;  [facility];  Service: Endoscopy;;  Cold Snare transverse colon polyp   TOTAL KNEE REVISION Right 03/23/2018   Procedure: RIGHT TOTAL KNEE REVISION;  Surgeon: Vickey Huger, MD;  Location: [facility];  Service: Orthopedics;  Laterality: Right;   TUBAL LIGATION      SOCIAL HISTORY:  Social History   Socioeconomic History   Marital status: Widowed    Spouse name: Lisa Torres   Number of children: 3   Years of education: 12 th   Highest education level: Not on file  Occupational History    Comment: retired  Tobacco Use   Smoking status: Former    Packs/day: 0.50    Years: 18.00    Total pack years: 9.00    Types: Cigarettes    Quit date: 09/30/1998    Years since quitting: 23.5   Smokeless tobacco: Never  Vaping Use   Vaping Use: Never used  Substance and Sexual Activity   Alcohol use: No   Drug use: No   Sexual activity: Not on file  Other Topics Concern   Not on file  Social History Narrative   Patient lives at home with her husband Lisa Torres).    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   Right handed   Social Determinants of Health   Financial Resource Strain: Low Risk  (10/05/2020)   Overall Financial Resource Strain (CARDIA)    Difficulty of Paying Living Expenses: Not very hard  Food Insecurity: No Food Insecurity (10/05/2020)   Hunger Vital Sign    Worried About Running Out of Food in the Last Year:  Never true    Ran Out of Food in the Last Year: Never true  Transportation Needs: No Transportation Needs (10/05/2020)   PRAPARE - Hydrologist (Medical): No    Lack of Transportation (Non-Medical): No  Physical Activity: Inactive (10/05/2020)   Exercise Vital Sign    Days of Exercise per Week: 0 days    Minutes of Exercise per Session: 0 min  Stress: No Stress Concern Present (10/05/2020)   [place]    Feeling of Stress : Only a little  Social Connections: Socially Integrated (10/05/2020)   Social Connection and Isolation Panel [NHANES]    Frequency of Communication with Friends and Family: More than three times a week    Frequency of Social Gatherings with Friends and Family: More than three times a week    Attends Religious Services: More than 4 times per year    Active Member of Clubs or Organizations: No    Attends  Club or Organization Meetings: More than 4 times per year    Marital Status: Married  Human resources officer Violence: Not At Risk (10/05/2020)   Humiliation, Afraid, Rape, and Kick questionnaire    Fear of Current or Ex-Partner: No    Emotionally Abused: No    Physically Abused: No    Sexually Abused: No    FAMILY HISTORY:  Family History  Problem Relation Age of Onset   Cancer Mother 60       colon cancer   Cancer Father 85       throat cancer   Pancreatic cancer Brother        [phone] 11-14-15    CURRENT MEDICATIONS:  Current Outpatient Medications  Medication Sig Dispense Refill   amLODipine (NORVASC) 5 MG tablet Take 5 mg by mouth daily.      aspirin 325 MG EC tablet Take 1 tablet (325 mg total) by mouth 2 (two) times daily. 30 tablet 0   B Complex Vitamins (B COMPLEX PO) Place 1 tablet under the tongue daily.     baclofen (LIORESAL) 10 MG tablet Take 10 mg by mouth at bedtime.      calcium-vitamin D (OSCAL WITH D) 500-200 MG-UNIT tablet Take 1 tablet by mouth daily with  breakfast. 90 tablet 1   carbidopa-levodopa (SINEMET IR) 25-100 MG tablet Take 2 tablets by mouth 3 (three) times daily.     diazepam (VALIUM) 5 MG tablet Take 5 mg by mouth 2 (two) times daily.     ergocalciferol (VITAMIN D2) 1.25 MG (50000 UT) capsule Take by mouth.     hydrocortisone cream 0.5 % Apply 1 application topically 2 (two) times daily. 30 g 0   Istradefylline (NOURIANZ) 20 MG TABS Take 20 mg by mouth daily.     letrozole (FEMARA) 2.5 MG tablet Take 1 tablet (2.5 mg total) by mouth daily. 90 tablet 1   levothyroxine (SYNTHROID, LEVOTHROID) 100 MCG tablet Take 100 mcg by mouth daily before breakfast.     pantoprazole (PROTONIX) 40 MG tablet Take 1 tablet (40 mg total) by mouth daily before breakfast. 90 tablet 3   polyethylene glycol powder (MIRALAX) 17 GM/SCOOP powder Take 17 g by mouth daily. (Patient taking differently: Take 17 g by mouth daily as needed (constipation.).) 850 g 1   traZODone (DESYREL) 50 MG tablet Take 100 mg by mouth at bedtime.      No current facility-administered medications for this visit.    ALLERGIES:  Allergies  Allergen Reactions   Hydrocodone Other (See Comments)    Heart pain  Delusions   Tramadol Other (See Comments)    Hallucinations, abd pain Delusions (intolerance)    PHYSICAL EXAM:  Performance status (ECOG): 2 - Symptomatic, <50% confined to bed  There were no vitals filed for this visit. Wt Readings from Last 3 Encounters:  11/09/20 165 lb (74.8 kg)  11/01/20 160 lb (72.6 kg)  10/19/20 166 lb (75.3 kg)   Physical Exam Constitutional:      Appearance: Normal appearance. She is obese.     Comments: Presents in wheelchair  HENT:     Head: Normocephalic and atraumatic.     Mouth/Throat:     Mouth: Mucous membranes are moist.  Eyes:     Extraocular Movements: Extraocular movements intact.     Pupils: Pupils are equal, round, and reactive to light.  Cardiovascular:     Rate and Rhythm: Normal rate and regular rhythm.      Pulses: Normal pulses.  Heart sounds: Normal heart sounds.  Pulmonary:     Effort: Pulmonary effort is normal.     Breath sounds: Normal breath sounds.  Chest:     Comments: Lumpectomy scars within normal limits.  No discrete nodules or palpable masses within either breast.  No palpable lymphadenopathy supraclavicular, axillary, epitrochlear, or pectoral lymph nodes.   Abdominal:     General: Bowel sounds are normal.     Palpations: Abdomen is soft.     Tenderness: There is no abdominal tenderness.  Musculoskeletal:        General: No swelling.     Right lower leg: No edema.     Left lower leg: No edema.  Lymphadenopathy:     Cervical: No cervical adenopathy.  Skin:    General: Skin is warm and dry.  Neurological:     General: No focal deficit present.     Mental Status: She is alert and oriented to person, place, and time.  Psychiatric:        Mood and Affect: Mood normal.        Behavior: Behavior normal.      LABORATORY DATA:  I have reviewed the labs as listed.     Latest Ref Rng & Units 04/16/2022    2:27 PM 08/20/2021    2:47 PM 04/18/2021    1:08 PM  CBC  WBC 4.0 - 10.5 K/uL 3.6  4.3  4.3   Hemoglobin 12.0 - 15.0 g/dL 13.3  12.5  12.6   Hematocrit 36.0 - 46.0 % 41.5  40.1  39.8   Platelets 150 - 400 K/uL 195  292  181       Latest Ref Rng & Units 04/16/2022    2:27 PM 08/20/2021    2:47 PM 04/18/2021    1:08 PM  CMP  Glucose 70 - 99 mg/dL 87  83  89   BUN 8 - 23 mg/dL _0 Creatinine 0.44 - 1.00 mg/dL 0.73  0.70  0.89   Sodium 135 - 145 mmol/L 138  141  140   Potassium 3.5 - 5.1 mmol/L 4.4  3.6  4.2   Chloride 98 - 111 mmol/L 104  103  106   CO2 22 - 32 mmol/L _1 Calcium 8.9 - 10.3 mg/dL 9.0  9.7  9.2   Total Protein 6.5 - 8.1 g/dL 7.8  8.3  7.9   Total Bilirubin 0.3 - 1.2 mg/dL 0.6  0.3  0.6   Alkaline Phos 38 - 126 U/L 98  100  106   AST 15 - 41 U/L 21  32  18   ALT 0 - 44 U/L 5  6  <5     DIAGNOSTIC IMAGING:  I have independently  reviewed the scans and discussed with the patient. No results found.   ASSESSMENT & PLAN: 1.  Stage I (PT1CPNX) right breast upper outer quadrant IDC, ER/PR positive, HER2 negative  - Screening mammogram on 08/07/2020 BI-RADS Category 0. - Right breast mammogram and ultrasound on 09/01/2020 shows hypoechoic mass with irregular shape and borders at the 10 o'clock position of the right breast, 7 cm from the nipple measuring 0.8 x 1 x 1.3 cm.  Ultrasound of the right axilla was normal. - Biopsy of the right breast 10 o'clock position consistent with IDC, ER 95% positive, PR 90% positive, Ki-67 5%, HER2 2+ by IHC, negative by FISH. - Right lumpectomy on 11/03/2020 with 1.6  cm invasive ductal carcinoma, grade 1.  Resection margins negative.  Closest margin is 1 mm, superior margin.  PT1CPNX. - Letrozole started on 12/05/2020. - She is tolerating letrozole reasonably well.  No major hot flashes or musculoskeletal symptoms. - Examination shows right breast upper outer quadrant lumpectomy scar is within normal limits with no palpable masses.   - She reports a lump in the upper mid back.  There is a subcutaneous mass with slip sign consistent with lipoma. - Labs reviewed by me (04/16/2022) shows normal LFTs and CBC. - Patient history/ROS negative for any "red flag" symptoms of recurrence - Most recent mammogram (09/25/2021) BI-RADS Category 2, benign.  New right breast lumpectomy site noted, but no mammographic evidence of malignancy in either breast. - PLAN: Labs and diagnostic mammogram due in about 6 months (April 2024) - Office visit in 6 months, after labs/mammogram  2.  Stage I left breast cancer: - Diagnosed 01/07/2012, ER/PR positive, status post lumpectomy and SLNB. - XRT completed in November 2013. - Took 5 years of anastrozole.  3.  Osteopenia - DEXA scan on 11/20/2020 with T score -1.6, osteopenia. - She is taking vitamin D 2 tablets daily  - Vitamin D level improved at 33.47 (04/16/2022) -  PLAN: She will be due for repeat bone scan in June 2024.  We will set this up after her next visit. - Continue calcium, vitamin D, and weightbearing exercises for breast cancer therapy associated bone loss.  4.  Other history -PMH also significant for Parkinson's disease and hypertension - She lives with husband at home and is independent of ADLs.  Uses a wheelchair for mobility but does sometimes walk with the help of a walker.  Denies any falls. - Quit smoking in 2000.  Smoked 3 to 4 cigarettes/day for many years. - Brother had pancreatic cancer.  Mother had colon cancer.  No family history of breast cancers.   PLAN SUMMARY & DISPOSITION: Labs in 24 weeks (CBC/D, CMP, vitamin D) Diagnostic mammogram in 24 weeks Office visit 1 week after labs/mammogram  All questions were answered. The patient knows to call the clinic with any problems, questions or concerns.  Medical decision making: Moderate  Time spent on visit: I spent 20 minutes counseling the patient face to face. The total time spent in the appointment was 30 minutes and more than 50% was on counseling.   Harriett Rush, PA-C  04/23/2022 3:06 PM

## 2022-04-23 ENCOUNTER — Inpatient Hospital Stay: Payer: BC Managed Care – PPO | Attending: Hematology | Admitting: Physician Assistant

## 2022-04-23 ENCOUNTER — Other Ambulatory Visit: Payer: Self-pay | Admitting: Physician Assistant

## 2022-04-23 VITALS — BP 144/71 | HR 67 | Temp 97.7°F | Resp 17

## 2022-04-23 DIAGNOSIS — C50411 Malignant neoplasm of upper-outer quadrant of right female breast: Secondary | ICD-10-CM

## 2022-04-23 DIAGNOSIS — R232 Flushing: Secondary | ICD-10-CM | POA: Diagnosis not present

## 2022-04-23 DIAGNOSIS — Z7982 Long term (current) use of aspirin: Secondary | ICD-10-CM | POA: Diagnosis not present

## 2022-04-23 DIAGNOSIS — Z800 Family history of malignant neoplasm of digestive organs: Secondary | ICD-10-CM | POA: Insufficient documentation

## 2022-04-23 DIAGNOSIS — E559 Vitamin D deficiency, unspecified: Secondary | ICD-10-CM

## 2022-04-23 DIAGNOSIS — G20A1 Parkinson's disease without dyskinesia, without mention of fluctuations: Secondary | ICD-10-CM | POA: Insufficient documentation

## 2022-04-23 DIAGNOSIS — Z923 Personal history of irradiation: Secondary | ICD-10-CM | POA: Insufficient documentation

## 2022-04-23 DIAGNOSIS — R5383 Other fatigue: Secondary | ICD-10-CM | POA: Diagnosis not present

## 2022-04-23 DIAGNOSIS — Z170 Estrogen receptor positive status [ER+]: Secondary | ICD-10-CM

## 2022-04-23 DIAGNOSIS — I10 Essential (primary) hypertension: Secondary | ICD-10-CM | POA: Diagnosis not present

## 2022-04-23 DIAGNOSIS — Z853 Personal history of malignant neoplasm of breast: Secondary | ICD-10-CM | POA: Diagnosis not present

## 2022-04-23 DIAGNOSIS — Z79811 Long term (current) use of aromatase inhibitors: Secondary | ICD-10-CM | POA: Diagnosis not present

## 2022-04-23 DIAGNOSIS — Z87891 Personal history of nicotine dependence: Secondary | ICD-10-CM | POA: Insufficient documentation

## 2022-04-23 DIAGNOSIS — Z79899 Other long term (current) drug therapy: Secondary | ICD-10-CM | POA: Insufficient documentation

## 2022-04-23 DIAGNOSIS — M8580 Other specified disorders of bone density and structure, unspecified site: Secondary | ICD-10-CM | POA: Insufficient documentation

## 2022-04-23 NOTE — Patient Instructions (Signed)
[place] [facility] [place] **   You were seen today by Tarri Abernethy PA-C for your history of breast cancer.    HISTORY OF BREAST CANCER Your labs and physical exam today did not show any signs of recurrent cancer. We will check your mammogram and repeat labs in April 2024.    MEDICATIONS: - Continue letrozole (Femara), which is her breast cancer pill, once daily - Continue vitamin D and calcium supplements  OTHER TESTS: Mammogram in April 2024  FOLLOW-UP APPOINTMENT: Office visit in about 6 months, 1 week after labs/mammogram  ** Thank you for trusting me with your healthcare!  I strive to provide all of my patients with quality care at each visit.  If you receive a survey for this visit, I would be so grateful to you for taking the time to provide feedback.  Thank you in advance!  ~ Semir Brill                   Dr. Derek Jack   &   Tarri Abernethy, PA-C   - - - - - - - - - - - - - - - - - -    Thank you for choosing [place] at [facility] to provide your oncology and hematology care.  To afford each patient quality time with our provider, please arrive at least 15 minutes before your scheduled appointment time.   If you have a lab appointment with the [facility] [place] please come in thru the Main Entrance and check in at the main information desk.  You need to re-schedule your appointment should you arrive 10 or more minutes late.  We strive to give you quality time with our providers, and arriving late affects you and other patients whose appointments are after yours.  Also, if you no show three or more times for appointments you may be dismissed from the clinic at the providers discretion.     Again, thank you for choosing [facility].  Our hope is that these requests will decrease the amount of time that you wait before being seen by our physicians.        _____________________________________________________________  Should you have questions after your visit to [facility], please contact our office at [phone] and follow the prompts.  Our office hours are 8:00 a.m. and 4:30 p.m. Monday - Friday.  Please note that voicemails left after 4:00 p.m. may not be returned until the following business day.  We are closed weekends and major holidays.  You do have access to a nurse 24-7, just call the main number to the clinic [phone] and do not press any options, hold on the line and a nurse will answer the phone.    For prescription refill requests, have your pharmacy contact our office and allow 72 hours.

## 2022-05-08 DIAGNOSIS — M25562 Pain in left knee: Secondary | ICD-10-CM | POA: Diagnosis not present

## 2022-05-08 DIAGNOSIS — M1712 Unilateral primary osteoarthritis, left knee: Secondary | ICD-10-CM | POA: Diagnosis not present

## 2022-05-08 DIAGNOSIS — M25561 Pain in right knee: Secondary | ICD-10-CM | POA: Diagnosis not present

## 2022-05-08 DIAGNOSIS — Z96651 Presence of right artificial knee joint: Secondary | ICD-10-CM | POA: Diagnosis not present

## 2022-05-20 DIAGNOSIS — I10 Essential (primary) hypertension: Secondary | ICD-10-CM | POA: Diagnosis not present

## 2022-05-20 DIAGNOSIS — G20A2 Parkinson's disease without dyskinesia, with fluctuations: Secondary | ICD-10-CM | POA: Diagnosis not present

## 2022-05-20 DIAGNOSIS — M130 Polyarthritis, unspecified: Secondary | ICD-10-CM | POA: Diagnosis not present

## 2022-05-20 DIAGNOSIS — R2689 Other abnormalities of gait and mobility: Secondary | ICD-10-CM | POA: Diagnosis not present

## 2022-05-24 ENCOUNTER — Other Ambulatory Visit (HOSPITAL_COMMUNITY): Payer: Self-pay | Admitting: Hematology

## 2022-05-28 ENCOUNTER — Other Ambulatory Visit: Payer: Self-pay | Admitting: *Deleted

## 2022-05-28 DIAGNOSIS — B351 Tinea unguium: Secondary | ICD-10-CM | POA: Diagnosis not present

## 2022-05-28 NOTE — Telephone Encounter (Signed)
Letrozole refill approved.  Patient tolerating and is to continue therapy. 

## 2022-07-22 ENCOUNTER — Encounter: Payer: Self-pay | Admitting: Neurology

## 2022-07-22 ENCOUNTER — Ambulatory Visit (INDEPENDENT_AMBULATORY_CARE_PROVIDER_SITE_OTHER): Payer: No Typology Code available for payment source | Admitting: Neurology

## 2022-07-22 VITALS — BP 159/78 | HR 70

## 2022-07-22 DIAGNOSIS — Z96651 Presence of right artificial knee joint: Secondary | ICD-10-CM | POA: Diagnosis not present

## 2022-07-22 DIAGNOSIS — M24561 Contracture, right knee: Secondary | ICD-10-CM | POA: Diagnosis not present

## 2022-07-22 DIAGNOSIS — R269 Unspecified abnormalities of gait and mobility: Secondary | ICD-10-CM

## 2022-07-22 DIAGNOSIS — M24562 Contracture, left knee: Secondary | ICD-10-CM | POA: Diagnosis not present

## 2022-07-22 DIAGNOSIS — M79671 Pain in right foot: Secondary | ICD-10-CM

## 2022-07-22 MED ORDER — GABAPENTIN 300 MG PO CAPS: 300.0000 mg | ORAL_CAPSULE | Freq: Three times a day (TID) | ORAL | 11 refills | Status: DC

## 2022-07-22 NOTE — Progress Notes (Signed)
Chief Complaint  Patient presents with   New Patient (Initial Visit)    Rm 15 with son. Pt reports she would like to discuss worsening right foot pain ( more noticeable at night). Right tremors in the hand and leg are noticeable almost daily.       ASSESSMENT AND PLAN  Lisa Torres is a 77 y.o. female   Parkinsonism Fixed contraction of bilateral knee, history of failed right knee replacement, and right knee revision, wheelchair-bound since 2019 Fixed contraction of right ankle plantarflexion,   Her gait abnormality, mobility multifactorial, major limitation of fixed contraction of bilateral knee,  X-ray of bilateral knees to see if there is any calcification, if not, may consider aggressive stretching, rehabilitation,   Upper motor neuron signs on right side, MRI of the brain to rule out left hemisphere pathology  EMG nerve conduction study for evaluation of her right leg pain, paresthesia, gabapentin 100 mg 3 times a day  DIAGNOSTIC DATA (LABS, IMAGING, TESTING) - I reviewed patient records, labs, notes, testing and imaging myself where available.   MEDICAL HISTORY:  Lisa Torres, is a 77 year old female, seen in request by her primary care physician from [facility] Dr. Consuello Masse for evaluation of gait abnormality, left foot pain, she is accompanied by her son at today's visit July 22, 2022  I reviewed and summarized the referring note. PMHX HTN Hypothyroidism History of breast cancer, s/p double mastectomy, s/p radiation therapy. Right knee replacement in 2018, revision in 2019,  I saw her previously in 2016, at that time she was seen for right hand tremor started around 2014, right shoulder achiness, difficulty initiate steps, gait change, her exam are consistent with Parkinson's disease, she was treated with Mirapex, reported did help her symptoms, at that time MRI of the brain, laboratory evaluation showed no significant abnormalities  Later evaluation  also demonstrated MRI of lumbar, thoracic spine degenerative changes but no significant canal foraminal stenosis, examination in 2018 document stiff cautious gait with small stride,  She underwent right knee replacement first in 2018, then revision by Dr. Lara Mulch, for failed right total knee replacement  Since then, she has been wheelchair-bound, she used to live alone, lost her husband in February 2023, her son just moved in with her a week ago,  She was seen by Dr. Lily Lovings for few years, now on Sinemet 25/100 mg 2 tablets 3 times a day, was not sure about the benefit, because she is no longer ambulatory, walking her wheelchair, need assistant to transfer, denies bowel bladder incontinence  Over the years, she has developed fixed contraction of bilateral knee, significant right knee pain, also right ankle fixed plantarflexion, right lower extremity pain paresthesia below knee level,   PHYSICAL EXAM:   Vitals:   07/22/22 1531  BP: (!) 159/78  Pulse: 70   There is no height or weight on file to calculate BMI.  PHYSICAL EXAMNIATION:  Gen: NAD, conversant, well nourised, well groomed                     Cardiovascular: Regular rate rhythm, no peripheral edema, warm, nontender. Eyes: Conjunctivae clear without exudates or hemorrhage Neck: Supple, no carotid bruits. Pulmonary: Clear to auscultation bilaterally   NEUROLOGICAL EXAM:  MENTAL STATUS: Speech/cognition: Awake, alert, oriented to history taking and casual conversation CRANIAL NERVES: CN II: Visual fields are full to confrontation. Pupils are round equal and briskly reactive to light. CN III, IV, VI: extraocular movement are normal. No  ptosis. CN V: Facial sensation is intact to light touch CN VII: Face is symmetric with normal eye closure  CN VIII: Hearing is normal to causal conversation. CN IX, X: Phonation is normal. CN XI: Head turning and shoulder shrug are intact  MOTOR: Bilateral upper extremity motor  strength is normal, right more than left mild to moderate rigidity and bradykinesia,  She has fixed contraction of bilateral knee, maximum range of motion of left knee is 150 degree, no significant muscle weakness  Fixed contraction of right knee maximum is extension 110 degree, right ankle fixed in plantarflexion position, there was no significant muscle weakness,  REFLEXES: Reflexes are 2+ and symmetric at the biceps, triceps, absent at knees, and ankles. Plantar responses are extensor on the right, flaccid on left,  SENSORY: Intact to light touch, pinprick and vibratory sensation are intact in fingers and toes.  COORDINATION: There is no trunk or limb dysmetria noted.  GAIT/STANCE: Need assistant to get up from seated position, could not straight up her knee difficulty initiate gait  REVIEW OF SYSTEMS:  Full 14 system review of systems performed and notable only for as above All other review of systems were negative.   ALLERGIES: Allergies  Allergen Reactions   Hydrocodone Other (See Comments)    Heart pain  Delusions   Tramadol Other (See Comments)    Hallucinations, abd pain Delusions (intolerance)    HOME MEDICATIONS: Current Outpatient Medications  Medication Sig Dispense Refill   amLODipine (NORVASC) 5 MG tablet Take 5 mg by mouth daily.      aspirin 325 MG EC tablet Take 1 tablet (325 mg total) by mouth 2 (two) times daily. 30 tablet 0   B Complex Vitamins (B COMPLEX PO) Place 1 tablet under the tongue daily.     baclofen (LIORESAL) 10 MG tablet Take 10 mg by mouth at bedtime.      calcium-vitamin D (OSCAL WITH D) 500-200 MG-UNIT tablet Take 1 tablet by mouth daily with breakfast. 90 tablet 1   carbidopa-levodopa (SINEMET IR) 25-100 MG tablet Take 2 tablets by mouth 3 (three) times daily.     Cyanocobalamin (VITAMIN B 12 PO) Take by mouth.     diazepam (VALIUM) 5 MG tablet Take 5 mg by mouth 2 (two) times daily.     ergocalciferol (VITAMIN D2) 1.25 MG (50000 UT)  capsule Take by mouth.     levothyroxine (SYNTHROID, LEVOTHROID) 100 MCG tablet Take 100 mcg by mouth daily before breakfast.     MELATONIN PO Take by mouth.     pantoprazole (PROTONIX) 40 MG tablet Take 1 tablet (40 mg total) by mouth daily before breakfast. 90 tablet 3   traZODone (DESYREL) 50 MG tablet Take 100 mg by mouth at bedtime.      No current facility-administered medications for this visit.    PAST MEDICAL HISTORY: Past Medical History:  Diagnosis Date   Breast cancer ([place]) 11/2011   Chronic back pain    Chronic leg pain    bilateral   Chronic leukopenia    DDD (degenerative disc disease), cervical    DDD (degenerative disc disease), lumbar    Gait abnormality    GERD (gastroesophageal reflux disease)    Hypertension    Hypothyroidism    Parkinson's disease    Radiation 04/2012   33 treatments for breast cancer   Right knee pain    Thyroid disease     PAST SURGICAL HISTORY: Past Surgical History:  Procedure Laterality Date   ABDOMINAL  HYSTERECTOMY     partial   BREAST LUMPECTOMY Left 12/2011   CHOLECYSTECTOMY     COLONOSCOPY N/A 07/01/2013   Procedure: COLONOSCOPY;  Surgeon: Rogene Houston, MD;  [facility];  Service: Endoscopy;  Laterality: N/A;  1030   COLONOSCOPY N/A 09/15/2019   Procedure: COLONOSCOPY;  Surgeon: Rogene Houston, MD;  [facility];  Service: Endoscopy;  Laterality: N/A;  815   ESOPHAGOGASTRODUODENOSCOPY N/A 11/21/2017   Procedure: ESOPHAGOGASTRODUODENOSCOPY (EGD);  Surgeon: Rogene Houston, MD;  [facility];  Service: Endoscopy;  Laterality: N/A;  2:45   EXCISIONAL TOTAL KNEE ARTHROPLASTY WITH ANTIBIOTIC SPACERS Right 01/12/2018   Procedure: EXCISIONAL TOTAL KNEE ARTHROPLASTY WITH ANTIBIOTIC SPACERS;  Surgeon: Vickey Huger, MD;  Location: [facility];  Service: Orthopedics;  Laterality: Right;   JOINT REPLACEMENT  01/2017   right knee   KNEE SURGERY Right 01/12/2018   EXCISIONAL TOTAL KNEE ARTHROPLASTY WITH  ANTIBIOTIC SPACERS   MASTECTOMY, PARTIAL Right 11/03/2020   Procedure: RIGHT PARTIAL MASTECTOMY AFTER TAG PLACEMENT;  Surgeon: Aviva Signs, MD;  Location: [facility];  Service: General;  Laterality: Right;   POLYPECTOMY  09/15/2019   Procedure: POLYPECTOMY;  Surgeon: Rogene Houston, MD;  [facility];  Service: Endoscopy;;  Cold Snare transverse colon polyp   TOTAL KNEE REVISION Right 03/23/2018   Procedure: RIGHT TOTAL KNEE REVISION;  Surgeon: Vickey Huger, MD;  Location: [facility];  Service: Orthopedics;  Laterality: Right;   TUBAL LIGATION      FAMILY HISTORY: Family History  Problem Relation Age of Onset   Cancer Mother 67       colon cancer   Cancer Father 22       throat cancer   Pancreatic cancer Brother        63y 11-14-15    SOCIAL HISTORY: Social History   Socioeconomic History   Marital status: Widowed    Spouse name: Marcello Moores   Number of children: 3   Years of education: 12 th   Highest education level: Not on file  Occupational History    Comment: retired  Tobacco Use   Smoking status: Former    Packs/day: 0.50    Years: 18.00    Total pack years: 9.00    Types: Cigarettes    Quit date: 09/30/1998    Years since quitting: 23.8   Smokeless tobacco: Never  Vaping Use   Vaping Use: Never used  Substance and Sexual Activity   Alcohol use: No   Drug use: No   Sexual activity: Not on file  Other Topics Concern   Not on file  Social History Narrative   Patient lives at home with her husband Marcello Moores).    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   Right handed   Social Determinants of Health   Financial Resource Strain: Low Risk  (10/05/2020)   Overall Financial Resource Strain (CARDIA)    Difficulty of Paying Living Expenses: Not very hard  Food Insecurity: No Food Insecurity (10/05/2020)   Hunger Vital Sign    Worried About Running Out of Food in the Last Year: Never true    Ran Out of Food in the Last Year: Never true  Transportation Needs: No  Transportation Needs (10/05/2020)   PRAPARE - Hydrologist (Medical): No    Lack of Transportation (Non-Medical): No  Physical Activity: Inactive (10/05/2020)   Exercise Vital Sign    Days of Exercise per Week: 0 days  Minutes of Exercise per Session: 0 min  Stress: No Stress Concern Present (10/05/2020)   [place]    Feeling of Stress : Only a little  Social Connections: Socially Integrated (10/05/2020)   Social Connection and Isolation Panel [NHANES]    Frequency of Communication with Friends and Family: More than three times a week    Frequency of Social Gatherings with Friends and Family: More than three times a week    Attends Religious Services: More than 4 times per year    Active Member of [employer] or Organizations: No    Attends Music therapist: More than 4 times per year    Marital Status: Married  Human resources officer Violence: Not At Risk (10/05/2020)   Humiliation, Afraid, Rape, and Kick questionnaire    Fear of Current or Ex-Partner: No    Emotionally Abused: No    Physically Abused: No    Sexually Abused: No      Marcial Pacas, M.D. Ph.D.  [facility] [address], [place], [place] [address] Ph: [phone] Fax: [phone]  CC:  Phillips Odor, MD [place],  [place] [id number]  Quintin Alto, Silvestre Moment, MD

## 2022-07-22 NOTE — Patient Instructions (Signed)
[facility]    Address: [address], [place], [place] [address]  Phone: [phone]   

## 2022-07-30 ENCOUNTER — Telehealth: Payer: Self-pay | Admitting: Neurology

## 2022-07-30 NOTE — Telephone Encounter (Signed)
[id number] [place] 07/30/22-09/28/22, Dillon Bjork: [id number] exp. 07/30/22-08/28/22 sent to [facility] [phone]

## 2022-09-03 ENCOUNTER — Ambulatory Visit: Payer: No Typology Code available for payment source

## 2022-09-03 ENCOUNTER — Ambulatory Visit (INDEPENDENT_AMBULATORY_CARE_PROVIDER_SITE_OTHER): Payer: No Typology Code available for payment source | Admitting: Podiatry

## 2022-09-03 DIAGNOSIS — M79671 Pain in right foot: Secondary | ICD-10-CM

## 2022-09-03 DIAGNOSIS — M21541 Acquired clubfoot, right foot: Secondary | ICD-10-CM

## 2022-09-03 DIAGNOSIS — M21371 Foot drop, right foot: Secondary | ICD-10-CM

## 2022-09-08 NOTE — Progress Notes (Signed)
  Subjective:  Patient ID: Lisa Torres, female    DOB: 12/29/1945,  MRN: [id number]  Chief Complaint  Patient presents with   Ankle Pain    right ankle & foot - starting to twist / feels like needles or pins are sticking in foot, patient had a knee replacement in 2018, rate of pain 8 out of 10, X-rays done today     77 y.o. female presents with the above complaint. History confirmed with patient.  The patient presents today for evaluation of a right lower extremity disability and deformity.  Most of her issues develop following initial knee arthroplasty in 2018, this was followed by postoperative infection and multiple revisions.  She has not been able to walk following this.  Following her most recent revision, she developed contracture of the foot and the foot is turned in an inward position she feels pins-and-needles in her feet.  Her family states that they "just want her to be right" and would like her to be able to walk again.  They have had multiple opinions on her knee condition and does not appear to be any possibility of revising this at this point.  They stated they would like to to avoid major surgery.  They were referred for an AFO brace at one point but they said that this did not help.  Objective:  Physical Exam: warm, good capillary refill, no trophic changes or ulcerative lesions, normal DP and PT pulses, and she has an abnormal sensory exam with loss of protective sensation, she has absent dorsiflexion and eversion strength plantarflexion is intact, she is in a contracted equinovarus position..   Radiographs: Multiple views x-ray of the right foot: Films taken today (ankle films unable to be taken due to positioning) show equinovarus contracture of the foot, scattered degenerative changes in midfoot Assessment:   1. Right foot pain   2. Foot drop, right   3. Acquired equinovarus deformity of right foot      Plan:  Patient was evaluated and treated and all questions  answered.  Unfortunate as patient has a very complicated deformity and complicated history including Parkinson's disease.  She takes Sinemet for this if she does not take that she has significant tremors.  She has had multiple revisions of her right knee arthroplasty has been complicated by infections and she is in a contracted flexion position due to this.  I reviewed notes and recommendations from her neurologist Dr. Krista Blue, most recent orthopedic evaluations by Dr. Doran Durand and Dr. Alvan Dame and Dr. Murvin Natal.  Dr. Alvan Dame does not feel there is any reasonable way to salvage her knee at this point, Dr. Doran Durand feels that due to the contracture of the knee that major reconstructive surgery of the foot and ankle is not likely to offer much functional gain.  The EMG and NCV completed on 03/07/2022 shows absent peroneal and sural motor nerve response.  I am inclined to agree with the recommendations that reconstruction with PT tendon transfer or TTC fusion to realign the foot and ankle is not likely to offer much in the way of a functional gain considering the extensive contracture of her knee that is not salvageable at this point.  The family says they had an AFO at 1 point but this was not helpful and she could not wear it.  I will see her back as needed for further recommendations.

## 2022-09-10 ENCOUNTER — Ambulatory Visit (INDEPENDENT_AMBULATORY_CARE_PROVIDER_SITE_OTHER): Payer: No Typology Code available for payment source | Admitting: Podiatry

## 2022-09-10 DIAGNOSIS — B351 Tinea unguium: Secondary | ICD-10-CM | POA: Diagnosis not present

## 2022-09-10 DIAGNOSIS — M76821 Posterior tibial tendinitis, right leg: Secondary | ICD-10-CM

## 2022-09-10 DIAGNOSIS — M79674 Pain in right toe(s): Secondary | ICD-10-CM | POA: Diagnosis not present

## 2022-09-10 DIAGNOSIS — M79675 Pain in left toe(s): Secondary | ICD-10-CM

## 2022-09-10 MED ORDER — BETAMETHASONE SOD PHOS & ACET 6 (3-3) MG/ML IJ SUSP
3.0000 mg | Freq: Once | INTRAMUSCULAR | Status: AC
  Administered 2022-09-10: 3 mg via INTRA_ARTICULAR

## 2022-09-10 NOTE — Progress Notes (Signed)
   Chief Complaint  Patient presents with   routine foot care     SUBJECTIVE Patient presents to office today complaining of elongated, thickened nails that cause pain while ambulating in shoes.  Patient is unable to trim their own nails.   Patient also experiences sharp shooting sensations along the medial aspect of the right foot.  She would like to have it evaluated today.  Patient is here for further evaluation and treatment.  Past Medical History:  Diagnosis Date   Breast cancer ([place]) 11/2011   Chronic back pain    Chronic leg pain    bilateral   Chronic leukopenia    DDD (degenerative disc disease), cervical    DDD (degenerative disc disease), lumbar    Gait abnormality    GERD (gastroesophageal reflux disease)    Hypertension    Hypothyroidism    Parkinson's disease    Radiation 04/2012   33 treatments for breast cancer   Right knee pain    Thyroid disease     Allergies  Allergen Reactions   Hydrocodone Other (See Comments)    Heart pain  Delusions   Tramadol Other (See Comments)    Hallucinations, abd pain Delusions (intolerance)     OBJECTIVE General Patient is awake, alert, and oriented x 3 and in no acute distress. Derm Skin is dry and supple bilateral. Negative open lesions or macerations. Remaining integument unremarkable. Nails are tender, long, thickened and dystrophic with subungual debris, consistent with onychomycosis, 1-5 bilateral. No signs of infection noted. Vasc  DP and PT pedal pulses palpable bilaterally. Temperature gradient within normal limits.  Neuro Epicritic and protective threshold sensation grossly intact bilaterally.  Musculoskeletal Exam No symptomatic pedal deformities noted bilateral. Muscular strength within normal limits.  There is also some tenderness to palpation along posterior tibial tendon of the right lower extremity  ASSESSMENT 1.  Pain due to onychomycosis of toenails both 2.  Posterior tibial tendinitis right  PLAN OF  CARE 1. Patient evaluated today.  2. Instructed to maintain good pedal hygiene and foot care.  3. Mechanical debridement of nails 1-5 bilaterally performed using a nail nipper. Filed with dremel without incident.  4.  Injection of 0.5 cc Celestone Soluspan injected along the posterior tibial tendon sheath right  5.  Return to clinic in 3 mos. for routine footcare   Edrick Kins, DPM [facility]  Dr. Edrick Kins, DPM    [address], [place] [address]                Office [phone]  Fax [phone]

## 2022-09-25 DIAGNOSIS — E039 Hypothyroidism, unspecified: Secondary | ICD-10-CM | POA: Diagnosis not present

## 2022-09-25 DIAGNOSIS — E876 Hypokalemia: Secondary | ICD-10-CM | POA: Diagnosis not present

## 2022-09-25 DIAGNOSIS — R5383 Other fatigue: Secondary | ICD-10-CM | POA: Diagnosis not present

## 2022-09-25 DIAGNOSIS — E7849 Other hyperlipidemia: Secondary | ICD-10-CM | POA: Diagnosis not present

## 2022-09-27 ENCOUNTER — Other Ambulatory Visit: Payer: Self-pay | Admitting: Hematology

## 2022-09-27 NOTE — Telephone Encounter (Signed)
Per last office note, okay to refill letrozole.  Patient tolerating it well.

## 2022-10-01 ENCOUNTER — Ambulatory Visit (HOSPITAL_COMMUNITY)
Admission: RE | Admit: 2022-10-01 | Discharge: 2022-10-01 | Disposition: A | Discharge status: Home / Self Care | Payer: No Typology Code available for payment source | Source: Ambulatory Visit | Attending: Physician Assistant | Admitting: Physician Assistant

## 2022-10-01 ENCOUNTER — Inpatient Hospital Stay: Payer: No Typology Code available for payment source | Attending: Hematology

## 2022-10-01 ENCOUNTER — Other Ambulatory Visit (HOSPITAL_COMMUNITY): Payer: BC Managed Care – PPO

## 2022-10-01 DIAGNOSIS — M8580 Other specified disorders of bone density and structure, unspecified site: Secondary | ICD-10-CM | POA: Diagnosis not present

## 2022-10-01 DIAGNOSIS — Z170 Estrogen receptor positive status [ER+]: Secondary | ICD-10-CM | POA: Insufficient documentation

## 2022-10-01 DIAGNOSIS — C50411 Malignant neoplasm of upper-outer quadrant of right female breast: Secondary | ICD-10-CM

## 2022-10-01 DIAGNOSIS — Z79899 Other long term (current) drug therapy: Secondary | ICD-10-CM | POA: Diagnosis not present

## 2022-10-01 DIAGNOSIS — E559 Vitamin D deficiency, unspecified: Secondary | ICD-10-CM | POA: Diagnosis not present

## 2022-10-01 DIAGNOSIS — R92323 Mammographic fibroglandular density, bilateral breasts: Secondary | ICD-10-CM | POA: Diagnosis not present

## 2022-10-01 LAB — COMPREHENSIVE METABOLIC PANEL
ALT: 13 U/L (ref 0–44)
AST: 22 U/L (ref 15–41)
Albumin: 4.6 g/dL (ref 3.5–5.0)
Alkaline Phosphatase: 104 U/L (ref 38–126)
Anion gap: 10 (ref 5–15)
BUN: 12 mg/dL (ref 8–23)
CO2: 25 mmol/L (ref 22–32)
Calcium: 9.5 mg/dL (ref 8.9–10.3)
Chloride: 103 mmol/L (ref 98–111)
Creatinine, Ser: 0.7 mg/dL (ref 0.44–1.00)
GFR, Estimated: >60 mL/min (ref >60)
Glucose, Bld: 77 mg/dL (ref 70–99)
Potassium: 3.9 mmol/L (ref 3.5–5.1)
Sodium: 138 mmol/L (ref 135–145)
Total Bilirubin: 0.6 mg/dL (ref 0.3–1.2)
Total Protein: 8.9 g/dL — ABNORMAL HIGH (ref 6.5–8.1)

## 2022-10-01 LAB — CBC WITH DIFFERENTIAL/PLATELET
Abs Immature Granulocytes: 0.01 10*3/uL (ref 0.00–0.07)
Basophils Absolute: 0 10*3/uL (ref 0.0–0.1)
Basophils Relative: 1 %
Eosinophils Absolute: 0.2 10*3/uL (ref 0.0–0.5)
Eosinophils Relative: 4 %
HCT: 45.8 % (ref 36.0–46.0)
Hemoglobin: 14.5 g/dL (ref 12.0–15.0)
Immature Granulocytes: 0 %
Lymphocytes Relative: 30 %
Lymphs Abs: 1.2 10*3/uL (ref 0.7–4.0)
MCH: 28.9 pg (ref 26.0–34.0)
MCHC: 31.7 g/dL (ref 30.0–36.0)
MCV: 91.2 fL (ref 80.0–100.0)
Monocytes Absolute: 0.2 10*3/uL (ref 0.1–1.0)
Monocytes Relative: 6 %
Neutro Abs: 2.4 10*3/uL (ref 1.7–7.7)
Neutrophils Relative %: 59 %
Platelets: 149 10*3/uL — ABNORMAL LOW (ref 150–400)
RBC: 5.02 MIL/uL (ref 3.87–5.11)
RDW: 12.6 % (ref 11.5–15.5)
WBC: 4.1 10*3/uL (ref 4.0–10.5)
nRBC: 0 % (ref 0.0–0.2)

## 2022-10-01 LAB — VITAMIN D 25 HYDROXY (VIT D DEFICIENCY, FRACTURES): Vit D, 25-Hydroxy: 39.69 ng/mL (ref 30–100)

## 2022-10-08 ENCOUNTER — Ambulatory Visit: Payer: BC Managed Care – PPO | Admitting: Physician Assistant

## 2022-10-08 NOTE — Telephone Encounter (Signed)
Patient called asking to schedule MRI. [facility] Harvey: [id number] exp. 10/08/22-12/08/22 [place] Numbers: [id number] exp. 10/08/22-11/06/22 sent to [facility] [phone]

## 2022-10-18 NOTE — Progress Notes (Unsigned)
[facility] [address], [place] [address]   CLINIC:  Medical Oncology/Hematology  PCP:  Lisa Pandy, MD [address] Lisa Torres [place] [address] [phone]   REASON FOR VISIT:  Follow-up for right breast cancer (2022) and left breast cancer (2013)   CURRENT THERAPY: Anastrozole   BRIEF ONCOLOGIC HISTORY:  Stage I right breast cancer: Diagnosed February 2022.  Right lumpectomy 11/03/2020.  Anastrozole started 12/05/2020. Stage I left breast cancer: Diagnosed July 2013, s/p lumpectomy and SLNB.  XRT completed November 2013.  Completed 5 years anastrozole.  CANCER STAGING: Cancer Staging  Breast cancer of upper-outer quadrant of right female breast [facility]) Staging form: Breast, AJCC 8th Edition - Clinical stage from 10/09/2020: cT1c, cN0, cM0, G1, ER+, HER2- - Unsigned   INTERVAL HISTORY:   Lisa Torres, a 77 y.o. female, returns for routine follow-up of her history of bilateral breast cancer (left breast 2013, right breast 2022). Lisa Torres was last seen on 04/23/2022 by Lisa Brenner PA-C.   At today's visit, she  reports feeling fair.  She denies any recent hospitalizations, surgeries, or changes in her  baseline health status.  She denies any symptoms of recurrence such as new lumps, bone pain, chest pain, or dyspnea.  She does have some intermittent abdominal pain associated with constipation.   She has no new headaches, seizures, or focal neurologic deficits.  No B symptoms such as fever, chills, unintentional weight loss.  She continues to take letrozole daily, with hot flashes and night sweats once or twice a week.   She is taking calcium and vitamin D supplements daily.  She reports 40% energy and 100% appetite.  She is maintaining stable weight at this time.   ASSESSMENT & PLAN:  1.  Stage I (PT1CPNX) right breast upper outer quadrant IDC, ER/PR positive, HER2 negative  - Screening mammogram on 08/07/2020 BI-RADS Category 0. -  Right breast mammogram and ultrasound on 09/01/2020 shows hypoechoic mass with irregular shape and borders at the 10 o'clock position of the right breast, 7 cm from the nipple measuring 0.8 x 1 x 1.3 cm.  Ultrasound of the right axilla was normal. - Biopsy of the right breast 10 o'clock position consistent with IDC, ER 95% positive, PR 90% positive, Ki-67 5%, HER2 2+ by IHC, negative by FISH. - Right lumpectomy on 11/03/2020 with 1.6 cm invasive ductal carcinoma, grade 1.  Resection margins negative.  Closest margin is 1 mm, superior margin.  PT1CPNX. - Letrozole started on 12/05/2020. - She is tolerating letrozole reasonably well with hot flashes and night sweats 1-2 times each week - Examination shows right breast upper outer quadrant lumpectomy scar is within normal limits with no palpable masses. - She reports a lump in the upper mid back.  There is a subcutaneous mass with slip sign consistent with lipoma. - Labs reviewed by me (10/01/2022) shows normal LFTs and CBC. - Patient history/ROS negative for any "red flag" symptoms of recurrence - Most recent mammogram (10/01/2022) BI-RADS Category 2, benign.  No mammographic evidence of malignancy in either breast. - PLAN: Labs and office visit in 6 months. - Next mammogram due April 2025   2.  Stage I left breast cancer: - Diagnosed 01/07/2012, ER/PR positive, status post lumpectomy and SLNB. - XRT completed in November 2013. - Took 5 years of anastrozole.   3.  Osteopenia - DEXA scan on 11/20/2020 with T score -1.6, osteopenia. - She is taking vitamin D  2 tablets daily - Vitamin D level normal 39.69 (10/01/2022)  - PLAN: Due for repeat bone scan in June 2024. - Continue calcium, vitamin D, and weightbearing exercises for breast cancer therapy associated bone loss.   4.  Other history -PMH also significant for Parkinson's disease and hypertension - She lives with husband at home and is independent of ADLs.  Uses a wheelchair for mobility but does  sometimes walk with the help of a walker.  Denies any falls. - Quit smoking in 2000.  Smoked 3 to 4 cigarettes/Torres for many years. - Brother had pancreatic cancer.  Mother had colon cancer.  No family history of breast cancers.   PLAN SUMMARY: >> DEXA bone density scan around 11/22/2022 >> Labs in 6 months = CBC/D, CMP, vitamin D >> OFFICE visit in 6 months    REVIEW OF SYSTEMS:   Review of Systems  Constitutional:  Positive for fatigue. Negative for appetite change, chills, diaphoresis, fever and unexpected weight change.  HENT:   Negative for lump/mass and nosebleeds.   Eyes:  Negative for eye problems.  Respiratory:  Negative for cough, hemoptysis and shortness of breath (With exertion, at baseline).   Cardiovascular:  Negative for chest pain, leg swelling and palpitations.  Gastrointestinal:  Positive for constipation. Negative for abdominal pain, blood in stool, diarrhea, nausea and vomiting.  Genitourinary:  Negative for hematuria.   Skin: Negative.   Neurological:  Positive for dizziness and numbness. Negative for headaches and light-headedness.  Hematological:  Does not bruise/bleed easily.    PHYSICAL EXAM:   Performance status (ECOG): 3 - Symptomatic, >50% confined to bed (presents in wheelchair)  There were no vitals filed for this visit. Wt Readings from Last 3 Encounters:  11/09/20 165 lb (74.8 kg)  11/01/20 160 lb (72.6 kg)  10/19/20 166 lb (75.3 kg)   Physical Exam Constitutional:      Appearance: Normal appearance. She is obese.     Comments: Presents in wheelchair  HENT:     Head: Normocephalic and atraumatic.     Mouth/Throat:     Mouth: Mucous membranes are moist.  Eyes:     Extraocular Movements: Extraocular movements intact.     Pupils: Pupils are equal, round, and reactive to light.  Cardiovascular:     Rate and Rhythm: Normal rate and regular rhythm.     Pulses: Normal pulses.     Heart sounds: Normal heart sounds.  Pulmonary:     Effort:  Pulmonary effort is normal.     Breath sounds: Normal breath sounds.  Chest:     Comments: Lumpectomy scars within normal limits.  No discrete nodules or palpable masses within either breast.  No palpable lymphadenopathy supraclavicular, axillary, epitrochlear, or pectoral lymph nodes.   Abdominal:     General: Bowel sounds are normal.     Palpations: Abdomen is soft.     Tenderness: There is no abdominal tenderness.  Musculoskeletal:        General: No swelling.     Right lower leg: No edema.     Left lower leg: No edema.  Lymphadenopathy:     Cervical: No cervical adenopathy.  Skin:    General: Skin is warm and dry.  Neurological:     General: No focal deficit present.     Mental Status: She is alert and oriented to person, place, and time.  Psychiatric:        Mood and Affect: Mood normal.        Behavior:  Behavior normal.      PAST MEDICAL/SURGICAL HISTORY:  Past Medical History:  Diagnosis Date   Breast cancer (HCC) 11/2011   Chronic back pain    Chronic leg pain    bilateral   Chronic leukopenia    DDD (degenerative disc disease), cervical    DDD (degenerative disc disease), lumbar    Gait abnormality    GERD (gastroesophageal reflux disease)    Hypertension    Hypothyroidism    Parkinson's disease    Radiation 04/2012   33 treatments for breast cancer   Right knee pain    Thyroid disease    Past Surgical History:  Procedure Laterality Date   ABDOMINAL HYSTERECTOMY     partial   BREAST LUMPECTOMY Left 12/2011   CHOLECYSTECTOMY     COLONOSCOPY N/A 07/01/2013   Procedure: COLONOSCOPY;  Surgeon: Malissa Hippo, MD;  Location: [facility];  Service: Endoscopy;  Laterality: N/A;  1030   COLONOSCOPY N/A 09/15/2019   Procedure: COLONOSCOPY;  Surgeon: Malissa Hippo, MD;  Location: [facility];  Service: Endoscopy;  Laterality: N/A;  815   ESOPHAGOGASTRODUODENOSCOPY N/A 11/21/2017   Procedure: ESOPHAGOGASTRODUODENOSCOPY (EGD);  Surgeon: Malissa Hippo, MD;   Location: [facility];  Service: Endoscopy;  Laterality: N/A;  2:45   EXCISIONAL TOTAL KNEE ARTHROPLASTY WITH ANTIBIOTIC SPACERS Right 01/12/2018   Procedure: EXCISIONAL TOTAL KNEE ARTHROPLASTY WITH ANTIBIOTIC SPACERS;  Surgeon: Dannielle Huh, MD;  Location: [facility];  Service: Orthopedics;  Laterality: Right;   JOINT REPLACEMENT  01/2017   right knee   KNEE SURGERY Right 01/12/2018   EXCISIONAL TOTAL KNEE ARTHROPLASTY WITH ANTIBIOTIC SPACERS   MASTECTOMY, PARTIAL Right 11/03/2020   Procedure: RIGHT PARTIAL MASTECTOMY AFTER TAG PLACEMENT;  Surgeon: Franky Macho, MD;  Location: [facility];  Service: General;  Laterality: Right;   POLYPECTOMY  09/15/2019   Procedure: POLYPECTOMY;  Surgeon: Malissa Hippo, MD;  Location: [facility];  Service: Endoscopy;;  Cold Snare transverse colon polyp   TOTAL KNEE REVISION Right 03/23/2018   Procedure: RIGHT TOTAL KNEE REVISION;  Surgeon: Dannielle Huh, MD;  Location: [facility];  Service: Orthopedics;  Laterality: Right;   TUBAL LIGATION      SOCIAL HISTORY:  Social History   Socioeconomic History   Marital status: Widowed    Spouse name: Maisie Fus   Number of children: 3   Years of education: 12 th   Highest education level: Not on file  Occupational History    Comment: retired  Tobacco Use   Smoking status: Former    Packs/Torres: 0.50    Years: 18.00    Additional pack years: 0.00    Total pack years: 9.00    Types: Cigarettes    Quit date: 09/30/1998    Years since quitting: 24.0   Smokeless tobacco: Never  Vaping Use   Vaping Use: Never used  Substance and Sexual Activity   Alcohol use: No   Drug use: No   Sexual activity: Not on file  Other Topics Concern   Not on file  Social History Narrative   Patient lives at home with her husband Maisie Fus).    Retired    [employer] school education   Right handed   Social Determinants of Health   Financial Resource Strain: Low Risk  (10/05/2020)   Overall Financial Resource Strain (CARDIA)    Difficulty of  Paying Living Expenses: Not very hard  Food Insecurity: No Food Insecurity (10/05/2020)   Hunger Vital Sign  Worried About Programme researcher, broadcasting/film/video in the Last Year: Never true    Ran Out of Food in the Last Year: Never true  Transportation Needs: No Transportation Needs (10/05/2020)   PRAPARE - Administrator, Civil Service (Medical): No    Lack of Transportation (Non-Medical): No  Physical Activity: Inactive (10/05/2020)   Exercise Vital Sign    Days of Exercise per Week: 0 days    Minutes of Exercise per Session: 0 min  Stress: No Stress Concern Present (10/05/2020)   [employer] of Occupational Health - Occupational Stress Questionnaire    Feeling of Stress : Only a little  Social Connections: Socially Integrated (10/05/2020)   Social Connection and Isolation Panel [NHANES]    Frequency of Communication with Friends and Family: More than three times a week    Frequency of Social Gatherings with Friends and Family: More than three times a week    Attends Religious Services: More than 4 times per year    Active Member of [employer] or Organizations: No    Attends Engineer, structural: More than 4 times per year    Marital Status: Married  Catering manager Violence: Not At Risk (10/05/2020)   Humiliation, Afraid, Rape, and Kick questionnaire    Fear of Current or Ex-Partner: No    Emotionally Abused: No    Physically Abused: No    Sexually Abused: No    FAMILY HISTORY:  Family History  Problem Relation Age of Onset   Cancer Mother 64       colon cancer   Cancer Father 7       throat cancer   Pancreatic cancer Brother        [phone] 11-14-15    CURRENT MEDICATIONS:  Current Outpatient Medications  Medication Sig Dispense Refill   amLODipine (NORVASC) 5 MG tablet Take 5 mg by mouth daily.      aspirin 325 MG EC tablet Take 1 tablet (325 mg total) by mouth 2 (two) times daily. 30 tablet 0   B Complex Vitamins (B COMPLEX PO) Place 1 tablet under the tongue daily.      baclofen (LIORESAL) 10 MG tablet Take 10 mg by mouth at bedtime.      calcium-vitamin D (OSCAL WITH D) 500-200 MG-UNIT tablet Take 1 tablet by mouth daily with breakfast. 90 tablet 1   carbidopa-levodopa (SINEMET IR) 25-100 MG tablet Take 2 tablets by mouth 3 (three) times daily.     Cyanocobalamin (VITAMIN B 12 PO) Take by mouth.     diazepam (VALIUM) 5 MG tablet Take 5 mg by mouth 2 (two) times daily.     ergocalciferol (VITAMIN D2) 1.25 MG (50000 UT) capsule Take by mouth.     gabapentin (NEURONTIN) 300 MG capsule Take 1 capsule (300 mg total) by mouth 3 (three) times daily. 90 capsule 11   letrozole (FEMARA) 2.5 MG tablet TAKE ONE TABLET (2.5 MG TOTAL) BY MOUTH DAILY 90 tablet 1   levothyroxine (SYNTHROID, LEVOTHROID) 100 MCG tablet Take 100 mcg by mouth daily before breakfast.     MELATONIN PO Take by mouth.     pantoprazole (PROTONIX) 40 MG tablet Take 1 tablet (40 mg total) by mouth daily before breakfast. 90 tablet 3   No current facility-administered medications for this visit.    ALLERGIES:  Allergies  Allergen Reactions   Hydrocodone Other (See Comments)    Heart pain  Delusions   Tramadol Other (See Comments)    Hallucinations, abd  pain Delusions (intolerance)    LABORATORY DATA:  I have reviewed the labs as listed.     Latest Ref Rng & Units 10/01/2022   12:36 PM 04/16/2022    2:27 PM 08/20/2021    2:47 PM  CBC  WBC 4.0 - 10.5 K/uL 4.1  3.6  4.3   Hemoglobin 12.0 - 15.0 g/dL 16.1  09.6  04.5   Hematocrit 36.0 - 46.0 % 45.8  41.5  40.1   Platelets 150 - 400 K/uL 149  195  292       Latest Ref Rng & Units 10/01/2022   12:36 PM 04/16/2022    2:27 PM 08/20/2021    2:47 PM  CMP  Glucose 70 - 99 mg/dL 77  87  83   BUN 8 - 23 mg/dL 12  10  10    Creatinine 0.44 - 1.00 mg/dL 4.09  8.11  9.14   Sodium 135 - 145 mmol/L 138  138  141   Potassium 3.5 - 5.1 mmol/L 3.9  4.4  3.6   Chloride 98 - 111 mmol/L 103  104  103   CO2 22 - 32 mmol/L 25  27  30    Calcium 8.9 -  10.3 mg/dL 9.5  9.0  9.7   Total Protein 6.5 - 8.1 g/dL 8.9  7.8  8.3   Total Bilirubin 0.3 - 1.2 mg/dL 0.6  0.6  0.3   Alkaline Phos 38 - 126 U/L 104  98  100   AST 15 - 41 U/L 22  21  32   ALT 0 - 44 U/L 13  5  6      DIAGNOSTIC IMAGING:  I have independently reviewed the scans and discussed with the patient. MM DIAG BREAST TOMO BILATERAL  Result Date: 10/01/2022 CLINICAL DATA:  77 year old female with right breast cancer post lumpectomy 2022 as well as left lumpectomy 2013. Images were performed while patient was in a wheelchair and therefore a spot compression magnification view of the lumpectomy site could not be performed today. EXAM: DIGITAL DIAGNOSTIC BILATERAL MAMMOGRAM WITH TOMOSYNTHESIS TECHNIQUE: Bilateral digital diagnostic mammography and breast tomosynthesis was performed. COMPARISON:  Previous exam(s). ACR Breast Density Category b: There are scattered areas of fibroglandular density. FINDINGS: No suspicious masses or calcifications are seen in either breast. Lumpectomy changes again identified in the upper-outer posterior right breast. There is no mammographic evidence of malignancy in either breast. IMPRESSION: Stable lumpectomy changes in the right breast. No mammographic evidence of malignancy in either breast. RECOMMENDATION: 1.  Screening mammogram in one year.(Code:SM-B-01Y) 2. The patient is now two or more years post lumpectomy and therefore may return to annual screening mammography, however given history of breast cancer the patient does remain eligible for annual diagnostic mammography if indicated. I have discussed the findings and recommendations with the patient. If applicable, a reminder letter will be sent to the patient regarding the next appointment. BI-RADS CATEGORY  2: Benign. Electronically Signed   By: Edwin Cap M.D.   On: 10/01/2022 12:11     WRAP UP:  All questions were answered. The patient knows to call the clinic with any problems, questions or  concerns.  Medical decision making: Moderate  Time spent on visit: I spent 20 minutes counseling the patient face to face. The total time spent in the appointment was 30 minutes and more than 50% was on counseling.  Carnella Guadalajara, PA-C  10/21/2022 2:37 PM

## 2022-10-21 ENCOUNTER — Inpatient Hospital Stay: Payer: No Typology Code available for payment source | Attending: Physician Assistant | Admitting: Physician Assistant

## 2022-10-21 VITALS — BP 157/72 | HR 70 | Temp 98.2°F | Resp 18

## 2022-10-21 DIAGNOSIS — M8580 Other specified disorders of bone density and structure, unspecified site: Secondary | ICD-10-CM | POA: Insufficient documentation

## 2022-10-21 DIAGNOSIS — Z79811 Long term (current) use of aromatase inhibitors: Secondary | ICD-10-CM | POA: Diagnosis not present

## 2022-10-21 DIAGNOSIS — G20A1 Parkinson's disease without dyskinesia, without mention of fluctuations: Secondary | ICD-10-CM | POA: Diagnosis not present

## 2022-10-21 DIAGNOSIS — Z800 Family history of malignant neoplasm of digestive organs: Secondary | ICD-10-CM | POA: Insufficient documentation

## 2022-10-21 DIAGNOSIS — Z170 Estrogen receptor positive status [ER+]: Secondary | ICD-10-CM | POA: Diagnosis not present

## 2022-10-21 DIAGNOSIS — Z87891 Personal history of nicotine dependence: Secondary | ICD-10-CM | POA: Insufficient documentation

## 2022-10-21 DIAGNOSIS — I10 Essential (primary) hypertension: Secondary | ICD-10-CM | POA: Diagnosis not present

## 2022-10-21 DIAGNOSIS — C50411 Malignant neoplasm of upper-outer quadrant of right female breast: Secondary | ICD-10-CM | POA: Diagnosis not present

## 2022-10-21 DIAGNOSIS — Z853 Personal history of malignant neoplasm of breast: Secondary | ICD-10-CM | POA: Diagnosis not present

## 2022-10-21 NOTE — Patient Instructions (Addendum)
[facility] **VISIT SUMMARY & IMPORTANT INSTRUCTIONS **   You were seen today by Rojelio Brenner PA-C for your history of breast cancer.    HISTORY OF BREAST CANCER:  Your most recent mammogram, labs, and physical exam today did not show any signs of recurrent cancer.   MEDICATIONS: - Continue letrozole (Femara), which is her breast cancer pill, once daily - Continue vitamin D and calcium supplements  OTHER TESTS: Bone density scan in June 2024  FOLLOW-UP APPOINTMENT: Office visit in about 6 months, 1 week after labs  ** Thank you for trusting me with your healthcare!  I strive to provide all of my patients with quality care at each visit.  If you receive a survey for this visit, I would be so grateful to you for taking the time to provide feedback.  Thank you in advance!  ~ Shanelle Clontz                   Dr. Doreatha Massed   &   Rojelio Brenner, PA-C   - - - - - - - - - - - - - - - - - -    Thank you for choosing [facility] at [facility] to provide your oncology and hematology care.  To afford each patient quality time with our provider, please arrive at least 15 minutes before your scheduled appointment time.   If you have a lab appointment with the Cancer Center please come in thru the Main Entrance and check in at the main information desk.  You need to re-schedule your appointment should you arrive 10 or more minutes late.  We strive to give you quality time with our providers, and arriving late affects you and other patients whose appointments are after yours.  Also, if you no show three or more times for appointments you may be dismissed from the clinic at the providers discretion.     Again, thank you for choosing [facility].  Our hope is that these requests will decrease the amount of time that you wait before being seen by our physicians.        _____________________________________________________________  Should you have questions after your visit to [facility], please contact our office at [phone] and follow the prompts.  Our office hours are 8:00 a.m. and 4:30 p.m. Monday - Friday.  Please note that voicemails left after 4:00 p.m. may not be returned until the following business day.  We are closed weekends and major holidays.  You do have access to a nurse 24-7, just call the main number to the clinic [phone] and do not press any options, hold on the line and a nurse will answer the phone.    For prescription refill requests, have your pharmacy contact our office and allow 72 hours.

## 2022-10-22 ENCOUNTER — Other Ambulatory Visit: Payer: Self-pay

## 2022-10-22 DIAGNOSIS — M8580 Other specified disorders of bone density and structure, unspecified site: Secondary | ICD-10-CM

## 2022-10-22 DIAGNOSIS — E559 Vitamin D deficiency, unspecified: Secondary | ICD-10-CM

## 2022-10-22 DIAGNOSIS — C50411 Malignant neoplasm of upper-outer quadrant of right female breast: Secondary | ICD-10-CM

## 2022-10-23 ENCOUNTER — Telehealth: Payer: Self-pay | Admitting: Neurology

## 2022-10-23 ENCOUNTER — Ambulatory Visit (INDEPENDENT_AMBULATORY_CARE_PROVIDER_SITE_OTHER): Payer: No Typology Code available for payment source | Admitting: Neurology

## 2022-10-23 VITALS — BP 132/73 | HR 62 | Ht 61.0 in

## 2022-10-23 DIAGNOSIS — M79671 Pain in right foot: Secondary | ICD-10-CM

## 2022-10-23 DIAGNOSIS — I10 Essential (primary) hypertension: Secondary | ICD-10-CM | POA: Diagnosis not present

## 2022-10-23 DIAGNOSIS — Z96651 Presence of right artificial knee joint: Secondary | ICD-10-CM

## 2022-10-23 DIAGNOSIS — G20B2 Parkinson's disease with dyskinesia, with fluctuations: Secondary | ICD-10-CM

## 2022-10-23 DIAGNOSIS — R2689 Other abnormalities of gait and mobility: Secondary | ICD-10-CM

## 2022-10-23 DIAGNOSIS — R269 Unspecified abnormalities of gait and mobility: Secondary | ICD-10-CM

## 2022-10-23 MED ORDER — CARBIDOPA-LEVODOPA 25-100 MG PO TABS: 2.0000 | ORAL_TABLET | Freq: Three times a day (TID) | ORAL | 3 refills | Status: DC

## 2022-10-23 NOTE — Procedures (Signed)
Full Name: Lisa Torres Gender: Female MRN #: [id number] Date of Birth: Aug 25, 1945    Visit Date: 10/23/2022 09:21 Age: 77 Years Examining Physician: Dr. Levert Feinstein Referring Physician: Dr. Levert Feinstein Height: 5 feet 1 inch History: 77 year old female with history of Parkinson's disease, right knee replacement and redo, now presenting with fixed contraction of bilateral knee, right worse than left, worsening gait abnormality  Summary of the test: Nerve conduction study: Bilateral sural, superficial peroneal sensory responses were normal. Right median sensory response showed moderately prolonged peak latency, with mildly decreased snap amplitude.  Right ulnar sensory responses were normal.  Left tibial motor responses were normal.  Right tibial motor responses was absent, it was technically difficult due to her previous right knee surgery, Right peroneal to EDB motor responses were normal Right ulnar motor responses were normal.  Right median motor responses showed mildly prolonged distal latency with normal CMAP amplitude  Electromyography: Selected needle examination of bilateral lower extremity muscles and lumbosacral paraspinal muscles showed no significant abnormalities.  Conclusion: This is mild abnormal study.  There is evidence of moderate right carpal tunnel syndromes.  But there is no evidence of large fiber peripheral neuropathy, intrinsic muscle disease.    ------------------------------- Levert Feinstein, M.D. PhD  [facility] [address], [address] [place], [place] [address] Tel: [phone] Fax: [phone]  Verbal informed consent was obtained from the patient, patient was informed of potential risk of procedure, including bruising, bleeding, hematoma formation, infection, muscle weakness, muscle pain, numbness, among others.        MNC    Nerve / Sites Muscle Latency Ref. Amplitude Ref. Rel Amp Segments Distance Velocity Ref. Area     ms ms mV mV %  cm m/s m/s mVms  R Median - APB     Wrist APB 4.5 ?4.4 6.5 ?4.0 100 Wrist - APB 7   26.8     Upper arm APB 8.9  6.1  93.6 Upper arm - Wrist 24 54 ?49 25.8  R Ulnar - ADM     Wrist ADM 3.7 ?3.3 11.0 ?6.0 100 Wrist - ADM 7   40.9     B.Elbow ADM 6.2  10.8  98.3 B.Elbow - Wrist 12 48 ?49 40.9     A.Elbow ADM 9.3  9.3  86.2 A.Elbow - B.Elbow 15 48 ?49 36.5  R Peroneal - EDB     Ankle EDB 4.7 ?6.5 4.5 ?2.0 100 Ankle - EDB 9   13.0     Fib head EDB 11.0  3.8  85 Fib head - Ankle 24 38 ?44 11.2     Pop fossa EDB 12.2  4.9  130 Pop fossa - Fib head 7 56 ?44 14.5         Pop fossa - Ankle      L Peroneal - EDB     Ankle EDB 2.2 ?6.5  ?2.0  Ankle - EDB 9            Pop fossa - Ankle      R Tibial - AH     Ankle AH 6.8 ?5.8 0.5 ?4.0 100 Ankle - AH 9   1.4  L Tibial - AH     Ankle AH 4.4 ?5.8 10.2 ?4.0 100 Ankle - AH 9   20.6     Pop fossa AH 11.0  4.2  41.4 Pop fossa - Ankle 33 50 ?41 10.8  SNC    Nerve / Sites Rec. Site Peak Lat Ref.  Amp Ref. Segments Distance    ms ms V V  cm  R Ulnar - Digit V (Antidromic)     Wrist Dig V 2.5 ?3.1 18 ?17 Wrist - Dig V 11  R Sural - Ankle (Calf)     Calf Ankle 3.9 ?4.4 7 ?6 Calf - Ankle 14  L Sural - Ankle (Calf)     Calf Ankle 3.2 ?4.4 9 ?6 Calf - Ankle 14  R Superficial peroneal - Ankle     Lat leg Ankle 4.0 ?4.4 5 ?6 Lat leg - Ankle 14  L Superficial peroneal - Ankle     Lat leg Ankle 3.2 ?4.4 9 ?6 Lat leg - Ankle 14  R Median - Orthodromic (Dig II, Mid palm)     Dig II Wrist 4.0 ?3.4 6 ?10 Dig II - Wrist 57                   F  Wave    Nerve F Lat Ref.   ms ms  L Tibial - AH 49.8 ?56.0  R Ulnar - ADM 31.3 ?32.0         EMG Summary Table    Spontaneous MUAP Recruitment  Muscle IA Fib PSW Fasc Other Amp Dur. Poly Pattern  R. Tibialis anterior Normal None None None _______ Normal Normal Normal Normal  R. Peroneus longus Normal None None None _______ Normal Normal Normal Normal  R. Gastrocnemius (Medial  head) Normal None None None _______ Normal Normal Normal Normal  R. Vastus lateralis Normal None None None _______ Normal Normal Normal Reduced  R. Tibialis posterior Normal None None None _______ Normal Normal Normal Normal  L. Tibialis anterior Normal None None None _______ Normal Normal Normal Normal  L. Tibialis posterior Normal None None None _______ Normal Normal Normal Normal  L. Peroneus longus Normal None None None _______ Normal Normal Normal Normal  L. Gastrocnemius (Medial head) Normal None None None _______ Normal Normal Normal Normal  L. Vastus lateralis Normal None None None _______ Normal Normal Normal Normal  R. Lumbar paraspinals (mid) Normal None None None _______ Normal Normal Normal Normal  R. Lumbar paraspinals (low) Normal None None None _______ Normal Normal Normal Normal  L. Lumbar paraspinals (low) Normal None None None _______ Normal Normal Normal Normal  L. Lumbar paraspinals (mid) Normal None None None _______ Normal Normal Normal Normal

## 2022-10-23 NOTE — Progress Notes (Signed)
Chief Complaint  Patient presents with   Procedure    Rm EMG/NCV 4.      ASSESSMENT AND PLAN  Lisa Torres is a 77 y.o. female   Parkinson disease with dyskinesia,  Will decrease Sinemet to 25/100 mg 2 tablets 3 times a day from 3 tablets 3 times a day, Gait abnormality Fixed contraction of bilateral knee, history of failed right knee replacement, and right knee revision, wheelchair-bound since 2019 Significant knee pain upon passive stretch, limited range of motion, has lost follow-up with orthopedic surgeon, will refer back to Dr. Lequita Halt   Her gait abnormality multifactorial fixed contraction of bilateral knee, knee pain, parkinsonian syndrome all contributed to,  MRI of the brain is pending  DIAGNOSTIC DATA (LABS, IMAGING, TESTING) - I reviewed patient records, labs, notes, testing and imaging myself where available.   MEDICAL HISTORY:  Lisa Torres, is a 77 year old female, seen in request by her primary care physician from [facility] Dr. Fara Chute for evaluation of gait abnormality, left foot pain, she is accompanied by her son at today's visit July 22, 2022  I reviewed and summarized the referring note. PMHX HTN Hypothyroidism History of breast cancer, s/p double mastectomy, s/p radiation therapy. Right knee replacement in 2018, revision in 2019,  I saw her previously in 2016, at that time she was seen for right hand tremor started around 2014, right shoulder achiness, difficulty initiate steps, gait change, her exam are consistent with Parkinson's disease, she was treated with Mirapex, reported did help her symptoms, at that time MRI of the brain, laboratory evaluation showed no significant abnormalities  Later evaluation also demonstrated MRI of lumbar, thoracic spine degenerative changes but no significant canal foraminal stenosis, examination in 2018 document stiff cautious gait with small stride,  She underwent right knee replacement first in  2018, then revision by Dr. Georgena Spurling, for failed right total knee replacement  Since then, she has been wheelchair-bound, she used to live alone, lost her husband in February 2023, her son just moved in with her a week ago,  She was seen by Dr. Judithann Sheen for few years, now on Sinemet 25/100 mg 2 tablets 3 times a day, was not sure about the benefit, because she is no longer ambulatory, walking her wheelchair, need assistant to transfer, denies bowel bladder incontinence  Over the years, she has developed fixed contraction of bilateral knee, significant right knee pain, also right ankle fixed plantarflexion, right lower extremity pain paresthesia below knee level,  UPDATE May 8th 2024: She return for electrodiagnostic study today, which showed evidence of moderate carpal tunnel syndrome, no evidence of intrinsic muscle disease or large fiber peripheral neuropathy to explain her gait abnormality,  She was noted to have significant dyskinesia, now on Sinemet 25/100 mg 3 tablets 3 times a day, will decrease to 2 tablets 3 times a day,  She has significant limited range of motion of bilateral knee, worsening on the right side, moderate to severe pain with passive stretch, has lost follow-up with orthopedic surgeon for few years  PHYSICAL EXAM:   Vitals:   10/23/22 0923  BP: 132/73  Pulse: 62  Height: 5\' 1"  (1.549 m)   Body mass index is 31.18 kg/m.  PHYSICAL EXAMNIATION:  Gen: NAD, conversant, well nourised, well groomed                     Cardiovascular: Regular rate rhythm, no peripheral edema, warm, nontender. Eyes: Conjunctivae clear without exudates or hemorrhage  Neck: Supple, no carotid bruits. Pulmonary: Clear to auscultation bilaterally   NEUROLOGICAL EXAM:  MENTAL STATUS: Speech/cognition: Awake, alert, oriented to history taking and casual conversation CRANIAL NERVES: CN II: Visual fields are full to confrontation. Pupils are round equal and briskly reactive to  light. CN III, IV, VI: extraocular movement are normal. No ptosis. CN V: Facial sensation is intact to light touch CN VII: Face is symmetric with normal eye closure  CN VIII: Hearing is normal to causal conversation. CN IX, X: Phonation is normal. CN XI: Head turning and shoulder shrug are intact  MOTOR: Upper extremity motor strength is normal, right more than left bradykinesia, rigidity, Frequent dyskinesia movement of bilateral lower extremity, right worse than left,   She has fixed contraction of bilateral knee, maximum range of motion of left knee is 150 degree, no significant muscle weakness  Fixed contraction of right knee maximum is extension 110 degree, right ankle tends to stay in plantarflexion position, there was no significant muscle weakness,  REFLEXES: Reflexes are 2+ and symmetric at the biceps, triceps, absent at knees, and ankles. Plantar responses are extensor on the right, flaccid on left,  SENSORY: Intact to light touch, pinprick and vibratory sensation are intact in fingers and toes.  COORDINATION: There is no trunk or limb dysmetria noted.  GAIT/STANCE: Deferred  REVIEW OF SYSTEMS:  Full 14 system review of systems performed and notable only for as above All other review of systems were negative.   ALLERGIES: Allergies  Allergen Reactions   Hydrocodone Other (See Comments)    Heart pain  Delusions   Tramadol Other (See Comments)    Hallucinations, abd pain Delusions (intolerance)    HOME MEDICATIONS: Current Outpatient Medications  Medication Sig Dispense Refill   amLODipine (NORVASC) 5 MG tablet Take 5 mg by mouth daily.      aspirin 325 MG EC tablet Take 1 tablet (325 mg total) by mouth 2 (two) times daily. 30 tablet 0   B Complex Vitamins (B COMPLEX PO) Place 1 tablet under the tongue daily.     baclofen (LIORESAL) 10 MG tablet Take 10 mg by mouth at bedtime.      calcium-vitamin D (OSCAL WITH D) 500-200 MG-UNIT tablet Take 1 tablet by mouth  daily with breakfast. 90 tablet 1   carbidopa-levodopa (SINEMET IR) 25-100 MG tablet Take 2 tablets by mouth 3 (three) times daily.     Cyanocobalamin (VITAMIN B 12 PO) Take by mouth.     diazepam (VALIUM) 5 MG tablet Take 5 mg by mouth 2 (two) times daily.     ergocalciferol (VITAMIN D2) 1.25 MG (50000 UT) capsule Take by mouth.     gabapentin (NEURONTIN) 300 MG capsule Take 1 capsule (300 mg total) by mouth 3 (three) times daily. 90 capsule 11   letrozole (FEMARA) 2.5 MG tablet TAKE ONE TABLET (2.5 MG TOTAL) BY MOUTH DAILY 90 tablet 1   levothyroxine (SYNTHROID) 88 MCG tablet Take 88 mcg by mouth every morning.     MELATONIN PO Take by mouth.     pantoprazole (PROTONIX) 40 MG tablet Take 1 tablet (40 mg total) by mouth daily before breakfast. 90 tablet 3   polyethylene glycol (MIRALAX / GLYCOLAX) 17 g packet Take by mouth.     traZODone (DESYREL) 50 MG tablet Take by mouth.     No current facility-administered medications for this visit.    PAST MEDICAL HISTORY: Past Medical History:  Diagnosis Date   Breast cancer (HCC) 11/2011  Chronic back pain    Chronic leg pain    bilateral   Chronic leukopenia    DDD (degenerative disc disease), cervical    DDD (degenerative disc disease), lumbar    Gait abnormality    GERD (gastroesophageal reflux disease)    Hypertension    Hypothyroidism    Parkinson's disease    Radiation 04/2012   33 treatments for breast cancer   Right knee pain    Thyroid disease     PAST SURGICAL HISTORY: Past Surgical History:  Procedure Laterality Date   ABDOMINAL HYSTERECTOMY     partial   BREAST LUMPECTOMY Left 12/2011   CHOLECYSTECTOMY     COLONOSCOPY N/A 07/01/2013   Procedure: COLONOSCOPY;  Surgeon: Malissa Hippo, MD;  Location: [facility];  Service: Endoscopy;  Laterality: N/A;  1030   COLONOSCOPY N/A 09/15/2019   Procedure: COLONOSCOPY;  Surgeon: Malissa Hippo, MD;  Location: [facility];  Service: Endoscopy;  Laterality: N/A;  815    ESOPHAGOGASTRODUODENOSCOPY N/A 11/21/2017   Procedure: ESOPHAGOGASTRODUODENOSCOPY (EGD);  Surgeon: Malissa Hippo, MD;  Location: [facility];  Service: Endoscopy;  Laterality: N/A;  2:45   EXCISIONAL TOTAL KNEE ARTHROPLASTY WITH ANTIBIOTIC SPACERS Right 01/12/2018   Procedure: EXCISIONAL TOTAL KNEE ARTHROPLASTY WITH ANTIBIOTIC SPACERS;  Surgeon: Dannielle Huh, MD;  Location: [facility];  Service: Orthopedics;  Laterality: Right;   JOINT REPLACEMENT  01/2017   right knee   KNEE SURGERY Right 01/12/2018   EXCISIONAL TOTAL KNEE ARTHROPLASTY WITH ANTIBIOTIC SPACERS   MASTECTOMY, PARTIAL Right 11/03/2020   Procedure: RIGHT PARTIAL MASTECTOMY AFTER TAG PLACEMENT;  Surgeon: Franky Macho, MD;  Location: [facility];  Service: General;  Laterality: Right;   POLYPECTOMY  09/15/2019   Procedure: POLYPECTOMY;  Surgeon: Malissa Hippo, MD;  Location: [facility];  Service: Endoscopy;;  Cold Snare transverse colon polyp   TOTAL KNEE REVISION Right 03/23/2018   Procedure: RIGHT TOTAL KNEE REVISION;  Surgeon: Dannielle Huh, MD;  Location: [facility];  Service: Orthopedics;  Laterality: Right;   TUBAL LIGATION      FAMILY HISTORY: Family History  Problem Relation Age of Onset   Cancer Mother 49       colon cancer   Cancer Father 56       throat cancer   Pancreatic cancer Brother        63y 11-14-15    SOCIAL HISTORY: Social History   Socioeconomic History   Marital status: Widowed    Spouse name: Maisie Fus   Number of children: 3   Years of education: 12 th   Highest education level: Not on file  Occupational History    Comment: retired  Tobacco Use   Smoking status: Former    Packs/day: 0.50    Years: 18.00    Additional pack years: 0.00    Total pack years: 9.00    Types: Cigarettes    Quit date: 09/30/1998    Years since quitting: 24.0   Smokeless tobacco: Never  Vaping Use   Vaping Use: Never used  Substance and Sexual Activity   Alcohol use: No   Drug use: No   Sexual activity: Not on file   Other Topics Concern   Not on file  Social History Narrative   Patient lives at home with her husband Maisie Fus).    Retired    [employer] school education   Right handed   Social Determinants of Health   Financial Resource Strain: Low Risk  (10/05/2020)  Overall Financial Resource Strain (CARDIA)    Difficulty of Paying Living Expenses: Not very hard  Food Insecurity: No Food Insecurity (10/05/2020)   Hunger Vital Sign    Worried About Running Out of Food in the Last Year: Never true    Ran Out of Food in the Last Year: Never true  Transportation Needs: No Transportation Needs (10/05/2020)   PRAPARE - Administrator, Civil Service (Medical): No    Lack of Transportation (Non-Medical): No  Physical Activity: Inactive (10/05/2020)   Exercise Vital Sign    Days of Exercise per Week: 0 days    Minutes of Exercise per Session: 0 min  Stress: No Stress Concern Present (10/05/2020)   [employer] of Occupational Health - Occupational Stress Questionnaire    Feeling of Stress : Only a little  Social Connections: Socially Integrated (10/05/2020)   Social Connection and Isolation Panel [NHANES]    Frequency of Communication with Friends and Family: More than three times a week    Frequency of Social Gatherings with Friends and Family: More than three times a week    Attends Religious Services: More than 4 times per year    Active Member of [employer] or Organizations: No    Attends Engineer, structural: More than 4 times per year    Marital Status: Married  Catering manager Violence: Not At Risk (10/05/2020)   Humiliation, Afraid, Rape, and Kick questionnaire    Fear of Current or Ex-Partner: No    Emotionally Abused: No    Physically Abused: No    Sexually Abused: No      Levert Feinstein, M.D. Ph.D.  [facility] [address] [place], [place] [address] Ph: [phone] Fax: [phone]  CC:  Estanislado Pandy, MD [address] [place],  [place] [address]  Estanislado Pandy, MD

## 2022-10-23 NOTE — Telephone Encounter (Signed)
Referral sent to [facility]: Phone: [phone] Fax: [phone]

## 2022-10-23 NOTE — Patient Instructions (Signed)
Lisa Rankin Aluisio, MD [place]opedic [place] Address: [address], [place], [place] [address] Phone: [phone]

## 2022-10-29 NOTE — Telephone Encounter (Signed)
I had to redo the [facility] because I had the wrong NPI on it. [employer] auth: [id number] exp. 10/29/22-12/29/22 for [employer].

## 2022-10-31 ENCOUNTER — Ambulatory Visit (HOSPITAL_COMMUNITY): Payer: No Typology Code available for payment source

## 2022-11-20 NOTE — Telephone Encounter (Signed)
[employer] Berkley Harvey: [id number] exp. 11/20/22-12/19/22 for [employer]

## 2022-11-25 ENCOUNTER — Other Ambulatory Visit (HOSPITAL_COMMUNITY): Payer: No Typology Code available for payment source

## 2022-11-25 ENCOUNTER — Ambulatory Visit (HOSPITAL_COMMUNITY)
Admission: RE | Admit: 2022-11-25 | Discharge: 2022-11-25 | Disposition: A | Discharge status: Home / Self Care | Payer: No Typology Code available for payment source | Source: Ambulatory Visit | Attending: Neurology | Admitting: Neurology

## 2022-11-25 DIAGNOSIS — R269 Unspecified abnormalities of gait and mobility: Secondary | ICD-10-CM | POA: Diagnosis not present

## 2022-11-25 DIAGNOSIS — M79671 Pain in right foot: Secondary | ICD-10-CM | POA: Insufficient documentation

## 2022-11-25 DIAGNOSIS — Z96651 Presence of right artificial knee joint: Secondary | ICD-10-CM | POA: Insufficient documentation

## 2022-11-25 DIAGNOSIS — R519 Headache, unspecified: Secondary | ICD-10-CM | POA: Diagnosis not present

## 2022-11-25 DIAGNOSIS — G9389 Other specified disorders of brain: Secondary | ICD-10-CM | POA: Diagnosis not present

## 2022-12-10 ENCOUNTER — Encounter: Payer: Self-pay | Admitting: Podiatry

## 2022-12-10 ENCOUNTER — Ambulatory Visit: Payer: No Typology Code available for payment source | Admitting: Podiatry

## 2022-12-10 DIAGNOSIS — M21541 Acquired clubfoot, right foot: Secondary | ICD-10-CM

## 2022-12-10 DIAGNOSIS — M79675 Pain in left toe(s): Secondary | ICD-10-CM

## 2022-12-10 DIAGNOSIS — M79674 Pain in right toe(s): Secondary | ICD-10-CM | POA: Diagnosis not present

## 2022-12-10 DIAGNOSIS — M76821 Posterior tibial tendinitis, right leg: Secondary | ICD-10-CM

## 2022-12-10 DIAGNOSIS — B351 Tinea unguium: Secondary | ICD-10-CM

## 2022-12-10 NOTE — Progress Notes (Signed)
This patient presents to the office with chief complaint of long thick painful nails.  Patient says the nails are painful walking and wearing shoes.  This patient is unable to self treat.  This patient is unable to trim her nails since she is unable to reach her nails. She says she received an injection last visit which worked well and requests another injection this visit. She presents to the office for preventative foot care services.  General Appearance  Alert, conversant and in no acute stress.  Vascular  Dorsalis pedis and posterior tibial  pulses are palpable  bilaterally.  Capillary return is within normal limits  bilaterally. Temperature is within normal limits  bilaterally.  Neurologic  Senn-Weinstein monofilament wire test within normal limits  bilaterally. Muscle power within normal limits bilaterally.  Nails Thick disfigured discolored nails with subungual debris  from hallux to fifth toes bilaterally. No evidence of bacterial infection or drainage bilaterally.  Orthopedic  No limitations of motion  feet .  No crepitus or effusions noted.  No bony pathology or digital deformities noted. Pain along the course PTT right foot.  Skin  normotropic skin with no porokeratosis noted bilaterally.  No signs of infections or ulcers noted.     Onychomycosis  Nails  B/L.  Pain in right toes  Pain in left toes Injection therapy using 1.0 cc. Of 2% xylocaine( 20 mg.) plus 1 cc. of kenalog-la ( 10 mg)   Debridement of nails both feet followed trimming the nails with dremel tool.  To see Dr.  Logan Bores for follow up of PTT.  RTC 3 months.   Helane Gunther DPM

## 2023-01-22 ENCOUNTER — Telehealth: Payer: Self-pay | Admitting: Neurology

## 2023-01-22 NOTE — Telephone Encounter (Signed)
Pt is asking for a call with results to MRI from the month of May

## 2023-01-30 DIAGNOSIS — E039 Hypothyroidism, unspecified: Secondary | ICD-10-CM | POA: Diagnosis not present

## 2023-01-30 DIAGNOSIS — E7849 Other hyperlipidemia: Secondary | ICD-10-CM | POA: Diagnosis not present

## 2023-01-30 DIAGNOSIS — I10 Essential (primary) hypertension: Secondary | ICD-10-CM | POA: Diagnosis not present

## 2023-01-30 DIAGNOSIS — E782 Mixed hyperlipidemia: Secondary | ICD-10-CM | POA: Diagnosis not present

## 2023-01-30 DIAGNOSIS — G20A1 Parkinson's disease without dyskinesia, without mention of fluctuations: Secondary | ICD-10-CM | POA: Diagnosis not present

## 2023-02-06 DIAGNOSIS — G20 Parkinson's disease: Secondary | ICD-10-CM | POA: Diagnosis not present

## 2023-02-06 DIAGNOSIS — Z6839 Body mass index (BMI) 39.0-39.9, adult: Secondary | ICD-10-CM | POA: Diagnosis not present

## 2023-02-06 DIAGNOSIS — E039 Hypothyroidism, unspecified: Secondary | ICD-10-CM | POA: Diagnosis not present

## 2023-02-06 DIAGNOSIS — K219 Gastro-esophageal reflux disease without esophagitis: Secondary | ICD-10-CM | POA: Diagnosis not present

## 2023-02-06 DIAGNOSIS — M25569 Pain in unspecified knee: Secondary | ICD-10-CM | POA: Diagnosis not present

## 2023-02-06 DIAGNOSIS — I10 Essential (primary) hypertension: Secondary | ICD-10-CM | POA: Diagnosis not present

## 2023-02-06 DIAGNOSIS — G4700 Insomnia, unspecified: Secondary | ICD-10-CM | POA: Diagnosis not present

## 2023-02-06 DIAGNOSIS — Z0001 Encounter for general adult medical examination with abnormal findings: Secondary | ICD-10-CM | POA: Diagnosis not present

## 2023-02-06 DIAGNOSIS — R7301 Impaired fasting glucose: Secondary | ICD-10-CM | POA: Diagnosis not present

## 2023-02-07 ENCOUNTER — Other Ambulatory Visit (HOSPITAL_COMMUNITY): Payer: No Typology Code available for payment source

## 2023-02-11 ENCOUNTER — Inpatient Hospital Stay (HOSPITAL_COMMUNITY): Admission: RE | Admit: 2023-02-11 | Payer: No Typology Code available for payment source | Source: Ambulatory Visit

## 2023-03-12 ENCOUNTER — Ambulatory Visit: Payer: No Typology Code available for payment source | Admitting: Podiatry

## 2023-03-17 ENCOUNTER — Ambulatory Visit (INDEPENDENT_AMBULATORY_CARE_PROVIDER_SITE_OTHER): Payer: No Typology Code available for payment source | Admitting: Podiatry

## 2023-03-17 DIAGNOSIS — M79674 Pain in right toe(s): Secondary | ICD-10-CM | POA: Diagnosis not present

## 2023-03-17 DIAGNOSIS — B351 Tinea unguium: Secondary | ICD-10-CM

## 2023-03-17 DIAGNOSIS — M76821 Posterior tibial tendinitis, right leg: Secondary | ICD-10-CM

## 2023-03-17 DIAGNOSIS — M79675 Pain in left toe(s): Secondary | ICD-10-CM | POA: Diagnosis not present

## 2023-03-17 MED ORDER — BETAMETHASONE SOD PHOS & ACET 6 (3-3) MG/ML IJ SUSP
3.0000 mg | Freq: Once | INTRAMUSCULAR | Status: AC
Start: 2023-03-17 — End: 2023-03-17
  Administered 2023-03-17: 3 mg via INTRA_ARTICULAR

## 2023-03-17 NOTE — Progress Notes (Signed)
   Chief Complaint  Patient presents with   RFC    RFC WANTS AN INJECTION    SUBJECTIVE Patient presents to office today complaining of elongated, thickened nails that cause pain while ambulating in shoes.  Patient is unable to trim their own nails.   Patient also experiences sharp shooting sensations along the medial aspect of the right foot.  She would like to have it evaluated today.  Patient is here for further evaluation and treatment.  Past Medical History:  Diagnosis Date   Breast cancer (HCC) 11/2011   Chronic back pain    Chronic leg pain    bilateral   Chronic leukopenia    DDD (degenerative disc disease), cervical    DDD (degenerative disc disease), lumbar    Gait abnormality    GERD (gastroesophageal reflux disease)    Hypertension    Hypothyroidism    Parkinson's disease    Radiation 04/2012   33 treatments for breast cancer   Right knee pain    Thyroid disease     Allergies  Allergen Reactions   Hydrocodone Other (See Comments)    Heart pain  Delusions   Tramadol Other (See Comments)    Hallucinations, abd pain Delusions (intolerance)     OBJECTIVE General Patient is awake, alert, and oriented x 3 and in no acute distress. Derm Skin is dry and supple bilateral. Negative open lesions or macerations. Remaining integument unremarkable. Nails are tender, long, thickened and dystrophic with subungual debris, consistent with onychomycosis, 1-5 bilateral. No signs of infection noted. Vasc  DP and PT pedal pulses palpable bilaterally. Temperature gradient within normal limits.  Neuro Epicritic and protective threshold sensation grossly intact bilaterally.  Musculoskeletal Exam No symptomatic pedal deformities noted bilateral. Muscular strength within normal limits.  There is also some tenderness to palpation along posterior tibial tendon of the right lower extremity  ASSESSMENT 1.  Pain due to onychomycosis of toenails both 2.  Posterior tibial tendinitis  right  PLAN OF CARE -Patient evaluated today.  -Instructed to maintain good pedal hygiene and foot care.  -Mechanical debridement of nails 1-5 bilaterally performed using a nail nipper. Filed with dremel without incident.  -Injection of 0.5 cc Celestone Soluspan injected along the posterior tibial tendon sheath right  -Return to clinic 6 months   Felecia Shelling, DPM [facility]  Dr. Felecia Shelling, DPM    [address] [place], [place] [address]                Office [phone]  Fax [phone]

## 2023-03-28 ENCOUNTER — Other Ambulatory Visit: Payer: Self-pay | Admitting: Physician Assistant

## 2023-04-15 DIAGNOSIS — H524 Presbyopia: Secondary | ICD-10-CM | POA: Diagnosis not present

## 2023-04-15 DIAGNOSIS — H5203 Hypermetropia, bilateral: Secondary | ICD-10-CM | POA: Diagnosis not present

## 2023-04-16 ENCOUNTER — Inpatient Hospital Stay: Payer: No Typology Code available for payment source

## 2023-04-18 ENCOUNTER — Inpatient Hospital Stay: Payer: No Typology Code available for payment source | Attending: Hematology

## 2023-04-18 DIAGNOSIS — Z853 Personal history of malignant neoplasm of breast: Secondary | ICD-10-CM | POA: Insufficient documentation

## 2023-04-18 DIAGNOSIS — I10 Essential (primary) hypertension: Secondary | ICD-10-CM | POA: Diagnosis not present

## 2023-04-18 DIAGNOSIS — Z87891 Personal history of nicotine dependence: Secondary | ICD-10-CM | POA: Diagnosis not present

## 2023-04-18 DIAGNOSIS — M8580 Other specified disorders of bone density and structure, unspecified site: Secondary | ICD-10-CM | POA: Diagnosis not present

## 2023-04-18 DIAGNOSIS — Z800 Family history of malignant neoplasm of digestive organs: Secondary | ICD-10-CM | POA: Insufficient documentation

## 2023-04-18 DIAGNOSIS — Z79811 Long term (current) use of aromatase inhibitors: Secondary | ICD-10-CM | POA: Diagnosis not present

## 2023-04-18 DIAGNOSIS — Z170 Estrogen receptor positive status [ER+]: Secondary | ICD-10-CM

## 2023-04-18 DIAGNOSIS — C50411 Malignant neoplasm of upper-outer quadrant of right female breast: Secondary | ICD-10-CM | POA: Diagnosis not present

## 2023-04-18 DIAGNOSIS — G20A1 Parkinson's disease without dyskinesia, without mention of fluctuations: Secondary | ICD-10-CM | POA: Insufficient documentation

## 2023-04-18 DIAGNOSIS — E559 Vitamin D deficiency, unspecified: Secondary | ICD-10-CM

## 2023-04-18 LAB — CBC WITH DIFFERENTIAL/PLATELET
Abs Immature Granulocytes: 0.01 10*3/uL (ref 0.00–0.07)
Basophils Absolute: 0 10*3/uL (ref 0.0–0.1)
Basophils Relative: 1 %
Eosinophils Absolute: 0.1 10*3/uL (ref 0.0–0.5)
Eosinophils Relative: 4 %
HCT: 43.8 % (ref 36.0–46.0)
Hemoglobin: 13.8 g/dL (ref 12.0–15.0)
Immature Granulocytes: 0 %
Lymphocytes Relative: 40 %
Lymphs Abs: 1.6 10*3/uL (ref 0.7–4.0)
MCH: 28.9 pg (ref 26.0–34.0)
MCHC: 31.5 g/dL (ref 30.0–36.0)
MCV: 91.8 fL (ref 80.0–100.0)
Monocytes Absolute: 0.3 10*3/uL (ref 0.1–1.0)
Monocytes Relative: 8 %
Neutro Abs: 1.9 10*3/uL (ref 1.7–7.7)
Neutrophils Relative %: 47 %
Platelets: 204 10*3/uL (ref 150–400)
RBC: 4.77 MIL/uL (ref 3.87–5.11)
RDW: 12.7 % (ref 11.5–15.5)
WBC: 3.9 10*3/uL — ABNORMAL LOW (ref 4.0–10.5)
nRBC: 0 % (ref 0.0–0.2)

## 2023-04-18 LAB — COMPREHENSIVE METABOLIC PANEL
ALT: 8 U/L (ref 0–44)
AST: 23 U/L (ref 15–41)
Albumin: 4.3 g/dL (ref 3.5–5.0)
Alkaline Phosphatase: 96 U/L (ref 38–126)
Anion gap: 10 (ref 5–15)
BUN: 12 mg/dL (ref 8–23)
CO2: 26 mmol/L (ref 22–32)
Calcium: 9.9 mg/dL (ref 8.9–10.3)
Chloride: 102 mmol/L (ref 98–111)
Creatinine, Ser: 0.78 mg/dL (ref 0.44–1.00)
GFR, Estimated: >60 mL/min (ref >60)
Glucose, Bld: 71 mg/dL (ref 70–99)
Potassium: 4 mmol/L (ref 3.5–5.1)
Sodium: 138 mmol/L (ref 135–145)
Total Bilirubin: 0.8 mg/dL (ref 0.3–1.2)
Total Protein: 8.3 g/dL — ABNORMAL HIGH (ref 6.5–8.1)

## 2023-04-18 LAB — VITAMIN D 25 HYDROXY (VIT D DEFICIENCY, FRACTURES): Vit D, 25-Hydroxy: 34.15 ng/mL (ref 30–100)

## 2023-04-22 NOTE — Progress Notes (Deleted)
[facility] [address], [place] [address]   CLINIC:  Medical Oncology/Hematology  PCP:  Estanislado Pandy, MD [address] [place] [address] [phone]   REASON FOR VISIT:  Follow-up for right breast cancer (2022) and left breast cancer (2013)   CURRENT THERAPY: Anastrozole   BRIEF ONCOLOGIC HISTORY:  Stage I right breast cancer: Diagnosed February 2022.  Right lumpectomy 11/03/2020.  Anastrozole started 12/05/2020. Stage I left breast cancer: Diagnosed July 2013, s/p lumpectomy and SLNB.  XRT completed November 2013.  Completed 5 years anastrozole.  CANCER STAGING:  Cancer Staging  Breast cancer of upper-outer quadrant of right female breast [facility]) Staging form: Breast, AJCC 8th Edition - Clinical stage from 10/09/2020: cT1c, cN0, cM0, G1, ER+, HER2- - Unsigned   INTERVAL HISTORY:   [name], a 77 y.o. female, returns for routine follow-up of her history of bilateral breast cancer (left breast 2013, right breast 2022). Marijane was last seen on 10/21/2022 by Rojelio Brenner PA-C.   At today's visit, she  reports feeling ***.  She denies any recent hospitalizations, surgeries, or changes in her  baseline health status.  She denies any symptoms of recurrence such as new lumps, bone pain, chest pain, or dyspnea.  *** *** She does have some intermittent abdominal pain associated with constipation.    *** She has no new headaches, seizures, or focal neurologic deficits. *** No B symptoms such as fever, chills, unintentional weight loss.  She continues to take letrozole daily, with hot flashes and night sweats once or twice a week.  *** *** She is taking calcium and vitamin D supplements daily.  She reports 40***% energy and 100***% appetite.  She is maintaining stable weight at this time.  ASSESSMENT & PLAN:  1.  Stage I (PT1CPNX) right breast upper outer quadrant IDC, ER/PR positive, HER2 negative  - Screening mammogram on  08/07/2020 BI-RADS Category 0. - Right breast mammogram and ultrasound on 09/01/2020 shows hypoechoic mass with irregular shape and borders at the 10 o'clock position of the right breast, 7 cm from the nipple measuring 0.8 x 1 x 1.3 cm.  Ultrasound of the right axilla was normal. - Biopsy of the right breast 10 o'clock position consistent with IDC, ER 95% positive, PR 90% positive, Ki-67 5%, HER2 2+ by IHC, negative by FISH. - Right lumpectomy on 11/03/2020 with 1.6 cm invasive ductal carcinoma, grade 1.  Resection margins negative.  Closest margin is 1 mm, superior margin.  PT1CPNX. - Letrozole started on 12/05/2020. - She is tolerating letrozole reasonably well with hot flashes and night sweats 1-2 times each week*** - Examination ***shows right breast upper outer quadrant lumpectomy scar is within normal limits with no palpable masses. - She reports a lump in the upper mid back. *** There is a subcutaneous mass with slip sign consistent with lipoma.*** - Labs reviewed by me (04/18/2023) shows normal LFTs and CBC. - Patient history/ROS negative for any "red flag" symptoms of recurrence*** - Most recent mammogram (10/01/2022) BI-RADS Category 2, benign.  No mammographic evidence of malignancy in either breast. - PLAN: Labs and office visit in 6 months. - Next mammogram due April 2025   2.  Stage I left breast cancer: - Diagnosed 01/07/2012, ER/PR positive, status post lumpectomy and SLNB. - XRT completed in November 2013. - Took 5 years of anastrozole.   3.  Osteopenia - DEXA scan on 11/20/2020 with T score -1.6, osteopenia. -  She is taking vitamin D 2 tablets daily - Vitamin D level normal 39.69 (10/01/2022)  - PLAN: She is overdue for bone density scan (canceled DEXA scan that was scheduled in August *** - Continue calcium, vitamin D, and weightbearing exercises for breast cancer therapy associated bone loss.   4.  Other history -PMH also significant for Parkinson's disease and hypertension -  She lives with husband at home and is independent of ADLs.  Uses a wheelchair for mobility but does sometimes walk with the help of a walker.  Denies any falls. - Quit smoking in 2000.  Smoked 3 to 4 cigarettes/day for many years. - Brother had pancreatic cancer.  Mother had colon cancer.  No family history of breast cancers.   PLAN SUMMARY: >> DEXA bone density scan overdue (please reschedule from canceled appointment) >> Bilateral screening mammogram due around 10/02/2022 >> Labs in 6 months = CBC/D, CMP, vitamin D >> OFFICE visit in 6 months ***   REVIEW OF SYSTEMS: ***  Review of Systems  Constitutional:  Positive for fatigue. Negative for appetite change, chills, diaphoresis, fever and unexpected weight change.  HENT:   Negative for lump/mass and nosebleeds.   Eyes:  Negative for eye problems.  Respiratory:  Negative for cough, hemoptysis and shortness of breath (With exertion, at baseline).   Cardiovascular:  Negative for chest pain, leg swelling and palpitations.  Gastrointestinal:  Positive for constipation. Negative for abdominal pain, blood in stool, diarrhea, nausea and vomiting.  Genitourinary:  Negative for hematuria.   Skin: Negative.   Neurological:  Positive for dizziness and numbness. Negative for headaches and light-headedness.  Hematological:  Does not bruise/bleed easily.    PHYSICAL EXAM:   Performance status (ECOG): 3 - Symptomatic, >50% confined to bed (presents in wheelchair) *** There were no vitals filed for this visit. Wt Readings from Last 3 Encounters:  11/09/20 165 lb (74.8 kg)  11/01/20 160 lb (72.6 kg)  10/19/20 166 lb (75.3 kg)   Physical Exam Constitutional:      Appearance: Normal appearance. She is obese.     Comments: Presents in wheelchair  HENT:     Head: Normocephalic and atraumatic.     Mouth/Throat:     Mouth: Mucous membranes are moist.  Eyes:     Extraocular Movements: Extraocular movements intact.     Pupils: Pupils are equal,  round, and reactive to light.  Cardiovascular:     Rate and Rhythm: Normal rate and regular rhythm.     Pulses: Normal pulses.     Heart sounds: Normal heart sounds.  Pulmonary:     Effort: Pulmonary effort is normal.     Breath sounds: Normal breath sounds.  Chest:     Comments: Lumpectomy scars within normal limits.  No discrete nodules or palpable masses within either breast.  No palpable lymphadenopathy supraclavicular, axillary, epitrochlear, or pectoral lymph nodes.   Abdominal:     General: Bowel sounds are normal.     Palpations: Abdomen is soft.     Tenderness: There is no abdominal tenderness.  Musculoskeletal:        General: No swelling.     Right lower leg: No edema.     Left lower leg: No edema.  Lymphadenopathy:     Cervical: No cervical adenopathy.  Skin:    General: Skin is warm and dry.  Neurological:     General: No focal deficit present.     Mental Status: She is alert and oriented to person, place,  and time.  Psychiatric:        Mood and Affect: Mood normal.        Behavior: Behavior normal.      PAST MEDICAL/SURGICAL HISTORY:  Past Medical History:  Diagnosis Date   Breast cancer (HCC) 11/2011   Chronic back pain    Chronic leg pain    bilateral   Chronic leukopenia    DDD (degenerative disc disease), cervical    DDD (degenerative disc disease), lumbar    Gait abnormality    GERD (gastroesophageal reflux disease)    Hypertension    Hypothyroidism    Parkinson's disease    Radiation 04/2012   33 treatments for breast cancer   Right knee pain    Thyroid disease    Past Surgical History:  Procedure Laterality Date   ABDOMINAL HYSTERECTOMY     partial   BREAST LUMPECTOMY Left 12/2011   CHOLECYSTECTOMY     COLONOSCOPY N/A 07/01/2013   Procedure: COLONOSCOPY;  Surgeon: Malissa Hippo, MD;  Location: [facility];  Service: Endoscopy;  Laterality: N/A;  1030   COLONOSCOPY N/A 09/15/2019   Procedure: COLONOSCOPY;  Surgeon: Malissa Hippo, MD;   Location: [facility];  Service: Endoscopy;  Laterality: N/A;  815   ESOPHAGOGASTRODUODENOSCOPY N/A 11/21/2017   Procedure: ESOPHAGOGASTRODUODENOSCOPY (EGD);  Surgeon: Malissa Hippo, MD;  Location: [facility];  Service: Endoscopy;  Laterality: N/A;  2:45   EXCISIONAL TOTAL KNEE ARTHROPLASTY WITH ANTIBIOTIC SPACERS Right 01/12/2018   Procedure: EXCISIONAL TOTAL KNEE ARTHROPLASTY WITH ANTIBIOTIC SPACERS;  Surgeon: Dannielle Huh, MD;  Location: [facility];  Service: Orthopedics;  Laterality: Right;   JOINT REPLACEMENT  01/2017   right knee   KNEE SURGERY Right 01/12/2018   EXCISIONAL TOTAL KNEE ARTHROPLASTY WITH ANTIBIOTIC SPACERS   MASTECTOMY, PARTIAL Right 11/03/2020   Procedure: RIGHT PARTIAL MASTECTOMY AFTER TAG PLACEMENT;  Surgeon: Franky Macho, MD;  Location: [facility];  Service: General;  Laterality: Right;   POLYPECTOMY  09/15/2019   Procedure: POLYPECTOMY;  Surgeon: Malissa Hippo, MD;  Location: [facility];  Service: Endoscopy;;  Cold Snare transverse colon polyp   TOTAL KNEE REVISION Right 03/23/2018   Procedure: RIGHT TOTAL KNEE REVISION;  Surgeon: Dannielle Huh, MD;  Location: [facility];  Service: Orthopedics;  Laterality: Right;   TUBAL LIGATION      SOCIAL HISTORY:  Social History   Socioeconomic History   Marital status: Widowed    Spouse name: Maisie Fus   Number of children: 3   Years of education: 12 th   Highest education level: Not on file  Occupational History    Comment: retired  Tobacco Use   Smoking status: Former    Current packs/day: 0.00    Average packs/day: 0.5 packs/day for 18.0 years (9.0 ttl pk-yrs)    Types: Cigarettes    Start date: 09/29/1980    Quit date: 09/30/1998    Years since quitting: 24.5   Smokeless tobacco: Never  Vaping Use   Vaping status: Never Used  Substance and Sexual Activity   Alcohol use: No   Drug use: No   Sexual activity: Not on file  Other Topics Concern   Not on file  Social History Narrative   Patient lives at home with her  husband Maisie Fus).    Retired    [employer] school education   Right handed   Social Determinants of Health   Financial Resource Strain: Low Risk  (10/05/2020)   Overall Financial Resource Strain (CARDIA)  Difficulty of Paying Living Expenses: Not very hard  Food Insecurity: No Food Insecurity (10/05/2020)   Hunger Vital Sign    Worried About Running Out of Food in the Last Year: Never true    Ran Out of Food in the Last Year: Never true  Transportation Needs: No Transportation Needs (10/05/2020)   PRAPARE - Administrator, Civil Service (Medical): No    Lack of Transportation (Non-Medical): No  Physical Activity: Inactive (10/05/2020)   Exercise Vital Sign    Days of Exercise per Week: 0 days    Minutes of Exercise per Session: 0 min  Stress: No Stress Concern Present (10/05/2020)   [employer] of Occupational Health - Occupational Stress Questionnaire    Feeling of Stress : Only a little  Social Connections: Socially Integrated (10/05/2020)   Social Connection and Isolation Panel [NHANES]    Frequency of Communication with Friends and Family: More than three times a week    Frequency of Social Gatherings with Friends and Family: More than three times a week    Attends Religious Services: More than 4 times per year    Active Member of [employer] or Organizations: No    Attends Engineer, structural: More than 4 times per year    Marital Status: Married  Catering manager Violence: Not At Risk (10/05/2020)   Humiliation, Afraid, Rape, and Kick questionnaire    Fear of Current or Ex-Partner: No    Emotionally Abused: No    Physically Abused: No    Sexually Abused: No    FAMILY HISTORY:  Family History  Problem Relation Age of Onset   Cancer Mother 44       colon cancer   Cancer Father 26       throat cancer   Pancreatic cancer Brother        [phone] 11-14-15    CURRENT MEDICATIONS:  Current Outpatient Medications  Medication Sig Dispense Refill   amLODipine  (NORVASC) 5 MG tablet Take 5 mg by mouth daily.      aspirin 325 MG EC tablet Take 1 tablet (325 mg total) by mouth 2 (two) times daily. 30 tablet 0   B Complex Vitamins (B COMPLEX PO) Place 1 tablet under the tongue daily.     baclofen (LIORESAL) 10 MG tablet Take 10 mg by mouth at bedtime.      calcium-vitamin D (OSCAL WITH D) 500-200 MG-UNIT tablet Take 1 tablet by mouth daily with breakfast. 90 tablet 1   carbidopa-levodopa (SINEMET IR) 25-100 MG tablet Take 2 tablets by mouth 3 (three) times daily. 540 tablet 3   Cyanocobalamin (VITAMIN B 12 PO) Take by mouth.     diazepam (VALIUM) 5 MG tablet Take 5 mg by mouth 2 (two) times daily.     ergocalciferol (VITAMIN D2) 1.25 MG (50000 UT) capsule Take by mouth.     gabapentin (NEURONTIN) 300 MG capsule Take 1 capsule (300 mg total) by mouth 3 (three) times daily. 90 capsule 11   letrozole (FEMARA) 2.5 MG tablet TAKE ONE TABLET (2.5MG  TOTAL) BY MOUTH DAILY 90 tablet 11   levothyroxine (SYNTHROID) 88 MCG tablet Take 88 mcg by mouth every morning.     MELATONIN PO Take by mouth.     pantoprazole (PROTONIX) 40 MG tablet Take 1 tablet (40 mg total) by mouth daily before breakfast. 90 tablet 3   polyethylene glycol (MIRALAX / GLYCOLAX) 17 g packet Take by mouth.     traZODone (DESYREL) 50 MG  tablet Take by mouth.     No current facility-administered medications for this visit.    ALLERGIES:  Allergies  Allergen Reactions   Hydrocodone Other (See Comments)    Heart pain  Delusions   Tramadol Other (See Comments)    Hallucinations, abd pain Delusions (intolerance)    LABORATORY DATA:  I have reviewed the labs as listed.     Latest Ref Rng & Units 04/18/2023   12:22 PM 10/01/2022   12:36 PM 04/16/2022    2:27 PM  CBC  WBC 4.0 - 10.5 K/uL 3.9  4.1  3.6   Hemoglobin 12.0 - 15.0 g/dL 40.9  81.1  91.4   Hematocrit 36.0 - 46.0 % 43.8  45.8  41.5   Platelets 150 - 400 K/uL 204  149  195       Latest Ref Rng & Units 04/18/2023   12:22 PM  10/01/2022   12:36 PM 04/16/2022    2:27 PM  CMP  Glucose 70 - 99 mg/dL 71  77  87   BUN 8 - 23 mg/dL 12  12  10    Creatinine 0.44 - 1.00 mg/dL 7.82  9.56  2.13   Sodium 135 - 145 mmol/L 138  138  138   Potassium 3.5 - 5.1 mmol/L 4.0  3.9  4.4   Chloride 98 - 111 mmol/L 102  103  104   CO2 22 - 32 mmol/L 26  25  27    Calcium 8.9 - 10.3 mg/dL 9.9  9.5  9.0   Total Protein 6.5 - 8.1 g/dL 8.3  8.9  7.8   Total Bilirubin 0.3 - 1.2 mg/dL 0.8  0.6  0.6   Alkaline Phos 38 - 126 U/L 96  104  98   AST 15 - 41 U/L 23  22  21    ALT 0 - 44 U/L 8  13  5      DIAGNOSTIC IMAGING:  I have independently reviewed the scans and discussed with the patient. No results found.   WRAP UP:  All questions were answered. The patient knows to call the clinic with any problems, questions or concerns.  Medical decision making: Moderate  Time spent on visit: I spent 20 minutes counseling the patient face to face. The total time spent in the appointment was 30 minutes and more than 50% was on counseling.  Carnella Guadalajara, PA-C  ***

## 2023-04-23 ENCOUNTER — Inpatient Hospital Stay: Payer: No Typology Code available for payment source | Admitting: Physician Assistant

## 2023-04-25 ENCOUNTER — Telehealth: Payer: Self-pay | Admitting: Neurology

## 2023-04-25 NOTE — Telephone Encounter (Signed)
Pt cancelled appointment due to transportation issues. Pt said will call back to reschedule.

## 2023-04-29 ENCOUNTER — Ambulatory Visit: Payer: No Typology Code available for payment source | Admitting: Neurology

## 2023-06-24 ENCOUNTER — Other Ambulatory Visit: Payer: Self-pay | Admitting: Neurology

## 2023-06-24 NOTE — Telephone Encounter (Signed)
Rx refilled. Appointment needed for further refills. 

## 2023-07-21 DIAGNOSIS — E7849 Other hyperlipidemia: Secondary | ICD-10-CM | POA: Diagnosis not present

## 2023-07-21 DIAGNOSIS — E782 Mixed hyperlipidemia: Secondary | ICD-10-CM | POA: Diagnosis not present

## 2023-07-21 DIAGNOSIS — K219 Gastro-esophageal reflux disease without esophagitis: Secondary | ICD-10-CM | POA: Diagnosis not present

## 2023-07-21 DIAGNOSIS — E876 Hypokalemia: Secondary | ICD-10-CM | POA: Diagnosis not present

## 2023-07-21 DIAGNOSIS — E039 Hypothyroidism, unspecified: Secondary | ICD-10-CM | POA: Diagnosis not present

## 2023-07-29 DIAGNOSIS — E039 Hypothyroidism, unspecified: Secondary | ICD-10-CM | POA: Diagnosis not present

## 2023-07-29 DIAGNOSIS — E6609 Other obesity due to excess calories: Secondary | ICD-10-CM | POA: Diagnosis not present

## 2023-07-29 DIAGNOSIS — E782 Mixed hyperlipidemia: Secondary | ICD-10-CM | POA: Diagnosis not present

## 2023-07-29 DIAGNOSIS — K219 Gastro-esophageal reflux disease without esophagitis: Secondary | ICD-10-CM | POA: Diagnosis not present

## 2023-07-30 DIAGNOSIS — R350 Frequency of micturition: Secondary | ICD-10-CM | POA: Diagnosis not present

## 2023-08-05 DIAGNOSIS — B351 Tinea unguium: Secondary | ICD-10-CM | POA: Diagnosis not present

## 2023-08-25 ENCOUNTER — Other Ambulatory Visit (HOSPITAL_COMMUNITY): Payer: Self-pay | Admitting: Hematology

## 2023-08-25 DIAGNOSIS — Z1231 Encounter for screening mammogram for malignant neoplasm of breast: Secondary | ICD-10-CM

## 2023-09-15 ENCOUNTER — Ambulatory Visit: Payer: No Typology Code available for payment source | Admitting: Podiatry

## 2023-10-03 ENCOUNTER — Ambulatory Visit (HOSPITAL_COMMUNITY)
Admission: RE | Admit: 2023-10-03 | Discharge: 2023-10-03 | Disposition: A | Discharge status: Home / Self Care | Source: Ambulatory Visit | Attending: Hematology | Admitting: Hematology

## 2023-10-03 ENCOUNTER — Ambulatory Visit (HOSPITAL_COMMUNITY)
Admission: RE | Admit: 2023-10-03 | Discharge: 2023-10-03 | Disposition: A | Discharge status: Home / Self Care | Source: Ambulatory Visit | Attending: Physician Assistant | Admitting: Physician Assistant

## 2023-10-03 DIAGNOSIS — M8589 Other specified disorders of bone density and structure, multiple sites: Secondary | ICD-10-CM | POA: Diagnosis not present

## 2023-10-03 DIAGNOSIS — M8580 Other specified disorders of bone density and structure, unspecified site: Secondary | ICD-10-CM

## 2023-10-03 DIAGNOSIS — Z780 Asymptomatic menopausal state: Secondary | ICD-10-CM | POA: Insufficient documentation

## 2023-10-03 DIAGNOSIS — M8588 Other specified disorders of bone density and structure, other site: Secondary | ICD-10-CM | POA: Diagnosis not present

## 2023-10-03 DIAGNOSIS — Z1231 Encounter for screening mammogram for malignant neoplasm of breast: Secondary | ICD-10-CM | POA: Insufficient documentation

## 2023-10-03 DIAGNOSIS — Z13820 Encounter for screening for osteoporosis: Secondary | ICD-10-CM | POA: Diagnosis not present

## 2023-10-16 DIAGNOSIS — B351 Tinea unguium: Secondary | ICD-10-CM | POA: Diagnosis not present

## 2023-11-13 ENCOUNTER — Other Ambulatory Visit: Payer: Self-pay

## 2023-11-13 DIAGNOSIS — C50411 Malignant neoplasm of upper-outer quadrant of right female breast: Secondary | ICD-10-CM

## 2023-11-13 DIAGNOSIS — E559 Vitamin D deficiency, unspecified: Secondary | ICD-10-CM

## 2023-11-14 ENCOUNTER — Inpatient Hospital Stay

## 2023-11-17 ENCOUNTER — Inpatient Hospital Stay: Attending: Hematology

## 2023-11-17 DIAGNOSIS — K5900 Constipation, unspecified: Secondary | ICD-10-CM | POA: Diagnosis not present

## 2023-11-17 DIAGNOSIS — Z87891 Personal history of nicotine dependence: Secondary | ICD-10-CM | POA: Diagnosis not present

## 2023-11-17 DIAGNOSIS — R3915 Urgency of urination: Secondary | ICD-10-CM | POA: Diagnosis not present

## 2023-11-17 DIAGNOSIS — Z853 Personal history of malignant neoplasm of breast: Secondary | ICD-10-CM | POA: Diagnosis not present

## 2023-11-17 DIAGNOSIS — M8580 Other specified disorders of bone density and structure, unspecified site: Secondary | ICD-10-CM | POA: Diagnosis not present

## 2023-11-17 DIAGNOSIS — Z79811 Long term (current) use of aromatase inhibitors: Secondary | ICD-10-CM | POA: Diagnosis not present

## 2023-11-17 DIAGNOSIS — G20A1 Parkinson's disease without dyskinesia, without mention of fluctuations: Secondary | ICD-10-CM | POA: Diagnosis not present

## 2023-11-17 DIAGNOSIS — G479 Sleep disorder, unspecified: Secondary | ICD-10-CM | POA: Insufficient documentation

## 2023-11-17 DIAGNOSIS — I10 Essential (primary) hypertension: Secondary | ICD-10-CM | POA: Insufficient documentation

## 2023-11-17 DIAGNOSIS — R059 Cough, unspecified: Secondary | ICD-10-CM | POA: Diagnosis not present

## 2023-11-17 DIAGNOSIS — R5383 Other fatigue: Secondary | ICD-10-CM | POA: Diagnosis not present

## 2023-11-17 DIAGNOSIS — Z170 Estrogen receptor positive status [ER+]: Secondary | ICD-10-CM

## 2023-11-17 DIAGNOSIS — Z800 Family history of malignant neoplasm of digestive organs: Secondary | ICD-10-CM | POA: Insufficient documentation

## 2023-11-17 DIAGNOSIS — E559 Vitamin D deficiency, unspecified: Secondary | ICD-10-CM

## 2023-11-17 DIAGNOSIS — R109 Unspecified abdominal pain: Secondary | ICD-10-CM | POA: Insufficient documentation

## 2023-11-17 DIAGNOSIS — C50411 Malignant neoplasm of upper-outer quadrant of right female breast: Secondary | ICD-10-CM | POA: Diagnosis not present

## 2023-11-17 LAB — VITAMIN D 25 HYDROXY (VIT D DEFICIENCY, FRACTURES): Vit D, 25-Hydroxy: 34.16 ng/mL (ref 30–100)

## 2023-11-17 LAB — CBC WITH DIFFERENTIAL/PLATELET
Abs Immature Granulocytes: 0.02 10*3/uL (ref 0.00–0.07)
Basophils Absolute: 0 10*3/uL (ref 0.0–0.1)
Basophils Relative: 0 %
Eosinophils Absolute: 0.1 10*3/uL (ref 0.0–0.5)
Eosinophils Relative: 3 %
HCT: 40.6 % (ref 36.0–46.0)
Hemoglobin: 13.2 g/dL (ref 12.0–15.0)
Immature Granulocytes: 0 %
Lymphocytes Relative: 30 %
Lymphs Abs: 1.4 10*3/uL (ref 0.7–4.0)
MCH: 29.2 pg (ref 26.0–34.0)
MCHC: 32.5 g/dL (ref 30.0–36.0)
MCV: 89.8 fL (ref 80.0–100.0)
Monocytes Absolute: 0.3 10*3/uL (ref 0.1–1.0)
Monocytes Relative: 7 %
Neutro Abs: 2.8 10*3/uL (ref 1.7–7.7)
Neutrophils Relative %: 60 %
Platelets: UNDETERMINED 10*3/uL (ref 150–400)
RBC: 4.52 MIL/uL (ref 3.87–5.11)
RDW: 12.8 % (ref 11.5–15.5)
Smear Review: UNDETERMINED
WBC: 4.6 10*3/uL (ref 4.0–10.5)
nRBC: 0 % (ref 0.0–0.2)

## 2023-11-17 LAB — COMPREHENSIVE METABOLIC PANEL WITH GFR
ALT: 15 U/L (ref 0–44)
AST: 19 U/L (ref 15–41)
Albumin: 4.4 g/dL (ref 3.5–5.0)
Alkaline Phosphatase: 103 U/L (ref 38–126)
Anion gap: 11 (ref 5–15)
BUN: 10 mg/dL (ref 8–23)
CO2: 23 mmol/L (ref 22–32)
Calcium: 9.6 mg/dL (ref 8.9–10.3)
Chloride: 105 mmol/L (ref 98–111)
Creatinine, Ser: 0.72 mg/dL (ref 0.44–1.00)
GFR, Estimated: >60 mL/min (ref >60)
Glucose, Bld: 97 mg/dL (ref 70–99)
Potassium: 3.7 mmol/L (ref 3.5–5.1)
Sodium: 139 mmol/L (ref 135–145)
Total Bilirubin: 0.7 mg/dL (ref 0.0–1.2)
Total Protein: 8.4 g/dL — ABNORMAL HIGH (ref 6.5–8.1)

## 2023-11-21 ENCOUNTER — Inpatient Hospital Stay: Admitting: Oncology

## 2023-12-05 ENCOUNTER — Inpatient Hospital Stay: Admitting: Oncology

## 2023-12-05 VITALS — BP 172/80 | HR 63 | Temp 98.3°F | Resp 19

## 2023-12-05 DIAGNOSIS — Z170 Estrogen receptor positive status [ER+]: Secondary | ICD-10-CM | POA: Diagnosis not present

## 2023-12-05 DIAGNOSIS — C50411 Malignant neoplasm of upper-outer quadrant of right female breast: Secondary | ICD-10-CM | POA: Diagnosis not present

## 2023-12-05 DIAGNOSIS — M8580 Other specified disorders of bone density and structure, unspecified site: Secondary | ICD-10-CM

## 2023-12-05 DIAGNOSIS — C50012 Malignant neoplasm of nipple and areola, left female breast: Secondary | ICD-10-CM | POA: Diagnosis not present

## 2023-12-05 DIAGNOSIS — E559 Vitamin D deficiency, unspecified: Secondary | ICD-10-CM

## 2023-12-05 DIAGNOSIS — R3915 Urgency of urination: Secondary | ICD-10-CM | POA: Diagnosis not present

## 2023-12-05 LAB — URINALYSIS, ROUTINE W REFLEX MICROSCOPIC
Bilirubin Urine: NEGATIVE
Glucose, UA: NEGATIVE mg/dL
Hgb urine dipstick: NEGATIVE
Ketones, ur: NEGATIVE mg/dL
Nitrite: NEGATIVE
Protein, ur: NEGATIVE mg/dL
Specific Gravity, Urine: 1.01 (ref 1.005–1.030)
pH: 8 (ref 5.0–8.0)

## 2023-12-05 MED ORDER — LETROZOLE 2.5 MG PO TABS: 2.5000 mg | ORAL_TABLET | Freq: Every day | ORAL | 11 refills | Status: DC

## 2023-12-05 NOTE — Progress Notes (Signed)
 [facility] [address] [place], [place] [address]   CLINIC:  Medical Oncology/Hematology  PCP:  Orest Bio, MD [address] [place] [address] [phone]   REASON FOR VISIT:  Follow-up for right breast cancer (2022) and left breast cancer (2013)   CURRENT THERAPY: Anastrozole    BRIEF ONCOLOGIC HISTORY:  Stage I right breast cancer: Diagnosed February 2022.  Right lumpectomy 11/03/2020.  Anastrozole  started 12/05/2020. Stage I left breast cancer: Diagnosed July 2013, s/p lumpectomy and SLNB.  XRT completed November 2013.  Completed 5 years anastrozole .  CANCER STAGING:  Cancer Staging  Breast cancer of upper-outer quadrant of right female breast [facility]) Staging form: Breast, AJCC 8th Edition - Clinical stage from 10/09/2020: cT1c, cN0, cM0, G1, ER+, HER2- - Unsigned   INTERVAL HISTORY:   [name], a 78 y.o. female, returns for routine follow-up of her history of bilateral breast cancer (left breast 2013, right breast 2022). [name] was last seen on 10/23/2022 by Sheril Dines PA-C.   In the interim, she has had a bone density scan and and a mammogram.  She is here to review her lab work and imaging.  Reports overall doing well since her last visit.  She denies any hospitalizations, surgeries or changes in her baseline health.  She reports she does not think that she is taking Femara  and reports only taking her blood pressure medicine and thyroid  medicine.  Reports she took anastrozole  for 5 years and does not remember starting Femara .  Reports 2 to 3 months worth of increased urinary urgency.  She denies any dysuria or hematuria.  She has not had a urinalysis or urine culture drawn.  She denies any symptoms of recurrence such as new lumps, bone pain, chest pain, or dyspnea.  She does have some intermittent abdominal pain associated with constipation.   She has no new headaches, seizures, or focal neurologic deficits.  No B symptoms  such as fever, chills, unintentional weight loss.  She is taking calcium  and vitamin D  supplements daily.  She reports 50% energy and 50% appetite.  She is maintaining stable weight at this time.  She is here today with a family member.   ASSESSMENT & PLAN:  1.  Stage I (PT1CPNX) right breast upper outer quadrant IDC, ER/PR positive, HER2 negative  - Screening mammogram on 08/07/2020 BI-RADS Category 0. - Right breast mammogram and ultrasound on 09/01/2020 shows hypoechoic mass with irregular shape and borders at the 10 o'clock position of the right breast, 7 cm from the nipple measuring 0.8 x 1 x 1.3 cm.  Ultrasound of the right axilla was normal. - Biopsy of the right breast 10 o'clock position consistent with IDC, ER 95% positive, PR 90% positive, Ki-67 5%, HER2 2+ by IHC, negative by FISH. - Right lumpectomy on 11/03/2020 with 1.6 cm invasive ductal carcinoma, grade 1.  Resection margins negative.  Closest margin is 1 mm, superior margin.  PT1CPNX. - Letrozole  started on 12/05/2020.   2.  Stage I left breast cancer: - Diagnosed 01/07/2012, ER/PR positive, status post lumpectomy and SLNB. - XRT completed in November 2013. - Took 5 years of anastrozole .   3.  Osteopenia - DEXA scan on 11/20/2020 with T score -1.6, osteopenia. - She is taking vitamin D  2 tablets daily   4.  Other history -PMH also significant for Parkinson's disease and hypertension - She lives with husband at home and is independent of ADLs.  Uses a  wheelchair for mobility but does sometimes walk with the help of a walker.  Denies any falls. - Quit smoking in 2000.  Smoked 3 to 4 cigarettes/day for many years. - Brother had pancreatic cancer.  Mother had colon cancer.  No family history of breast cancers.  PLAN:  1. Malignant neoplasm of upper-outer quadrant of right breast in female, estrogen receptor positive (HCC) (Primary) - She was tolerating letrozole  reasonably well with hot flashes and night sweats 1-2 times each  week at her last visit although she can now not remember if she is taking it. - I will resend a prescription to her mail order pharmacy and she will start back on it in the next week or so. - Labs reviewed by me (12/05/23) shows normal LFTs and CBC. - Patient history/ROS negative for any red flag symptoms of recurrence - Most recent mammogram (10/03/23) BI-RADS Category 1, negative.  No mammographic evidence of malignancy in either breast. - Labs and office visit in 6 months. - Next mammogram due April 2026.   2. Malignant neoplasm of nipple of left breast in female, estrogen receptor positive (HCC) -No evidence of recurrent disease. -Will continue to monitor.  3. Osteopenia, unspecified location - Vitamin D  level normal 34.16 (11/17/23).  - Bone scan from 10/03/2023 revealed a T-score of -1.6 with a 10-year probability of of major osteoporotic fracture 4.7% specifically hip fracture 0.8%.  No significant change from previous. - Continue calcium , vitamin D , and weightbearing exercises for breast cancer therapy associated bone loss.  4.  Increased urinary urgency -Discussed urinalysis and urine culture today.  Unable to collect in clinic.  Will send home with her and they will return at their earliest convenience. -Will call with results.   PLAN SUMMARY: >> Repeat bone density every 2 years.  Next due in April of 2027.  >> Labs in 6 months = CBC/D, CMP, vitamin D  >> Send urine culture today. >> Repeat mammogram in April 2026. >> OFFICE visit in 6 months    REVIEW OF SYSTEMS:   Review of Systems  Constitutional:  Positive for fatigue.  Respiratory:  Positive for cough.   Gastrointestinal:  Positive for constipation.  Genitourinary:  Positive for frequency.   Musculoskeletal:  Positive for arthralgias.  Neurological:  Positive for dizziness and numbness.  Psychiatric/Behavioral:  Positive for sleep disturbance.     PHYSICAL EXAM:   Performance status (ECOG): 3 - Symptomatic, >50%  confined to bed (presents in wheelchair)  There were no vitals filed for this visit. Wt Readings from Last 3 Encounters:  11/09/20 165 lb (74.8 kg)  11/01/20 160 lb (72.6 kg)  10/19/20 166 lb (75.3 kg)   Physical Exam Constitutional:      Appearance: Normal appearance.     Comments: In wheelchair.   Cardiovascular:     Rate and Rhythm: Normal rate and regular rhythm.  Pulmonary:     Effort: Pulmonary effort is normal.     Breath sounds: Normal breath sounds.  Abdominal:     General: Bowel sounds are normal.     Palpations: Abdomen is soft.   Musculoskeletal:        General: No swelling. Normal range of motion.   Neurological:     Mental Status: She is alert and oriented to person, place, and time. Mental status is at baseline.      PAST MEDICAL/SURGICAL HISTORY:  Past Medical History:  Diagnosis Date   Breast cancer (HCC) 11/2011   Chronic back pain  Chronic leg pain    bilateral   Chronic leukopenia    DDD (degenerative disc disease), cervical    DDD (degenerative disc disease), lumbar    Gait abnormality    GERD (gastroesophageal reflux disease)    Hypertension    Hypothyroidism    Parkinson's disease    Radiation 04/2012   33 treatments for breast cancer   Right knee pain    Thyroid  disease    Past Surgical History:  Procedure Laterality Date   ABDOMINAL HYSTERECTOMY     partial   BREAST LUMPECTOMY Left 12/2011   CHOLECYSTECTOMY     COLONOSCOPY N/A 07/01/2013   Procedure: COLONOSCOPY;  Surgeon: Ruby Corporal, MD;  Location: [facility];  Service: Endoscopy;  Laterality: N/A;  1030   COLONOSCOPY N/A 09/15/2019   Procedure: COLONOSCOPY;  Surgeon: Ruby Corporal, MD;  Location: [facility];  Service: Endoscopy;  Laterality: N/A;  815   ESOPHAGOGASTRODUODENOSCOPY N/A 11/21/2017   Procedure: ESOPHAGOGASTRODUODENOSCOPY (EGD);  Surgeon: Ruby Corporal, MD;  Location: [facility];  Service: Endoscopy;  Laterality: N/A;  2:45   EXCISIONAL TOTAL KNEE  ARTHROPLASTY WITH ANTIBIOTIC SPACERS Right 01/12/2018   Procedure: EXCISIONAL TOTAL KNEE ARTHROPLASTY WITH ANTIBIOTIC SPACERS;  Surgeon: Christie Cox, MD;  Location: [facility];  Service: Orthopedics;  Laterality: Right;   JOINT REPLACEMENT  01/2017   right knee   KNEE SURGERY Right 01/12/2018   EXCISIONAL TOTAL KNEE ARTHROPLASTY WITH ANTIBIOTIC SPACERS   MASTECTOMY, PARTIAL Right 11/03/2020   Procedure: RIGHT PARTIAL MASTECTOMY AFTER TAG PLACEMENT;  Surgeon: Alanda Allegra, MD;  Location: [facility];  Service: General;  Laterality: Right;   POLYPECTOMY  09/15/2019   Procedure: POLYPECTOMY;  Surgeon: Ruby Corporal, MD;  Location: [facility];  Service: Endoscopy;;  Cold Snare transverse colon polyp   TOTAL KNEE REVISION Right 03/23/2018   Procedure: RIGHT TOTAL KNEE REVISION;  Surgeon: Christie Cox, MD;  Location: [facility];  Service: Orthopedics;  Laterality: Right;   TUBAL LIGATION      SOCIAL HISTORY:  Social History   Socioeconomic History   Marital status: Widowed    Spouse name: Andy Bannister   Number of children: 3   Years of education: 12 th   Highest education level: Not on file  Occupational History    Comment: retired  Tobacco Use   Smoking status: Former    Current packs/day: 0.00    Average packs/day: 0.5 packs/day for 18.0 years (9.0 ttl pk-yrs)    Types: Cigarettes    Start date: 09/29/1980    Quit date: 09/30/1998    Years since quitting: 25.1   Smokeless tobacco: Never  Vaping Use   Vaping status: Never Used  Substance and Sexual Activity   Alcohol  use: No   Drug use: No   Sexual activity: Not on file  Other Topics Concern   Not on file  Social History Narrative   Patient lives at home with her husband Andy Bannister).    Retired    [employer] school education   Right handed   Social Drivers of Health   Financial Resource Strain: Low Risk  (10/05/2020)   Overall Financial Resource Strain (CARDIA)    Difficulty of Paying Living Expenses: Not very hard  Food Insecurity: No Food  Insecurity (10/05/2020)   Hunger Vital Sign    Worried About Running Out of Food in the Last Year: Never true    Ran Out of Food in the Last Year: Never true  Transportation Needs: No Transportation  Needs (10/05/2020)   PRAPARE - Administrator, Civil Service (Medical): No    Lack of Transportation (Non-Medical): No  Physical Activity: Inactive (10/05/2020)   Exercise Vital Sign    Days of Exercise per Week: 0 days    Minutes of Exercise per Session: 0 min  Stress: No Stress Concern Present (10/05/2020)   [employer] of Occupational Health - Occupational Stress Questionnaire    Feeling of Stress : Only a little  Social Connections: Socially Integrated (10/05/2020)   Social Connection and Isolation Panel    Frequency of Communication with Friends and Family: More than three times a week    Frequency of Social Gatherings with Friends and Family: More than three times a week    Attends Religious Services: More than 4 times per year    Active Member of [employer] or Organizations: No    Attends Engineer, structural: More than 4 times per year    Marital Status: Married  Catering manager Violence: Not At Risk (10/05/2020)   Humiliation, Afraid, Rape, and Kick questionnaire    Fear of Current or Ex-Partner: No    Emotionally Abused: No    Physically Abused: No    Sexually Abused: No    FAMILY HISTORY:  Family History  Problem Relation Age of Onset   Cancer Mother 70       colon cancer   Cancer Father 55       throat cancer   Pancreatic cancer Brother        [phone] 11-14-15    CURRENT MEDICATIONS:  Current Outpatient Medications  Medication Sig Dispense Refill   amLODipine  (NORVASC ) 5 MG tablet Take 5 mg by mouth daily.      aspirin  325 MG EC tablet Take 1 tablet (325 mg total) by mouth 2 (two) times daily. 30 tablet 0   B Complex Vitamins (B COMPLEX PO) Place 1 tablet under the tongue daily.     baclofen (LIORESAL) 10 MG tablet Take 10 mg by mouth at bedtime.       calcium -vitamin D  (OSCAL WITH D) 500-200 MG-UNIT tablet Take 1 tablet by mouth daily with breakfast. 90 tablet 1   carbidopa -levodopa  (SINEMET  IR) 25-100 MG tablet Take 2 tablets by mouth 3 (three) times daily. 540 tablet 3   Cyanocobalamin (VITAMIN B 12 PO) Take by mouth.     diazepam (VALIUM) 5 MG tablet Take 5 mg by mouth 2 (two) times daily.     ergocalciferol  (VITAMIN D2) 1.25 MG (50000 UT) capsule Take by mouth.     gabapentin  (NEURONTIN ) 300 MG capsule TAKE ONE CAPSULE BY MOUTH THREE TIMES DAILY @ 9AM-1PM-5PM 90 capsule 3   letrozole  (FEMARA ) 2.5 MG tablet TAKE ONE TABLET (2.5MG  TOTAL) BY MOUTH DAILY 90 tablet 11   levothyroxine  (SYNTHROID ) 88 MCG tablet Take 88 mcg by mouth every morning.     MELATONIN PO Take by mouth.     pantoprazole  (PROTONIX ) 40 MG tablet Take 1 tablet (40 mg total) by mouth daily before breakfast. 90 tablet 3   polyethylene glycol (MIRALAX  / GLYCOLAX ) 17 g packet Take by mouth.     traZODone  (DESYREL ) 50 MG tablet Take by mouth.     No current facility-administered medications for this visit.    ALLERGIES:  Allergies  Allergen Reactions   Hydrocodone  Other (See Comments)    Heart pain  Delusions   Tramadol  Other (See Comments)    Hallucinations, abd pain Delusions (intolerance)    LABORATORY DATA:  I have reviewed the labs as listed.     Latest Ref Rng & Units 11/17/2023   12:55 PM 04/18/2023   12:22 PM 10/01/2022   12:36 PM  CBC  WBC 4.0 - 10.5 K/uL 4.6  3.9  4.1   Hemoglobin 12.0 - 15.0 g/dL 16.1  09.6  04.5   Hematocrit 36.0 - 46.0 % 40.6  43.8  45.8   Platelets 150 - 400 K/uL PLATELET CLUMPS NOTED ON SMEAR, UNABLE TO ESTIMATE  204  149       Latest Ref Rng & Units 11/17/2023   12:55 PM 04/18/2023   12:22 PM 10/01/2022   12:36 PM  CMP  Glucose 70 - 99 mg/dL 97  71  77   BUN 8 - 23 mg/dL 10  12  12    Creatinine 0.44 - 1.00 mg/dL 4.09  8.11  9.14   Sodium 135 - 145 mmol/L 139  138  138   Potassium 3.5 - 5.1 mmol/L 3.7  4.0  3.9   Chloride  98 - 111 mmol/L 105  102  103   CO2 22 - 32 mmol/L 23  26  25    Calcium  8.9 - 10.3 mg/dL 9.6  9.9  9.5   Total Protein 6.5 - 8.1 g/dL 8.4  8.3  8.9   Total Bilirubin 0.0 - 1.2 mg/dL 0.7  0.8  0.6   Alkaline Phos 38 - 126 U/L 103  96  104   AST 15 - 41 U/L 19  23  22    ALT 0 - 44 U/L 15  8  13      DIAGNOSTIC IMAGING:  I have independently reviewed the scans and discussed with the patient. No results found.   WRAP UP:  All questions were answered. The patient knows to call the clinic with any problems, questions or concerns.  Medical decision making: Moderate  Time spent on visit: I spent 20 minutes counseling the patient face to face. The total time spent in the appointment was 30 minutes and more than 50% was on counseling.  Aurther Blue, NP  10/21/2022 2:37 PM

## 2023-12-07 LAB — URINE CULTURE: Culture: 70000 — AB

## 2023-12-08 ENCOUNTER — Other Ambulatory Visit: Payer: Self-pay | Admitting: Oncology

## 2023-12-08 ENCOUNTER — Ambulatory Visit: Payer: Self-pay | Admitting: Oncology

## 2023-12-08 MED ORDER — SULFAMETHOXAZOLE-TRIMETHOPRIM 800-160 MG PO TABS: 1.0000 | ORAL_TABLET | Freq: Two times a day (BID) | ORAL | 0 refills | Status: AC

## 2023-12-08 NOTE — Progress Notes (Signed)
 Hey Tomi,   This patient needs to be called and told that being have a UTI and need to pick up antibiotics.  I think you are on vacation this week.  Amy do know who is covering for her?  I sent it to the [facility] in [place].  Delon Hope, NP 12/08/2023 9:36 AM

## 2023-12-25 DIAGNOSIS — B351 Tinea unguium: Secondary | ICD-10-CM | POA: Diagnosis not present

## 2023-12-31 ENCOUNTER — Other Ambulatory Visit: Payer: Self-pay | Admitting: Neurology

## 2023-12-31 NOTE — Telephone Encounter (Signed)
 Phone room: Please call and tell pt in order to refill they must make and keep appt

## 2023-12-31 NOTE — Telephone Encounter (Signed)
 Lisa Torres  [facility] called to request Pt medication refill  carbidopa -levodopa  (SINEMET  IR) 25-100 MG table   Medication is to be sent to   [facility] [place] - [address] (Ph: [phone]

## 2024-01-12 MED ORDER — CARBIDOPA-LEVODOPA 25-100 MG PO TABS: 2.0000 | ORAL_TABLET | Freq: Three times a day (TID) | ORAL | 0 refills | Status: DC

## 2024-01-12 NOTE — Telephone Encounter (Signed)
 Pt called to  request  appt  . offered pT  8-4  with Lauraine for follow up appt .  Pt states that day will not work . The schedule is far out  schedule  is far out  .Pt states she need medication . PT  is requesting Thursday or Friday.

## 2024-01-12 NOTE — Telephone Encounter (Signed)
 Called Pt back informed that medication is only filled for month . Pt was alright with that , Schedule Pt follow up appt  Pt states she will try and make appt .Informed Pt that she must make appt so she can receive medication . Pt states she will be here

## 2024-01-12 NOTE — Telephone Encounter (Signed)
 Phone room: Tell pt I gave 1 month fill but this will be the last fill until appt gets scheduled. Also, if appt gets scheduled she will only get enough to appt and has to make and keep appt

## 2024-01-12 NOTE — Telephone Encounter (Signed)
 Called and LVM for pt to call back and schedule appt.

## 2024-01-27 DIAGNOSIS — E7849 Other hyperlipidemia: Secondary | ICD-10-CM | POA: Diagnosis not present

## 2024-01-27 DIAGNOSIS — Z9189 Other specified personal risk factors, not elsewhere classified: Secondary | ICD-10-CM | POA: Diagnosis not present

## 2024-01-27 DIAGNOSIS — E782 Mixed hyperlipidemia: Secondary | ICD-10-CM | POA: Diagnosis not present

## 2024-01-27 DIAGNOSIS — Z0001 Encounter for general adult medical examination with abnormal findings: Secondary | ICD-10-CM | POA: Diagnosis not present

## 2024-01-27 DIAGNOSIS — Z1389 Encounter for screening for other disorder: Secondary | ICD-10-CM | POA: Diagnosis not present

## 2024-01-27 DIAGNOSIS — Z23 Encounter for immunization: Secondary | ICD-10-CM | POA: Diagnosis not present

## 2024-01-27 DIAGNOSIS — Z1331 Encounter for screening for depression: Secondary | ICD-10-CM | POA: Diagnosis not present

## 2024-01-27 DIAGNOSIS — Z6839 Body mass index (BMI) 39.0-39.9, adult: Secondary | ICD-10-CM | POA: Diagnosis not present

## 2024-01-27 DIAGNOSIS — I10 Essential (primary) hypertension: Secondary | ICD-10-CM | POA: Diagnosis not present

## 2024-01-27 DIAGNOSIS — E039 Hypothyroidism, unspecified: Secondary | ICD-10-CM | POA: Diagnosis not present

## 2024-01-27 DIAGNOSIS — K219 Gastro-esophageal reflux disease without esophagitis: Secondary | ICD-10-CM | POA: Diagnosis not present

## 2024-02-11 ENCOUNTER — Telehealth: Payer: Self-pay | Admitting: Neurology

## 2024-02-11 NOTE — Telephone Encounter (Signed)
 Lisa Torres from [facility] called wanting to clarify dosage on Pt's carbidopa -levodopa  (SINEMET  IR) 25-100 MG tablet She was informed of the change/decrease to Take 2 tablets by mouth 3 (three) times daily per last visit on 10/23/22. Lisa Torres stated that her pill packs are still showing 3 pills 3x a day. Lisa Torres is wanting to know if a new Rx with the new instructions can be faxed over to [facility]. Please advise.

## 2024-02-11 NOTE — Telephone Encounter (Signed)
 Called and stated to cancel simet as pt overdue to be seen and that tomorrow pt has appt and we can get thatrx sent them with updated dosage. They voiced gratitude and understanding

## 2024-02-12 ENCOUNTER — Ambulatory Visit: Admitting: Neurology

## 2024-02-12 ENCOUNTER — Encounter: Payer: Self-pay | Admitting: Neurology

## 2024-02-12 NOTE — Progress Notes (Deleted)
 No chief complaint on file.     ASSESSMENT AND PLAN  Lisa Torres is a 78 y.o. female   Parkinson disease with dyskinesia,  Will decrease Sinemet  to 25/100 mg 2 tablets 3 times a day from 3 tablets 3 times a day, Gait abnormality Fixed contraction of bilateral knee, history of failed right knee replacement, and right knee revision, wheelchair-bound since 2019 Significant knee pain upon passive stretch, limited range of motion, has lost follow-up with orthopedic surgeon, will refer back to Dr. Melodi   Her gait abnormality multifactorial fixed contraction of bilateral knee, knee pain, parkinsonian syndrome all contributed to,  MRI of the brain is pending  DIAGNOSTIC DATA (LABS, IMAGING, TESTING) - I reviewed patient records, labs, notes, testing and imaging myself where available.   MEDICAL HISTORY:  Lisa Torres, is a 78 year old female, seen in request by her primary care physician from [facility] Dr. Deward Soja for evaluation of gait abnormality, left foot pain, she is accompanied by her son at today's visit July 22, 2022  I reviewed and summarized the referring note. PMHX HTN Hypothyroidism History of breast cancer, s/p double mastectomy, s/p radiation therapy. Right knee replacement in 2018, revision in 2019,  I saw her previously in 2016, at that time she was seen for right hand tremor started around 2014, right shoulder achiness, difficulty initiate steps, gait change, her exam are consistent with Parkinson's disease, she was treated with Mirapex , reported did help her symptoms, at that time MRI of the brain, laboratory evaluation showed no significant abnormalities  Later evaluation also demonstrated MRI of lumbar, thoracic spine degenerative changes but no significant canal foraminal stenosis, examination in 2018 document stiff cautious gait with small stride,  She underwent right knee replacement first in 2018, then revision by Dr. Garnette Raman, for  failed right total knee replacement  Since then, she has been wheelchair-bound, she used to live alone, lost her husband in February 2023, her son just moved in with her a week ago,  She was seen by Dr. Mohammed for few years, now on Sinemet  25/100 mg 2 tablets 3 times a day, was not sure about the benefit, because she is no longer ambulatory, walking her wheelchair, need assistant to transfer, denies bowel bladder incontinence  Over the years, she has developed fixed contraction of bilateral knee, significant right knee pain, also right ankle fixed plantarflexion, right lower extremity pain paresthesia below knee level,  UPDATE May 8th 2024: She return for electrodiagnostic study today, which showed evidence of moderate carpal tunnel syndrome, no evidence of intrinsic muscle disease or large fiber peripheral neuropathy to explain her gait abnormality,  She was noted to have significant dyskinesia, now on Sinemet  25/100 mg 3 tablets 3 times a day, will decrease to 2 tablets 3 times a day,  She has significant limited range of motion of bilateral knee, worsening on the right side, moderate to severe pain with passive stretch, has lost follow-up with orthopedic surgeon for few years  Update February 12, 2024 SS:   PHYSICAL EXAM:   There were no vitals filed for this visit.  There is no height or weight on file to calculate BMI.  PHYSICAL EXAMNIATION:  Gen: NAD, conversant, well nourised, well groomed                     Cardiovascular: Regular rate rhythm, no peripheral edema, warm, nontender. Eyes: Conjunctivae clear without exudates or hemorrhage Neck: Supple, no carotid bruits. Pulmonary: Clear to auscultation  bilaterally   NEUROLOGICAL EXAM:  MENTAL STATUS: Speech/cognition: Awake, alert, oriented to history taking and casual conversation CRANIAL NERVES: CN II: Visual fields are full to confrontation. Pupils are round equal and briskly reactive to light. CN III, IV, VI:  extraocular movement are normal. No ptosis. CN V: Facial sensation is intact to light touch CN VII: Face is symmetric with normal eye closure  CN VIII: Hearing is normal to causal conversation. CN IX, X: Phonation is normal. CN XI: Head turning and shoulder shrug are intact  MOTOR: Upper extremity motor strength is normal, right more than left bradykinesia, rigidity, Frequent dyskinesia movement of bilateral lower extremity, right worse than left,   She has fixed contraction of bilateral knee, maximum range of motion of left knee is 150 degree, no significant muscle weakness  Fixed contraction of right knee maximum is extension 110 degree, right ankle tends to stay in plantarflexion position, there was no significant muscle weakness,  REFLEXES: Reflexes are 2+ and symmetric at the biceps, triceps, absent at knees, and ankles. Plantar responses are extensor on the right, flaccid on left,  SENSORY: Intact to light touch, pinprick and vibratory sensation are intact in fingers and toes.  COORDINATION: There is no trunk or limb dysmetria noted.  GAIT/STANCE: Deferred  REVIEW OF SYSTEMS:  Full 14 system review of systems performed and notable only for as above All other review of systems were negative.   ALLERGIES: Allergies  Allergen Reactions   Hydrocodone  Other (See Comments)    Heart pain  Delusions   Tramadol  Other (See Comments)    Hallucinations, abd pain Delusions (intolerance)    HOME MEDICATIONS: Current Outpatient Medications  Medication Sig Dispense Refill   amLODipine  (NORVASC ) 5 MG tablet Take 5 mg by mouth daily.      aspirin  325 MG EC tablet Take 1 tablet (325 mg total) by mouth 2 (two) times daily. 30 tablet 0   B Complex Vitamins (B COMPLEX PO) Place 1 tablet under the tongue daily.     baclofen (LIORESAL) 10 MG tablet Take 10 mg by mouth at bedtime.      calcium -vitamin D  (OSCAL WITH D) 500-200 MG-UNIT tablet Take 1 tablet by mouth daily with breakfast. 90  tablet 1   carbidopa -levodopa  (SINEMET  IR) 25-100 MG tablet Take 2 tablets by mouth 3 (three) times daily. Must make and KEEP APPOINTMENT for further refills 180 tablet 0   Cyanocobalamin (VITAMIN B 12 PO) Take by mouth.     diazepam (VALIUM) 5 MG tablet Take 5 mg by mouth 2 (two) times daily.     ergocalciferol  (VITAMIN D2) 1.25 MG (50000 UT) capsule Take by mouth.     gabapentin  (NEURONTIN ) 300 MG capsule TAKE ONE CAPSULE BY MOUTH THREE TIMES DAILY @ 9AM-1PM-5PM 90 capsule 3   letrozole  (FEMARA ) 2.5 MG tablet Take 1 tablet (2.5 mg total) by mouth daily. 90 tablet 11   levothyroxine  (SYNTHROID ) 88 MCG tablet Take 88 mcg by mouth every morning.     MELATONIN PO Take by mouth.     pantoprazole  (PROTONIX ) 40 MG tablet Take 1 tablet (40 mg total) by mouth daily before breakfast. 90 tablet 3   polyethylene glycol (MIRALAX  / GLYCOLAX ) 17 g packet Take by mouth.     sulfamethoxazole -trimethoprim  (BACTRIM  DS) 800-160 MG tablet Take 1 tablet by mouth 2 (two) times daily. 14 tablet 0   traZODone  (DESYREL ) 50 MG tablet Take by mouth.     No current facility-administered medications for this visit.  PAST MEDICAL HISTORY: Past Medical History:  Diagnosis Date   Breast cancer (HCC) 11/2011   Chronic back pain    Chronic leg pain    bilateral   Chronic leukopenia    DDD (degenerative disc disease), cervical    DDD (degenerative disc disease), lumbar    Gait abnormality    GERD (gastroesophageal reflux disease)    Hypertension    Hypothyroidism    Parkinson's disease    Radiation 04/2012   33 treatments for breast cancer   Right knee pain    Thyroid  disease     PAST SURGICAL HISTORY: Past Surgical History:  Procedure Laterality Date   ABDOMINAL HYSTERECTOMY     partial   BREAST LUMPECTOMY Left 12/2011   CHOLECYSTECTOMY     COLONOSCOPY N/A 07/01/2013   Procedure: COLONOSCOPY;  Surgeon: Claudis RAYMOND Rivet, MD;  Location: [facility];  Service: Endoscopy;  Laterality: N/A;  1030    COLONOSCOPY N/A 09/15/2019   Procedure: COLONOSCOPY;  Surgeon: Rivet Claudis RAYMOND, MD;  Location: [facility];  Service: Endoscopy;  Laterality: N/A;  815   ESOPHAGOGASTRODUODENOSCOPY N/A 11/21/2017   Procedure: ESOPHAGOGASTRODUODENOSCOPY (EGD);  Surgeon: Rivet Claudis RAYMOND, MD;  Location: [facility];  Service: Endoscopy;  Laterality: N/A;  2:45   EXCISIONAL TOTAL KNEE ARTHROPLASTY WITH ANTIBIOTIC SPACERS Right 01/12/2018   Procedure: EXCISIONAL TOTAL KNEE ARTHROPLASTY WITH ANTIBIOTIC SPACERS;  Surgeon: Rubie Kemps, MD;  Location: [facility];  Service: Orthopedics;  Laterality: Right;   JOINT REPLACEMENT  01/2017   right knee   KNEE SURGERY Right 01/12/2018   EXCISIONAL TOTAL KNEE ARTHROPLASTY WITH ANTIBIOTIC SPACERS   MASTECTOMY, PARTIAL Right 11/03/2020   Procedure: RIGHT PARTIAL MASTECTOMY AFTER TAG PLACEMENT;  Surgeon: Mavis Anes, MD;  Location: [facility];  Service: General;  Laterality: Right;   POLYPECTOMY  09/15/2019   Procedure: POLYPECTOMY;  Surgeon: Rivet Claudis RAYMOND, MD;  Location: [facility];  Service: Endoscopy;;  Cold Snare transverse colon polyp   TOTAL KNEE REVISION Right 03/23/2018   Procedure: RIGHT TOTAL KNEE REVISION;  Surgeon: Rubie Kemps, MD;  Location: [facility];  Service: Orthopedics;  Laterality: Right;   TUBAL LIGATION      FAMILY HISTORY: Family History  Problem Relation Age of Onset   Cancer Mother 33       colon cancer   Cancer Father 91       throat cancer   Pancreatic cancer Brother        63y 11-14-15    SOCIAL HISTORY: Social History   Socioeconomic History   Marital status: Widowed    Spouse name: Debby   Number of children: 3   Years of education: 12 th   Highest education level: Not on file  Occupational History    Comment: retired  Tobacco Use   Smoking status: Former    Current packs/day: 0.00    Average packs/day: 0.5 packs/day for 18.0 years (9.0 ttl pk-yrs)    Types: Cigarettes    Start date: 09/29/1980    Quit date: 09/30/1998    Years  since quitting: 25.3   Smokeless tobacco: Never  Vaping Use   Vaping status: Never Used  Substance and Sexual Activity   Alcohol  use: No   Drug use: No   Sexual activity: Not on file  Other Topics Concern   Not on file  Social History Narrative   Patient lives at home with her husband Cathy).    Retired    [employer] school education   Right  handed   Social Drivers of Health   Financial Resource Strain: Low Risk  (10/05/2020)   Overall Financial Resource Strain (CARDIA)    Difficulty of Paying Living Expenses: Not very hard  Food Insecurity: No Food Insecurity (10/05/2020)   Hunger Vital Sign    Worried About Running Out of Food in the Last Year: Never true    Ran Out of Food in the Last Year: Never true  Transportation Needs: No Transportation Needs (10/05/2020)   PRAPARE - Administrator, Civil Service (Medical): No    Lack of Transportation (Non-Medical): No  Physical Activity: Inactive (10/05/2020)   Exercise Vital Sign    Days of Exercise per Week: 0 days    Minutes of Exercise per Session: 0 min  Stress: No Stress Concern Present (10/05/2020)   [employer] of Occupational Health - Occupational Stress Questionnaire    Feeling of Stress : Only a little  Social Connections: Socially Integrated (10/05/2020)   Social Connection and Isolation Panel    Frequency of Communication with Friends and Family: More than three times a week    Frequency of Social Gatherings with Friends and Family: More than three times a week    Attends Religious Services: More than 4 times per year    Active Member of [employer] or Organizations: No    Attends Engineer, structural: More than 4 times per year    Marital Status: Married  Catering manager Violence: Not At Risk (10/05/2020)   Humiliation, Afraid, Rape, and Kick questionnaire    Fear of Current or Ex-Partner: No    Emotionally Abused: No    Physically Abused: No    Sexually Abused: No   Lauraine Born, SCHARLENE, DNP   [facility] [address] [place], [place] [address] [phone]

## 2024-02-13 DIAGNOSIS — I10 Essential (primary) hypertension: Secondary | ICD-10-CM | POA: Diagnosis not present

## 2024-02-13 DIAGNOSIS — E782 Mixed hyperlipidemia: Secondary | ICD-10-CM | POA: Diagnosis not present

## 2024-02-13 DIAGNOSIS — K219 Gastro-esophageal reflux disease without esophagitis: Secondary | ICD-10-CM | POA: Diagnosis not present

## 2024-02-13 DIAGNOSIS — C50411 Malignant neoplasm of upper-outer quadrant of right female breast: Secondary | ICD-10-CM | POA: Diagnosis not present

## 2024-02-19 ENCOUNTER — Other Ambulatory Visit: Payer: Self-pay | Admitting: Neurology

## 2024-02-23 DIAGNOSIS — E782 Mixed hyperlipidemia: Secondary | ICD-10-CM | POA: Diagnosis not present

## 2024-02-23 DIAGNOSIS — E559 Vitamin D deficiency, unspecified: Secondary | ICD-10-CM | POA: Diagnosis not present

## 2024-02-23 DIAGNOSIS — H6121 Impacted cerumen, right ear: Secondary | ICD-10-CM | POA: Diagnosis not present

## 2024-02-23 DIAGNOSIS — I10 Essential (primary) hypertension: Secondary | ICD-10-CM | POA: Diagnosis not present

## 2024-02-23 DIAGNOSIS — Z170 Estrogen receptor positive status [ER+]: Secondary | ICD-10-CM | POA: Diagnosis not present

## 2024-02-23 DIAGNOSIS — F5104 Psychophysiologic insomnia: Secondary | ICD-10-CM | POA: Diagnosis not present

## 2024-02-23 DIAGNOSIS — C50411 Malignant neoplasm of upper-outer quadrant of right female breast: Secondary | ICD-10-CM | POA: Diagnosis not present

## 2024-02-23 DIAGNOSIS — E039 Hypothyroidism, unspecified: Secondary | ICD-10-CM | POA: Diagnosis not present

## 2024-02-23 DIAGNOSIS — K219 Gastro-esophageal reflux disease without esophagitis: Secondary | ICD-10-CM | POA: Diagnosis not present

## 2024-02-23 DIAGNOSIS — M8580 Other specified disorders of bone density and structure, unspecified site: Secondary | ICD-10-CM | POA: Diagnosis not present

## 2024-02-23 DIAGNOSIS — G20A1 Parkinson's disease without dyskinesia, without mention of fluctuations: Secondary | ICD-10-CM | POA: Diagnosis not present

## 2024-02-23 DIAGNOSIS — K5909 Other constipation: Secondary | ICD-10-CM | POA: Diagnosis not present

## 2024-03-02 DIAGNOSIS — B351 Tinea unguium: Secondary | ICD-10-CM | POA: Diagnosis not present

## 2024-03-16 DIAGNOSIS — I10 Essential (primary) hypertension: Secondary | ICD-10-CM | POA: Diagnosis not present

## 2024-03-16 DIAGNOSIS — E782 Mixed hyperlipidemia: Secondary | ICD-10-CM | POA: Diagnosis not present

## 2024-03-16 DIAGNOSIS — G20A1 Parkinson's disease without dyskinesia, without mention of fluctuations: Secondary | ICD-10-CM | POA: Diagnosis not present

## 2024-03-16 DIAGNOSIS — K219 Gastro-esophageal reflux disease without esophagitis: Secondary | ICD-10-CM | POA: Diagnosis not present

## 2024-03-19 DIAGNOSIS — G20A1 Parkinson's disease without dyskinesia, without mention of fluctuations: Secondary | ICD-10-CM | POA: Diagnosis not present

## 2024-03-19 DIAGNOSIS — K219 Gastro-esophageal reflux disease without esophagitis: Secondary | ICD-10-CM | POA: Diagnosis not present

## 2024-03-19 DIAGNOSIS — E782 Mixed hyperlipidemia: Secondary | ICD-10-CM | POA: Diagnosis not present

## 2024-03-19 DIAGNOSIS — I10 Essential (primary) hypertension: Secondary | ICD-10-CM | POA: Diagnosis not present

## 2024-03-19 DIAGNOSIS — K5909 Other constipation: Secondary | ICD-10-CM | POA: Diagnosis not present

## 2024-03-19 DIAGNOSIS — Z170 Estrogen receptor positive status [ER+]: Secondary | ICD-10-CM | POA: Diagnosis not present

## 2024-03-19 DIAGNOSIS — E039 Hypothyroidism, unspecified: Secondary | ICD-10-CM | POA: Diagnosis not present

## 2024-03-19 DIAGNOSIS — M8580 Other specified disorders of bone density and structure, unspecified site: Secondary | ICD-10-CM | POA: Diagnosis not present

## 2024-03-19 DIAGNOSIS — C50411 Malignant neoplasm of upper-outer quadrant of right female breast: Secondary | ICD-10-CM | POA: Diagnosis not present

## 2024-03-19 DIAGNOSIS — M76821 Posterior tibial tendinitis, right leg: Secondary | ICD-10-CM | POA: Diagnosis not present

## 2024-03-19 DIAGNOSIS — E559 Vitamin D deficiency, unspecified: Secondary | ICD-10-CM | POA: Diagnosis not present

## 2024-03-19 DIAGNOSIS — F5104 Psychophysiologic insomnia: Secondary | ICD-10-CM | POA: Diagnosis not present

## 2024-04-09 DIAGNOSIS — R194 Change in bowel habit: Secondary | ICD-10-CM | POA: Diagnosis not present

## 2024-04-09 DIAGNOSIS — R109 Unspecified abdominal pain: Secondary | ICD-10-CM | POA: Diagnosis not present

## 2024-04-09 DIAGNOSIS — R151 Fecal smearing: Secondary | ICD-10-CM | POA: Diagnosis not present

## 2024-04-13 DIAGNOSIS — K219 Gastro-esophageal reflux disease without esophagitis: Secondary | ICD-10-CM | POA: Diagnosis not present

## 2024-04-13 DIAGNOSIS — M8580 Other specified disorders of bone density and structure, unspecified site: Secondary | ICD-10-CM | POA: Diagnosis not present

## 2024-04-13 DIAGNOSIS — Z170 Estrogen receptor positive status [ER+]: Secondary | ICD-10-CM | POA: Diagnosis not present

## 2024-04-13 DIAGNOSIS — F5104 Psychophysiologic insomnia: Secondary | ICD-10-CM | POA: Diagnosis not present

## 2024-04-13 DIAGNOSIS — E782 Mixed hyperlipidemia: Secondary | ICD-10-CM | POA: Diagnosis not present

## 2024-04-13 DIAGNOSIS — I10 Essential (primary) hypertension: Secondary | ICD-10-CM | POA: Diagnosis not present

## 2024-04-13 DIAGNOSIS — K5909 Other constipation: Secondary | ICD-10-CM | POA: Diagnosis not present

## 2024-04-13 DIAGNOSIS — E039 Hypothyroidism, unspecified: Secondary | ICD-10-CM | POA: Diagnosis not present

## 2024-04-13 DIAGNOSIS — G20A1 Parkinson's disease without dyskinesia, without mention of fluctuations: Secondary | ICD-10-CM | POA: Diagnosis not present

## 2024-04-13 DIAGNOSIS — E559 Vitamin D deficiency, unspecified: Secondary | ICD-10-CM | POA: Diagnosis not present

## 2024-04-13 DIAGNOSIS — M76821 Posterior tibial tendinitis, right leg: Secondary | ICD-10-CM | POA: Diagnosis not present

## 2024-04-13 DIAGNOSIS — C50411 Malignant neoplasm of upper-outer quadrant of right female breast: Secondary | ICD-10-CM | POA: Diagnosis not present

## 2024-04-16 DIAGNOSIS — I10 Essential (primary) hypertension: Secondary | ICD-10-CM | POA: Diagnosis not present

## 2024-04-16 DIAGNOSIS — E782 Mixed hyperlipidemia: Secondary | ICD-10-CM | POA: Diagnosis not present

## 2024-04-16 DIAGNOSIS — G20A1 Parkinson's disease without dyskinesia, without mention of fluctuations: Secondary | ICD-10-CM | POA: Diagnosis not present

## 2024-04-16 DIAGNOSIS — E039 Hypothyroidism, unspecified: Secondary | ICD-10-CM | POA: Diagnosis not present

## 2024-04-19 ENCOUNTER — Encounter (HOSPITAL_COMMUNITY): Payer: Self-pay | Admitting: Emergency Medicine

## 2024-04-19 ENCOUNTER — Emergency Department (HOSPITAL_COMMUNITY)
Admission: EM | Admit: 2024-04-19 | Discharge: 2024-04-20 | Disposition: A | Attending: Emergency Medicine | Admitting: Emergency Medicine

## 2024-04-19 ENCOUNTER — Emergency Department (HOSPITAL_COMMUNITY)

## 2024-04-19 ENCOUNTER — Other Ambulatory Visit: Payer: Self-pay

## 2024-04-19 DIAGNOSIS — Z7982 Long term (current) use of aspirin: Secondary | ICD-10-CM | POA: Insufficient documentation

## 2024-04-19 DIAGNOSIS — Z79899 Other long term (current) drug therapy: Secondary | ICD-10-CM | POA: Diagnosis not present

## 2024-04-19 DIAGNOSIS — R29818 Other symptoms and signs involving the nervous system: Secondary | ICD-10-CM | POA: Diagnosis not present

## 2024-04-19 DIAGNOSIS — R42 Dizziness and giddiness: Secondary | ICD-10-CM | POA: Diagnosis not present

## 2024-04-19 DIAGNOSIS — Z853 Personal history of malignant neoplasm of breast: Secondary | ICD-10-CM | POA: Insufficient documentation

## 2024-04-19 DIAGNOSIS — G8929 Other chronic pain: Secondary | ICD-10-CM | POA: Diagnosis not present

## 2024-04-19 DIAGNOSIS — N3090 Cystitis, unspecified without hematuria: Secondary | ICD-10-CM | POA: Insufficient documentation

## 2024-04-19 DIAGNOSIS — G20A1 Parkinson's disease without dyskinesia, without mention of fluctuations: Secondary | ICD-10-CM | POA: Insufficient documentation

## 2024-04-19 DIAGNOSIS — Z993 Dependence on wheelchair: Secondary | ICD-10-CM | POA: Diagnosis not present

## 2024-04-19 DIAGNOSIS — R6889 Other general symptoms and signs: Secondary | ICD-10-CM | POA: Diagnosis not present

## 2024-04-19 DIAGNOSIS — Z743 Need for continuous supervision: Secondary | ICD-10-CM | POA: Diagnosis not present

## 2024-04-19 DIAGNOSIS — R404 Transient alteration of awareness: Secondary | ICD-10-CM | POA: Diagnosis not present

## 2024-04-19 LAB — CBC WITH DIFFERENTIAL/PLATELET
Abs Immature Granulocytes: 0.01 K/uL (ref 0.00–0.07)
Basophils Absolute: 0 K/uL (ref 0.0–0.1)
Basophils Relative: 1 %
Eosinophils Absolute: 0.1 K/uL (ref 0.0–0.5)
Eosinophils Relative: 3 %
HCT: 40.3 % (ref 36.0–46.0)
Hemoglobin: 12.8 g/dL (ref 12.0–15.0)
Immature Granulocytes: 0 %
Lymphocytes Relative: 20 %
Lymphs Abs: 0.9 K/uL (ref 0.7–4.0)
MCH: 28.7 pg (ref 26.0–34.0)
MCHC: 31.8 g/dL (ref 30.0–36.0)
MCV: 90.4 fL (ref 80.0–100.0)
Monocytes Absolute: 0.3 K/uL (ref 0.1–1.0)
Monocytes Relative: 8 %
Neutro Abs: 2.9 K/uL (ref 1.7–7.7)
Neutrophils Relative %: 68 %
Platelets: 245 K/uL (ref 150–400)
RBC: 4.46 MIL/uL (ref 3.87–5.11)
RDW: 12.4 % (ref 11.5–15.5)
WBC: 4.3 K/uL (ref 4.0–10.5)
nRBC: 0 % (ref 0.0–0.2)

## 2024-04-19 LAB — COMPREHENSIVE METABOLIC PANEL WITH GFR
ALT: <5 U/L (ref 0–44)
AST: 24 U/L (ref 15–41)
Albumin: 4.3 g/dL (ref 3.5–5.0)
Alkaline Phosphatase: 101 U/L (ref 38–126)
Anion gap: 12 (ref 5–15)
BUN: 11 mg/dL (ref 8–23)
CO2: 26 mmol/L (ref 22–32)
Calcium: 9.7 mg/dL (ref 8.9–10.3)
Chloride: 103 mmol/L (ref 98–111)
Creatinine, Ser: 0.82 mg/dL (ref 0.44–1.00)
GFR, Estimated: >60 mL/min (ref >60)
Glucose, Bld: 84 mg/dL (ref 70–99)
Potassium: 4.2 mmol/L (ref 3.5–5.1)
Sodium: 141 mmol/L (ref 135–145)
Total Bilirubin: 0.4 mg/dL (ref 0.0–1.2)
Total Protein: 7.6 g/dL (ref 6.5–8.1)

## 2024-04-19 LAB — URINALYSIS, ROUTINE W REFLEX MICROSCOPIC
Bilirubin Urine: NEGATIVE
Glucose, UA: NEGATIVE mg/dL
Hgb urine dipstick: NEGATIVE
Ketones, ur: NEGATIVE mg/dL
Nitrite: POSITIVE — AB
Protein, ur: NEGATIVE mg/dL
Specific Gravity, Urine: 1.009 (ref 1.005–1.030)
pH: 7 (ref 5.0–8.0)

## 2024-04-19 LAB — MAGNESIUM: Magnesium: 2.3 mg/dL (ref 1.7–2.4)

## 2024-04-19 LAB — TROPONIN T, HIGH SENSITIVITY
Troponin T High Sensitivity: <15 ng/L (ref 0–19)
Troponin T High Sensitivity: <15 ng/L (ref 0–19)

## 2024-04-19 LAB — TSH: TSH: 1.69 u[IU]/mL (ref 0.350–4.500)

## 2024-04-19 LAB — CBG MONITORING, ED: Glucose-Capillary: 84 mg/dL (ref 70–99)

## 2024-04-19 MED ORDER — AMLODIPINE BESYLATE 5 MG PO TABS
5.0000 mg | ORAL_TABLET | Freq: Every day | ORAL | Status: DC
  Administered 2024-04-19: 5 mg via ORAL
  Filled 2024-04-19: qty 1

## 2024-04-19 MED ORDER — LACTATED RINGERS IV BOLUS
500.0000 mL | Freq: Once | INTRAVENOUS | Status: AC
  Administered 2024-04-19: 500 mL via INTRAVENOUS

## 2024-04-19 MED ORDER — DIAZEPAM 5 MG PO TABS
5.0000 mg | ORAL_TABLET | Freq: Two times a day (BID) | ORAL | Status: DC
  Administered 2024-04-19: 5 mg via ORAL
  Filled 2024-04-19: qty 1

## 2024-04-19 MED ORDER — BACLOFEN 10 MG PO TABS
5.0000 mg | ORAL_TABLET | Freq: Every day | ORAL | Status: DC
  Administered 2024-04-19: 5 mg via ORAL
  Filled 2024-04-19: qty 1

## 2024-04-19 MED ORDER — SODIUM CHLORIDE 0.9 % IV SOLN
1.0000 g | Freq: Once | INTRAVENOUS | Status: AC
  Administered 2024-04-19: 1 g via INTRAVENOUS
  Filled 2024-04-19: qty 10

## 2024-04-19 MED ORDER — CEPHALEXIN 500 MG PO CAPS: 500.0000 mg | ORAL_CAPSULE | Freq: Four times a day (QID) | ORAL | 0 refills | Status: AC

## 2024-04-19 MED ORDER — MECLIZINE HCL 12.5 MG PO TABS
25.0000 mg | ORAL_TABLET | Freq: Once | ORAL | Status: AC
  Administered 2024-04-19: 25 mg via ORAL

## 2024-04-19 MED ORDER — GABAPENTIN 300 MG PO CAPS
300.0000 mg | ORAL_CAPSULE | Freq: Three times a day (TID) | ORAL | Status: DC
  Filled 2024-04-19: qty 1

## 2024-04-19 MED ORDER — CARBIDOPA-LEVODOPA 25-100 MG PO TABS
2.0000 | ORAL_TABLET | Freq: Three times a day (TID) | ORAL | Status: DC
Start: 2024-04-19 — End: 2024-04-20
  Administered 2024-04-19: 2 via ORAL
  Filled 2024-04-19: qty 2

## 2024-04-19 MED ORDER — LEVOTHYROXINE SODIUM 88 MCG PO TABS
88.0000 ug | ORAL_TABLET | Freq: Every morning | ORAL | Status: DC
Start: 2024-04-20 — End: 2024-04-20

## 2024-04-19 MED ORDER — MECLIZINE HCL 12.5 MG PO TABS: 12.5000 mg | ORAL_TABLET | Freq: Three times a day (TID) | ORAL | 0 refills | Status: AC | PRN

## 2024-04-19 NOTE — Discharge Instructions (Addendum)
 You received your first dose of antibiotics for treatment of UTI while in the emergency department.  A prescription for ongoing antibiotics was sent to your pharmacy.  Take as prescribed, starting tomorrow night.  Drink plenty of fluids to stay hydrated.  A prescription for a medication called meclizine was also sent to your pharmacy.  Take this as needed for dizziness.  Follow-up with your primary care doctor.  Return to the emergency department for any new or worsening symptoms of concern.

## 2024-04-19 NOTE — ED Notes (Signed)
 Pt & pt's family state pt is unable to get into her home until her son gets off of work tomorrow at [employer]. States son will come to get her. Pt & family state pt is unable to get into her house without assistance. Offered EMS transport home for pt but then they stated no one has a key except for the son that is able to pick her up tomorrow. Family denies any other options to help the pt home.

## 2024-04-19 NOTE — ED Notes (Signed)
 Went into pt's room to discharge pt, but pt's family had concerns and asked to speak with the EDP again.  EDP made aware.

## 2024-04-19 NOTE — ED Triage Notes (Signed)
 Pt c/o dizziness since 1730.

## 2024-04-19 NOTE — ED Notes (Signed)
 Patient transported to MRI

## 2024-04-19 NOTE — ED Provider Notes (Addendum)
 [place] EMERGENCY DEPARTMENT AT [facility] Provider Note   CSN: [id number] Arrival date & time: 04/19/24  1831     Patient presents with: No chief complaint on file.   Lisa Torres is a 78 y.o. female.   HPI Patient presents for dizziness.  Medical history includes breast cancer, Parkinson's disease, GERD, chronic pain.  Prescribed medications include Sinemet , Valium, Synthroid .  At baseline, she is wheelchair-bound.  She describes history of mild intermittent dizziness.  She also describes an acute onset of more severe dizziness this evening.  She states that symptoms do worsen with positional changes.  She does endorse some mild dizziness at this time.  She denies any areas of discomfort.    Prior to Admission medications   Medication Sig Start Date End Date Taking? Authorizing Provider  cephALEXin (KEFLEX) 500 MG capsule Take 1 capsule (500 mg total) by mouth 4 (four) times daily. 04/19/24  Yes Melvenia Motto, MD  meclizine (ANTIVERT) 12.5 MG tablet Take 1 tablet (12.5 mg total) by mouth 3 (three) times daily as needed for dizziness. 04/19/24  Yes Melvenia Motto, MD  amLODipine  (NORVASC ) 5 MG tablet Take 5 mg by mouth daily.     [provider]  aspirin  325 MG EC tablet Take 1 tablet (325 mg total) by mouth 2 (two) times daily. 09/16/19   Golda Claudis PENNER, MD  B Complex Vitamins (B COMPLEX PO) Place 1 tablet under the tongue daily.    [provider]  baclofen (LIORESAL) 10 MG tablet Take 10 mg by mouth at bedtime.  01/13/19   [provider]  calcium -vitamin D  (OSCAL WITH D) 500-200 MG-UNIT tablet Take 1 tablet by mouth daily with breakfast. 12/05/20   Federico Norleen ONEIDA MADISON, MD  carbidopa -levodopa  (SINEMET  IR) 25-100 MG tablet TAKE TWO TABLETS BY MOUTH THREE TIMES DAILY AT 9AM, 1PM AND 5PM MUST MAKE AND KEEP APPOINTMENT FOR FURTHER REFILLS 02/19/24   Onita Duos, MD  Cyanocobalamin (VITAMIN B 12 PO) Take by mouth.    [provider]  diazepam  (VALIUM) 5 MG tablet Take 5 mg by mouth 2 (two) times daily. 11/30/18   [provider]  ergocalciferol  (VITAMIN D2) 1.25 MG (50000 UT) capsule Take by mouth.    [provider]  gabapentin  (NEURONTIN ) 300 MG capsule TAKE ONE CAPSULE BY MOUTH THREE TIMES DAILY @ 9AM-1PM-5PM 06/24/23   Onita Duos, MD  letrozole  (FEMARA ) 2.5 MG tablet Take 1 tablet (2.5 mg total) by mouth daily. 12/05/23   Geofm Delon BRAVO, NP  levothyroxine  (SYNTHROID ) 88 MCG tablet Take 88 mcg by mouth every morning.    [provider]  MELATONIN PO Take by mouth.    [provider]  pantoprazole  (PROTONIX ) 40 MG tablet Take 1 tablet (40 mg total) by mouth daily before breakfast. 06/27/20   Rehman, Claudis PENNER, MD  polyethylene glycol (MIRALAX  / GLYCOLAX ) 17 g packet Take by mouth. 12/26/17   [provider]  sulfamethoxazole -trimethoprim  (BACTRIM  DS) 800-160 MG tablet Take 1 tablet by mouth 2 (two) times daily. 12/08/23   Geofm Delon BRAVO, NP  traZODone  (DESYREL ) 50 MG tablet Take by mouth. 12/26/17   [provider]    Allergies: Hydrocodone  and Tramadol     Review of Systems  Neurological:  Positive for dizziness.  All other systems reviewed and are negative.   Updated Vital Signs BP (!) 158/97   Pulse 80   Temp 97.8 F (36.6 C) (Oral)   Resp 18   Ht 5'  1 (1.549 m)   Wt 74.8 kg   SpO2 100%   BMI 31.16 kg/m   Physical Exam Vitals and nursing note reviewed.  Constitutional:      General: She is not in acute distress.    Appearance: Normal appearance. She is well-developed. She is not ill-appearing, toxic-appearing or diaphoretic.  HENT:     Head: Normocephalic and atraumatic.     Right Ear: External ear normal.     Left Ear: External ear normal.     Nose: Nose normal.     Mouth/Throat:     Mouth: Mucous membranes are moist.  Eyes:     Extraocular Movements: Extraocular movements intact.     Conjunctiva/sclera: Conjunctivae normal.  Cardiovascular:     Rate  and Rhythm: Normal rate and regular rhythm.  Pulmonary:     Effort: Pulmonary effort is normal. No respiratory distress.  Abdominal:     General: There is no distension.     Palpations: Abdomen is soft.     Tenderness: There is no abdominal tenderness.  Musculoskeletal:        General: No swelling or deformity.     Cervical back: Normal range of motion and neck supple.  Skin:    General: Skin is warm and dry.  Neurological:     General: No focal deficit present.     Mental Status: She is alert and oriented to person, place, and time.     Cranial Nerves: No facial asymmetry.     Sensory: Sensation is intact.     Motor: Tremor present.     Coordination: Coordination is intact. Coordination normal. Finger-Nose-Finger Test normal.  Psychiatric:        Mood and Affect: Mood normal.        Behavior: Behavior normal.     (all labs ordered are listed, but only abnormal results are displayed) Labs Reviewed  URINALYSIS, ROUTINE W REFLEX MICROSCOPIC - Abnormal; Notable for the following components:      Result Value   APPearance HAZY (*)    Nitrite POSITIVE (*)    Leukocytes,Ua MODERATE (*)    Bacteria, UA RARE (*)    All other components within normal limits  URINE CULTURE  COMPREHENSIVE METABOLIC PANEL WITH GFR  CBC WITH DIFFERENTIAL/PLATELET  MAGNESIUM   TSH  CBG MONITORING, ED  TROPONIN T, HIGH SENSITIVITY  TROPONIN T, HIGH SENSITIVITY    EKG: EKG Interpretation Date/Time:  Monday April 19 2024 18:43:24 EST Ventricular Rate:  83 PR Interval:  158 QRS Duration:  88 QT Interval:  366 QTC Calculation: 430 R Axis:   36  Text Interpretation: Sinus rhythm Nonspecific T abnormalities, lateral leads Confirmed by Melvenia Motto [phone]) on 04/19/2024 7:46:29 PM  Radiology: MR BRAIN WO CONTRAST Result Date: 04/19/2024 EXAM: MRI Brain Without Contrast 04/19/2024 07:14:43 PM TECHNIQUE: Multiplanar multisequence MRI of the head/brain was performed without the administration of  intravenous contrast. COMPARISON: None available. CLINICAL HISTORY: Neuro deficit, acute, stroke suspected FINDINGS: BRAIN AND VENTRICLES: No acute infarct. No intracranial hemorrhage. No mass. No midline shift. No hydrocephalus. Normal flow voids. ORBITS: No acute abnormality. SINUSES AND MASTOIDS: Opacified right maxillary sinus. No mastoid effusions. BONES AND SOFT TISSUES: Normal marrow signal. IMPRESSION: 1. No acute intracranial abnormality. 2. Opacified right maxillary sinus. Electronically signed by: Gilmore Molt MD 04/19/2024 08:52 PM EST RP Workstation: [id number]     Procedures   Medications Ordered in the ED  cefTRIAXone  (ROCEPHIN ) 1 g in sodium chloride  0.9 % 100 mL IVPB (has no  administration in time range)  lactated ringers  bolus 500 mL (500 mLs Intravenous Bolus 04/19/24 1850)  meclizine (ANTIVERT) tablet 25 mg (25 mg Oral Given 04/19/24 1938)                                    Medical Decision Making Amount and/or Complexity of Data Reviewed Labs: ordered. Radiology: ordered.  Risk Prescription drug management.   This patient presents to the ED for concern of dizziness, this involves an extensive number of treatment options, and is a complaint that carries with it a high risk of complications and morbidity.  The differential diagnosis includes vestibular neuritis, BPPV, Mnire's disease, CVA, dehydration, anemia, metabolic derangements   Co morbidities / Chronic conditions that complicate the patient evaluation  breast cancer, Parkinson's disease, GERD, chronic pain   Additional history obtained:  Additional history obtained from EMR External records from outside source obtained and reviewed including N/A   Lab Tests:  I Ordered, and personally interpreted labs.  The pertinent results include: Normal hemoglobin, no leukocytosis, normal kidney function, normal electrolytes, normal troponin.  Urinalysis does show evidence of UTI.   Imaging Studies  ordered:  I ordered imaging studies including MRI brain I independently visualized and interpreted imaging which showed no acute findings I agree with the radiologist interpretation   Cardiac Monitoring: / EKG:  The patient was maintained on a cardiac monitor.  I personally viewed and interpreted the cardiac monitored which showed an underlying rhythm of: Sinus rhythm   Problem List / ED Course / Critical interventions / Medication management  Patient presenting for acute onset of dizziness this evening.  On arrival in the ED, vital signs notable for moderate hypertension.  On exam, patient is overall well-appearing.  She does have a mild tremor consistent with known history of Parkinson's disease.  She describes a current mild dizziness at rest.  She does not appear to have any focal neurologic deficits.  IV fluids and meclizine were ordered.  Workup was initiated.  Serum lab work was unremarkable.  Patient underwent MRI which did not show any acute findings.  On reassessment, she does report improvement in her dizziness.  Her urinalysis does show evidence of nitrite positive UTI.  Dose of ceftriaxone  was ordered.  Patient was prescribed ongoing antibiotics in addition to as needed meclizine.  Plan was for discharge, however, family members who are with her state that she is unable to get into her home until her son returns.  They estimate that this will be around 11 AM.  Patient's home medications ordered.  Patient to board in the ED tonight. I ordered medication including IV fluids and meclizine for dizziness; ceftriaxone  for UTI Reevaluation of the patient after these medicines showed that the patient improved I have reviewed the patients home medicines and have made adjustments as needed   Social Determinants of Health:  Wheelchair-bound at baseline     Final diagnoses:  Dizziness  Cystitis    ED Discharge Orders          Ordered    cephALEXin (KEFLEX) 500 MG capsule  4 times  daily        04/19/24 2126    meclizine (ANTIVERT) 12.5 MG tablet  3 times daily PRN        04/19/24 2126               Melvenia Motto, MD 04/19/24 2126  Melvenia Motto, MD 04/19/24 [phone]

## 2024-04-20 NOTE — ED Notes (Signed)
 Pt provided heat compress for neck. States she uses them at home to help when she is sleeping. Pt has no complaints or needs at this time. Stretcher locked in lowest position, call bell in reach.

## 2024-04-22 LAB — URINE CULTURE: Culture: >=100000 — AB

## 2024-04-23 ENCOUNTER — Telehealth (HOSPITAL_BASED_OUTPATIENT_CLINIC_OR_DEPARTMENT_OTHER): Payer: Self-pay | Admitting: Emergency Medicine

## 2024-04-23 NOTE — Telephone Encounter (Signed)
 Post ED Visit - Positive Culture Follow-up  Culture report reviewed by antimicrobial stewardship pharmacist: [facility] []  Rankin Dee, Pharm.D. []  Venetia Gully, Pharm.D., BCPS AQ-ID []  Garrel Crews, Pharm.D., BCPS []  Almarie Lunger, [address].D., BCPS []  [place], [address].D., BCPS, AAHIVP []  Rosaline Bihari, Pharm.D., BCPS, AAHIVP []  Vernell Meier, PharmD, BCPS []  Latanya Hint, PharmD, BCPS []  Donald Medley, PharmD, BCPS [x]  Maurilio Patten, PharmD []  Dorothyann Alert, PharmD, BCPS []  Morene Babe, PharmD  [facility] []  Rosaline Edison, PharmD []  Romona Bliss, PharmD []  Dolphus Roller, PharmD []  Veva Seip, Rph []  Vernell Daunt) Leonce, PharmD []  Eva Allis, PharmD []  Rosaline Millet, PharmD []  Iantha Batch, PharmD []  Arvin Gauss, PharmD []  Wanda Hasting, PharmD []  Ronal Rav, PharmD []  Rocky Slade, PharmD []  Bard Jeans, PharmD   Positive urine culture Treated with Cephalexin, organism sensitive to the same and no further patient follow-up is required at this time.  Clotilda BROCKS Royce Stegman 04/23/2024, 11:42 AM

## 2024-05-06 DIAGNOSIS — C50411 Malignant neoplasm of upper-outer quadrant of right female breast: Secondary | ICD-10-CM | POA: Diagnosis not present

## 2024-05-06 DIAGNOSIS — Z170 Estrogen receptor positive status [ER+]: Secondary | ICD-10-CM | POA: Diagnosis not present

## 2024-05-06 DIAGNOSIS — F5104 Psychophysiologic insomnia: Secondary | ICD-10-CM | POA: Diagnosis not present

## 2024-05-06 DIAGNOSIS — E039 Hypothyroidism, unspecified: Secondary | ICD-10-CM | POA: Diagnosis not present

## 2024-05-06 DIAGNOSIS — K5909 Other constipation: Secondary | ICD-10-CM | POA: Diagnosis not present

## 2024-05-06 DIAGNOSIS — G20A1 Parkinson's disease without dyskinesia, without mention of fluctuations: Secondary | ICD-10-CM | POA: Diagnosis not present

## 2024-05-06 DIAGNOSIS — E559 Vitamin D deficiency, unspecified: Secondary | ICD-10-CM | POA: Diagnosis not present

## 2024-05-06 DIAGNOSIS — I10 Essential (primary) hypertension: Secondary | ICD-10-CM | POA: Diagnosis not present

## 2024-05-06 DIAGNOSIS — M76821 Posterior tibial tendinitis, right leg: Secondary | ICD-10-CM | POA: Diagnosis not present

## 2024-05-06 DIAGNOSIS — M8580 Other specified disorders of bone density and structure, unspecified site: Secondary | ICD-10-CM | POA: Diagnosis not present

## 2024-05-06 DIAGNOSIS — E782 Mixed hyperlipidemia: Secondary | ICD-10-CM | POA: Diagnosis not present

## 2024-05-06 DIAGNOSIS — K219 Gastro-esophageal reflux disease without esophagitis: Secondary | ICD-10-CM | POA: Diagnosis not present

## 2024-05-14 DIAGNOSIS — R5383 Other fatigue: Secondary | ICD-10-CM | POA: Diagnosis not present

## 2024-05-14 DIAGNOSIS — E559 Vitamin D deficiency, unspecified: Secondary | ICD-10-CM | POA: Diagnosis not present

## 2024-05-14 DIAGNOSIS — E7849 Other hyperlipidemia: Secondary | ICD-10-CM | POA: Diagnosis not present

## 2024-05-14 DIAGNOSIS — E876 Hypokalemia: Secondary | ICD-10-CM | POA: Diagnosis not present

## 2024-05-14 DIAGNOSIS — E039 Hypothyroidism, unspecified: Secondary | ICD-10-CM | POA: Diagnosis not present

## 2024-05-16 DIAGNOSIS — I10 Essential (primary) hypertension: Secondary | ICD-10-CM | POA: Diagnosis not present

## 2024-05-16 DIAGNOSIS — F411 Generalized anxiety disorder: Secondary | ICD-10-CM | POA: Diagnosis not present

## 2024-05-16 DIAGNOSIS — E039 Hypothyroidism, unspecified: Secondary | ICD-10-CM | POA: Diagnosis not present

## 2024-05-16 DIAGNOSIS — E782 Mixed hyperlipidemia: Secondary | ICD-10-CM | POA: Diagnosis not present

## 2024-05-28 ENCOUNTER — Inpatient Hospital Stay: Attending: Oncology

## 2024-05-28 DIAGNOSIS — Z79811 Long term (current) use of aromatase inhibitors: Secondary | ICD-10-CM | POA: Diagnosis not present

## 2024-05-28 DIAGNOSIS — C50411 Malignant neoplasm of upper-outer quadrant of right female breast: Secondary | ICD-10-CM | POA: Diagnosis present

## 2024-05-28 DIAGNOSIS — Z87891 Personal history of nicotine dependence: Secondary | ICD-10-CM | POA: Diagnosis not present

## 2024-05-28 DIAGNOSIS — M8580 Other specified disorders of bone density and structure, unspecified site: Secondary | ICD-10-CM | POA: Insufficient documentation

## 2024-05-28 DIAGNOSIS — G20A1 Parkinson's disease without dyskinesia, without mention of fluctuations: Secondary | ICD-10-CM | POA: Insufficient documentation

## 2024-05-28 DIAGNOSIS — R5383 Other fatigue: Secondary | ICD-10-CM | POA: Insufficient documentation

## 2024-05-28 DIAGNOSIS — E559 Vitamin D deficiency, unspecified: Secondary | ICD-10-CM

## 2024-05-28 DIAGNOSIS — Z853 Personal history of malignant neoplasm of breast: Secondary | ICD-10-CM | POA: Insufficient documentation

## 2024-05-28 DIAGNOSIS — Z800 Family history of malignant neoplasm of digestive organs: Secondary | ICD-10-CM | POA: Diagnosis not present

## 2024-05-28 DIAGNOSIS — K5900 Constipation, unspecified: Secondary | ICD-10-CM | POA: Insufficient documentation

## 2024-05-28 DIAGNOSIS — Z923 Personal history of irradiation: Secondary | ICD-10-CM | POA: Insufficient documentation

## 2024-05-28 LAB — CBC WITH DIFFERENTIAL/PLATELET
Abs Immature Granulocytes: 0.01 K/uL (ref 0.00–0.07)
Basophils Absolute: 0 K/uL (ref 0.0–0.1)
Basophils Relative: 1 %
Eosinophils Absolute: 0.1 K/uL (ref 0.0–0.5)
Eosinophils Relative: 3 %
HCT: 37.5 % (ref 36.0–46.0)
Hemoglobin: 12 g/dL (ref 12.0–15.0)
Immature Granulocytes: 0 %
Lymphocytes Relative: 27 %
Lymphs Abs: 1.1 K/uL (ref 0.7–4.0)
MCH: 28.8 pg (ref 26.0–34.0)
MCHC: 32 g/dL (ref 30.0–36.0)
MCV: 90.1 fL (ref 80.0–100.0)
Monocytes Absolute: 0.3 K/uL (ref 0.1–1.0)
Monocytes Relative: 8 %
Neutro Abs: 2.5 K/uL (ref 1.7–7.7)
Neutrophils Relative %: 61 %
Platelets: 260 K/uL (ref 150–400)
RBC: 4.16 MIL/uL (ref 3.87–5.11)
RDW: 12.2 % (ref 11.5–15.5)
WBC: 4.1 K/uL (ref 4.0–10.5)
nRBC: 0 % (ref 0.0–0.2)

## 2024-05-28 LAB — COMPREHENSIVE METABOLIC PANEL WITH GFR
ALT: <5 U/L (ref 0–44)
AST: 21 U/L (ref 15–41)
Albumin: 4.5 g/dL (ref 3.5–5.0)
Alkaline Phosphatase: 98 U/L (ref 38–126)
Anion gap: 14 (ref 5–15)
BUN: 12 mg/dL (ref 8–23)
CO2: 25 mmol/L (ref 22–32)
Calcium: 10.1 mg/dL (ref 8.9–10.3)
Chloride: 105 mmol/L (ref 98–111)
Creatinine, Ser: 0.76 mg/dL (ref 0.44–1.00)
GFR, Estimated: >60 mL/min (ref >60)
Glucose, Bld: 99 mg/dL (ref 70–99)
Potassium: 4 mmol/L (ref 3.5–5.1)
Sodium: 143 mmol/L (ref 135–145)
Total Bilirubin: 0.5 mg/dL (ref 0.0–1.2)
Total Protein: 7.9 g/dL (ref 6.5–8.1)

## 2024-05-28 LAB — VITAMIN D 25 HYDROXY (VIT D DEFICIENCY, FRACTURES): Vit D, 25-Hydroxy: 46.7 ng/mL (ref 30–100)

## 2024-06-04 ENCOUNTER — Inpatient Hospital Stay: Admitting: Oncology

## 2024-06-04 DIAGNOSIS — M8580 Other specified disorders of bone density and structure, unspecified site: Secondary | ICD-10-CM | POA: Diagnosis not present

## 2024-06-04 DIAGNOSIS — Z170 Estrogen receptor positive status [ER+]: Secondary | ICD-10-CM | POA: Diagnosis not present

## 2024-06-04 DIAGNOSIS — C50411 Malignant neoplasm of upper-outer quadrant of right female breast: Secondary | ICD-10-CM

## 2024-06-04 DIAGNOSIS — C50012 Malignant neoplasm of nipple and areola, left female breast: Secondary | ICD-10-CM

## 2024-06-04 DIAGNOSIS — Z1231 Encounter for screening mammogram for malignant neoplasm of breast: Secondary | ICD-10-CM

## 2024-06-04 NOTE — Assessment & Plan Note (Addendum)
-   Vitamin D  level normal 34.16 (11/17/23).  - Bone scan from 10/03/2023 revealed a T-score of -1.6 with a 10-year probability of of major osteoporotic fracture 4.7% specifically hip fracture 0.8%.  No significant change from previous. - Continue calcium , vitamin D , and weightbearing exercises for breast cancer therapy associated bone loss.

## 2024-06-04 NOTE — Assessment & Plan Note (Addendum)
-   She was tolerating letrozole  reasonably well with hot flashes and night sweats 1-2 times each week at her last visit although she can now not remember if she is taking it. - Patient history/ROS negative for any red flag symptoms of recurrence. - Most recent mammogram (10/03/23) BI-RADS Category 1, negative.  No mammographic evidence of malignancy in either breast. - Labs and office visit in 6 months. - Next mammogram due April 2026.

## 2024-06-04 NOTE — Assessment & Plan Note (Deleted)
-  No evidence of recurrent disease. -Will continue to monitor.

## 2024-06-04 NOTE — Progress Notes (Unsigned)
 "  [facility] OFFICE PROGRESS NOTE  Trudy Vaughn FALCON, MD  I connected with Lisa Torres on 05/17/24 at 10:30 AM EST by telephone visit and verified that I am speaking with the correct person using two identifiers.   I discussed the limitations, risks, security and privacy concerns of performing an evaluation and management service by telemedicine and the availability of in-person appointments. I also discussed with the patient that there may be a patient responsible charge related to this service. The patient expressed understanding and agreed to proceed.   Other persons participating in the visit and their role in the encounter: NP, Patient    Patients location: Home   Providers location: Clinic    ASSESSMENT & PLAN:    Assessment & Plan Malignant neoplasm of upper-outer quadrant of right female breast, unspecified estrogen receptor status (HCC) - She has been consistently taking her letrozole  1 mg daily without significant side effects. - Patient history/ROS negative for any red flag symptoms of recurrence. - Most recent mammogram (10/03/23) BI-RADS Category 1, negative.  No mammographic evidence of malignancy in either breast. - Labs and office visit in 6 months. - Next mammogram due April 2026.  Orders placed.   Osteopenia, unspecified location - Vitamin D  level normal 46.70. - Bone scan from 10/03/2023 revealed a T-score of -1.6 with a 10-year probability of of major osteoporotic fracture 4.7% specifically hip fracture 0.8%.  No significant change from previous. - Continue calcium , vitamin D , and weightbearing exercises for breast cancer therapy associated bone loss. Malignant neoplasm of upper-outer quadrant of right breast in female, estrogen receptor positive (HCC) - She was tolerating letrozole  reasonably well with hot flashes and night sweats 1-2 times each week at her last visit although she can now not remember if she is taking it. - Patient  history/ROS negative for any red flag symptoms of recurrence. - Most recent mammogram (10/03/23) BI-RADS Category 1, negative.  No mammographic evidence of malignancy in either breast. - Labs and office visit in 6 months. - Next mammogram due April 2026.     Orders Placed This Encounter  Procedures   MM 3D SCREENING MAMMOGRAM BILATERAL BREAST    Needs assistance standing No implants [facility]    Standing Status:   Future    Expected Date:   10/03/2024    Expiration Date:   06/04/2025    Reason for Exam (SYMPTOM  OR DIAGNOSIS REQUIRED):   screening    Preferred imaging location?:   [facility]   CBC with Differential    Standing Status:   Future    Expected Date:   11/30/2024    Expiration Date:   03/08/2025   Comprehensive metabolic panel    Standing Status:   Future    Expected Date:   11/30/2024    Expiration Date:   03/08/2025   Vitamin D  25 hydroxy    Standing Status:   Future    Expected Date:   11/30/2024    Expiration Date:   03/08/2025    INTERVAL HISTORY Lisa Torres, a 78 y.o. female, returns for routine follow-up of her history of bilateral breast cancer (left breast 2013, right breast 2022).    Most recent bone density and mammogram is from 10/03/2023.  In the interim, she had an MRI of her brain for dizziness in the emergency room which did not show any acute intracranial abnormality.  She was given meclizine .  She was also found to have a UTI  and started on Keflex .  No additional UTI symptoms.  Reports since her last visit she has been taking her letrozole  as prescribed.  She denies any symptoms of recurrence such as new lumps, bone pain, chest pain, or dyspnea.  She does have some intermittent abdominal pain associated with constipation.   She has no new headaches, seizures, or focal neurologic deficits.  No B symptoms such as fever, chills, unintentional weight loss.   She is taking calcium  and vitamin D  supplements daily.   She reports 50%  energy and 50% appetite.  She is maintaining stable weight at this time.    We reviewed CBC, CMP, vitamin D .  SUMMARY OF HEMATOLOGIC HISTORY: Oncology History   No problem history exists.    1.  Stage I (PT1CPNX) right breast upper outer quadrant IDC, ER/PR positive, HER2 negative  - Screening mammogram on 08/07/2020 BI-RADS Category 0. - Right breast mammogram and ultrasound on 09/01/2020 shows hypoechoic mass with irregular shape and borders at the 10 o'clock position of the right breast, 7 cm from the nipple measuring 0.8 x 1 x 1.3 cm.  Ultrasound of the right axilla was normal. - Biopsy of the right breast 10 o'clock position consistent with IDC, ER 95% positive, PR 90% positive, Ki-67 5%, HER2 2+ by IHC, negative by FISH. - Right lumpectomy on 11/03/2020 with 1.6 cm invasive ductal carcinoma, grade 1.  Resection margins negative.  Closest margin is 1 mm, superior margin.  PT1CPNX. - Letrozole  started on 12/05/2020.   2.  Stage I left breast cancer: - Diagnosed 01/07/2012, ER/PR positive, status post lumpectomy and SLNB. - XRT completed in November 2013. - Took 5 years of anastrozole .   3.  Osteopenia - DEXA scan on 11/20/2020 with T score -1.6, osteopenia. - She is taking vitamin D  2 tablets daily   4.  Other history -PMH also significant for Parkinson's disease and hypertension - She lives with husband at home and is independent of ADLs.  Uses a wheelchair for mobility but does sometimes walk with the help of a walker.  Denies any falls. - Quit smoking in 2000.  Smoked 3 to 4 cigarettes/day for many years. - Brother had pancreatic cancer.  Mother had colon cancer.  No family history of breast cancers.   CBC    Component Value Date/Time   WBC 4.1 05/28/2024 1052   RBC 4.16 05/28/2024 1052   HGB 12.0 05/28/2024 1052   HCT 37.5 05/28/2024 1052   PLT 260 05/28/2024 1052   MCV 90.1 05/28/2024 1052   MCH 28.8 05/28/2024 1052   MCHC 32.0 05/28/2024 1052   RDW 12.2 05/28/2024 1052    LYMPHSABS 1.1 05/28/2024 1052   MONOABS 0.3 05/28/2024 1052   EOSABS 0.1 05/28/2024 1052   BASOSABS 0.0 05/28/2024 1052       Latest Ref Rng & Units 05/28/2024   10:52 AM 04/19/2024    7:28 PM 11/17/2023   12:55 PM  CMP  Glucose 70 - 99 mg/dL 99  84  97   BUN 8 - 23 mg/dL 12  11  10    Creatinine 0.44 - 1.00 mg/dL 9.23  9.17  9.27   Sodium 135 - 145 mmol/L 143  141  139   Potassium 3.5 - 5.1 mmol/L 4.0  4.2  3.7   Chloride 98 - 111 mmol/L 105  103  105   CO2 22 - 32 mmol/L 25  26  23    Calcium  8.9 - 10.3 mg/dL 89.8  9.7  9.6   Total Protein 6.5 - 8.1 g/dL 7.9  7.6  8.4   Total Bilirubin 0.0 - 1.2 mg/dL 0.5  0.4  0.7   Alkaline Phos 38 - 126 U/L 98  101  103   AST 15 - 41 U/L 21  24  19    ALT 0 - 44 U/L <5  <5  15      Lab Results  Component Value Date   VITAMINB12 1,660 (H) 04/10/2018    There were no vitals filed for this visit.  Review of System:  Review of Systems  Constitutional:  Positive for malaise/fatigue.    Physical Exam: Physical Exam Neurological:     Mental Status: She is alert and oriented to person, place, and time.      I provided 20 minutes of non face-to-face telephone visit time during this encounter, and > 50% was spent counseling as documented under my assessment & plan.   Delon Hope, NP 06/08/2024 9:12 AM "

## 2024-06-08 NOTE — Assessment & Plan Note (Signed)
-   She was tolerating letrozole  reasonably well with hot flashes and night sweats 1-2 times each week at her last visit although she can now not remember if she is taking it. - Patient history/ROS negative for any red flag symptoms of recurrence. - Most recent mammogram (10/03/23) BI-RADS Category 1, negative.  No mammographic evidence of malignancy in either breast. - Labs and office visit in 6 months. - Next mammogram due April 2026.

## 2024-06-22 ENCOUNTER — Encounter (INDEPENDENT_AMBULATORY_CARE_PROVIDER_SITE_OTHER): Payer: Self-pay | Admitting: *Deleted

## 2024-06-23 ENCOUNTER — Other Ambulatory Visit: Payer: Self-pay | Admitting: Oncology

## 2024-07-06 ENCOUNTER — Encounter (INDEPENDENT_AMBULATORY_CARE_PROVIDER_SITE_OTHER): Payer: Self-pay | Admitting: *Deleted

## 2024-07-08 ENCOUNTER — Other Ambulatory Visit: Payer: Self-pay | Admitting: *Deleted

## 2024-07-08 DIAGNOSIS — C50411 Malignant neoplasm of upper-outer quadrant of right female breast: Secondary | ICD-10-CM

## 2024-07-08 MED ORDER — LETROZOLE 2.5 MG PO TABS: 2.5000 mg | ORAL_TABLET | Freq: Every day | ORAL | 11 refills | Status: AC

## 2024-07-28 ENCOUNTER — Ambulatory Visit (INDEPENDENT_AMBULATORY_CARE_PROVIDER_SITE_OTHER): Admitting: Gastroenterology

## 2024-10-04 ENCOUNTER — Ambulatory Visit (HOSPITAL_COMMUNITY)

## 2024-11-26 ENCOUNTER — Inpatient Hospital Stay

## 2024-12-03 ENCOUNTER — Inpatient Hospital Stay: Admitting: Oncology
